# Patient Record
Sex: Female | Born: 1935 | Race: White | Hispanic: No | Smoking: Former smoker
Health system: Southern US, Community
[De-identification: ages and names within clinical notes are randomized; demographics above are authoritative.]

## PROBLEM LIST (undated history)

## (undated) DIAGNOSIS — E785 Hyperlipidemia, unspecified: Secondary | ICD-10-CM

## (undated) DIAGNOSIS — F419 Anxiety disorder, unspecified: Secondary | ICD-10-CM

## (undated) DIAGNOSIS — I10 Essential (primary) hypertension: Secondary | ICD-10-CM

## (undated) DIAGNOSIS — G8929 Other chronic pain: Secondary | ICD-10-CM

## (undated) DIAGNOSIS — E669 Obesity, unspecified: Secondary | ICD-10-CM

## (undated) DIAGNOSIS — K259 Gastric ulcer, unspecified as acute or chronic, without hemorrhage or perforation: Secondary | ICD-10-CM

## (undated) DIAGNOSIS — F329 Major depressive disorder, single episode, unspecified: Secondary | ICD-10-CM

## (undated) DIAGNOSIS — F32A Depression, unspecified: Secondary | ICD-10-CM

## (undated) DIAGNOSIS — F0390 Unspecified dementia without behavioral disturbance: Secondary | ICD-10-CM

## (undated) HISTORY — DX: Obesity, unspecified: E66.9

## (undated) HISTORY — DX: Major depressive disorder, single episode, unspecified: F32.9

## (undated) HISTORY — PX: OTHER SURGICAL HISTORY: SHX169

## (undated) HISTORY — PX: BILATERAL OOPHORECTOMY: SHX1221

## (undated) HISTORY — DX: Anxiety disorder, unspecified: F41.9

## (undated) HISTORY — DX: Depression, unspecified: F32.A

## (undated) HISTORY — DX: Essential (primary) hypertension: I10

## (undated) HISTORY — DX: Hyperlipidemia, unspecified: E78.5

## (undated) HISTORY — PX: ABDOMINAL HYSTERECTOMY: SHX81

---

## 1998-07-19 ENCOUNTER — Other Ambulatory Visit: Admission: RE | Admit: 1998-07-19 | Discharge: 1998-07-19 | Payer: Self-pay | Admitting: Obstetrics and Gynecology

## 1999-07-16 ENCOUNTER — Other Ambulatory Visit: Admission: RE | Admit: 1999-07-16 | Discharge: 1999-07-16 | Payer: Self-pay | Admitting: Obstetrics and Gynecology

## 2000-07-08 ENCOUNTER — Ambulatory Visit (HOSPITAL_COMMUNITY): Admission: RE | Admit: 2000-07-08 | Discharge: 2000-07-08 | Payer: Self-pay | Admitting: Gastroenterology

## 2000-07-08 ENCOUNTER — Encounter (INDEPENDENT_AMBULATORY_CARE_PROVIDER_SITE_OTHER): Payer: Self-pay | Admitting: Specialist

## 2000-09-08 ENCOUNTER — Other Ambulatory Visit: Admission: RE | Admit: 2000-09-08 | Discharge: 2000-09-08 | Payer: Self-pay | Admitting: Obstetrics and Gynecology

## 2001-09-13 ENCOUNTER — Other Ambulatory Visit: Admission: RE | Admit: 2001-09-13 | Discharge: 2001-09-13 | Payer: Self-pay | Admitting: Obstetrics and Gynecology

## 2001-11-10 ENCOUNTER — Ambulatory Visit (HOSPITAL_COMMUNITY): Admission: RE | Admit: 2001-11-10 | Discharge: 2001-11-10 | Payer: Self-pay | Admitting: Gastroenterology

## 2001-11-10 ENCOUNTER — Encounter (INDEPENDENT_AMBULATORY_CARE_PROVIDER_SITE_OTHER): Payer: Self-pay | Admitting: Specialist

## 2001-12-15 ENCOUNTER — Ambulatory Visit: Admission: RE | Admit: 2001-12-15 | Discharge: 2001-12-15 | Payer: Self-pay | Admitting: Gynecology

## 2002-11-01 ENCOUNTER — Other Ambulatory Visit: Admission: RE | Admit: 2002-11-01 | Discharge: 2002-11-01 | Payer: Self-pay | Admitting: Obstetrics and Gynecology

## 2003-07-04 ENCOUNTER — Encounter (INDEPENDENT_AMBULATORY_CARE_PROVIDER_SITE_OTHER): Payer: Self-pay

## 2003-07-04 ENCOUNTER — Ambulatory Visit (HOSPITAL_COMMUNITY): Admission: RE | Admit: 2003-07-04 | Discharge: 2003-07-04 | Payer: Self-pay | Admitting: Gastroenterology

## 2004-07-10 ENCOUNTER — Ambulatory Visit: Payer: Self-pay | Admitting: Internal Medicine

## 2004-07-18 ENCOUNTER — Ambulatory Visit: Payer: Self-pay | Admitting: Internal Medicine

## 2004-09-03 ENCOUNTER — Ambulatory Visit: Payer: Self-pay | Admitting: Family Medicine

## 2004-10-18 ENCOUNTER — Ambulatory Visit: Payer: Self-pay | Admitting: Internal Medicine

## 2004-11-08 ENCOUNTER — Ambulatory Visit: Payer: Self-pay | Admitting: Internal Medicine

## 2004-11-11 ENCOUNTER — Ambulatory Visit: Payer: Self-pay | Admitting: Internal Medicine

## 2004-12-04 ENCOUNTER — Ambulatory Visit (HOSPITAL_COMMUNITY): Admission: RE | Admit: 2004-12-04 | Discharge: 2004-12-04 | Payer: Self-pay | Admitting: Gastroenterology

## 2004-12-04 ENCOUNTER — Encounter (INDEPENDENT_AMBULATORY_CARE_PROVIDER_SITE_OTHER): Payer: Self-pay | Admitting: Specialist

## 2005-02-11 ENCOUNTER — Ambulatory Visit: Payer: Self-pay | Admitting: Internal Medicine

## 2005-04-01 ENCOUNTER — Ambulatory Visit: Payer: Self-pay | Admitting: Internal Medicine

## 2005-07-03 ENCOUNTER — Ambulatory Visit: Payer: Self-pay | Admitting: Internal Medicine

## 2005-07-11 ENCOUNTER — Ambulatory Visit: Payer: Self-pay | Admitting: Internal Medicine

## 2005-07-31 ENCOUNTER — Ambulatory Visit: Payer: Self-pay | Admitting: Internal Medicine

## 2005-08-11 ENCOUNTER — Ambulatory Visit: Payer: Self-pay | Admitting: Internal Medicine

## 2005-09-08 ENCOUNTER — Encounter (HOSPITAL_COMMUNITY): Admission: RE | Admit: 2005-09-08 | Discharge: 2005-12-07 | Payer: Self-pay | Admitting: Internal Medicine

## 2005-10-06 ENCOUNTER — Ambulatory Visit: Payer: Self-pay | Admitting: Internal Medicine

## 2005-10-10 ENCOUNTER — Ambulatory Visit: Payer: Self-pay | Admitting: Internal Medicine

## 2005-11-14 ENCOUNTER — Ambulatory Visit: Payer: Self-pay | Admitting: Internal Medicine

## 2006-01-08 ENCOUNTER — Ambulatory Visit: Payer: Self-pay | Admitting: Internal Medicine

## 2006-01-14 ENCOUNTER — Ambulatory Visit: Payer: Self-pay | Admitting: Internal Medicine

## 2006-02-02 ENCOUNTER — Ambulatory Visit (HOSPITAL_COMMUNITY): Admission: RE | Admit: 2006-02-02 | Discharge: 2006-02-03 | Payer: Self-pay | Admitting: Surgery

## 2006-02-02 ENCOUNTER — Encounter (INDEPENDENT_AMBULATORY_CARE_PROVIDER_SITE_OTHER): Payer: Self-pay | Admitting: Specialist

## 2006-02-16 ENCOUNTER — Ambulatory Visit: Payer: Self-pay | Admitting: Internal Medicine

## 2006-04-08 ENCOUNTER — Ambulatory Visit: Payer: Self-pay | Admitting: Internal Medicine

## 2006-06-03 ENCOUNTER — Ambulatory Visit: Payer: Self-pay | Admitting: Internal Medicine

## 2006-06-10 ENCOUNTER — Ambulatory Visit: Payer: Self-pay | Admitting: Internal Medicine

## 2006-06-10 LAB — CONVERTED CEMR LAB: TSH: 1.16 microintl units/mL (ref 0.35–5.50)

## 2006-08-10 ENCOUNTER — Ambulatory Visit: Payer: Self-pay | Admitting: Internal Medicine

## 2006-08-10 LAB — CONVERTED CEMR LAB
ALT: 17 units/L (ref 0–40)
AST: 21 units/L (ref 0–37)
Albumin: 4.1 g/dL (ref 3.5–5.2)
BUN: 15 mg/dL (ref 6–23)
Basophils Relative: 0.1 % (ref 0.0–1.0)
CO2: 30 meq/L (ref 19–32)
Calcium: 10.2 mg/dL (ref 8.4–10.5)
Eosinophil percent: 1.4 % (ref 0.0–5.0)
Glomerular Filtration Rate, Af Am: 70 mL/min/{1.73_m2}
HCT: 46.1 % — ABNORMAL HIGH (ref 36.0–46.0)
Hemoglobin: 15.3 g/dL — ABNORMAL HIGH (ref 12.0–15.0)
Lymphocytes Relative: 26.7 % (ref 12.0–46.0)
MCV: 89.1 fL (ref 78.0–100.0)
Monocytes Relative: 8.2 % (ref 3.0–11.0)
Neutrophils Relative %: 63.6 % (ref 43.0–77.0)
RBC: 5.17 M/uL — ABNORMAL HIGH (ref 3.87–5.11)
RDW: 12.6 % (ref 11.5–14.6)
WBC: 6.8 10*3/uL (ref 4.5–10.5)

## 2006-08-17 ENCOUNTER — Ambulatory Visit: Payer: Self-pay | Admitting: Internal Medicine

## 2006-10-22 ENCOUNTER — Ambulatory Visit: Payer: Self-pay | Admitting: Internal Medicine

## 2006-10-24 ENCOUNTER — Ambulatory Visit: Payer: Self-pay | Admitting: Internal Medicine

## 2006-10-29 ENCOUNTER — Ambulatory Visit: Payer: Self-pay | Admitting: Internal Medicine

## 2006-12-01 ENCOUNTER — Ambulatory Visit: Payer: Self-pay | Admitting: Internal Medicine

## 2007-02-02 ENCOUNTER — Ambulatory Visit: Payer: Self-pay | Admitting: Internal Medicine

## 2007-02-23 DIAGNOSIS — E785 Hyperlipidemia, unspecified: Secondary | ICD-10-CM | POA: Insufficient documentation

## 2007-03-04 ENCOUNTER — Ambulatory Visit: Payer: Self-pay | Admitting: Internal Medicine

## 2007-03-11 ENCOUNTER — Encounter: Admission: RE | Admit: 2007-03-11 | Discharge: 2007-03-11 | Payer: Self-pay | Admitting: Obstetrics and Gynecology

## 2007-05-04 ENCOUNTER — Ambulatory Visit: Payer: Self-pay | Admitting: Internal Medicine

## 2007-05-04 LAB — CONVERTED CEMR LAB
AST: 21 units/L (ref 0–37)
Albumin: 3.8 g/dL (ref 3.5–5.2)
Alkaline Phosphatase: 59 units/L (ref 39–117)
Cholesterol: 274 mg/dL (ref 0–200)
Total Bilirubin: 0.9 mg/dL (ref 0.3–1.2)
Total CHOL/HDL Ratio: 5.2
VLDL: 24 mg/dL (ref 0–40)

## 2007-05-10 ENCOUNTER — Ambulatory Visit: Payer: Self-pay | Admitting: Internal Medicine

## 2007-05-10 DIAGNOSIS — I10 Essential (primary) hypertension: Secondary | ICD-10-CM | POA: Insufficient documentation

## 2007-05-10 DIAGNOSIS — T887XXA Unspecified adverse effect of drug or medicament, initial encounter: Secondary | ICD-10-CM

## 2007-05-11 ENCOUNTER — Ambulatory Visit: Admission: RE | Admit: 2007-05-11 | Discharge: 2007-05-11 | Payer: Self-pay | Admitting: Gynecology

## 2007-07-15 ENCOUNTER — Ambulatory Visit: Payer: Self-pay | Admitting: Internal Medicine

## 2007-07-15 LAB — CONVERTED CEMR LAB
ALT: 17 units/L (ref 0–35)
AST: 21 units/L (ref 0–37)
Albumin: 4.2 g/dL (ref 3.5–5.2)
VLDL: 34 mg/dL (ref 0–40)

## 2007-07-21 ENCOUNTER — Telehealth: Payer: Self-pay | Admitting: Internal Medicine

## 2007-07-22 ENCOUNTER — Ambulatory Visit: Payer: Self-pay | Admitting: Internal Medicine

## 2007-08-03 ENCOUNTER — Encounter: Payer: Self-pay | Admitting: Internal Medicine

## 2007-08-05 ENCOUNTER — Telehealth: Payer: Self-pay | Admitting: Internal Medicine

## 2007-08-18 ENCOUNTER — Telehealth: Payer: Self-pay | Admitting: Internal Medicine

## 2007-08-18 DIAGNOSIS — Z780 Asymptomatic menopausal state: Secondary | ICD-10-CM | POA: Insufficient documentation

## 2007-08-25 ENCOUNTER — Encounter: Payer: Self-pay | Admitting: Internal Medicine

## 2007-09-28 ENCOUNTER — Telehealth: Payer: Self-pay | Admitting: Internal Medicine

## 2007-10-15 ENCOUNTER — Ambulatory Visit: Payer: Self-pay | Admitting: Internal Medicine

## 2007-10-15 LAB — CONVERTED CEMR LAB
ALT: 25 units/L (ref 0–35)
Albumin: 4.4 g/dL (ref 3.5–5.2)
BUN: 12 mg/dL (ref 6–23)
Basophils Relative: 0.2 % (ref 0.0–1.0)
Blood in Urine, dipstick: NEGATIVE
Cholesterol: 221 mg/dL (ref 0–200)
Creatinine, Ser: 0.9 mg/dL (ref 0.4–1.2)
Eosinophils Relative: 2.7 % (ref 0.0–5.0)
GFR calc non Af Amer: 65 mL/min
Glucose, Bld: 98 mg/dL (ref 70–99)
Glucose, Urine, Semiquant: NEGATIVE
HCT: 47.4 % — ABNORMAL HIGH (ref 36.0–46.0)
HDL: 66.7 mg/dL (ref >39.0)
MCV: 88.7 fL (ref 78.0–100.0)
Monocytes Absolute: 0.5 10*3/uL (ref 0.2–0.7)
Neutro Abs: 4.8 10*3/uL (ref 1.4–7.7)
Neutrophils Relative %: 64.8 % (ref 43.0–77.0)
Nitrite: NEGATIVE
Protein, U semiquant: NEGATIVE
RBC: 5.34 M/uL — ABNORMAL HIGH (ref 3.87–5.11)
Sodium: 145 meq/L (ref 135–145)
VLDL: 31 mg/dL (ref 0–40)
WBC Urine, dipstick: NEGATIVE
WBC: 7.4 10*3/uL (ref 4.5–10.5)

## 2007-10-22 ENCOUNTER — Ambulatory Visit: Payer: Self-pay | Admitting: Internal Medicine

## 2007-10-22 LAB — CONVERTED CEMR LAB
Cholesterol, target level: 200 mg/dL
HDL goal, serum: 40 mg/dL
LDL Goal: 160 mg/dL

## 2007-11-16 ENCOUNTER — Telehealth: Payer: Self-pay | Admitting: Internal Medicine

## 2007-11-16 ENCOUNTER — Ambulatory Visit: Admission: RE | Admit: 2007-11-16 | Discharge: 2007-11-16 | Payer: Self-pay | Admitting: Gynecology

## 2007-11-23 ENCOUNTER — Ambulatory Visit: Payer: Self-pay | Admitting: Internal Medicine

## 2007-11-23 DIAGNOSIS — K120 Recurrent oral aphthae: Secondary | ICD-10-CM

## 2007-11-23 DIAGNOSIS — E538 Deficiency of other specified B group vitamins: Secondary | ICD-10-CM | POA: Insufficient documentation

## 2007-11-29 ENCOUNTER — Encounter: Admission: RE | Admit: 2007-11-29 | Discharge: 2007-11-29 | Payer: Self-pay | Admitting: Gynecology

## 2007-12-01 ENCOUNTER — Encounter: Payer: Self-pay | Admitting: Gynecology

## 2007-12-01 ENCOUNTER — Ambulatory Visit (HOSPITAL_BASED_OUTPATIENT_CLINIC_OR_DEPARTMENT_OTHER): Admission: RE | Admit: 2007-12-01 | Discharge: 2007-12-01 | Payer: Self-pay | Admitting: Gynecology

## 2007-12-29 ENCOUNTER — Telehealth: Payer: Self-pay | Admitting: Internal Medicine

## 2007-12-30 DIAGNOSIS — R040 Epistaxis: Secondary | ICD-10-CM

## 2008-01-05 ENCOUNTER — Ambulatory Visit: Admission: RE | Admit: 2008-01-05 | Discharge: 2008-01-05 | Payer: Self-pay | Admitting: Gynecology

## 2008-01-20 ENCOUNTER — Ambulatory Visit: Payer: Self-pay | Admitting: Internal Medicine

## 2008-01-20 LAB — CONVERTED CEMR LAB
CO2: 29 meq/L (ref 19–32)
Creatinine, Ser: 0.8 mg/dL (ref 0.4–1.2)
GFR calc non Af Amer: 74 mL/min
Potassium: 4.3 meq/L (ref 3.5–5.1)

## 2008-01-21 ENCOUNTER — Encounter: Payer: Self-pay | Admitting: Internal Medicine

## 2008-01-21 LAB — CONVERTED CEMR LAB
Albumin ELP: 62.4 % (ref 55.8–66.1)
Alpha-1-Globulin: 4 % (ref 2.9–4.9)
Alpha-2-Globulin: 8.6 % (ref 7.1–11.8)
Gamma Globulin: 13.7 % (ref 11.1–18.8)

## 2008-01-27 ENCOUNTER — Telehealth (INDEPENDENT_AMBULATORY_CARE_PROVIDER_SITE_OTHER): Payer: Self-pay | Admitting: *Deleted

## 2008-03-14 ENCOUNTER — Ambulatory Visit: Payer: Self-pay | Admitting: Internal Medicine

## 2008-03-14 LAB — CONVERTED CEMR LAB
ALT: 19 units/L (ref 0–35)
Albumin: 4.1 g/dL (ref 3.5–5.2)
Cholesterol: 298 mg/dL (ref 0–200)
HDL: 51 mg/dL (ref >39.0)
Total CHOL/HDL Ratio: 5.8
Total Protein: 6.8 g/dL (ref 6.0–8.3)

## 2008-03-16 ENCOUNTER — Telehealth: Payer: Self-pay | Admitting: Internal Medicine

## 2008-03-21 ENCOUNTER — Ambulatory Visit: Payer: Self-pay | Admitting: Internal Medicine

## 2008-05-05 ENCOUNTER — Ambulatory Visit: Payer: Self-pay | Admitting: Internal Medicine

## 2008-05-05 DIAGNOSIS — S2000XA Contusion of breast, unspecified breast, initial encounter: Secondary | ICD-10-CM | POA: Insufficient documentation

## 2008-05-16 ENCOUNTER — Ambulatory Visit: Payer: Self-pay | Admitting: Internal Medicine

## 2008-05-16 DIAGNOSIS — R000 Tachycardia, unspecified: Secondary | ICD-10-CM

## 2008-05-17 ENCOUNTER — Telehealth: Payer: Self-pay | Admitting: Internal Medicine

## 2008-06-02 ENCOUNTER — Ambulatory Visit: Payer: Self-pay | Admitting: Internal Medicine

## 2008-06-16 ENCOUNTER — Ambulatory Visit: Payer: Self-pay | Admitting: Internal Medicine

## 2008-06-16 LAB — CONVERTED CEMR LAB
ALT: 19 units/L (ref 0–35)
AST: 21 units/L (ref 0–37)
Alkaline Phosphatase: 71 units/L (ref 39–117)
Cholesterol: 331 mg/dL (ref 0–200)
Direct LDL: 205.6 mg/dL
Total Bilirubin: 1.1 mg/dL (ref 0.3–1.2)
Triglycerides: 272 mg/dL (ref 0–149)

## 2008-06-23 ENCOUNTER — Ambulatory Visit: Payer: Self-pay | Admitting: Internal Medicine

## 2008-07-21 LAB — CONVERTED CEMR LAB: Pap Smear: NORMAL

## 2008-08-28 ENCOUNTER — Encounter: Payer: Self-pay | Admitting: Internal Medicine

## 2008-10-05 ENCOUNTER — Ambulatory Visit: Payer: Self-pay | Admitting: Internal Medicine

## 2008-10-05 LAB — CONVERTED CEMR LAB
ALT: 20 units/L (ref 0–35)
AST: 24 units/L (ref 0–37)
Bilirubin, Direct: 0.1 mg/dL (ref 0.0–0.3)
Chloride: 110 meq/L (ref 96–112)
Cholesterol: 151 mg/dL (ref 0–200)
Creatinine, Ser: 0.9 mg/dL (ref 0.4–1.2)
LDL Cholesterol: 76 mg/dL (ref 0–99)
Potassium: 4.5 meq/L (ref 3.5–5.1)
Sodium: 144 meq/L (ref 135–145)
Total Bilirubin: 0.8 mg/dL (ref 0.3–1.2)
Total Protein: 7.2 g/dL (ref 6.0–8.3)
VLDL: 18 mg/dL (ref 0–40)

## 2008-10-12 ENCOUNTER — Ambulatory Visit: Payer: Self-pay | Admitting: Internal Medicine

## 2008-10-16 ENCOUNTER — Telehealth: Payer: Self-pay | Admitting: Internal Medicine

## 2008-11-09 ENCOUNTER — Telehealth: Payer: Self-pay | Admitting: Internal Medicine

## 2008-11-17 ENCOUNTER — Ambulatory Visit: Payer: Self-pay | Admitting: Internal Medicine

## 2008-11-17 ENCOUNTER — Telehealth: Payer: Self-pay | Admitting: Internal Medicine

## 2008-11-17 DIAGNOSIS — J3089 Other allergic rhinitis: Secondary | ICD-10-CM

## 2008-11-20 ENCOUNTER — Encounter: Payer: Self-pay | Admitting: Internal Medicine

## 2009-02-19 ENCOUNTER — Ambulatory Visit: Payer: Self-pay | Admitting: Internal Medicine

## 2009-02-19 LAB — CONVERTED CEMR LAB
AST: 22 units/L (ref 0–37)
Albumin: 4 g/dL (ref 3.5–5.2)
Alkaline Phosphatase: 65 units/L (ref 39–117)
BUN: 16 mg/dL (ref 6–23)
Basophils Absolute: 0 10*3/uL (ref 0.0–0.1)
Basophils Relative: 0 % (ref 0.0–3.0)
Calcium: 9.6 mg/dL (ref 8.4–10.5)
Chloride: 105 meq/L (ref 96–112)
Creatinine, Ser: 0.7 mg/dL (ref 0.4–1.2)
Eosinophils Relative: 2.7 % (ref 0.0–5.0)
GFR calc non Af Amer: 86.17 mL/min (ref >60)
Glucose, Bld: 102 mg/dL — ABNORMAL HIGH (ref 70–99)
HDL: 50.6 mg/dL (ref >39.00)
Lymphocytes Relative: 25.4 % (ref 12.0–46.0)
Lymphs Abs: 1.7 10*3/uL (ref 0.7–4.0)
Monocytes Relative: 8 % (ref 3.0–12.0)
Neutro Abs: 4.2 10*3/uL (ref 1.4–7.7)
Neutrophils Relative %: 63.9 % (ref 43.0–77.0)
Platelets: 257 10*3/uL (ref 150.0–400.0)
Sodium: 145 meq/L (ref 135–145)
Total CHOL/HDL Ratio: 6
VLDL: 43.6 mg/dL — ABNORMAL HIGH (ref 0.0–40.0)
WBC: 6.6 10*3/uL (ref 4.5–10.5)

## 2009-06-04 ENCOUNTER — Ambulatory Visit: Payer: Self-pay | Admitting: Internal Medicine

## 2009-06-04 LAB — CONVERTED CEMR LAB
ALT: 16 units/L (ref 0–35)
Albumin: 3.8 g/dL (ref 3.5–5.2)
Alkaline Phosphatase: 62 units/L (ref 39–117)
Bilirubin, Direct: 0 mg/dL (ref 0.0–0.3)
CO2: 31 meq/L (ref 19–32)
Calcium: 9.7 mg/dL (ref 8.4–10.5)
Cholesterol: 284 mg/dL — ABNORMAL HIGH (ref 0–200)
Creatinine, Ser: 0.8 mg/dL (ref 0.4–1.2)
Direct LDL: 201.3 mg/dL
Glucose, Bld: 91 mg/dL (ref 70–99)
HDL: 44 mg/dL (ref >39.00)
Potassium: 4.1 meq/L (ref 3.5–5.1)

## 2009-06-11 ENCOUNTER — Ambulatory Visit: Payer: Self-pay | Admitting: Internal Medicine

## 2009-06-20 ENCOUNTER — Ambulatory Visit: Payer: Self-pay | Admitting: Internal Medicine

## 2009-06-20 ENCOUNTER — Encounter: Payer: Self-pay | Admitting: Internal Medicine

## 2009-06-20 DIAGNOSIS — L723 Sebaceous cyst: Secondary | ICD-10-CM

## 2009-06-20 DIAGNOSIS — F411 Generalized anxiety disorder: Secondary | ICD-10-CM | POA: Insufficient documentation

## 2009-07-23 ENCOUNTER — Telehealth: Payer: Self-pay | Admitting: Internal Medicine

## 2009-09-18 ENCOUNTER — Ambulatory Visit: Payer: Self-pay | Admitting: Internal Medicine

## 2009-09-18 LAB — CONVERTED CEMR LAB
Alkaline Phosphatase: 63 units/L (ref 39–117)
Bilirubin, Direct: 0.1 mg/dL (ref 0.0–0.3)
HDL: 53.7 mg/dL (ref >39.00)
Total Bilirubin: 0.8 mg/dL (ref 0.3–1.2)
Triglycerides: 202 mg/dL — ABNORMAL HIGH (ref 0.0–149.0)
VLDL: 40.4 mg/dL — ABNORMAL HIGH (ref 0.0–40.0)

## 2009-09-25 ENCOUNTER — Ambulatory Visit: Payer: Self-pay | Admitting: Internal Medicine

## 2009-10-23 ENCOUNTER — Encounter: Payer: Self-pay | Admitting: Internal Medicine

## 2009-11-30 ENCOUNTER — Ambulatory Visit: Payer: Self-pay | Admitting: Internal Medicine

## 2009-11-30 LAB — CONVERTED CEMR LAB
Bilirubin, Direct: 0 mg/dL (ref 0.0–0.3)
Direct LDL: 211.4 mg/dL
HDL: 58.4 mg/dL (ref >39.00)
Total Bilirubin: 0.7 mg/dL (ref 0.3–1.2)
Total CHOL/HDL Ratio: 5
Triglycerides: 199 mg/dL — ABNORMAL HIGH (ref 0.0–149.0)

## 2009-12-11 ENCOUNTER — Encounter: Payer: Self-pay | Admitting: Internal Medicine

## 2009-12-18 ENCOUNTER — Telehealth: Payer: Self-pay | Admitting: Internal Medicine

## 2009-12-18 ENCOUNTER — Ambulatory Visit: Payer: Self-pay | Admitting: Internal Medicine

## 2010-02-25 ENCOUNTER — Ambulatory Visit: Payer: Self-pay | Admitting: Family Medicine

## 2010-02-25 ENCOUNTER — Telehealth: Payer: Self-pay | Admitting: Internal Medicine

## 2010-02-25 DIAGNOSIS — S139XXA Sprain of joints and ligaments of unspecified parts of neck, initial encounter: Secondary | ICD-10-CM | POA: Insufficient documentation

## 2010-02-28 ENCOUNTER — Telehealth: Payer: Self-pay | Admitting: Family Medicine

## 2010-03-01 ENCOUNTER — Ambulatory Visit: Payer: Self-pay

## 2010-03-01 ENCOUNTER — Ambulatory Visit: Payer: Self-pay | Admitting: Family Medicine

## 2010-03-01 DIAGNOSIS — M79609 Pain in unspecified limb: Secondary | ICD-10-CM

## 2010-03-26 ENCOUNTER — Telehealth: Payer: Self-pay | Admitting: *Deleted

## 2010-04-10 ENCOUNTER — Ambulatory Visit: Payer: Self-pay | Admitting: Internal Medicine

## 2010-04-10 DIAGNOSIS — M1710 Unilateral primary osteoarthritis, unspecified knee: Secondary | ICD-10-CM | POA: Insufficient documentation

## 2010-04-15 ENCOUNTER — Telehealth: Payer: Self-pay | Admitting: Internal Medicine

## 2010-04-15 ENCOUNTER — Ambulatory Visit: Payer: Self-pay | Admitting: Internal Medicine

## 2010-04-15 DIAGNOSIS — M25519 Pain in unspecified shoulder: Secondary | ICD-10-CM

## 2010-04-17 ENCOUNTER — Telehealth (INDEPENDENT_AMBULATORY_CARE_PROVIDER_SITE_OTHER): Payer: Self-pay | Admitting: *Deleted

## 2010-05-15 ENCOUNTER — Ambulatory Visit: Payer: Self-pay | Admitting: Internal Medicine

## 2010-05-15 LAB — CONVERTED CEMR LAB
ALT: 15 units/L (ref 0–35)
Basophils Absolute: 0 10*3/uL (ref 0.0–0.1)
Bilirubin Urine: NEGATIVE
CO2: 29 meq/L (ref 19–32)
Chloride: 107 meq/L (ref 96–112)
GFR calc non Af Amer: 66.83 mL/min (ref >60)
Glucose, Urine, Semiquant: NEGATIVE
HCT: 37.9 % (ref 36.0–46.0)
Hemoglobin: 12.9 g/dL (ref 12.0–15.0)
Ketones, urine, test strip: NEGATIVE
MCHC: 34 g/dL (ref 30.0–36.0)
MCV: 84.7 fL (ref 78.0–100.0)
Monocytes Relative: 8.4 % (ref 3.0–12.0)
Neutro Abs: 3.3 10*3/uL (ref 1.4–7.7)
Neutrophils Relative %: 59.7 % (ref 43.0–77.0)
Nitrite: NEGATIVE
Potassium: 4.6 meq/L (ref 3.5–5.1)
Protein, U semiquant: NEGATIVE
RDW: 16.1 % — ABNORMAL HIGH (ref 11.5–14.6)
Sodium: 143 meq/L (ref 135–145)
Total Bilirubin: 0.6 mg/dL (ref 0.3–1.2)
Total CHOL/HDL Ratio: 6
Total Protein: 6.2 g/dL (ref 6.0–8.3)
Urobilinogen, UA: 0.2
VLDL: 45 mg/dL — ABNORMAL HIGH (ref 0.0–40.0)
pH: 7.5

## 2010-05-22 ENCOUNTER — Ambulatory Visit: Payer: Self-pay | Admitting: Internal Medicine

## 2010-05-27 ENCOUNTER — Encounter: Payer: Self-pay | Admitting: Internal Medicine

## 2010-05-30 ENCOUNTER — Encounter: Payer: Self-pay | Admitting: Internal Medicine

## 2010-06-24 ENCOUNTER — Encounter: Payer: Self-pay | Admitting: Internal Medicine

## 2010-07-15 ENCOUNTER — Ambulatory Visit: Payer: Self-pay | Admitting: Internal Medicine

## 2010-07-15 LAB — CONVERTED CEMR LAB
ALT: 16 units/L (ref 0–35)
Albumin: 4.1 g/dL (ref 3.5–5.2)
Alkaline Phosphatase: 56 units/L (ref 39–117)
HDL: 50.7 mg/dL (ref >39.00)
Total Bilirubin: 0.5 mg/dL (ref 0.3–1.2)
VLDL: 29.6 mg/dL (ref 0.0–40.0)

## 2010-07-24 ENCOUNTER — Ambulatory Visit: Payer: Self-pay | Admitting: Internal Medicine

## 2010-08-19 ENCOUNTER — Encounter: Payer: Self-pay | Admitting: Internal Medicine

## 2010-10-01 NOTE — Miscellaneous (Signed)
Summary: Progress Note/[facility]  Progress Note/ Physical Therapy   Imported By: Maryln Gottron 06/12/2010 14:14:34  _____________________________________________________________________  External Attachment:    Type:   Image     Comment:   External Document
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
## 2010-10-01 NOTE — Progress Notes (Signed)
  Phone Note Call from Patient   Reason for Call: Acute Illness Summary of Call: pt called stating she could not afford welchol >200  dollars- wants to continue on the livalo and take as often as possible-left message on machine for pt-this is ok per dr Lovell Sheehan Initial call taken by: Willy Eddy, LPN,  December 18, 2009 5:27 PM

## 2010-10-01 NOTE — Letter (Signed)
Summary: [facility]  [facility]   Imported By: Maryln Gottron 05/30/2010 14:49:11  _____________________________________________________________________  External Attachment:    Type:   Image     Comment:   External Document  Appended Document: [facility]

## 2010-10-01 NOTE — Assessment & Plan Note (Signed)
Summary: 3 month rov/njr   Vital Signs:  Patient profile:   77 year old female Height:      63 inches Weight:      166 pounds BMI:     29.51 Temp:     98.2 degrees F oral Pulse rate:   72 / minute Resp:     14 per minute BP sitting:   140 / 80  (left arm)  Vitals Entered By: Willy Eddy, LPN (September 25, 2009 9:20 AM) CC: roas labs-time for bone density   CC:  roas labs-time for bone density.  History of Present Illness: allergies are increased non compliant with livalo  Hyperlipidemia Follow-Up      This is a 77 year old woman who presents for Hyperlipidemia follow-up.  The patient denies muscle aches, GI upset, abdominal pain, flushing, itching, constipation, diarrhea, and fatigue.  The patient denies the following symptoms: chest pain/pressure, exercise intolerance, dypsnea, palpitations, syncope, and pedal edema.  Compliance with medications (by patient report) has been poor.  Dietary compliance has been fair.  The patient reports exercising occasionally.  Adjunctive measures currently used by the patient include fish oil supplements.    Hypertension Follow-Up      The patient also presents for Hypertension follow-up.  The patient denies lightheadedness, urinary frequency, headaches, edema, impotence, rash, and fatigue.  Compliance with medications (by patient report) has been near 100%.  The patient reports that dietary compliance has been fair.  The patient reports exercising occasionally.    Preventive Screening-Counseling & Management  Alcohol-Tobacco     Smoking Status: never  Problems Prior to Update: 1)  Anxiety State, Unspecified  (ICD-300.00) 2)  Sebaceous Cyst, Infected  (ICD-706.2) 3)  Allergic Rhinitis Due To Other Allergen  (ICD-477.8) 4)  Tachycardia  (ICD-785.0) 5)  Contusion of Breast  (ICD-922.0) 6)  Hypercalcemia  (ICD-275.42) 7)  Epistaxis  (ICD-784.7) 8)  Vitamin B12 Deficiency  (ICD-266.2) 9)  Aphthous Ulcers  (ICD-528.2) 10)  Preventive  Health Care  (ICD-V70.0) 11)  Asymptomatic Postmenopausal Status  (ICD-V49.81) 12)  Hypertension  (ICD-401.9) 13)  Advef, Drug/medicinal/biological Subst Nos  (ICD-995.20) 14)  Hyperlipidemia  (ICD-272.4)  Medications Prior to Update: 1)  Aspirin 81 Mg  Tabs (Aspirin) .... Once Daily 2)  Vitamin B-12 500 Mcg  Tabs (Cyanocobalamin) .... Once Daily 3)  Vitamin D 1000 Unit Caps (Cholecalciferol) .... Once Daily 4)  Livalo 4 Mg Tabs (Pitavastatin Calcium) .... One By Mouth  Every Sunday Night ( First Pill Tonight) 5)  Benazepril Hcl 40 Mg  Tabs (Benazepril Hcl) .... Once Daily 6)  Bisoprolol Fumarate 5 Mg Tabs (Bisoprolol Fumarate) .... 1/2 By Mouth Daily 7)  Cephalexin 500 Mg Caps (Cephalexin) .... One By Mouth  Qid For 7 Days  Current Medications (verified): 1)  Aspirin 81 Mg  Tabs (Aspirin) .... Once Daily 2)  Vitamin B-12 500 Mcg  Tabs (Cyanocobalamin) .... Once Daily 3)  Vitamin D 1000 Unit Caps (Cholecalciferol) .... Once Daily 4)  Livalo 4 Mg Tabs (Pitavastatin Calcium) .... One By Mouth  Every Sunday Night ( First Pill Tonight) 5)  Benazepril Hcl 40 Mg  Tabs (Benazepril Hcl) .... Once Daily 6)  Bisoprolol Fumarate 5 Mg Tabs (Bisoprolol Fumarate) .... 1/2 By Mouth Daily 7)  Magnesium 500 Mg Tabs (Magnesium) .Marland Kitchen.. 1 Once Daily  Allergies (verified): No Known Drug Allergies  Past History:  Family History: Last updated: 10/22/2007 Family History High cholesterol Family History Hypertension  Social History: Last updated: 10/22/2007 Never Smoked Single  widowed  Risk Factors: Smoking Status: never (09/25/2009)  Past medical, surgical, family and social histories (including risk factors) reviewed, and no changes noted (except as noted below).  Past Medical History: Reviewed history from 05/10/2007 and no changes required. Hyperlipidemia Hypertension  Past Surgical History: Reviewed history from 06/20/2009 and no changes required. BCCA of nose  Family  History: Reviewed history from 10/22/2007 and no changes required. Family History High cholesterol Family History Hypertension  Social History: Reviewed history from 10/22/2007 and no changes required. Never Smoked Single  widowed  Review of Systems  The patient denies anorexia, fever, weight loss, weight gain, vision loss, decreased hearing, hoarseness, chest pain, syncope, dyspnea on exertion, peripheral edema, prolonged cough, headaches, hemoptysis, abdominal pain, melena, hematochezia, severe indigestion/heartburn, hematuria, incontinence, genital sores, muscle weakness, suspicious skin lesions, transient blindness, difficulty walking, depression, unusual weight change, abnormal bleeding, enlarged lymph nodes, angioedema, and breast masses.    Physical Exam  General:  Well-developed,well-nourished,in no acute distress; alert,appropriate and cooperative throughout examination Head:  normocephalic and atraumatic.   Eyes:  pupils equal, pupils round, and pupils reactive to light.   Ears:  R ear normal and L ear normal.   Nose:  no nasal discharge.   HAs a cyst on the bridge of nose about 2 cm from the surgical scar Mouth:  posterior lymphoid hypertrophy and postnasal drip.   Neck:  No deformities, masses, or tenderness noted. Lungs:  normal respiratory effort and no wheezes.   Heart:  normal rate and regular rhythm.   Abdomen:  soft, non-tender, and normal bowel sounds.   Extremities:  trace left pedal edema and trace right pedal edema.   Neurologic:  alert & oriented X3.     Impression & Recommendations:  Problem # 1:  HYPERLIPIDEMIA (ICD-272.4) has missed too many doses this hjas a big impact on results Her updated medication list for this problem includes:    Livalo 4 Mg Tabs (Pitavastatin calcium) ..... One by mouth  every sunday night ( first pill tonight)  Labs Reviewed: SGOT: 24 (09/18/2009)   SGPT: 20 (09/18/2009)  Lipid Goals: Chol Goal: 200 (02/19/2009)   HDL  Goal: 40 (02/19/2009)   LDL Goal: 130 (02/19/2009)   TG Goal: 150 (02/19/2009)  Prior 10 Yr Risk Heart Disease: 9 % (02/19/2009)   HDL:53.70 (09/18/2009), 44.00 (06/04/2009)  LDL:76 (10/05/2008), DEL (16/06/9603)  Chol:271 (09/18/2009), 284 (06/04/2009)  Trig:202.0 (09/18/2009), 215.0 (06/04/2009)  Problem # 2:  HYPERTENSION (ICD-401.9)  Her updated medication list for this problem includes:    Benazepril Hcl 40 Mg Tabs (Benazepril hcl) ..... Once daily    Bisoprolol Fumarate 5 Mg Tabs (Bisoprolol fumarate) .Marland Kitchen... 1/2 by mouth daily  BP today: 140/80 Prior BP: 134/80 (06/20/2009)  Prior 10 Yr Risk Heart Disease: 9 % (02/19/2009)  Labs Reviewed: K+: 4.1 (06/04/2009) Creat: : 0.8 (06/04/2009)   Chol: 271 (09/18/2009)   HDL: 53.70 (09/18/2009)   LDL: 76 (10/05/2008)   TG: 202.0 (09/18/2009)  Problem # 3:  ALLERGIC RHINITIS DUE TO OTHER ALLERGEN (ICD-477.8) stable  Complete Medication List: 1)  Aspirin 81 Mg Tabs (Aspirin) .... Two daily 2)  Vitamin B-12 500 Mcg Tabs (Cyanocobalamin) .... Once daily 3)  Vitamin D 1000 Unit Caps (Cholecalciferol) .... Once daily 4)  Livalo 4 Mg Tabs (Pitavastatin calcium) .... One by mouth  every sunday night ( first pill tonight) 5)  Benazepril Hcl 40 Mg Tabs (Benazepril hcl) .... Once daily 6)  Bisoprolol Fumarate 5 Mg Tabs (Bisoprolol fumarate) .... 1/2 by mouth  daily 7)  Magnesium 500 Mg Tabs (Magnesium) .Marland Kitchen.. 1 once daily  Patient Instructions: 1)  MAY cpx   Preventive Care Screening  Bone Density:    Date:  08/18/2007    Next Due:  09/2009    Results:  abnormal std dev   Immunization History:  Influenza Immunization History:    Influenza:  historical (06/08/2009)

## 2010-10-01 NOTE — Progress Notes (Signed)
  Phone Note Call from Patient   Summary of Call: now c/o shoulder pain  New Problems: SHOULDER PAIN, RIGHT (ICD-719.41)   New Problems: SHOULDER PAIN, RIGHT (ICD-719.41)

## 2010-10-01 NOTE — Miscellaneous (Signed)
Summary: Orders Update  Clinical Lists Changes  Orders: Added new Test order of Venous Duplex Lower Extremity (Venous Duplex Lower) - Signed  Appended Document: Orders Update neg doppler study for DVT just a Baker's Cyst which can be painful but not serious. Patient did not answer phone at 5:30pm Friday night. Please notify patient neg for blood clot on Monday  Appended Document: Orders Update Informed pt.

## 2010-10-01 NOTE — Letter (Signed)
Summary: [facility]  [facility]   Imported By: Maryln Gottron 06/27/2010 14:49:43  _____________________________________________________________________  External Attachment:    Type:   Image     Comment:   External Document
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
## 2010-10-01 NOTE — Assessment & Plan Note (Signed)
Summary: 3 month follow up/cjr/cjr   Vital Signs:  Patient profile:   77 year old female Height:      63 inches Weight:      163 pounds BMI:     28.98 Temp:     98.2 degrees F oral Pulse rate:   72 / minute Resp:     14 per minute BP sitting:   140 / 80  (left arm)  Vitals Entered By: Willy Eddy, LPN (December 18, 2009 12:28 PM) CC: roa-labs- c/o livalo causing muscle aches- only takes about 1 a month, Hypertension Management   CC:  roa-labs- c/o livalo causing muscle aches- only takes about 1 a month and Hypertension Management.  History of Present Illness: reviewed the bone density and the manogram as well as noted the ointolewrance of the whole static class now that she has failed the newes drug livalo the pt has  anxiety and she if fearfull of new drug she keeps thinking I am giving her antidepressant I had to expalin the name welchol  Hypertension History:      She denies headache, chest pain, palpitations, dyspnea with exertion, orthopnea, PND, peripheral edema, visual symptoms, neurologic problems, syncope, and side effects from treatment.        Positive major cardiovascular risk factors include female age 6 years old or older, hyperlipidemia, and hypertension.  Negative major cardiovascular risk factors include no history of diabetes, negative family history for ischemic heart disease, and non-tobacco-user status.        Further assessment for target organ damage reveals no history of ASHD, stroke/TIA, or peripheral vascular disease.     Preventive Screening-Counseling & Management  Alcohol-Tobacco     Smoking Status: never  Problems Prior to Update: 1)  Anxiety State, Unspecified  (ICD-300.00) 2)  Sebaceous Cyst, Infected  (ICD-706.2) 3)  Allergic Rhinitis Due To Other Allergen  (ICD-477.8) 4)  Tachycardia  (ICD-785.0) 5)  Contusion of Breast  (ICD-922.0) 6)  Hypercalcemia  (ICD-275.42) 7)  Epistaxis  (ICD-784.7) 8)  Vitamin B12 Deficiency  (ICD-266.2) 9)   Aphthous Ulcers  (ICD-528.2) 10)  Preventive Health Care  (ICD-V70.0) 11)  Asymptomatic Postmenopausal Status  (ICD-V49.81) 12)  Hypertension  (ICD-401.9) 13)  Advef, Drug/medicinal/biological Subst Nos  (ICD-995.20) 14)  Hyperlipidemia  (ICD-272.4)  Current Problems (verified): 1)  Anxiety State, Unspecified  (ICD-300.00) 2)  Sebaceous Cyst, Infected  (ICD-706.2) 3)  Allergic Rhinitis Due To Other Allergen  (ICD-477.8) 4)  Tachycardia  (ICD-785.0) 5)  Contusion of Breast  (ICD-922.0) 6)  Hypercalcemia  (ICD-275.42) 7)  Epistaxis  (ICD-784.7) 8)  Vitamin B12 Deficiency  (ICD-266.2) 9)  Aphthous Ulcers  (ICD-528.2) 10)  Preventive Health Care  (ICD-V70.0) 11)  Asymptomatic Postmenopausal Status  (ICD-V49.81) 12)  Hypertension  (ICD-401.9) 13)  Advef, Drug/medicinal/biological Subst Nos  (ICD-995.20) 14)  Hyperlipidemia  (ICD-272.4)  Medications Prior to Update: 1)  Aspirin 81 Mg  Tabs (Aspirin) .... Two Daily 2)  Vitamin B-12 500 Mcg  Tabs (Cyanocobalamin) .... Once Daily 3)  Vitamin D 1000 Unit Caps (Cholecalciferol) .... Once Daily 4)  Livalo 4 Mg Tabs (Pitavastatin Calcium) .... One By Mouth  Every Sunday Night ( First Pill Tonight) 5)  Benazepril Hcl 40 Mg  Tabs (Benazepril Hcl) .... Once Daily 6)  Bisoprolol Fumarate 5 Mg Tabs (Bisoprolol Fumarate) .... 1/2 By Mouth Daily 7)  Magnesium 500 Mg Tabs (Magnesium) .Marland Kitchen.. 1 Once Daily  Current Medications (verified): 1)  Aspirin 81 Mg  Tabs (Aspirin) .... Two Daily 2)  Vitamin B-12 500 Mcg  Tabs (Cyanocobalamin) .... Once Daily 3)  Vitamin D 1000 Unit Caps (Cholecalciferol) .... Once Daily 4)  Livalo 4 Mg Tabs (Pitavastatin Calcium) .Marland Kitchen.. 1 Every Month 5)  Benazepril Hcl 40 Mg  Tabs (Benazepril Hcl) .... Once Daily 6)  Bisoprolol Fumarate 5 Mg Tabs (Bisoprolol Fumarate) .... 1/2 By Mouth Daily 7)  Magnesium 500 Mg Tabs (Magnesium) .Marland Kitchen.. 1 Once Daily 8)  Welchol 3.75 Gm Pack (Colesevelam Hcl) .... One Pack  Daily  Allergies  (verified): No Known Drug Allergies  Past History:  Family History: Last updated: 10/22/2007 Family History High cholesterol Family History Hypertension  Social History: Last updated: 10/22/2007 Never Smoked Single  widowed  Risk Factors: Smoking Status: never (12/18/2009)  Past medical, surgical, family and social histories (including risk factors) reviewed, and no changes noted (except as noted below).  Past Medical History: Reviewed history from 05/10/2007 and no changes required. Hyperlipidemia Hypertension  Past Surgical History: Reviewed history from 06/20/2009 and no changes required. BCCA of nose  Family History: Reviewed history from 10/22/2007 and no changes required. Family History High cholesterol Family History Hypertension  Social History: Reviewed history from 10/22/2007 and no changes required. Never Smoked Single  widowed  Review of Systems  The patient denies anorexia, fever, weight loss, weight gain, vision loss, decreased hearing, hoarseness, chest pain, syncope, dyspnea on exertion, peripheral edema, prolonged cough, headaches, hemoptysis, abdominal pain, melena, hematochezia, severe indigestion/heartburn, hematuria, incontinence, genital sores, muscle weakness, suspicious skin lesions, transient blindness, difficulty walking, depression, unusual weight change, abnormal bleeding, enlarged lymph nodes, angioedema, and breast masses.    Physical Exam  General:  Well-developed,well-nourished,in no acute distress; alert,appropriate and cooperative throughout examination Head:  normocephalic and atraumatic.   Eyes:  pupils equal, pupils round, and pupils reactive to light.   Ears:  R ear normal and L ear normal.   Nose:  no nasal discharge.   HAs a cyst on the bridge of nose about 2 cm from the surgical scar Neck:  No deformities, masses, or tenderness noted. Lungs:  normal respiratory effort and no wheezes.   Heart:  normal rate and regular rhythm.    Abdomen:  soft, non-tender, and normal bowel sounds.   Extremities:  trace left pedal edema and trace right pedal edema.   Neurologic:  alert & oriented X3.     Impression & Recommendations:  Problem # 1:  ANXIETY STATE, UNSPECIFIED (ICD-300.00) part of her "side effects are the anxiety over medications"  Problem # 2:  HYPERTENSION (ICD-401.9)  Her updated medication list for this problem includes:    Benazepril Hcl 40 Mg Tabs (Benazepril hcl) ..... Once daily    Bisoprolol Fumarate 5 Mg Tabs (Bisoprolol fumarate) .Marland Kitchen... 1/2 by mouth daily  BP today: 140/80 Prior BP: 140/80 (09/25/2009)  Prior 10 Yr Risk Heart Disease: 9 % (02/19/2009)  Labs Reviewed: K+: 4.1 (06/04/2009) Creat: : 0.8 (06/04/2009)   Chol: 292 (11/30/2009)   HDL: 58.40 (11/30/2009)   LDL: 76 (10/05/2008)   TG: 199.0 (11/30/2009)  Orders: Prescription Created Electronically [phone])  Problem # 3:  HYPERLIPIDEMIA (ICD-272.4)  Her updated medication list for this problem includes:    Livalo 4 Mg Tabs (Pitavastatin calcium) .Marland Kitchen... 1 every month    Welchol 3.75 Gm Pack (Colesevelam hcl) ..... One pack  daily  Labs Reviewed: SGOT: 23 (11/30/2009)   SGPT: 21 (11/30/2009)  Lipid Goals: Chol Goal: 200 (02/19/2009)   HDL Goal: 40 (02/19/2009)   LDL Goal: 130 (02/19/2009)   TG Goal:  150 (02/19/2009)  Prior 10 Yr Risk Heart Disease: 9 % (02/19/2009)   HDL:58.40 (11/30/2009), 53.70 (09/18/2009)  LDL:76 (10/05/2008), DEL (47/82/9562)  Chol:292 (11/30/2009), 271 (09/18/2009)  Trig:199.0 (11/30/2009), 202.0 (09/18/2009)  Complete Medication List: 1)  Aspirin 81 Mg Tabs (Aspirin) .... Two daily 2)  Vitamin B-12 500 Mcg Tabs (Cyanocobalamin) .... Once daily 3)  Vitamin D 1000 Unit Caps (Cholecalciferol) .... Once daily 4)  Livalo 4 Mg Tabs (Pitavastatin calcium) .Marland Kitchen.. 1 every month 5)  Benazepril Hcl 40 Mg Tabs (Benazepril hcl) .... Once daily 6)  Bisoprolol Fumarate 5 Mg Tabs (Bisoprolol fumarate) .... 1/2 by mouth  daily 7)  Magnesium 500 Mg Tabs (Magnesium) .Marland Kitchen.. 1 once daily 8)  Welchol 3.75 Gm Pack (Colesevelam hcl) .... One pack  daily  Hypertension Assessment/Plan:      The patient's hypertensive risk group is category B: At least one risk factor (excluding diabetes) with no target organ damage.  Her calculated 10 year risk of coronary heart disease is 9 %.  Today's blood pressure is 140/80.  Her blood pressure goal is < 140/90.  Patient Instructions: 1)  Please schedule a follow-up appointment in 3 months.  for CPX 2)  Hepatic Panel prior to visit, ICD-9:995.20 3)  Lipid Panel prior to visit, ICD-9:272.4 Prescriptions: BENAZEPRIL HCL 40 MG  TABS (BENAZEPRIL HCL) once daily  #30 x 11   Entered and Authorized by:   Stacie Glaze MD   Signed by:   Stacie Glaze MD on 12/18/2009   Method used:   Electronically to        [facility] (retail)       [address]       [place], [place]  [address]       Ph: [phone]       Fax: [phone]   RxID:   [id number] WELCHOL 3.75 GM PACK (COLESEVELAM HCL) one pack  daily  #30 x 11   Entered and Authorized by:   Stacie Glaze MD   Signed by:   Stacie Glaze MD on 12/18/2009   Method used:   Electronically to        [facility] (retail)       [address]       [place], [place]  [address]       Ph: [phone]       Fax: [phone]   RxID:   [id number]
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
## 2010-10-01 NOTE — Progress Notes (Signed)
  Request recieved from [facility] sent to [facility]  April 17, 2010 9:31 AM
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
## 2010-10-01 NOTE — Progress Notes (Signed)
Summary: Question about Med  Phone Note Call from Patient Call back at Home Phone [phone]   Caller: Patient Summary of Call: Pt has question about med that she was started on last month, bottles states that it raises bp.  Pt concerned since she has hypertension. Initial call taken by: Trixie Dredge,  March 26, 2010 8:14 AM  Follow-up for Phone Call        concerned about mobic making bp go up-but now she has completed- will take tramadol for pain Follow-up by: Willy Eddy, LPN,  March 26, 2010 11:04 AM

## 2010-10-03 NOTE — Miscellaneous (Signed)
Summary: Progress Note/[facility]  Progress Note/[facility]   Imported By: Maryln Gottron 08/29/2010 09:40:09  _____________________________________________________________________  External Attachment:    Type:   Image     Comment:   External Document

## 2010-10-10 ENCOUNTER — Telehealth: Payer: Self-pay | Admitting: *Deleted

## 2010-10-10 DIAGNOSIS — J069 Acute upper respiratory infection, unspecified: Secondary | ICD-10-CM

## 2010-10-10 MED ORDER — PHENYLEPHRINE-APAP-GUAIFENESIN 10-650-400 MG/20ML PO LIQD: 5.0000 mL | Freq: Four times a day (QID) | ORAL | Status: DC | PRN

## 2010-10-10 NOTE — Telephone Encounter (Signed)
Pt. Notified.

## 2010-10-10 NOTE — Telephone Encounter (Signed)
Started in January with coughing, sinus drainage and URI symptoms. No fever. Body aches. Taking Aleve.

## 2010-11-13 ENCOUNTER — Other Ambulatory Visit (INDEPENDENT_AMBULATORY_CARE_PROVIDER_SITE_OTHER): Payer: PRIVATE HEALTH INSURANCE | Admitting: Internal Medicine

## 2010-11-13 DIAGNOSIS — T887XXA Unspecified adverse effect of drug or medicament, initial encounter: Secondary | ICD-10-CM

## 2010-11-13 DIAGNOSIS — E785 Hyperlipidemia, unspecified: Secondary | ICD-10-CM

## 2010-11-13 LAB — LDL CHOLESTEROL, DIRECT: Direct LDL: 195.4 mg/dL

## 2010-11-13 LAB — LIPID PANEL
HDL: 50.4 mg/dL (ref >39.00)
Triglycerides: 143 mg/dL (ref 0.0–149.0)

## 2010-11-13 LAB — HEPATIC FUNCTION PANEL
Albumin: 4.1 g/dL (ref 3.5–5.2)
Bilirubin, Direct: 0.1 mg/dL (ref 0.0–0.3)
Total Bilirubin: 0.6 mg/dL (ref 0.3–1.2)
Total Protein: 6.5 g/dL (ref 6.0–8.3)

## 2010-11-19 ENCOUNTER — Encounter: Payer: Self-pay | Admitting: Internal Medicine

## 2010-11-20 ENCOUNTER — Encounter: Payer: Self-pay | Admitting: Internal Medicine

## 2010-11-20 ENCOUNTER — Ambulatory Visit (INDEPENDENT_AMBULATORY_CARE_PROVIDER_SITE_OTHER): Payer: PRIVATE HEALTH INSURANCE | Admitting: Internal Medicine

## 2010-11-20 VITALS — BP 136/80 | HR 68 | Temp 98.1°F | Resp 14 | Ht 62.0 in | Wt 160.0 lb

## 2010-11-20 DIAGNOSIS — F411 Generalized anxiety disorder: Secondary | ICD-10-CM

## 2010-11-20 DIAGNOSIS — I10 Essential (primary) hypertension: Secondary | ICD-10-CM

## 2010-11-20 DIAGNOSIS — R011 Cardiac murmur, unspecified: Secondary | ICD-10-CM | POA: Insufficient documentation

## 2010-11-20 DIAGNOSIS — E785 Hyperlipidemia, unspecified: Secondary | ICD-10-CM

## 2010-11-20 MED ORDER — FISH OIL 1000 MG PO CAPS: 1.0000 | ORAL_CAPSULE | Freq: Two times a day (BID) | ORAL | Status: DC

## 2010-11-20 MED ORDER — RED YEAST RICE 600 MG PO CAPS: 1.0000 | ORAL_CAPSULE | Freq: Two times a day (BID) | ORAL | Status: DC

## 2010-11-20 NOTE — Assessment & Plan Note (Addendum)
Blood pressure stable  And remains compliant with  Medications She is on a BB and an ace inhibitor she brings with her is a list today of her blood pressures at home and most of them are in the 110 to 100 range she feels that some of her elevated blood pressures at the time of her office visit maybe due to anxiety over the office visit so since she is symptomatic from the beta blocker we will discontinue the beta blocker and have her continue to monitor her blood pressures

## 2010-11-20 NOTE — Assessment & Plan Note (Signed)
The patient presents for follow up of hyperlipidemia she is currently taking only official omega-3 supplements she is not on a statin or any other drugs past she's been intolerant of these medications due to muscle pain and currently her cholesterol is stable total cholesterol around 270 and HDL around 50 with high LDL C.  the patient has been unable to sustain a prolonged diet with reduction of fats and carbohydrates.  we  tried pulse statins with a weekly or biweekly dosing she was unable to tolerate these
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
## 2010-11-20 NOTE — Patient Instructions (Signed)
Check your blood pressure every day an EF after you've been off  the beta blocker unit either a rise in her blood pressure greater than 140/80 or you notice a rise in your pulse over 90  You should resume the beta blocker and call my office

## 2010-11-20 NOTE — Assessment & Plan Note (Signed)
Has a 2/6 flow murmur ( AS)

## 2010-11-20 NOTE — Progress Notes (Signed)
Subjective:    Patient ID: Jamie [name], female    DOB: June 14, 1930, 77 y.o.   MRN: [id number]  HPI  patient is a pleasant 77 year old female who presents for followup of hyperlipidemia she has been intolerant of multiple drugs to treat her hyperlipidemia in the past including most classes of statins and fibrinates  Patient has anxiety state and is very nervous about it but her blood pressure and her cholesterol she states that she has trouble following any diet in that she will follow them for a couple of weeks and  Then stopped.   at her advanced age of 67 she is active alert oriented 3-D feel like we should have some amount of cholesterol control she has not followed a alternative therapy regimen before and we have encouraged her to try to be compliant with official capsules 2 twice daily and the red rice yeast 600 mg by mouth twice daily.    Review of Systems  Constitutional: Negative for activity change, appetite change and fatigue.  HENT: Negative for ear pain, congestion, neck pain, postnasal drip and sinus pressure.   Eyes: Negative for redness and visual disturbance.  Respiratory: Negative for cough, shortness of breath and wheezing.   Gastrointestinal: Negative for abdominal pain and abdominal distention.  Genitourinary: Negative for dysuria, frequency and menstrual problem.  Musculoskeletal: Negative for myalgias, joint swelling and arthralgias.  Skin: Negative for rash and wound.  Neurological: Negative for dizziness, weakness and headaches.  Hematological: Negative for adenopathy. Does not bruise/bleed easily.  Psychiatric/Behavioral: Negative for sleep disturbance and decreased concentration.   Past Medical History  Diagnosis Date  . Hyperlipidemia   . Hypertension   . Obesity    Past Surgical History  Procedure Date  . Bcca of nose     reports that she quit smoking about 39 years ago. She has never used smokeless tobacco. She reports that she drinks alcohol. She  reports that she does not use illicit drugs. family history includes Hyperlipidemia in her mother and Hypertension in her mother. No Known Allergies     Objective:   Physical Exam  [nursing notereviewed. Constitutional: She is oriented to person, place, and time. She appears well-developed and well-nourished. No distress.  HENT:  Head: Normocephalic and atraumatic.  Right Ear: External ear normal.  Left Ear: External ear normal.  Nose: Nose normal.  Mouth/Throat: Oropharynx is clear and moist.  Eyes: Conjunctivae and EOM are normal. Pupils are equal, round, and reactive to light.  Neck: Normal range of motion. Neck supple. No JVD present. No tracheal deviation present. No thyromegaly present.  Cardiovascular: Normal rate, regular rhythm, normal heart sounds and intact distal pulses.   No murmur heard. Pulmonary/Chest: Effort normal and breath sounds normal. She has no wheezes. She exhibits no tenderness.  Abdominal: Soft. Bowel sounds are normal.  Musculoskeletal: Normal range of motion. She exhibits no edema and no tenderness.  Lymphadenopathy:    She has no cervical adenopathy.  Neurological: She is alert and oriented to person, place, and time. She has normal reflexes. No cranial nerve deficit.  Skin: Skin is warm and dry. She is not diaphoretic.  Psychiatric: She has a normal mood and affect. Her behavior is normal.          Assessment & Plan:   we spent over 30 minutes face-to-face counseling the patient about diet and exercise and the appropriate amount of visual and red dry she is to take as a natural way to lower her cluster of discussed  weight loss exercise and compliance with a lower carbohydrate and fat diet she is asked to try this one more time and after that it will be apparent that he cannot follow a diet or utilize statin drugs that this would be a risk factor that she has to live with she expressed understanding of our concerns in this area and will try the  alternative therapy.   Blood pressure is well controlled she brings with her a list of her blood pressures from home which showed very good blood pressures she states she is anxious when she comes to our office and often has elevated blood pressures here she no longer has palpitations she has been reducing her beta blocker by cutting in half we have suggested that we try to wean her off the beta blocker monitor blood pressure on her ACE bring her back for blood pressure check in 2 months

## 2011-01-01 ENCOUNTER — Telehealth: Payer: Self-pay | Admitting: *Deleted

## 2011-01-01 ENCOUNTER — Other Ambulatory Visit (INDEPENDENT_AMBULATORY_CARE_PROVIDER_SITE_OTHER): Payer: PRIVATE HEALTH INSURANCE | Admitting: Internal Medicine

## 2011-01-01 DIAGNOSIS — E039 Hypothyroidism, unspecified: Secondary | ICD-10-CM

## 2011-01-01 LAB — T4, FREE: Free T4: 1.29 ng/dL (ref 0.60–1.60)

## 2011-01-01 NOTE — Telephone Encounter (Signed)
Left message to call back for time for labs today.  Please give appt for TSH, T3, Free , T4 Free.

## 2011-01-01 NOTE — Telephone Encounter (Signed)
Pt is having symptoms that she feels are thyroid related...hair loss, cold intolerance, sweating, constipation, and fatigue, and would like to have lab work TODAY.

## 2011-01-01 NOTE — Telephone Encounter (Signed)
Pt returned call. Pt has been sch for TSH, T3, Free , T4 Free, as noted below. Pt coming in today, 01/01/11 at 2:15pm for labs.

## 2011-01-01 NOTE — Telephone Encounter (Signed)
tsh t3 free and t4 free

## 2011-01-09 ENCOUNTER — Encounter: Payer: Self-pay | Admitting: Internal Medicine

## 2011-01-14 NOTE — Op Note (Signed)
NAMESHAUNI, HENNER NO.:  [id number]   MEDICAL RECORD NO.:  [id number]           PATIENT TYPE:   LOCATION:  [facility]                         FACILITY:  [facility]   PHYSICIAN:  De Blanch, M.D.DATE OF BIRTH:  April 16, 1930   DATE OF PROCEDURE:  12/01/2007  DATE OF DISCHARGE:                               OPERATIVE REPORT   PREOPERATIVE DIAGNOSIS:  Complex right ovarian mass, uterine fibroids.   POSTOPERATIVE DIAGNOSIS:  Complex right ovarian mass, uterine fibroids.   PROCEDURE:  Laparoscopic bilateral salpingo-oophorectomy, myomectomy.   SURGEON:  De Blanch, M.D.   ASSISTANTS:  Cordelia Pen A. Rosalio Macadamia, M.D., Telford Nab, R.N.   ANESTHESIA:  General through orotracheal tube.   ESTIMATED BLOOD LOSS:  Minimal.   SURGICAL FINDINGS:  At the time of laparoscopy, the patient's upper  abdomen including liver and diaphragm, omentum, transverse colon and  stomach appeared normal.  The uterus was essentially normal except for a  pedunculated calcified uterine fibroid arising from the right uterine  cornua measuring approximately 5 cm in diameter.  The right ovary was  replaced by a cyst of approximately 6 cm in diameter.  The cyst was not  adherent to any surfaces.  The left tube and ovary appear normal.   PROCEDURE:  The patient was brought to the operating room and after  satisfactory attainment of general anesthesia was placed in a modified  lithotomy position in Beebe stirrups.  Her arms were tucked at her  sides, taking care to avoid injury.  The abdomen, perineum and vagina  were prepped with Betadine.  Foley catheter was inserted.  A Hulka  tenaculum was placed in the uterus, and the patient was draped.  Entry  into the peritoneal cavity was achieved through an incision in the  umbilicus with direct  visualization of the peritoneum.  A Hasson  cannula was placed in the perineal cavity and the laparoscope introduced  visualizing the pelvis and  upper abdomen with the above-noted findings.  Two 5 mm ports were placed under direct visualization laterally, and a  suprapubic 12 mm port was placed under direct visualization.  Peritoneal  washings were obtained and sent to cytopathology.  The right  retroperitoneal space was opened identifying the ureter.  The ovarian  vessels were then cauterized with the bipolar device and then divided.  The peritoneum on the medial aspect of the pelvic sidewall was incised  underneath the cyst until the cyst was mobilized.  The round ligament,  fallopian tube, and uterine ovarian ligament were then divided after  being cauterized with the bipolar device.  A cyst was placed in an  EndoCatch bag and drained for approximately 100 cc of straw-colored  fluid.  It was then removed and sent to pathology for permanent section.  A similar procedure was performed on the left side of the pelvis  removing the left tube and ovary.   Attention was then turned to the pedunculated uterine fibroid.  The  stalk of the fibroid was divided with a bipolar device, and then the  fibroid morcellator was used to morcellate the fibroid extracting  the  entire specimen.  Fragments remaining in the pelvis were removed under  direct visualization.  The pelvis was irrigated with saline and then  Marcaine.  All pedicles were inspected and found to be hemostatic.  The  ports were removed under direct visualization.  The fascia and the  umbilicus in the suprapubic region were reapproximated with 0 Vicryl.  The skin  was closed with 3-0 subcuticular sutures of 3-0 Vicryl.  Steri-strips  were applied.  The patient was awakened from anesthesia and taken to the  recovery room after the Foley catheter and Hulka tenaculum were removed.  Sponge, needle, and instrument counts were correct x2.      De Blanch, M.D.  Electronically Signed     DC/MEDQ  D:  12/01/2007  T:  12/01/2007  Job:  [id number]   cc:   Sherry A.  Rosalio Macadamia, M.D.  Fax: [phone]   Telford Nab, R.N.  [address]  [place], [place] [address]

## 2011-01-14 NOTE — Consult Note (Signed)
Jamie [name], Jamie [name] NO.:  [id number]   MEDICAL RECORD NO.:  [id number]          PATIENT TYPE:  OUT   LOCATION:  GYN                          FACILITY:  [facility]   PHYSICIAN:  De Blanch, M.D.DATE OF BIRTH:  08-07-1930   DATE OF CONSULTATION:  05/11/2007  DATE OF DISCHARGE:                                 CONSULTATION   CHIEF COMPLAINT:  Ovarian cyst (complex).   HISTORY OF PRESENT ILLNESS:  A 77 year old white female seen in  consultation at the request of Cordelia Pen A. Rosalio Macadamia, M.D. regarding a  progressive ovarian cyst.  The patient's ovarian cyst dates back to May  of 2002 at which time it was 1.3 x 1.2 cm.  I initially saw the patient  in 2003 at which time the cyst was 2.7 x 2.4 cm, but was entirely cystic  and entirely asymptomatic.  The patient has subsequently been followed  by Dr. Rosalio Macadamia with serial ultrasounds and most recently an MRI.  The  most recent ultrasound shows that the cyst has increased in size, now  measuring 6.6 x 4.8 x 5.5 cm.  There is calcification and shadowing in  the posterior wall.  There is also solid hyperechoic nodule on the  anterior wall measuring approximately 5 mm.  The patient also has known  small uterine fibroids.  MRI has been obtained which shows the right  adnexal mass measures 5.4 x 7.1 x 5.6 cm.  It is predominantly cystic  although there is some 9 mm nodular enhancement posteriorly.  Left ovary  appears normal.  There are no enlarged lymph nodes.  The patient's CA125  value is 8.1 units per mL.   PAST MEDICAL HISTORY:  Medical illnesses; obesity, hypertension, and  elevated cholesterol (most currently 270).   CURRENT MEDICATIONS:  1. Lipitor.  2. Lotrel.  3. Multivitamin.   ALLERGIES:  No known drug allergies.   PAST SURGICAL HISTORY:  D&C and laparotomy.  The patient believes that  she had a myomectomy through the laparoscope, although, those records  are not available.   FAMILY HISTORY:   Negative for gynecologic, breast, or colon cancers.   REVIEW OF SYSTEMS:  10-point comprehensive review of systems is negative  except as noted above.   PHYSICAL EXAMINATION:  VITAL SIGNS:  Weight 174 pounds.  HEENT:  5 feet 2 inches, blood pressure 164/98, pulse 100.  GENERAL:  The patient is a healthy, older white female in no acute  distress.  HEENT:  Negative.  NECK:  Supple without thyromegaly.  There is no supraclavicular or  inguinal adenopathy.  ABDOMEN:  Soft and nontender.  No mass, organomegaly, ascites, or  hernias are noted.  PELVIC:  EGBUS, vagina, bladder, and urethra are normal.  The cervix is  atrophic and normal.  The uterus is slightly irregular, not fully  appreciated because of the patient's obesity.  There does seem to be a  smooth 3 cm nodule posteriorly which I believe is a fibroid.  I am  unable to appreciate the adnexal mass on bimanual examination.  Rectovaginal examination confirms.   IMPRESSION:  Persistent adnexal mass which has been gradually increasing  in size over the past 6 years.  It now contains solid components and  despite the fact that the patient is asymptomatic, I would recommend  that it be removed on the very small outside chance that it were a  malignancy.  The patient understands this rationale.  I would suggest  that we attempt to remove it laparoscopically.  The patient does not  wish to have a hysterectomy which I think is fine given that her uterine  fibroids are stable and asymptomatic.  I have also recommended that the  patient have the other ovary removed if technically feasible, although,  she will need to consent to this after considering further.  The risks  of surgery were outlined.  She understands that a laparotomy may be  required if the procedure cannot be accomplished laparoscopically.  We  will coordinate surgery with Dr. Rosalio Macadamia.      De Blanch, M.D.  Electronically Signed     DC/MEDQ  D:   05/11/2007  T:  05/12/2007  Job:  [id number]   cc:   Sherry A. Rosalio Macadamia, M.D.  Fax: [phone]   Stacie Glaze, MD  [address] [place]  [place] [address]   Telford Nab, R.N.  [address]  [place], [place] [address]

## 2011-01-14 NOTE — Consult Note (Signed)
[name], [name] NO.:  [id number]   MEDICAL RECORD NO.:  [id number]          PATIENT TYPE:  OUT   LOCATION:  GYN                          FACILITY:  [facility]   PHYSICIAN:  De Blanch, M.D.DATE OF BIRTH:  Aug 20, 1930   DATE OF CONSULTATION:  01/05/2008  DATE OF DISCHARGE:                                 CONSULTATION   CHIEF COMPLAINT:  Postoperative followup.   INTERVAL HISTORY:  The patient returns today for postoperative followup  having undergone a laparoscopic bilateral salpingo-oophorectomy and  myomectomy on December 01, 2007.  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  The  left ovary had some inclusion cysts and the myoma was benign.  The  patient has had an uncomplicated postoperative course.   PHYSICAL EXAMINATION:  ABDOMEN:  Is soft and nontender.  All  laparoscopic incisions are healing well.   IMPRESSION:  Excellent recovery following laparoscopic bilateral  salpingo-oophorectomy and myomectomy.  The patient is given the okay to  return to full levels of activity.  She will return to the care of Dr.  Rosalio Macadamia for routine gynecologic care.      De Blanch, M.D.  Electronically Signed     DC/MEDQ  D:  01/05/2008  T:  01/05/2008  Job:  [id number]   cc:   Telford Nab, R.N.  [address]  [place], [place] [address]   Eliberto Ivory. Rosalio Macadamia, M.D.  Fax: [phone]

## 2011-01-14 NOTE — Consult Note (Signed)
Jamie [name], SHAMPINE NO.:  [id number]   MEDICAL RECORD NO.:  [id number]          PATIENT TYPE:  OUT   LOCATION:  GYN                          FACILITY:  [facility]   PHYSICIAN:  De Blanch, M.D.DATE OF BIRTH:  1929-10-12   DATE OF CONSULTATION:  11/16/2007  DATE OF DISCHARGE:                                 CONSULTATION   This 77 year old white female returns for preoperative visit.  She has a  persistent slightly increasing ovarian cyst.  She is scheduled to  undergo laparoscopic bilateral salpingo-oophorectomy on December 01, 2007 in  conjunction with Dr. Rosalio Macadamia.   __________ anesthesia and the surgical procedure itself.  We have agreed  that she will undergo bilateral salpingo-oophorectomy and intraoperative  frozen section.  We believe this is most likely benign, but surgical  staging would be performed.  The risks of surgery, including hemorrhage,  infection, injury to adjacent viscera, thrombolic complications, and  anesthetic risks have been outlined.  The patient is aware that a  laparotomy may be required.   Face-to-face consultation time today is approximately 20 minutes.   PHYSICAL EXAMINATION:  VITAL SIGNS:  Weight 171 pounds, blood pressure  170/90.  GENERAL:  The patient is a pleasant white female. in no acute distress.  HEENT:  Negative.  NECK:  Supple, without thyromegaly.  LYMPHATIC:  There is no supraclavicular or inguinal adenopathy.  ABDOMEN:  Soft, nontender.  No mass, organomegaly, ascites, or hernias  are noted.  PELVIC:  EG, BUS, vagina, bladder, and urethra are normal.  Cervix seems  normal.  Uterus is anterior, normal shape, size, and consistency.  There  is an approximately 4 cm cystic structure felt in the right adnexa.  There is no tenderness.   PLAN:  Complex pelvic mass, which we have followed for a number years,  but this had gotten slightly larger.  We will proceed with surgery as  planned.  All of Mrs. Gastineau's  questions were answered.  She will undergo  preoperative evaluation and consultation with the anesthesiologist  preoperatively.      De Blanch, M.D.  Electronically Signed     DC/MEDQ  D:  11/16/2007  T:  11/17/2007  Job:  [id number]   cc:   Sherry A. Rosalio Macadamia, M.D.  Fax: [phone]   Telford Nab, R.N.  [address]  [place], [place] [address]

## 2011-01-17 NOTE — Op Note (Signed)
   NAMESHAYLYN, BAWA                               ACCOUNT NO.:  [id number]   MEDICAL RECORD NO.:  [id number]                   PATIENT TYPE:  AMB   LOCATION:  ENDO                                 [facility]ACILITY[facility]  [facility]   PHYSICIAN:  Bernette Redbird, M.D.                DATE OF BIRTH:  29-Mar-1930   DATE OF PROCEDURE:  07/04/2003  DATE OF DISCHARGE:                                 OPERATIVE REPORT   PROCEDURE:  Upper endoscopy with biopsy.   INDICATION:  Followup of short segment Barrett esophagus in a 77 year old  female who, a year and a half ago, had biopsies, which were indeterminate  for dysplasia.   FINDINGS:  Moderately large hiatal hernia.  Minimal Barrett's tissue,  biopsied.   PROCEDURE:  The nature, purpose, and risks of the procedure had been  discussed with the patient who had provided consent.  Sedation was Fentanyl  50 mcg and Versed 7 mg IV without arrhythmias or desaturations.   Olympus video-endoscope under direct vision entering the esophagus without  significant difficulty.  The vocal cords were not well seen.   The esophageal mucosa was essentially normal, except right at the GE  junction where there were a couple of small tongues, probably no more than 1  cm in length, on contralateral sides of the esophageal lumen.  Each of these  was biopsied at the conclusion of the procedure.  No ring or stricture was  present.  There was a roughly 4-5 cm hiatal hernia present with a fairly  wide transverse diameter and a patulous diaphragmatic hiatus, best  appreciated in retroflex view, probably about 8 cm across.  The abdominal  portion of the stomach was normal without evidence of gastritis, erosions,  ulcers, polyps, or masses, and no other abdominoplasty were noted on  retroflex view of the proximal stomach.  The pylorus, duodenal bulb, and  second duodenum looked normal.   After obtaining the above-mentioned biopsies, the scope was removed from the  patient who tolerated  the procedure well without apparent complications.   IMPRESSION:  1. Short segment Barrett esophagus, as described above.  Pathology pending.  2. Moderately large hiatal hernia.   PLAN:  Await pathology results.                                               Bernette Redbird, M.D.    RB/MEDQ  D:  07/04/2003  T:  07/04/2003  Job:  [id number]   cc:   Stacie Glaze, M.D. [facility]

## 2011-01-17 NOTE — Op Note (Signed)
NAMERYANNA, Jamie [name]                   ACCOUNT NO.:  [id number]   MEDICAL RECORD NO.:  [id number]          PATIENT TYPE:  AMB   LOCATION:  ENDO                         FACILITY:  [facility]   PHYSICIAN:  Bernette Redbird, M.D.   DATE OF BIRTH:  November 24, 1929   DATE OF PROCEDURE:  12/04/2004  DATE OF DISCHARGE:                                 OPERATIVE REPORT   PROCEDURE:  Upper endoscopy with biopsies.   INDICATION:  Follow-up of long-standing Barrett's esophagus, short segment,  with biopsies indeterminate for low grade dysplasia on her most recent exams  (most recent exam was about a year and half ago).   FINDINGS:  Minimal Barrett's changes of the GE junction. Moderate hiatal  hernia.   PROCEDURE:  The nature, purpose and risks of the procedure were familiar to  the patient from prior examinations, and she provided written consent.  Sedation was fentanyl 50 mcg and Versed 7 mg IV without arrhythmias or  desaturation. The Olympus video endoscope was passed under direct vision.  The vocal cords were grossly unremarkable. The esophagus was readily entered  and had normal mucosa down to the GE junction where there was some slight  irregularity of the Z-line and perhaps some minimal tongues of Barrett's  mucosa which I biopsied several times. No reflux esophagitis, free reflux,  varices, infection, neoplasia, or stricture or ring were noted. There was a  roughly 5-cm hiatal hernia present. The stomach had normal mucosa without  evidence of gastritis, erosions, ulcers, polyps or masses including a  retroflexed view of the proximal stomach, and the pylorus, duodenal bulb and  second duodenum looked normal.   After obtaining the above-mentioned biopsies, the scope was removed from the  patient who tolerated the procedure well without apparent complications.   IMPRESSION:  1.  Barrett's esophagus with minimal changes currently present (530.85)  2.  Moderate-sized hiatal hernia.   PLAN:  Await  pathology results.      RB/MEDQ  D:  12/04/2004  T:  12/04/2004  Job:  [id number]   cc:   Stacie Glaze, M.D. [facility]

## 2011-01-17 NOTE — Op Note (Signed)
NAMEHILLARIE, Jamie [name]                   ACCOUNT NO.:  [id number]   MEDICAL RECORD NO.:  [id number]          PATIENT TYPE:  AMB   LOCATION:  SDS                          FACILITY:  [facility]   PHYSICIAN:  Velora Heckler, MD      DATE OF BIRTH:  1930-07-02   DATE OF PROCEDURE:  02/02/2006  DATE OF DISCHARGE:                                 OPERATIVE REPORT   PREOPERATIVE DIAGNOSIS:  Primary hyperparathyroidism.   POSTOPERATIVE DIAGNOSIS:  Primary hyperparathyroidism.   PROCEDURE:  Minimally invasive parathyroidectomy (image-guided right  superior parathyroidectomy)   SURGEON:  Velora Heckler, M.D., FACS   ASSISTANT:  Jerelene Redden, M.D., FACS   ANESTHESIA:  General.   ESTIMATED BLOOD LOSS:  Minimal.   PREPARATION:  Betadine.   COMPLICATIONS:  None.   INDICATIONS:  Patient is a pleasant 77 year old white female from  [place], [place], found to have hypercalcemia in the summer of  2006.  The patient underwent diagnostic studies, including a nuclear  medicine parathyroid scan, performed January 2007, at [facility].  This localized a parathyroid adenoma in the right superior position.  The  patient now comes to surgery for resection.   BODY OF REPORT.:  Procedure was done in OR #10 at the [facility].  The patient was brought to the operating room and placed in  supine position on the operating room table.  Following administration of  general anesthesia, the patient was prepped and draped in the usual strict  aseptic fashion.  After ascertaining that an adequate level of anesthesia  been obtained, a right mid neck transverse incision was made with a #10  blade.  Dissection was carried down through subcutaneous tissues and  platysma.  Strap muscles were incised in the midline.  Weitlaner retractor  was placed for exposure.  The right thyroid lobe was exposed.  Venous  tributaries to the lobe were divided between small Ligaclips.  The superior  pole was exposed and explored.  The space between the right thyroid lobe and  the jugular vein was opened.  A normal inferior gland on the right was  identified.  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  This was gently dissected out.  Vascular pedicle was divided  between small and medium Ligaclips.  Gland is completely excised.  It is  submitted in its entirety to pathology for review.  Dr. Charlott Rakes did a  frozen section and confirmed parathyroid tissue, weighing approximately 600  mg, consistent with parathyroid adenoma.  Good hemostasis was noted.  Surgicel was placed in the bed of the parathyroid gland.  Strap muscles were  reapproximated in the midline with interrupted 3-0 Vicryl sutures.  Platysma  was closed with interrupted 3-0  Vicryl sutures.  Skin was closed with a running 4-0 Vicryl subcuticular  suture.  Skin was anesthetized with local Marcaine.  Benzoin and Steri-  Strips were applied.  Sterile dressings were applied.  The patient is  awakened from  anesthesia and brought to the recovery room in stable  condition.  The patient tolerated the procedure well.      Velora Heckler, MD  Electronically Signed     TMG/MEDQ  D:  02/02/2006  T:  02/03/2006  Job:  [id number]   cc:   Dorisann Frames, M.D.  Fax: [phone]   Stacie Glaze, M.D. [facility]  [address] [place]  [place] [address]   Velora Heckler, MD  [address] [place]  [place] [address]

## 2011-01-17 NOTE — Assessment & Plan Note (Signed)
[facility]                                 ON-CALL NOTE   Jamie [name], Jamie [name]                         MRN:          [id number]  DATE:10/24/2006                            DOB:          Jun 27, 1930    The phone call was about 11:48 a.m.  Patient of Dr. Lovell Sheehan.  [name]  is having dizziness and sinus problems, so she was given an appointment  to be seen in the clinic.     Karie Schwalbe, MD  Electronically Signed    RIL/MedQ  DD: 10/24/2006  DT: 10/24/2006  Job #: [id number]   cc:   Stacie Glaze, MD

## 2011-01-17 NOTE — Assessment & Plan Note (Signed)
[name]                                 ON-CALL NOTE   Jamie [name], Jamie [name]                          MRN:          [id number]  DATE:12/13/2006                            DOB:          [birth year]    Patient of Dr. Lovell Sheehan.  [phone].   Patient calling because she has head congestion.  She has taken 2 rounds  of antibiotics and is still not better.  She has sneezing, runny nose,  watery eyes, etc.  May be allergy,  may be viral.  Since she is  otherwise well, would do nothing now.  Call Dr. Lovell Sheehan next week.     Jeffrey A. Tawanna Cooler, MD  Electronically Signed    JAT/MedQ  DD: 12/13/2006  DT: 12/13/2006  Job #: [id number]

## 2011-01-17 NOTE — Op Note (Signed)
[place]  Patient:    Jamie [name], Jamie [name]                            MRN: [id number] Proc. Date: 07/08/00 Adm. Date:  [id number] Attending:  Rich Brave CC:         Stacie Glaze, M.D. [facility]   Operative Report  PROCEDURE PERFORMED:  Colonoscopy.  ENDOSCOPIST:  Florencia Reasons, M.D.  INDICATIONS FOR PROCEDURE:  Screening for colon cancer in a 78 year old female.  FINDINGS:  Normal exam to the cecum other than a cecal lipoma.  DESCRIPTION OF PROCEDURE:  The nature, purpose and risks of the procedure had been discussed with the patient, who provided written consent. Sedation for this procedure and the upper endoscopy which preceeded it totalled fentanyl 110 mcg and Versed 11 mg IV without arrhythmias or desaturation. The Olympus pediatric adjustable tension video colonoscope was advanced without much difficulty the cecum, turning the patient into the supine position and applying some external abdominal compression to facilitate entering the base of the cecum which was identified by visualization of the appendiceal orifice.  Pullback was then performed in a gradual fashion.  The quality of the prep was excellent and it was felt that all areas were adequately seen although in the proximal colon, there were some fairly deep clefts associated with the haustrations and it was sometimes difficult to see down to the base of those clefts, but I do feel an adequate job was able to be achieved by careful technique.  There was a lipoma in the cecum, but no polyps, cancer, colitis, vascular malformations or diverticular disease were observed during this exam. Retroflexion in the rectum was unremarkable.  No biopsies were obtained.  The patient tolerated the procedure well.  There were no apparent complications.  IMPRESSION:  Normal screening colonoscopy.  PLAN:  Consider screening flexible sigmoidoscopy in four years with follow-up colonoscopy  perhaps 4 years thereafter for screening purposes. DD:  07/08/00 TD:  07/08/00 Job: [id number]

## 2011-01-17 NOTE — Procedures (Signed)
[place]  Patient:    Jamie [name], Jamie [name]                            MRN: [id number] Proc. Date: 07/08/00 Adm. Date:  [id number] Attending:  Rich Brave CC:         Stacie Glaze, M.D. [facility]                           Procedure Report  PROCEDURE PERFORMED:  Upper endoscopy with biopsies.  ENDOSCOPIST:  Florencia Reasons, M.D.  INDICATIONS FOR PROCEDURE:  The patient is a 77 year old female with longstanding reflux symptoms.  FINDINGS:  A 4 to 5 cm hiatal hernia with short segment Barretts esophagus.  DESCRIPTION OF PROCEDURE:  The nature, purpose and risks of the procedure had been discussed with the patient, who provided written consent.  Sedation was fentanyl 110 mcg and Versed 11 mg IV prior to and during this procedure and the colonoscopy which followed it.  The Olympus adult video endoscope was passed under direct vision, entering the esophagus without difficulty.  The vocal cords were not well seen.  The distal esophagus had short segment serpiginous deeply erythematous tongues of mucosa suggestive of short segment Barretts esophagus, with a slightly extubated character to the mucosa also raising the question of this being non-Barretts reflux esophagitis with erosive changes.  Several biopsies were obtained from these areas at the conclusion of the procedure.  There appeared to be a widely patent esophageal ring at the squamocolumnar junction which was located at about 33 cm from the mouth.  Below this was a 4 cm hiatal hernia with a diaphragmatic hiatus at about 37 or 38 cm.  The abdominal portion of the stomach was entered.  It contained a small to moderate clear residual which was suctioned up. The gastric mucosa was unremarkable, without evidence of gastritis, erosions, polyps or masses.  Retroflex viewing showed that the diaphragmatic hiatus was widely patulous. The pylorus, duodenal bulb and the second duodenum looked  normal.  After obtaining the above-mentioned biopsies, the scope was removed from the patient who tolerated the procedure well without apparent complication.  IMPRESSION: 1. Short segment Barretts esophagus versus reflux esophagitis, pathology    pending. 2. Possible esophageal ring. 3. Medium large hiatal hernia with very patulous diaphragmatic hiatus.  PLAN: 1. Await pathology on biopsies. 2. Consider trial of PPI therapy since the patient has recurring episodes of    heartburn which have not responded to prescription strength H2 blockers. 3. Proceed to screening colonoscopy.  PLAN: DD:  07/08/00 TD:  07/08/00 Job: [id number]

## 2011-01-17 NOTE — Procedures (Signed)
[place]  Patient:    Jamie [name], Jamie [name] Visit Number: [id number] MRN: [id number]          Service Type: END Location: ENDO Attending Physician:  Rich Brave Dictated by:   Florencia Reasons, M.D. Proc. Date: 11/10/01 Admit Date:  11/10/2001   CC:         Stacie Glaze, M.D. [facility]   Procedure Report  PROCEDURE:  Upper endoscopy with biopsies.  SURGEON:  Florencia Reasons, M.D.  INDICATIONS:  Followup with Barretts esophagus in a 77 year old female with ongoing, somewhat refractory regurgitation symptoms, not really being adequately controlled by Nexium 40 mg daily.  FINDINGS:  Very large hiatal hernia.  Minimal Barretts esophagus.  Free reflux.  DESCRIPTION OF PROCEDURE:  The nature, purpose, and risks of the procedure were familiar to the patient from prior examination.  She provided written consent.  Sedation is fentanyl 75 mcg and Versed 8 mg IV with some transient desaturation down to 87% and what appeared to be sort of some obstructive sleep apnea was remedied by positioning of the patients jaw.  The Olympus video endoscope was passed under direct vision.  The vocal cords were not well-seen.  The esophagus was quite easily entered and had normal mucosa down to the region of the gastroesophageal junction, where there was a small (4 x 8 mm) island of Barretts-appearing mucosa on the right hand wall of the esophagus and a short tongue of Barretts esophagus on the left wall, both biopsied at the conclusion of the procedure.  There was some free reflux noted, but no active esophagitis.  No infections, varices, neoplasia, ring, or stricture were observed.  There was a very large hiatal hernia present, oriented primarily in the transverse dimension.  The top of the hiatal hernia was at 30 cm.  The diaphragmatic hiatus at about 35 cm.  The diaphragmatic hiatus as seen on retroflexed viewing was very patulous, probably 20 cm across.   No Cameron lesions were observed.  The abdominal portion of the stomach was unremarkable, specifically without evidence of significant gastritis or any erosions, ulcers, polyps, or masses. The pylorus, duodenal bulb, and second duodenum looked normal.  The scope was removed from the patient who tolerated the procedure well and without apparent complication.  IMPRESSION: 1. Minimal Barretts esophagus. 2. Large hiatal hernia with free reflux.  PLAN: 1. Await pathology on biopsies. 2. Continue Nexium for now. 3. Consider antireflux surgery due to a large mechanical component of symptoms    including nocturnal regurgitation and "strangling." Dictated by:   Florencia Reasons, M.D. Attending Physician:  Rich Brave DD:  11/10/01 TD:  11/11/01 Job: [id number]

## 2011-01-17 NOTE — Consult Note (Signed)
NAMEDEWANNA, Jamie [name] NO.:  [id number]   MEDICAL RECORD NO.:  [id number]          PATIENT TYPE:  OUT   LOCATION:  GYN                          FACILITY:  [facility]   PHYSICIAN:  De Blanch, M.D.DATE OF BIRTH:  05-04-1930   DATE OF CONSULTATION:  09/22/2007  DATE OF DISCHARGE:  05/11/2007                                 CONSULTATION   I had a telephone conversation with Mrs. Jamie [name] today.  She has deferred  recommended surgery for several months but now wishes to proceed with  surgery, although she has a number questions. Her primary question is  whether she needs to have a hysterectomy.  I indicated to the patient  that our primary concern is her complex adnexal mass and that a  hysterectomy is not necessary nor is not necessarily recommended.  After  clarifying the surgical procedure, the patient wishes to proceed  laparoscopically assisted salpingo-oophorectomy with intraoperative  frozen section of the complex mass.  If possible, she would also like to  have the contralateral ovary removed to prevent other ovarian neoplasia  in the future.  The patient wishes to defer surgery until after her  birthday on February 18.  We will, therefore, schedule her surgery for  December 01, 2007.  We will coordinate surgery with Dr. Floyde Parkins.   The risks of surgery have been discussed.      De Blanch, M.D.  Electronically Signed     DC/MEDQ  D:  09/22/2007  T:  09/22/2007  Job:  [id number]   cc:   Sherry A. Rosalio Macadamia, M.D.  Fax: [phone]   Telford Nab, R.N.  [address]  [place], [place] [address]

## 2011-01-17 NOTE — Consult Note (Signed)
[facility]  Patient:    Jamie [name], Jamie [name] Visit Number: [id number] MRN: [id number]          Service Type: GON Location: GYN Attending Physician:  Jeannette Corpus Dictated by:   Rande Brunt Clarke-Pearson, M.D. Admit Date:  12/15/2001   CC:         Cordelia Pen A. Rosalio Macadamia, M.D.  Telford Nab, R.N.   Consultation Report  HISTORY OF PRESENT ILLNESS:  This is a 77 year old white female who is seen in consultation at the request of Dr. Floyde Parkins regarding an ovarian cyst.  The patient has a longstanding gynecologic history of uterine fibroids, having undergone a diagnostic laparoscopy at one point in time. The fibroids have been entirely symptomatic. On routine screening, the patient was found to have an ovarian cyst measuring 1.3 x 1.2 cm in May 2002. Follow-up ultrasound in February 2003 showed this cyst had grown slightly in size to 2.76 x 2.4 cm. The patient is entirely asymptomatic. She specifically denies any pelvic pain, pressure, vaginal bleeding, or discharge. She has no GI or GU symptoms.  The ultrasounds have been reviewed, and this is a simple cyst. In addition, the patients CA125 value is 9.7 units per ml.  PAST MEDICAL HISTORY/MEDICAL ILLNESSES: 1. Hypertension. 2. Obesity. 3. Elevated cholesterol.  ALLERGIES:  No known drug allergies.  CURRENT MEDICATIONS: 1. Lipitor. 2. Micardis. 3. Nexium.  PAST SURGICAL HISTORY:  Reveals a D&C and laparoscopy.  FAMILY HISTORY:  Negative for gynecologic, breast or colon cancers.  PHYSICAL EXAMINATION:  VITAL SIGNS:  Height 5 feet 2 inches. Weight 193 pounds, blood pressure 160/105. Pulse 90, respirations 18.  GENERAL:  The patient is a pleasant, obese, white female in no acute distress.  HEENT:  Negative.  NECK:  Supple without thyromegaly. There is no cervical, supraclavicular, or inguinal adenopathy.  ABDOMEN:  Soft, obese, and nontender.  No masses, organomegaly, ascites  or hernias noted.  PELVIC:  EG/BUS normal. Vagina is clean and well supported as is the cervix. Uterus is difficult to outline but does feel slightly irregular and slightly enlarged. This is no more than [redacted] weeks gestational size, on the other hand. No adnexal masses are appreciated.  I have reviewed the records from Dr. Buddy Duty office as well as the ultrasounds and their reports.  IMPRESSION:  Simple, small ovarian cyst.  PLAN:  I had a lengthy discussion with the patient and her friend regarding the natural history of these. Given the entirely simple nature of this cyst, I believe the risk of serious malignant ovarian neoplasm is nearly 0, and would therefore recommend that it be followed serially with ultrasounds at 20-month intervals. There are no worrisome characteristics, and it is also noted that her CA125 is normal.  All of the patients questions were answered. She will return to the care of Dr. Rosalio Macadamia with the plan of obtaining an ultrasound in approximately 6 months. Dictated by:   Rande Brunt. Clarke-Pearson, M.D. Attending Physician:  Jeannette Corpus DD:  12/15/01 TD:  12/15/01 Job: [id number]

## 2011-01-28 ENCOUNTER — Other Ambulatory Visit: Payer: Self-pay | Admitting: *Deleted

## 2011-01-28 ENCOUNTER — Telehealth: Payer: Self-pay | Admitting: Internal Medicine

## 2011-01-28 MED ORDER — BENAZEPRIL-HYDROCHLOROTHIAZIDE 20-12.5 MG PO TABS: 1.0000 | ORAL_TABLET | Freq: Every day | ORAL | Status: DC

## 2011-01-28 MED ORDER — TRAMADOL HCL 50 MG PO TABS: 50.0000 mg | ORAL_TABLET | Freq: Two times a day (BID) | ORAL | Status: DC | PRN

## 2011-01-28 NOTE — Telephone Encounter (Signed)
Sent to wrong [facility]- they were notified

## 2011-01-28 NOTE — Telephone Encounter (Signed)
Pharmacy can not accept escrips for Tramadol. Pls call to give verbal script. Also, need clarification on instructions for the Benazepril. Pls call asap today.

## 2011-02-10 ENCOUNTER — Telehealth: Payer: Self-pay | Admitting: *Deleted

## 2011-02-10 ENCOUNTER — Ambulatory Visit (INDEPENDENT_AMBULATORY_CARE_PROVIDER_SITE_OTHER): Payer: PRIVATE HEALTH INSURANCE | Admitting: Internal Medicine

## 2011-02-10 ENCOUNTER — Encounter: Payer: Self-pay | Admitting: Internal Medicine

## 2011-02-10 VITALS — BP 140/90 | HR 84 | Temp 98.2°F | Resp 16 | Ht 62.0 in | Wt 158.0 lb

## 2011-02-10 DIAGNOSIS — N3289 Other specified disorders of bladder: Secondary | ICD-10-CM | POA: Insufficient documentation

## 2011-02-10 DIAGNOSIS — I10 Essential (primary) hypertension: Secondary | ICD-10-CM

## 2011-02-10 DIAGNOSIS — M542 Cervicalgia: Secondary | ICD-10-CM

## 2011-02-10 DIAGNOSIS — S139XXA Sprain of joints and ligaments of unspecified parts of neck, initial encounter: Secondary | ICD-10-CM

## 2011-02-10 DIAGNOSIS — M5090 Cervical disc disorder, unspecified, unspecified cervical region: Secondary | ICD-10-CM

## 2011-02-10 MED ORDER — METHYLPREDNISOLONE ACETATE 40 MG/ML IJ SUSP: 40.0000 mg | Freq: Once | INTRAMUSCULAR | Status: DC

## 2011-02-10 NOTE — Telephone Encounter (Signed)
Ov given 
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
## 2011-02-19 ENCOUNTER — Other Ambulatory Visit (INDEPENDENT_AMBULATORY_CARE_PROVIDER_SITE_OTHER): Payer: PRIVATE HEALTH INSURANCE

## 2011-02-19 ENCOUNTER — Other Ambulatory Visit: Payer: Self-pay | Admitting: Internal Medicine

## 2011-02-19 DIAGNOSIS — E785 Hyperlipidemia, unspecified: Secondary | ICD-10-CM

## 2011-02-19 LAB — HEPATIC FUNCTION PANEL
ALT: 17 U/L (ref 0–35)
Albumin: 4.3 g/dL (ref 3.5–5.2)
Total Bilirubin: 0.4 mg/dL (ref 0.3–1.2)
Total Protein: 6.7 g/dL (ref 6.0–8.3)

## 2011-02-19 LAB — LIPID PANEL
Cholesterol: 263 mg/dL — ABNORMAL HIGH (ref 0–200)
Triglycerides: 174 mg/dL — ABNORMAL HIGH (ref 0.0–149.0)

## 2011-02-26 ENCOUNTER — Ambulatory Visit (INDEPENDENT_AMBULATORY_CARE_PROVIDER_SITE_OTHER): Payer: PRIVATE HEALTH INSURANCE | Admitting: Internal Medicine

## 2011-02-26 ENCOUNTER — Encounter: Payer: Self-pay | Admitting: Internal Medicine

## 2011-02-26 DIAGNOSIS — E785 Hyperlipidemia, unspecified: Secondary | ICD-10-CM

## 2011-02-26 DIAGNOSIS — I10 Essential (primary) hypertension: Secondary | ICD-10-CM

## 2011-02-26 NOTE — Progress Notes (Signed)
  Subjective:    Patient ID: Jamie [name], female    DOB: 1930-03-16, 77 y.o.   MRN: [id number]  HPI Patient's followed for hyperlipidemia and hypertension as well as knee pain and osteoarthritis. She has been unable to tolerate statins however she has been on red rice she said the shoulder popped about exercise and agents.  She has been compliant with official but not with the red rice yeast  She has not been exercising her weight has not changed     Review of Systems  Constitutional: Negative for activity change, appetite change and fatigue.  HENT: Negative for ear pain, congestion, neck pain, postnasal drip and sinus pressure.   Eyes: Negative for redness and visual disturbance.  Respiratory: Negative for cough, shortness of breath and wheezing.   Gastrointestinal: Negative for abdominal pain and abdominal distention.  Genitourinary: Negative for dysuria, frequency and menstrual problem.  Musculoskeletal: Negative for myalgias, joint swelling and arthralgias.  Skin: Negative for rash and wound.  Neurological: Negative for dizziness, weakness and headaches.  Hematological: Negative for adenopathy. Does not bruise/bleed easily.  Psychiatric/Behavioral: Negative for sleep disturbance and decreased concentration.   Past Medical History  Diagnosis Date  . Hyperlipidemia   . Hypertension   . Obesity    Past Surgical History  Procedure Date  . Bcca of nose     reports that she quit smoking about 39 years ago. She has never used smokeless tobacco. She reports that she drinks alcohol. She reports that she does not use illicit drugs. family history includes Hyperlipidemia in her mother and Hypertension in her mother. No Known Allergies     Objective:   Physical Exam  Constitutional: She is oriented to person, place, and time. She appears well-developed and well-nourished. No distress.  HENT:  Head: Normocephalic and atraumatic.  Right Ear: External ear normal.  Left Ear:  External ear normal.  Nose: Nose normal.  Mouth/Throat: Oropharynx is clear and moist.  Eyes: Conjunctivae and EOM are normal. Pupils are equal, round, and reactive to light.  Neck: Normal range of motion. Neck supple. No JVD present. No tracheal deviation present. No thyromegaly present.  Cardiovascular: Normal rate, regular rhythm, normal heart sounds and intact distal pulses.   No murmur heard. Pulmonary/Chest: Effort normal and breath sounds normal. She has no wheezes. She exhibits no tenderness.  Abdominal: Soft. Bowel sounds are normal.  Musculoskeletal: She exhibits edema and tenderness.  Lymphadenopathy:    She has no cervical adenopathy.  Neurological: She is alert and oriented to person, place, and time. She has normal reflexes. No cranial nerve deficit.  Skin: Skin is warm and dry. She is not diaphoretic.  Psychiatric: She has a normal mood and affect. Her behavior is normal.          Assessment & Plan:  Her cholesterol has decreased and her HDL has increased with the visual we recommended that she be compliant with twice daily red rice yeast we also recommend weight loss and exercise program she has the ability to her insurance to join the [facility] without feces we encouraged her to join the [facility] [employer] program to begin an exercise program that includes aerobic exercise and potentially water exercise.   The blood pressure stable today

## 2011-04-17 ENCOUNTER — Ambulatory Visit (INDEPENDENT_AMBULATORY_CARE_PROVIDER_SITE_OTHER): Payer: PRIVATE HEALTH INSURANCE | Admitting: Internal Medicine

## 2011-04-17 ENCOUNTER — Encounter: Payer: Self-pay | Admitting: Internal Medicine

## 2011-04-17 ENCOUNTER — Telehealth: Payer: Self-pay | Admitting: *Deleted

## 2011-04-17 DIAGNOSIS — R233 Spontaneous ecchymoses: Secondary | ICD-10-CM

## 2011-04-17 DIAGNOSIS — I10 Essential (primary) hypertension: Secondary | ICD-10-CM

## 2011-04-17 NOTE — Patient Instructions (Signed)
Call or return to clinic prn if these symptoms worsen or fail to improve as anticipated.  Follow up with your eye doctor as planned

## 2011-04-17 NOTE — Progress Notes (Signed)
  Subjective:    Patient ID: Jamie [name], female    DOB: 1930/04/29, 77 y.o.   MRN: [id number]  HPI  77 year old patient who awoke this morning noting a bruise in the right infraorbital area. 2 weeks ago she obtained new glasses and she has felt some increasing pressure involving the right nasal region. She is scheduled for followup with her eye doctor at [facility] tomorrow. There's been no eye pain or change in her visual acuity    Review of Systems  Skin: Positive for rash.       Objective:   Physical Exam  Constitutional: She appears well-developed and well-nourished. No distress.  Eyes: Conjunctivae and EOM are normal. Pupils are equal, round, and reactive to light. Left eye exhibits no discharge. No scleral icterus.  Skin: Rash noted.       A bruise was noted in the right infraorbital area          Assessment & Plan:   Ecchymoses. She will follow up with her eye doctor and perhaps have her glasses adjusted. She was reassured. Hypertension stable. Repeat blood pressure 130/80

## 2011-04-17 NOTE — Telephone Encounter (Signed)
Ov with dr Kirtland Bouchard today- pt was very insistent on being seen

## 2011-04-17 NOTE — Telephone Encounter (Signed)
Pt woke up with a dark bruise under right eye.  No pain.  Possibly a insect bite?  Would like Dr. Lovell Sheehan to look at it.

## 2011-04-28 ENCOUNTER — Other Ambulatory Visit: Payer: Self-pay | Admitting: Internal Medicine

## 2011-04-29 ENCOUNTER — Telehealth: Payer: Self-pay | Admitting: Internal Medicine

## 2011-04-29 NOTE — Telephone Encounter (Signed)
Pt left message on triage line. States she is on Benazepril. Apparently, she read an article on its side effects and is "horrified and very scared". She said she went to the eye doctor last time and he told her she has glaucoma, which she says is a side effect. She wants to know if she can stop this med/switch. Please call and advise.

## 2011-04-30 ENCOUNTER — Other Ambulatory Visit: Payer: Self-pay | Admitting: *Deleted

## 2011-04-30 MED ORDER — BISOPROLOL-HYDROCHLOROTHIAZIDE 5-6.25 MG PO TABS: 1.0000 | ORAL_TABLET | Freq: Every day | ORAL | Status: DC

## 2011-04-30 NOTE — Telephone Encounter (Signed)
Called again.......

## 2011-04-30 NOTE — Telephone Encounter (Signed)
Per dr Lovell Sheehan change to ziac 5/12.5 --pt informed and med sent in--

## 2011-04-30 NOTE — Telephone Encounter (Signed)
Pt called back to check on status of getting a different med other than Benazepril. Pt says that she has taken a 1/2 tab of  bisoprolol (ZEBETA) 5 MG tablet this a.m. Pt is wanting to know what she should do? Pls call in a diff med asap to [facility].

## 2011-04-30 NOTE — Telephone Encounter (Signed)
Please advise 

## 2011-05-03 DIAGNOSIS — K259 Gastric ulcer, unspecified as acute or chronic, without hemorrhage or perforation: Secondary | ICD-10-CM

## 2011-05-03 HISTORY — DX: Gastric ulcer, unspecified as acute or chronic, without hemorrhage or perforation: K25.9

## 2011-05-15 ENCOUNTER — Other Ambulatory Visit (INDEPENDENT_AMBULATORY_CARE_PROVIDER_SITE_OTHER): Payer: PRIVATE HEALTH INSURANCE

## 2011-05-15 DIAGNOSIS — I10 Essential (primary) hypertension: Secondary | ICD-10-CM

## 2011-05-15 DIAGNOSIS — E785 Hyperlipidemia, unspecified: Secondary | ICD-10-CM

## 2011-05-15 DIAGNOSIS — Z0000 Encounter for general adult medical examination without abnormal findings: Secondary | ICD-10-CM

## 2011-05-15 LAB — CBC WITH DIFFERENTIAL/PLATELET
Eosinophils Absolute: 0.3 10*3/uL (ref 0.0–0.7)
HCT: 26.8 % — ABNORMAL LOW (ref 36.0–46.0)
Lymphs Abs: 1.2 10*3/uL (ref 0.7–4.0)
MCHC: 31 g/dL (ref 30.0–36.0)
MCV: 67 fl — ABNORMAL LOW (ref 78.0–100.0)
Monocytes Absolute: 0.5 10*3/uL (ref 0.1–1.0)
Neutrophils Relative %: 63.6 % (ref 43.0–77.0)
Platelets: 418 10*3/uL — ABNORMAL HIGH (ref 150.0–400.0)

## 2011-05-15 LAB — POCT URINALYSIS DIPSTICK
Bilirubin, UA: NEGATIVE
Blood, UA: NEGATIVE
Nitrite, UA: NEGATIVE
Spec Grav, UA: 1.015
pH, UA: 8.5

## 2011-05-15 LAB — BASIC METABOLIC PANEL
BUN: 19 mg/dL (ref 6–23)
CO2: 28 mEq/L (ref 19–32)
Chloride: 106 mEq/L (ref 96–112)
Creatinine, Ser: 0.8 mg/dL (ref 0.4–1.2)
Glucose, Bld: 88 mg/dL (ref 70–99)

## 2011-05-15 LAB — HEPATIC FUNCTION PANEL
Bilirubin, Direct: 0 mg/dL (ref 0.0–0.3)
Total Bilirubin: 0.5 mg/dL (ref 0.3–1.2)
Total Protein: 6.9 g/dL (ref 6.0–8.3)

## 2011-05-15 LAB — LIPID PANEL
Cholesterol: 229 mg/dL — ABNORMAL HIGH (ref 0–200)
Total CHOL/HDL Ratio: 4
Triglycerides: 122 mg/dL (ref 0.0–149.0)
VLDL: 24.4 mg/dL (ref 0.0–40.0)

## 2011-05-26 ENCOUNTER — Ambulatory Visit (INDEPENDENT_AMBULATORY_CARE_PROVIDER_SITE_OTHER): Payer: PRIVATE HEALTH INSURANCE | Admitting: Internal Medicine

## 2011-05-26 ENCOUNTER — Encounter: Payer: Self-pay | Admitting: Internal Medicine

## 2011-05-26 VITALS — BP 180/86 | HR 80 | Temp 98.2°F | Resp 16 | Ht 62.0 in | Wt 154.0 lb

## 2011-05-26 DIAGNOSIS — I10 Essential (primary) hypertension: Secondary | ICD-10-CM

## 2011-05-26 DIAGNOSIS — M25519 Pain in unspecified shoulder: Secondary | ICD-10-CM

## 2011-05-26 DIAGNOSIS — D649 Anemia, unspecified: Secondary | ICD-10-CM

## 2011-05-26 DIAGNOSIS — Z0000 Encounter for general adult medical examination without abnormal findings: Secondary | ICD-10-CM

## 2011-05-26 DIAGNOSIS — Z23 Encounter for immunization: Secondary | ICD-10-CM

## 2011-05-26 DIAGNOSIS — T887XXA Unspecified adverse effect of drug or medicament, initial encounter: Secondary | ICD-10-CM

## 2011-05-26 DIAGNOSIS — E785 Hyperlipidemia, unspecified: Secondary | ICD-10-CM

## 2011-05-26 LAB — CBC
HCT: 46
Hemoglobin: 16.1 — ABNORMAL HIGH
MCHC: 35
MCV: 87.2
Platelets: 261
RDW: 14.8

## 2011-05-26 LAB — COMPREHENSIVE METABOLIC PANEL
ALT: 22
AST: 22
Albumin: 4.2
Alkaline Phosphatase: 71
GFR calc Af Amer: >60
Glucose, Bld: 98
Potassium: 4.6
Sodium: 142
Total Protein: 7

## 2011-05-26 LAB — DIFFERENTIAL
Basophils Relative: 0
Eosinophils Absolute: 0.1
Eosinophils Relative: 1
Lymphs Abs: 1.7
Monocytes Absolute: 0.5
Monocytes Relative: 6
Neutrophils Relative %: 70

## 2011-05-26 LAB — CBC WITH DIFFERENTIAL/PLATELET
Basophils Relative: 0.2 % (ref 0.0–3.0)
Eosinophils Relative: 3.2 % (ref 0.0–5.0)
HCT: 26.4 % — ABNORMAL LOW (ref 36.0–46.0)
Hemoglobin: 8.2 g/dL — ABNORMAL LOW (ref 12.0–15.0)
Lymphs Abs: 1.6 10*3/uL (ref 0.7–4.0)
MCV: 64.9 fl — ABNORMAL LOW (ref 78.0–100.0)
Monocytes Absolute: 0.6 10*3/uL (ref 0.1–1.0)
Neutro Abs: 4.2 10*3/uL (ref 1.4–7.7)
Platelets: 420 10*3/uL — ABNORMAL HIGH (ref 150.0–400.0)
RBC: 4.07 Mil/uL (ref 3.87–5.11)
WBC: 6.6 10*3/uL (ref 4.5–10.5)

## 2011-05-26 MED ORDER — HYDROCODONE-ACETAMINOPHEN 5-500 MG PO TABS: 2.0000 | ORAL_TABLET | Freq: Four times a day (QID) | ORAL | Status: DC | PRN
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
## 2011-05-26 NOTE — Patient Instructions (Signed)
For your blood pressure you should take the bisoprolol and stop the ACE inhibitor (that we threw away) for your pain we will stop the tramadol and give you a prescription for the hydrocodone

## 2011-05-27 ENCOUNTER — Other Ambulatory Visit: Payer: Self-pay | Admitting: Internal Medicine

## 2011-05-27 ENCOUNTER — Telehealth: Payer: Self-pay | Admitting: *Deleted

## 2011-05-27 DIAGNOSIS — D649 Anemia, unspecified: Secondary | ICD-10-CM

## 2011-05-27 NOTE — Telephone Encounter (Signed)
Pt. Is concerned about CBC from yesterday, and would like to be called with her results today ASAP.

## 2011-05-27 NOTE — Telephone Encounter (Signed)
Pt informed and stool cards given- call in to dr biccini for ov and will start iron 325 bid

## 2011-06-03 ENCOUNTER — Other Ambulatory Visit: Payer: PRIVATE HEALTH INSURANCE

## 2011-06-06 ENCOUNTER — Telehealth: Payer: Self-pay | Admitting: *Deleted

## 2011-06-06 NOTE — Telephone Encounter (Signed)
Pt calls stating she has been taking B12 SL, and she read in a medical book, the B12 will not absorb if she is anemic?  Does Dr. Lovell Sheehan want her to be treated with B12, and if so, which kind?

## 2011-06-06 NOTE — Telephone Encounter (Signed)
That is why in most cases we like to use the sublingual B12 she may find this at the pharmacist called B12 dots they're 500 mg and strength that she should take at least one a day  The sublingual absorbs better when you are anemic

## 2011-06-06 NOTE — Telephone Encounter (Signed)
Pt.notified

## 2011-06-13 ENCOUNTER — Other Ambulatory Visit: Payer: Self-pay | Admitting: Gastroenterology

## 2011-07-14 ENCOUNTER — Other Ambulatory Visit: Payer: Self-pay | Admitting: Internal Medicine

## 2011-07-15 ENCOUNTER — Encounter: Payer: Self-pay | Admitting: Internal Medicine

## 2011-09-03 ENCOUNTER — Other Ambulatory Visit: Payer: Self-pay | Admitting: Internal Medicine

## 2011-09-08 ENCOUNTER — Telehealth: Payer: Self-pay | Admitting: Internal Medicine

## 2011-09-08 NOTE — Telephone Encounter (Signed)
Yes may take aleve--please minimize vicodan- pt states she has been taking 2 every 6 hours- didn't see the as needed--take tylenol pm for sleep- pt informed

## 2011-09-08 NOTE — Telephone Encounter (Signed)
Pt does not like the way vicodin makes her feel. Pt would like to know can she take advil twice a days instead. Pt would like to know can she take vicodin once a night to help her sleep.

## 2011-09-25 ENCOUNTER — Ambulatory Visit: Payer: PRIVATE HEALTH INSURANCE | Admitting: Internal Medicine

## 2011-10-28 ENCOUNTER — Ambulatory Visit: Payer: PRIVATE HEALTH INSURANCE | Admitting: Internal Medicine

## 2011-11-17 ENCOUNTER — Ambulatory Visit (INDEPENDENT_AMBULATORY_CARE_PROVIDER_SITE_OTHER): Payer: PRIVATE HEALTH INSURANCE | Admitting: Internal Medicine

## 2011-11-17 ENCOUNTER — Encounter: Payer: Self-pay | Admitting: Internal Medicine

## 2011-11-17 VITALS — BP 190/100 | HR 88 | Temp 98.1°F | Resp 16 | Ht 62.0 in | Wt 162.0 lb

## 2011-11-17 DIAGNOSIS — M1710 Unilateral primary osteoarthritis, unspecified knee: Secondary | ICD-10-CM

## 2011-11-17 DIAGNOSIS — M170 Bilateral primary osteoarthritis of knee: Secondary | ICD-10-CM

## 2011-11-17 DIAGNOSIS — I10 Essential (primary) hypertension: Secondary | ICD-10-CM

## 2011-11-17 DIAGNOSIS — E785 Hyperlipidemia, unspecified: Secondary | ICD-10-CM

## 2011-11-17 DIAGNOSIS — F411 Generalized anxiety disorder: Secondary | ICD-10-CM

## 2011-11-17 DIAGNOSIS — J3089 Other allergic rhinitis: Secondary | ICD-10-CM

## 2011-11-17 MED ORDER — TRAMADOL HCL 50 MG PO TABS: 50.0000 mg | ORAL_TABLET | Freq: Two times a day (BID) | ORAL | Status: DC | PRN

## 2011-11-17 NOTE — Patient Instructions (Addendum)
The patient is instructed to continue all medications as prescribed. Schedule followup with check out clerk upon leaving the clinic Because of your history of an ulcer you should stay away from all nonsteroidal-type drugs these include Aleve ibuprofen and Naprosyn Motrin and aspirin. 4 acute pain he may take Tylenol but if the pain is more severe I will give you a prescription for a drug called ultram

## 2011-11-17 NOTE — Progress Notes (Signed)
  Subjective:    Patient ID: Jamie [name], female    DOB: 1930-06-03, 78 y.o.   MRN: [id number]  HPI The patient is an 78 year old female who was recently seen by gastroenterology for an ulcer.  She was treated and has had monitoring CBC with differential which shows a normal hemoglobin and hematocrit but she does entail low or higher level she has taken iron supplements to replace.  She has a followup visit with the gastroenterologist next month. Is also followed for hyperlipidemia History of hypertension a history of osteoarthritis.  She's been taking Aleve for osteoarthritis which is contraindicated because of her history of ulcer disease.  We will discuss what medications she should avoid taking even though they were over-the-counter because of her history of ulcers  Review of Systems  Constitutional: Negative for activity change, appetite change and fatigue.  HENT: Negative for ear pain, congestion, neck pain, postnasal drip and sinus pressure.   Eyes: Negative for redness and visual disturbance.  Respiratory: Negative for cough, shortness of breath and wheezing.   Gastrointestinal: Negative for abdominal pain and abdominal distention.  Genitourinary: Negative for dysuria, frequency and menstrual problem.  Musculoskeletal: Negative for myalgias, joint swelling and arthralgias.  Skin: Negative for rash and wound.  Neurological: Negative for dizziness, weakness and headaches.  Hematological: Negative for adenopathy. Does not bruise/bleed easily.  Psychiatric/Behavioral: Negative for sleep disturbance and decreased concentration.       Objective:   Physical Exam  Constitutional: She is oriented to person, place, and time. She appears well-developed and well-nourished. No distress.  HENT:  Head: Normocephalic and atraumatic.  Right Ear: External ear normal.  Left Ear: External ear normal.  Nose: Nose normal.  Mouth/Throat: Oropharynx is clear and moist.  Eyes: Conjunctivae and  EOM are normal. Pupils are equal, round, and reactive to light.  Neck: Normal range of motion. Neck supple. No JVD present. No tracheal deviation present. No thyromegaly present.  Cardiovascular: Normal rate, regular rhythm, normal heart sounds and intact distal pulses.   No murmur heard. Pulmonary/Chest: Effort normal and breath sounds normal. She has no wheezes. She exhibits no tenderness.  Abdominal: Soft. Bowel sounds are normal.  Musculoskeletal: Normal range of motion. She exhibits no edema and no tenderness.  Lymphadenopathy:    She has no cervical adenopathy.  Neurological: She is alert and oriented to person, place, and time. She has normal reflexes. No cranial nerve deficit.  Skin: Skin is warm and dry. She is not diaphoretic.  Psychiatric: She has a normal mood and affect. Her behavior is normal.          Assessment & Plan:  Followup for blood pressure issues but his reading was high possibly due to anxiety with an elevated pulse a repeat blood pressure showed 145/80.  The patient's anemia appears to respond to iron therapy she has good coloration and is feeling much improved.  We talked about avoiding all nonsteroidal drugs and gave her a list of drugs that she should avoid we are going to give her Ultram for arthritis pain she will followup with a lipid and liver in 2-3 months time

## 2011-12-30 ENCOUNTER — Ambulatory Visit (INDEPENDENT_AMBULATORY_CARE_PROVIDER_SITE_OTHER): Payer: PRIVATE HEALTH INSURANCE | Admitting: Family

## 2011-12-30 ENCOUNTER — Encounter: Payer: Self-pay | Admitting: Family

## 2011-12-30 VITALS — BP 198/104 | HR 100 | Temp 98.5°F | Wt 164.0 lb

## 2011-12-30 DIAGNOSIS — R319 Hematuria, unspecified: Secondary | ICD-10-CM

## 2011-12-30 DIAGNOSIS — N3289 Other specified disorders of bladder: Secondary | ICD-10-CM

## 2011-12-30 LAB — POCT URINALYSIS DIPSTICK
Bilirubin, UA: NEGATIVE
Glucose, UA: NEGATIVE
Ketones, UA: NEGATIVE
Leukocytes, UA: NEGATIVE
Spec Grav, UA: 1.01

## 2011-12-30 NOTE — Progress Notes (Signed)
Subjective:    Patient ID: Jamie [name], female    DOB: 07-16-1930, 78 y.o.   MRN: [id number]  HPI 78 year old white female, nonsmoker, patient of Dr. Lovell Sheehan is in for recheck of hematuria. She was seen in an urgent care clinic 2 days ago and found to have 3+ hematuria on urine dip. Patient reports having a dark color urine and low back pain. No bacteria or signs and symptoms of a urinary tract infection were noted. She was advised to return to her PCP for referral to urology. Since then, her symptoms have completely resolved. She no longer has any blood in her urine and no back pain.   Review of Systems  Constitutional: Negative.   Respiratory: Negative.   Cardiovascular: Negative.   Gastrointestinal: Negative.   Genitourinary: Positive for hematuria.       No current hematuria  Musculoskeletal: Negative.   Skin: Negative.   Psychiatric/Behavioral: Negative.    Past Medical History  Diagnosis Date  . Hyperlipidemia   . Hypertension   . Obesity     History   Social History  . Marital Status: Single    Spouse Name: N/A    Number of Children: N/A  . Years of Education: N/A   Occupational History  . Not on file.   Social History Main Topics  . Smoking status: Former Smoker    Quit date: 09/02/1971  . Smokeless tobacco: Never Used  . Alcohol Use: Yes  . Drug Use: No  . Sexually Active: Not Currently   Other Topics Concern  . Not on file   Social History Narrative  . No narrative on file    Past Surgical History  Procedure Date  . Bcca of nose     Family History  Problem Relation Age of Onset  . Hypertension Mother   . Hyperlipidemia Mother     No Known Allergies  Current Outpatient Prescriptions on File Prior to Visit  Medication Sig Dispense Refill  . benazepril-hydrochlorthiazide (LOTENSIN HCT) 20-12.5 MG per tablet Take 1 tablet by mouth daily.      . Cholecalciferol (VITAMIN D) 1000 UNITS capsule Take 1,000 Units by mouth daily.        .  Cyanocobalamin (B-12 DOTS) 500 MCG SUBL Place 1 tablet under the tongue daily.       . ferrous sulfate 325 (65 FE) MG tablet Take 325 mg by mouth daily with breakfast. 3 days a week      . Red Yeast Rice 600 MG CAPS Take 1 capsule (600 mg total) by mouth 2 (two) times daily.      . traMADol (ULTRAM) 50 MG tablet Take 1 tablet (50 mg total) by mouth 2 (two) times daily as needed for pain.  50 tablet  3  . DISCONTD: bisoprolol-hydrochlorothiazide (ZIAC) 5-6.25 MG per tablet Take 1 tablet by mouth daily.  30 tablet  6    BP 198/104  Pulse 100  Temp(Src) 98.5 F (36.9 C) (Oral)  Wt 164 lb (74.39 kg)chart     Objective:   Physical Exam  Constitutional: She is oriented to person, place, and time. She appears well-developed and well-nourished.  Neck: Normal range of motion. Neck supple.  Cardiovascular: Normal rate and normal heart sounds.   Pulmonary/Chest: Effort normal and breath sounds normal.  Abdominal: Soft. Bowel sounds are normal.  Musculoskeletal: Normal range of motion.  Neurological: She is alert and oriented to person, place, and time.  Skin: Skin is warm and dry.  Psychiatric: She  has a normal mood and affect.          Assessment & Plan:  Assessment: Hematuria-resolved, likely renal calculi  Plan: Since her symptoms have completely resolved I don't see the necessity of referring her to urology. As of note, patient has elevated blood pressure but it is noted that she has white coat syndrome. We will recheck her urine at her next visit with Dr. Lovell Sheehan just to be sure that everything has cleared. Otherwise as scheduled and when necessary.

## 2012-01-15 ENCOUNTER — Encounter: Payer: Self-pay | Admitting: Internal Medicine

## 2012-02-11 ENCOUNTER — Other Ambulatory Visit (INDEPENDENT_AMBULATORY_CARE_PROVIDER_SITE_OTHER): Payer: PRIVATE HEALTH INSURANCE

## 2012-02-11 DIAGNOSIS — E785 Hyperlipidemia, unspecified: Secondary | ICD-10-CM

## 2012-02-11 LAB — LIPID PANEL
Cholesterol: 294 mg/dL — ABNORMAL HIGH (ref 0–200)
Total CHOL/HDL Ratio: 5
Triglycerides: 288 mg/dL — ABNORMAL HIGH (ref 0.0–149.0)
VLDL: 57.6 mg/dL — ABNORMAL HIGH (ref 0.0–40.0)

## 2012-02-11 LAB — HEPATIC FUNCTION PANEL
ALT: 17 U/L (ref 0–35)
AST: 20 U/L (ref 0–37)
Bilirubin, Direct: 0 mg/dL (ref 0.0–0.3)
Total Bilirubin: 0.6 mg/dL (ref 0.3–1.2)

## 2012-02-11 LAB — LDL CHOLESTEROL, DIRECT: Direct LDL: 191.6 mg/dL

## 2012-02-18 ENCOUNTER — Encounter: Payer: Self-pay | Admitting: Internal Medicine

## 2012-02-18 ENCOUNTER — Ambulatory Visit (INDEPENDENT_AMBULATORY_CARE_PROVIDER_SITE_OTHER): Payer: PRIVATE HEALTH INSURANCE | Admitting: Internal Medicine

## 2012-02-18 VITALS — BP 160/90 | HR 72 | Temp 98.2°F | Resp 16 | Ht 63.0 in | Wt 162.0 lb

## 2012-02-18 DIAGNOSIS — M1710 Unilateral primary osteoarthritis, unspecified knee: Secondary | ICD-10-CM

## 2012-02-18 DIAGNOSIS — E785 Hyperlipidemia, unspecified: Secondary | ICD-10-CM

## 2012-02-18 DIAGNOSIS — T887XXA Unspecified adverse effect of drug or medicament, initial encounter: Secondary | ICD-10-CM

## 2012-02-18 DIAGNOSIS — M170 Bilateral primary osteoarthritis of knee: Secondary | ICD-10-CM

## 2012-02-18 MED ORDER — EZETIMIBE-SIMVASTATIN 10-10 MG PO TABS: 1.0000 | ORAL_TABLET | ORAL | Status: DC

## 2012-02-18 MED ORDER — TRAMADOL HCL 50 MG PO TABS: 50.0000 mg | ORAL_TABLET | Freq: Two times a day (BID) | ORAL | Status: DC | PRN

## 2012-02-18 NOTE — Progress Notes (Signed)
Subjective:    Patient ID: Jamie [name], female    DOB: 17-Aug-1930, 78 y.o.   MRN: [id number]  HPI  Th pt has been "sloopy with eating" She has been taking her blood pressure medications but only taking the "omega red" Blood pressure is elevated due to to diet. No chest pain or leg swelling or SOB This is a chronic compliance problem Has shoulder pain  Review of Systems  Constitutional: Negative for activity change, appetite change and fatigue.  HENT: Negative for ear pain, congestion, neck pain, postnasal drip and sinus pressure.   Eyes: Negative for redness and visual disturbance.  Respiratory: Negative for cough, shortness of breath and wheezing.   Gastrointestinal: Negative for abdominal pain and abdominal distention.  Genitourinary: Negative for dysuria, frequency and menstrual problem.  Musculoskeletal: Negative for myalgias, joint swelling and arthralgias.  Skin: Negative for rash and wound.  Neurological: Negative for dizziness, weakness and headaches.  Hematological: Negative for adenopathy. Does not bruise/bleed easily.  Psychiatric/Behavioral: Negative for disturbed wake/sleep cycle and decreased concentration.   Past Medical History  Diagnosis Date  . Hyperlipidemia   . Hypertension   . Obesity     History   Social History  . Marital Status: Single    Spouse Name: N/A    Number of Children: N/A  . Years of Education: N/A   Occupational History  . Not on file.   Social History Main Topics  . Smoking status: Former Smoker    Quit date: 09/02/1971  . Smokeless tobacco: Never Used  . Alcohol Use: Yes  . Drug Use: No  . Sexually Active: Not Currently   Other Topics Concern  . Not on file   Social History Narrative  . No narrative on file    Past Surgical History  Procedure Date  . Bcca of nose     Family History  Problem Relation Age of Onset  . Hypertension Mother   . Hyperlipidemia Mother     No Known Allergies  Current Outpatient  Prescriptions on File Prior to Visit  Medication Sig Dispense Refill  . benazepril-hydrochlorthiazide (LOTENSIN HCT) 20-12.5 MG per tablet Take 1 tablet by mouth daily.      . Cholecalciferol (VITAMIN D) 1000 UNITS capsule Take 1,000 Units by mouth daily.        . Cyanocobalamin (B-12 DOTS) 500 MCG SUBL Place 1 tablet under the tongue daily.       . Red Yeast Rice 600 MG CAPS Take 1 capsule (600 mg total) by mouth 2 (two) times daily.      Marland Kitchen DISCONTD: bisoprolol-hydrochlorothiazide (ZIAC) 5-6.25 MG per tablet Take 1 tablet by mouth daily.  30 tablet  6    BP 160/90  Pulse 72  Temp 98.2 F (36.8 C)  Resp 16  Ht 5\' 3"  (1.6 m)  Wt 162 lb (73.483 kg)  BMI 28.70 kg/m2       Objective:   Physical Exam  Nursing note and vitals reviewed. Constitutional: She is oriented to person, place, and time. She appears well-developed. No distress.       obese  HENT:  Head: Normocephalic and atraumatic.  Right Ear: External ear normal.  Left Ear: External ear normal.  Nose: Nose normal.  Mouth/Throat: Oropharynx is clear and moist.  Eyes: Conjunctivae and EOM are normal. Pupils are equal, round, and reactive to light.  Neck: Normal range of motion. Neck supple. No JVD present. No tracheal deviation present. No thyromegaly present.  Cardiovascular: Normal rate, regular  rhythm, normal heart sounds and intact distal pulses.   No murmur heard. Pulmonary/Chest: Effort normal and breath sounds normal. She has no wheezes. She exhibits no tenderness.  Abdominal: Soft. Bowel sounds are normal.  Musculoskeletal: Normal range of motion. She exhibits no edema and no tenderness.  Lymphadenopathy:    She has no cervical adenopathy.  Neurological: She is alert and oriented to person, place, and time. She has normal reflexes. No cranial nerve deficit.  Skin: Skin is warm and dry. She is not diaphoretic.  Psychiatric: She has a normal mood and affect. Her behavior is normal.          Assessment & Plan:    The trial is vytorin twice a week  As the primary intervention. Discussion of diet and weight control reviewed the use of tramadol for knee pain Exercise goal set.

## 2012-02-18 NOTE — Patient Instructions (Addendum)
The patient is instructed to continue all medications as prescribed. Schedule followup with check out clerk upon leaving the clinic "paleo" diet Practical paleo   vytorin on Monday and Friday night before bed

## 2012-02-18 NOTE — Addendum Note (Signed)
Addended by: Stacie Glaze on: 02/18/2012 09:56 AM   Modules accepted: Orders

## 2012-03-18 ENCOUNTER — Ambulatory Visit (INDEPENDENT_AMBULATORY_CARE_PROVIDER_SITE_OTHER): Payer: PRIVATE HEALTH INSURANCE | Admitting: Internal Medicine

## 2012-03-18 ENCOUNTER — Encounter: Payer: Self-pay | Admitting: Internal Medicine

## 2012-03-18 VITALS — BP 160/90 | HR 80 | Temp 98.6°F | Resp 16 | Ht 63.0 in | Wt 160.0 lb

## 2012-03-18 DIAGNOSIS — T887XXA Unspecified adverse effect of drug or medicament, initial encounter: Secondary | ICD-10-CM

## 2012-03-18 DIAGNOSIS — I10 Essential (primary) hypertension: Secondary | ICD-10-CM

## 2012-03-18 DIAGNOSIS — R002 Palpitations: Secondary | ICD-10-CM

## 2012-03-18 DIAGNOSIS — E785 Hyperlipidemia, unspecified: Secondary | ICD-10-CM

## 2012-03-18 MED ORDER — ATENOLOL 25 MG PO TABS: 12.5000 mg | ORAL_TABLET | Freq: Every day | ORAL | Status: DC

## 2012-03-18 MED ORDER — EZETIMIBE-SIMVASTATIN 10-20 MG PO TABS: 1.0000 | ORAL_TABLET | ORAL | Status: DC

## 2012-03-18 NOTE — Progress Notes (Signed)
Subjective:    Patient ID: Jamie [name], female    DOB: Jul 30, 1930, 78 y.o.   MRN: [id number]  HPI reviewed HTN and palpitations Discussed the  palpitations and there relationship to the elevations in BP she has noted when she become anxous    Review of Systems  Constitutional: Negative for activity change, appetite change and fatigue.  HENT: Negative for ear pain, congestion, neck pain, postnasal drip and sinus pressure.   Eyes: Negative for redness and visual disturbance.  Respiratory: Negative for cough, shortness of breath and wheezing.   Gastrointestinal: Negative for abdominal pain and abdominal distention.  Genitourinary: Negative for dysuria, frequency and menstrual problem.  Musculoskeletal: Negative for myalgias, joint swelling and arthralgias.  Skin: Negative for rash and wound.  Neurological: Negative for dizziness, weakness and headaches.  Hematological: Negative for adenopathy. Does not bruise/bleed easily.  Psychiatric/Behavioral: Negative for disturbed wake/sleep cycle and decreased concentration.   Past Medical History  Diagnosis Date  . Hyperlipidemia   . Hypertension   . Obesity     History   Social History  . Marital Status: Single    Spouse Name: N/A    Number of Children: N/A  . Years of Education: N/A   Occupational History  . Not on file.   Social History Main Topics  . Smoking status: Former Smoker    Quit date: 09/02/1971  . Smokeless tobacco: Never Used  . Alcohol Use: Yes  . Drug Use: No  . Sexually Active: Not Currently   Other Topics Concern  . Not on file   Social History Narrative  . No narrative on file    Past Surgical History  Procedure Date  . Bcca of nose     Family History  Problem Relation Age of Onset  . Hypertension Mother   . Hyperlipidemia Mother     No Known Allergies  Current Outpatient Prescriptions on File Prior to Visit  Medication Sig Dispense Refill  . benazepril-hydrochlorthiazide (LOTENSIN HCT)  20-12.5 MG per tablet Take 1 tablet by mouth daily.      . Cholecalciferol (VITAMIN D) 1000 UNITS capsule Take 1,000 Units by mouth daily.        . Cyanocobalamin (B-12 DOTS) 500 MCG SUBL Place 1 tablet under the tongue daily.       . Red Yeast Rice 600 MG CAPS Take 1 capsule (600 mg total) by mouth 2 (two) times daily.      . traMADol (ULTRAM) 50 MG tablet Take 1 tablet (50 mg total) by mouth 2 (two) times daily as needed for pain.  50 tablet  3  . atenolol (TENORMIN) 25 MG tablet Take 0.5 tablets (12.5 mg total) by mouth daily.  90 tablet  3  . DISCONTD: bisoprolol-hydrochlorothiazide (ZIAC) 5-6.25 MG per tablet Take 1 tablet by mouth daily.  30 tablet  6    BP 160/90  Pulse 80  Temp 98.6 F (37 C)  Resp 16  Ht 5\' 3"  (1.6 m)  Wt 160 lb (72.576 kg)  BMI 28.34 kg/m2       Objective:   Physical Exam  Nursing note and vitals reviewed. Constitutional: She is oriented to person, place, and time. She appears well-developed and well-nourished. No distress.  HENT:  Head: Normocephalic and atraumatic.  Right Ear: External ear normal.  Left Ear: External ear normal.  Nose: Nose normal.  Mouth/Throat: Oropharynx is clear and moist.  Eyes: Conjunctivae and EOM are normal. Pupils are equal, round, and reactive to light.  Neck: Normal range of motion. Neck supple. No JVD present. No tracheal deviation present. No thyromegaly present.  Cardiovascular: Normal rate, regular rhythm, normal heart sounds and intact distal pulses.   No murmur heard. Pulmonary/Chest: Effort normal and breath sounds normal. She has no wheezes. She exhibits no tenderness.  Abdominal: Soft. Bowel sounds are normal.  Musculoskeletal: Normal range of motion. She exhibits no edema and no tenderness.  Lymphadenopathy:    She has no cervical adenopathy.  Neurological: She is alert and oriented to person, place, and time. She has normal reflexes. No cranial nerve deficit.  Skin: Skin is warm and dry. She is not  diaphoretic.  Psychiatric: She has a normal mood and affect. Her behavior is normal.          Assessment & Plan:  Add low dose BB for rate and blood pressure  Control Has OA pain and anxiety over pain as well Reviewed diet and weight

## 2012-03-18 NOTE — Patient Instructions (Signed)
The patient is instructed to continue all medications as prescribed. Schedule followup with check out clerk upon leaving the clinic  

## 2012-05-13 ENCOUNTER — Other Ambulatory Visit: Payer: Self-pay | Admitting: Internal Medicine

## 2012-05-25 ENCOUNTER — Other Ambulatory Visit (INDEPENDENT_AMBULATORY_CARE_PROVIDER_SITE_OTHER): Payer: PRIVATE HEALTH INSURANCE

## 2012-05-25 DIAGNOSIS — T887XXA Unspecified adverse effect of drug or medicament, initial encounter: Secondary | ICD-10-CM

## 2012-05-25 DIAGNOSIS — E785 Hyperlipidemia, unspecified: Secondary | ICD-10-CM

## 2012-05-25 LAB — HEPATIC FUNCTION PANEL
Albumin: 4.1 g/dL (ref 3.5–5.2)
Alkaline Phosphatase: 73 U/L (ref 39–117)
Bilirubin, Direct: 0.1 mg/dL (ref 0.0–0.3)
Total Protein: 7.2 g/dL (ref 6.0–8.3)

## 2012-05-25 LAB — LIPID PANEL
HDL: 48.7 mg/dL (ref >39.00)
Triglycerides: 206 mg/dL — ABNORMAL HIGH (ref 0.0–149.0)
VLDL: 41.2 mg/dL — ABNORMAL HIGH (ref 0.0–40.0)

## 2012-06-01 ENCOUNTER — Encounter: Payer: Self-pay | Admitting: Internal Medicine

## 2012-06-01 ENCOUNTER — Ambulatory Visit (INDEPENDENT_AMBULATORY_CARE_PROVIDER_SITE_OTHER): Payer: PRIVATE HEALTH INSURANCE | Admitting: Internal Medicine

## 2012-06-01 VITALS — BP 130/78 | HR 72 | Temp 98.2°F | Resp 16 | Ht 63.0 in | Wt 160.0 lb

## 2012-06-01 DIAGNOSIS — E785 Hyperlipidemia, unspecified: Secondary | ICD-10-CM

## 2012-06-01 DIAGNOSIS — I83893 Varicose veins of bilateral lower extremities with other complications: Secondary | ICD-10-CM

## 2012-06-01 DIAGNOSIS — I83813 Varicose veins of bilateral lower extremities with pain: Secondary | ICD-10-CM

## 2012-06-01 NOTE — Progress Notes (Signed)
  Subjective:    Patient ID: Jamie [name], female    DOB: 1930/03/12, 78 y.o.   MRN: [id number]  HPI Patient is an 78 year old female who presents for followup of her hyperlipidemia.  She has improved almost all of her parameters and that her total cholesterol is lower her LDL cholesterol is lower and her triglycerides have improved.  Her weight is stable and we discussed continuing her current medications and trying to eliminate processed foods as a possible cause of inflammation and added cholesterol in her diet.     Review of Systems  Constitutional: Negative for activity change, appetite change and fatigue.  HENT: Negative for ear pain, congestion, neck pain, postnasal drip and sinus pressure.   Eyes: Negative for redness and visual disturbance.  Respiratory: Negative for cough, shortness of breath and wheezing.   Gastrointestinal: Negative for abdominal pain and abdominal distention.  Genitourinary: Negative for dysuria, frequency and menstrual problem.  Musculoskeletal: Positive for myalgias, joint swelling and arthralgias.  Skin: Negative for rash and wound.  Neurological: Negative for dizziness, weakness and headaches.  Hematological: Negative for adenopathy. Does not bruise/bleed easily.  Psychiatric/Behavioral: Negative for disturbed wake/sleep cycle and decreased concentration.       Objective:   Physical Exam  Nursing note and vitals reviewed. Constitutional: She is oriented to person, place, and time. She appears well-developed and well-nourished. No distress.  HENT:  Head: Normocephalic and atraumatic.  Right Ear: External ear normal.  Left Ear: External ear normal.  Nose: Nose normal.  Mouth/Throat: Oropharynx is clear and moist.  Eyes: Conjunctivae normal and EOM are normal. Pupils are equal, round, and reactive to light.  Neck: Normal range of motion. Neck supple. No JVD present. No tracheal deviation present. No thyromegaly present.  Cardiovascular: Normal  rate, regular rhythm, normal heart sounds and intact distal pulses.   No murmur heard. Pulmonary/Chest: Effort normal and breath sounds normal. She has no wheezes. She exhibits no tenderness.  Abdominal: Soft. Bowel sounds are normal.  Musculoskeletal: Normal range of motion. She exhibits no edema and no tenderness.  Lymphadenopathy:    She has no cervical adenopathy.  Neurological: She is alert and oriented to person, place, and time. She has normal reflexes. No cranial nerve deficit.  Skin: Skin is warm and dry. She is not diaphoretic.       Patient has severe varicosities greater on the left than the right leg  Psychiatric: She has a normal mood and affect. Her behavior is normal.          Assessment & Plan:  Reviewed diet Discussed painful varicosities on the left leg with increasing edema and we'll refer her to interventional radiology for evaluation of venous incompetence and to discuss potential ablation therapy Improved cholesterol but not yet at our target goal Followup physical in 3 months

## 2012-06-01 NOTE — Patient Instructions (Addendum)
"  practical paleo" Avoid processed foods.  Meat, veggies, fruit and nuts...... No processed  ( no additives preservatives and gluten)

## 2012-08-06 ENCOUNTER — Telehealth: Payer: Self-pay | Admitting: Internal Medicine

## 2012-08-06 NOTE — Telephone Encounter (Signed)
Pt called and is req call back from [facility] re: bp medication benazepril-hydrochlorthiazide (LOTENSIN HCT) 20-12.5 MG per tablet, experiencing tingling in legs and pain, not sleeping well. Pt said that she had some bisoprolol-hydrochlorothiazide (ZIAC) 5-6.25 MG per tablet left, so pt said that she started taking this med again. Also pt is req cortisone inj in knee. Pls call.

## 2012-08-06 NOTE — Telephone Encounter (Signed)
May take either med per dr Roger Kill must be consistent has ov 12-30 and he can give injections then

## 2012-08-11 ENCOUNTER — Observation Stay (HOSPITAL_COMMUNITY)
Admission: EM | Admit: 2012-08-11 | Discharge: 2012-08-12 | Disposition: A | Discharge status: Home / Self Care | Payer: PRIVATE HEALTH INSURANCE | Attending: Internal Medicine | Admitting: Internal Medicine

## 2012-08-11 ENCOUNTER — Encounter (HOSPITAL_COMMUNITY): Payer: Self-pay | Admitting: *Deleted

## 2012-08-11 ENCOUNTER — Other Ambulatory Visit (INDEPENDENT_AMBULATORY_CARE_PROVIDER_SITE_OTHER): Payer: PRIVATE HEALTH INSURANCE

## 2012-08-11 DIAGNOSIS — K922 Gastrointestinal hemorrhage, unspecified: Principal | ICD-10-CM

## 2012-08-11 DIAGNOSIS — E785 Hyperlipidemia, unspecified: Secondary | ICD-10-CM

## 2012-08-11 DIAGNOSIS — K31819 Angiodysplasia of stomach and duodenum without bleeding: Secondary | ICD-10-CM | POA: Insufficient documentation

## 2012-08-11 DIAGNOSIS — K2970 Gastritis, unspecified, without bleeding: Secondary | ICD-10-CM | POA: Insufficient documentation

## 2012-08-11 DIAGNOSIS — G8929 Other chronic pain: Secondary | ICD-10-CM | POA: Diagnosis present

## 2012-08-11 DIAGNOSIS — N289 Disorder of kidney and ureter, unspecified: Secondary | ICD-10-CM

## 2012-08-11 DIAGNOSIS — K269 Duodenal ulcer, unspecified as acute or chronic, without hemorrhage or perforation: Secondary | ICD-10-CM | POA: Insufficient documentation

## 2012-08-11 DIAGNOSIS — J3089 Other allergic rhinitis: Secondary | ICD-10-CM

## 2012-08-11 DIAGNOSIS — D473 Essential (hemorrhagic) thrombocythemia: Secondary | ICD-10-CM | POA: Insufficient documentation

## 2012-08-11 DIAGNOSIS — I10 Essential (primary) hypertension: Secondary | ICD-10-CM | POA: Diagnosis present

## 2012-08-11 DIAGNOSIS — E538 Deficiency of other specified B group vitamins: Secondary | ICD-10-CM | POA: Diagnosis present

## 2012-08-11 DIAGNOSIS — R011 Cardiac murmur, unspecified: Secondary | ICD-10-CM | POA: Diagnosis present

## 2012-08-11 DIAGNOSIS — Z0000 Encounter for general adult medical examination without abnormal findings: Secondary | ICD-10-CM

## 2012-08-11 DIAGNOSIS — K2960 Other gastritis without bleeding: Secondary | ICD-10-CM | POA: Insufficient documentation

## 2012-08-11 DIAGNOSIS — E669 Obesity, unspecified: Secondary | ICD-10-CM

## 2012-08-11 DIAGNOSIS — M170 Bilateral primary osteoarthritis of knee: Secondary | ICD-10-CM

## 2012-08-11 DIAGNOSIS — K2980 Duodenitis without bleeding: Secondary | ICD-10-CM | POA: Insufficient documentation

## 2012-08-11 DIAGNOSIS — D649 Anemia, unspecified: Secondary | ICD-10-CM

## 2012-08-11 DIAGNOSIS — K449 Diaphragmatic hernia without obstruction or gangrene: Secondary | ICD-10-CM | POA: Insufficient documentation

## 2012-08-11 DIAGNOSIS — K259 Gastric ulcer, unspecified as acute or chronic, without hemorrhage or perforation: Secondary | ICD-10-CM

## 2012-08-11 DIAGNOSIS — R002 Palpitations: Secondary | ICD-10-CM

## 2012-08-11 DIAGNOSIS — D62 Acute posthemorrhagic anemia: Secondary | ICD-10-CM | POA: Diagnosis present

## 2012-08-11 HISTORY — DX: Gastric ulcer, unspecified as acute or chronic, without hemorrhage or perforation: K25.9

## 2012-08-11 HISTORY — DX: Other chronic pain: G89.29

## 2012-08-11 LAB — CBC WITH DIFFERENTIAL/PLATELET
Basophils Absolute: 0 10*3/uL (ref 0.0–0.1)
Basophils Absolute: 0 10*3/uL (ref 0.0–0.1)
Basophils Relative: 0 % (ref 0–1)
Eosinophils Absolute: 0.1 10*3/uL (ref 0.0–0.7)
Eosinophils Absolute: 0.1 10*3/uL (ref 0.0–0.7)
Hemoglobin: 6.4 g/dL — CL (ref 12.0–15.0)
Lymphocytes Relative: 23 % (ref 12.0–46.0)
MCH: 25.4 pg — ABNORMAL LOW (ref 26.0–34.0)
MCHC: 31.2 g/dL (ref 30.0–36.0)
MCHC: 30.3 g/dL (ref 30.0–36.0)
Monocytes Absolute: 0.8 10*3/uL (ref 0.1–1.0)
Monocytes Relative: 6.9 % (ref 3.0–12.0)
Monocytes Relative: 8 % (ref 3–12)
Neutrophils Relative %: 68.7 % (ref 43.0–77.0)
Neutrophils Relative %: 71 % (ref 43–77)
Platelets: 531 10*3/uL — ABNORMAL HIGH (ref 150.0–400.0)
RDW: 16.4 % — ABNORMAL HIGH (ref 11.5–14.6)
RDW: 15 % (ref 11.5–15.5)

## 2012-08-11 LAB — POCT URINALYSIS DIPSTICK
Bilirubin, UA: NEGATIVE
Blood, UA: NEGATIVE
Glucose, UA: NEGATIVE
Ketones, UA: NEGATIVE
Spec Grav, UA: 1.015
Urobilinogen, UA: 0.2

## 2012-08-11 LAB — HEPATIC FUNCTION PANEL
AST: 18 U/L (ref 0–37)
Albumin: 3.8 g/dL (ref 3.5–5.2)
Alkaline Phosphatase: 56 U/L (ref 39–117)
Bilirubin, Direct: 0 mg/dL (ref 0.0–0.3)
Total Bilirubin: 0.6 mg/dL (ref 0.3–1.2)

## 2012-08-11 LAB — TROPONIN I: Troponin I: <0.3 ng/mL (ref <0.30)

## 2012-08-11 LAB — BASIC METABOLIC PANEL
CO2: 27 mEq/L (ref 19–32)
Calcium: 9.1 mg/dL (ref 8.4–10.5)
Creatinine, Ser: 1.2 mg/dL (ref 0.4–1.2)
GFR calc Af Amer: 37 mL/min — ABNORMAL LOW (ref >90)
GFR calc non Af Amer: 32 mL/min — ABNORMAL LOW (ref >90)
GFR: 47.68 mL/min — ABNORMAL LOW (ref >60.00)
Glucose, Bld: 128 mg/dL — ABNORMAL HIGH (ref 70–99)
Glucose, Bld: 99 mg/dL (ref 70–99)
Potassium: 3.8 mEq/L (ref 3.5–5.1)
Sodium: 140 mEq/L (ref 135–145)
Sodium: 138 mEq/L (ref 135–145)

## 2012-08-11 LAB — LIPID PANEL
Cholesterol: 165 mg/dL (ref 0–200)
HDL: 45 mg/dL (ref >39.00)
LDL Cholesterol: 96 mg/dL (ref 0–99)
Triglycerides: 120 mg/dL (ref 0.0–149.0)

## 2012-08-11 LAB — URINALYSIS, ROUTINE W REFLEX MICROSCOPIC
Hgb urine dipstick: NEGATIVE
Nitrite: NEGATIVE
Specific Gravity, Urine: 1.02 (ref 1.005–1.030)
Urobilinogen, UA: 0.2 mg/dL (ref 0.0–1.0)

## 2012-08-11 LAB — ABO/RH: ABO/RH(D): O POS

## 2012-08-11 MED ORDER — SIMVASTATIN 20 MG PO TABS
20.0000 mg | ORAL_TABLET | ORAL | Status: DC
  Filled 2012-08-11 (×2): qty 1

## 2012-08-11 MED ORDER — TRAMADOL HCL 50 MG PO TABS
50.0000 mg | ORAL_TABLET | Freq: Two times a day (BID) | ORAL | Status: DC | PRN
  Administered 2012-08-11: 50 mg via ORAL
  Filled 2012-08-11: qty 1

## 2012-08-11 MED ORDER — ONDANSETRON HCL 4 MG PO TABS: 4.0000 mg | ORAL_TABLET | Freq: Four times a day (QID) | ORAL | Status: DC | PRN

## 2012-08-11 MED ORDER — ONDANSETRON HCL 4 MG/2ML IJ SOLN: 4.0000 mg | Freq: Four times a day (QID) | INTRAMUSCULAR | Status: DC | PRN

## 2012-08-11 MED ORDER — SODIUM CHLORIDE 0.9 % IV SOLN: INTRAVENOUS | Status: DC

## 2012-08-11 MED ORDER — VITAMIN D 1000 UNITS PO TABS
1000.0000 [IU] | ORAL_TABLET | Freq: Every day | ORAL | Status: DC
  Administered 2012-08-12: 1000 [IU] via ORAL
  Filled 2012-08-11: qty 1

## 2012-08-11 MED ORDER — EZETIMIBE 10 MG PO TABS
10.0000 mg | ORAL_TABLET | ORAL | Status: DC
  Filled 2012-08-11 (×2): qty 1

## 2012-08-11 MED ORDER — BENAZEPRIL-HYDROCHLOROTHIAZIDE 5-6.25 MG PO TABS: 1.0000 | ORAL_TABLET | Freq: Every day | ORAL | Status: DC

## 2012-08-11 MED ORDER — BENAZEPRIL HCL 5 MG PO TABS
5.0000 mg | ORAL_TABLET | Freq: Every day | ORAL | Status: DC
  Administered 2012-08-11: 5 mg via ORAL
  Filled 2012-08-11 (×3): qty 1

## 2012-08-11 MED ORDER — CYANOCOBALAMIN 500 MCG SL SUBL: 1.0000 | SUBLINGUAL_TABLET | Freq: Every day | SUBLINGUAL | Status: DC

## 2012-08-11 MED ORDER — ACETAMINOPHEN 325 MG PO TABS: 650.0000 mg | ORAL_TABLET | Freq: Four times a day (QID) | ORAL | Status: DC | PRN

## 2012-08-11 MED ORDER — HYDROCHLOROTHIAZIDE 10 MG/ML ORAL SUSPENSION
6.2500 mg | Freq: Every day | ORAL | Status: DC
  Filled 2012-08-11 (×4): qty 1.25

## 2012-08-11 MED ORDER — CYANOCOBALAMIN 500 MCG PO TABS
500.0000 ug | ORAL_TABLET | Freq: Every day | ORAL | Status: DC
  Administered 2012-08-12: 500 ug via ORAL
  Filled 2012-08-11: qty 1

## 2012-08-11 MED ORDER — SODIUM CHLORIDE 0.9 % IV SOLN
INTRAVENOUS | Status: DC
  Administered 2012-08-11: 20:00:00 via INTRAVENOUS
  Administered 2012-08-12: 500 mL via INTRAVENOUS

## 2012-08-11 MED ORDER — RED YEAST RICE 600 MG PO CAPS: 1.0000 | ORAL_CAPSULE | Freq: Two times a day (BID) | ORAL | Status: DC

## 2012-08-11 MED ORDER — SODIUM CHLORIDE 0.9 % IJ SOLN
3.0000 mL | Freq: Two times a day (BID) | INTRAMUSCULAR | Status: DC
  Administered 2012-08-12: 3 mL via INTRAVENOUS

## 2012-08-11 MED ORDER — ATENOLOL 12.5 MG HALF TABLET
12.5000 mg | ORAL_TABLET | Freq: Every day | ORAL | Status: DC
  Administered 2012-08-11 – 2012-08-12 (×2): 12.5 mg via ORAL
  Filled 2012-08-11 (×2): qty 1

## 2012-08-11 MED ORDER — ACETAMINOPHEN 650 MG RE SUPP: 650.0000 mg | Freq: Four times a day (QID) | RECTAL | Status: DC | PRN

## 2012-08-11 MED ORDER — EZETIMIBE-SIMVASTATIN 10-20 MG PO TABS: 1.0000 | ORAL_TABLET | ORAL | Status: DC

## 2012-08-11 NOTE — H&P (Signed)
[facility] History and Physical  Jamie [name] UJW:[id number] DOB: 01-28-1930 DOA: 08/11/2012  Referring physician: Marlon Pel PCP: Carrie Mew, MD   Chief Complaint:  Abnormal blood test, anemia  HPI:   The patient is a 78 yo F with history of hypertension, hyperlipidemia, and a bleeding gastric fundus ulcer 05/2011 followed by Dr. Matthias Hughs who was in her normal state of health until 3-4 weeks over.  Over the last few weeks, she has become progressively more fatigued, tired, and sluggish.  She has also been colder than usual and has been having tingling in her fingers and toes.  She was concerned that her blood pressure medication benazepril/HCTZ was contributing to her symptoms, so she discontinued the medication 3-4 days prior to admission.  Additionally, she has had increased flatulence and stools that have been more constipated and dark.  She has been taking metamucil and prunes to have BMs.  She has noticed pallor.  She denies abdominal pain, diarrhea, recent travel or antibiotics.  She had routine bloodwork done by her PCP today and she was found to be anemic so she was sent to the emergency department for evaluation.    In the ER, her hgb was 6.6mg /dl with a thrombocytosis and repeat hgb 5 hours later was 6.4mg /dl.  Her initial chemistry was normal, however, her repeat demonstrated increased BUN:Cr ratio.  Gastroenterology has been consulted.  She has been ordered a 1 unit PRBC transfusion by the ER.    Review of Systems:   Denies fevers, chills, changes to hearing or vision, sinus congestion, rhinorrhea, chest pain, shortness of breath with exertion.  She has been having pressure under her left arm with walking up or down stairs for the past few weeks.  Denies cough, nausea, vomiting, diarrhea, abdominal pain, dysuria.  She voids frequently.  Denies lymphadenopathy.  Has easy bruising, but no other unusual bleeding.  Denies focal numbness or weakness, anxiety and depression.     Past Medical History  Diagnosis Date  . Hyperlipidemia   . Hypertension   . Obesity     5'3"  . Gastric ulcer 05/2011  . Chronic pain    Past Surgical History  Procedure Date  . Bilateral oophorectomy   . Skin biopsy     nose lesion, noncancerous   Social History:  reports that she quit smoking about 40 years ago. Her smoking use included Cigarettes. She smoked 1 pack per day. She has never used smokeless tobacco. She reports that she drinks alcohol. She reports that she does not use illicit drugs.  Lives in a townhouse with steps.  Lives alone.  Ambulates without assist device.  Drives, pays own bills, and completes ADLs without assistance.    No Known Allergies  Family History  Problem Relation Age of Onset  . Hypertension Mother   . Hyperlipidemia Mother   . Heart disease Father     72s  . Cancer Brother    Prior to Admission medications   Medication Sig Start Date End Date Taking? Authorizing Provider  atenolol (TENORMIN) 25 MG tablet Take 0.5 tablets (12.5 mg total) by mouth daily. 03/18/12 03/18/13 Yes Stacie Glaze, MD  benazepril-hydrochlorthiazide (LOTENSIN HCT) 5-6.25 MG per tablet Take 1 tablet by mouth daily.   Yes Historical Provider, MD  Cholecalciferol (VITAMIN D) 1000 UNITS capsule Take 1,000 Units by mouth daily.     Yes Historical Provider, MD  Cyanocobalamin (B-12 DOTS) 500 MCG SUBL Place 1 tablet under the tongue daily.    Yes  Historical Provider, MD  ezetimibe-simvastatin (VYTORIN) 10-20 MG per tablet Take 1 tablet by mouth 3 (three) times a week.   Yes Historical Provider, MD  Red Yeast Rice 600 MG CAPS Take 1 capsule (600 mg total) by mouth 2 (two) times daily. 11/20/10  Yes Stacie Glaze, MD  traMADol (ULTRAM) 50 MG tablet Take 1 tablet (50 mg total) by mouth 2 (two) times daily as needed for pain. 02/18/12 02/17/13 Yes Stacie Glaze, MD   Physical Exam: Filed Vitals:   08/11/12 1324 08/11/12 1739 08/11/12 1800  BP: 161/65 143/77 143/68  Pulse: 83  83 78  Temp: 98.7 F (37.1 C) 98.3 F (36.8 C)   TempSrc: Oral Oral   Resp: 16 20 17   SpO2: 100% 98% 97%     General:  Caucasian female, no acute distress, pallor  Eyes: PERRL, anicteric, noninjected, mucosal pallor  ENT: Nares clear, OP nonerythematous with pale gums  Neck: supple, no thyromegaly  Lymph:  No cervical, submandibular, or supraclavicular LAD  Cardiovascular: RRR, 2/6 SEM at the RSB with radiation to the LSB.  2+ pulses  Respiratory: CTAB  Abdomen: NABS, soft, nondistended, nontender, no organomegaly  Skin: Pallor.  Several small bruises on her arms  Musculoskeletal: Normal tone and bulk, no LEE.  Her left calf and leg are slightly larger appearing than her right due to distension of veins.  Chronic issue and had work up with [place] previously  Psychiatric: A&Ox4  Neurologic: III-XII grossly intact, strength 5/5, sensation intact to light touch  Labs on Admission:  Basic Metabolic Panel:  Lab 08/11/12 6213 08/11/12 0908  NA 138 140  K 3.8 3.8  CL 101 103  CO2 28 27  GLUCOSE 99 128*  BUN 28* 23  CREATININE 1.49* 1.2  CALCIUM 9.6 9.1  MG -- --  PHOS -- --   Liver Function Tests:  Lab 08/11/12 0908  AST 18  ALT 14  ALKPHOS 56  BILITOT 0.6  PROT 6.7  ALBUMIN 3.8   No results found for this basename: LIPASE:5,AMYLASE:5 in the last 168 hours No results found for this basename: AMMONIA:5 in the last 168 hours CBC:  Lab 08/11/12 1400 08/11/12 0908  WBC 9.9 8.8  NEUTROABS 7.0 6.0  HGB 6.4* 6.6 Repeated and verified X2.*  HCT 21.1* 21.3 Repeated and verified X2.*  MCV 83.7 81.2  PLT 489* 531.0 Repeated and verified X2.*   Cardiac Enzymes:  Lab 08/11/12 1339  CKTOTAL --  CKMB --  CKMBINDEX --  TROPONINI <0.30    BNP (last 3 results) No results found for this basename: PROBNP:3 in the last 8760 hours CBG: No results found for this basename: GLUCAP:5 in the last 168 hours  Radiological Exams on Admission: No results  found.  Assessment/Plan Active Problems:  VITAMIN B12 DEFICIENCY  HYPERTENSION  Murmur, cardiac  Hyperlipidemia  Chronic pain  Acute blood loss anemia  Normocytic anemia, occult stool positive in patient with history of bleeding ulcer.  Will rule out other causes of anemia -  Admit to telemetry -  Transfuse 1 unit PRBC to goal hgb > 7mg /dl -  Y86, folate, TSH, SPEP -  Start iron tomorrow post procedure -  Appreciate GI assistance -  Plan for EGD in AM, then discharge to home  Left arm pain:  May be mild atypical angina or muscle demand in the setting of anemia.  Transfuse and start iron Murmur:  Likely combination of aortic sclerosis and anemia.  Likely benign Thrombocytosis:  Likely  due to acute bleeding and iron deficiency HTN:  Elevated BP, continue home medications HLD:  Defer to PCP, continue home medications Chronic pain:  Stable.  Continue tylenol and ultram  DIET:  Healthy heart, then NPO after MN ACCESS:  PIV IVF:  NS at 150ml/h PROPH:  SCDs  Code Status: full code Family Communication: spoke with patient and her  Disposition Plan:  Likely tomorrow post EGD  Time spent: 17  Renae Fickle [facility] Pager [phone]  If 7PM-7AM, please contact night-coverage [email or web address] Password [facility] 08/11/2012, 7:18 PM

## 2012-08-11 NOTE — Consult Note (Signed)
[facility] Consultation Note  Referring Provider: [facility] Primary Care Physician:  Carrie Mew, MD Primary Gastroenterologist:  Dr. Bernette Redbird  Reason for Consultation:  anemia  HPI: Jamie [name] is a 78 y.o. female who has had progressive fatigue.  Found to be anemic by her PCP, and was sent to ED.  She has no abdominal pain, change in bowel habits, loss-of-appetite, unintentional weight loss, overt blood in stool, overt blood in urine, overt vaginal bleeding.  Has history of gastric ulcer seen on EGD to assess for anemia in October 2012; large hiatal hernia also seen at that time; HP biopsies negative; follow-up EGD in a few months showed resolution of ulcer.  Colonoscopy October 2012 showed couple small adenomatous polyps, otherwise normal.   Past Medical History  Diagnosis Date  . Hyperlipidemia   . Hypertension   . Obesity     Past Surgical History  Procedure Date  . Bcca of nose   . Bilateral oophorectomy     Prior to Admission medications   Medication Sig Start Date End Date Taking? Authorizing Provider  atenolol (TENORMIN) 25 MG tablet Take 0.5 tablets (12.5 mg total) by mouth daily. 03/18/12 03/18/13 Yes Stacie Glaze, MD  benazepril-hydrochlorthiazide (LOTENSIN HCT) 5-6.25 MG per tablet Take 1 tablet by mouth daily.   Yes Historical Provider, MD  Cholecalciferol (VITAMIN D) 1000 UNITS capsule Take 1,000 Units by mouth daily.     Yes Historical Provider, MD  Cyanocobalamin (B-12 DOTS) 500 MCG SUBL Place 1 tablet under the tongue daily.    Yes Historical Provider, MD  ezetimibe-simvastatin (VYTORIN) 10-20 MG per tablet Take 1 tablet by mouth 3 (three) times a week.   Yes Historical Provider, MD  Red Yeast Rice 600 MG CAPS Take 1 capsule (600 mg total) by mouth 2 (two) times daily. 11/20/10  Yes Stacie Glaze, MD  traMADol (ULTRAM) 50 MG tablet Take 1 tablet (50 mg total) by mouth 2 (two) times daily as needed for pain. 02/18/12 02/17/13 Yes Stacie Glaze, MD    Current Facility-Administered Medications  Medication Dose Route Frequency Provider Last Rate Last Dose  . 0.9 %  sodium chloride infusion   Intravenous STAT Dorthula Matas, PA       Current Outpatient Prescriptions  Medication Sig Dispense Refill  . atenolol (TENORMIN) 25 MG tablet Take 0.5 tablets (12.5 mg total) by mouth daily.  90 tablet  3  . benazepril-hydrochlorthiazide (LOTENSIN HCT) 5-6.25 MG per tablet Take 1 tablet by mouth daily.      . Cholecalciferol (VITAMIN D) 1000 UNITS capsule Take 1,000 Units by mouth daily.        . Cyanocobalamin (B-12 DOTS) 500 MCG SUBL Place 1 tablet under the tongue daily.       Marland Kitchen ezetimibe-simvastatin (VYTORIN) 10-20 MG per tablet Take 1 tablet by mouth 3 (three) times a week.      . Red Yeast Rice 600 MG CAPS Take 1 capsule (600 mg total) by mouth 2 (two) times daily.      . traMADol (ULTRAM) 50 MG tablet Take 1 tablet (50 mg total) by mouth 2 (two) times daily as needed for pain.  50 tablet  3  . [DISCONTINUED] bisoprolol-hydrochlorothiazide (ZIAC) 5-6.25 MG per tablet Take 1 tablet by mouth daily.  30 tablet  6    Allergies as of 08/11/2012  . (No Known Allergies)    Family History  Problem Relation Age of Onset  . Hypertension Mother   .  Hyperlipidemia Mother     History   Social History  . Marital Status: Single    Spouse Name: N/A    Number of Children: N/A  . Years of Education: N/A   Occupational History  . Not on file.   Social History Main Topics  . Smoking status: Former Smoker    Quit date: 09/02/1971  . Smokeless tobacco: Never Used  . Alcohol Use: Yes     Comment: occasionally  . Drug Use: No  . Sexually Active: Not Currently   Other Topics Concern  . Not on file   Social History Narrative  . No narrative on file    Review of Systems: As per HPI; all others negative  Physical Exam: Vital signs in last 24 hours: Temp:  [98.3 F (36.8 C)-98.7 F (37.1 C)] 98.3 F (36.8 C) (12/11  1739) Pulse Rate:  [83] 83  (12/11 1739) Resp:  [16-20] 20  (12/11 1739) BP: (143-161)/(65-77) 143/77 mmHg (12/11 1739) SpO2:  [98 %-100 %] 98 % (12/11 1739)   General:   Alert,  Well-developed, well-nourished, younger-appearing than stated age, pleasant and cooperative in NAD Head:  Normocephalic and atraumatic. Eyes:  Sclera clear, no icterus.   Conjunctiva pale. Ears:  Normal auditory acuity. Nose:  No deformity, discharge,  or lesions. Mouth:  No deformity or lesions.  Oropharynx pink & moist. Neck:  Supple; no masses or thyromegaly. Lungs:  Clear throughout to auscultation.   No wheezes, crackles, or rhonchi. No acute distress. Heart:  Regular rate and rhythm; no murmurs, clicks, rubs,  or gallops. Abdomen:  Soft, nontender and nondistended. No masses, hepatosplenomegaly or hernias noted. Normal bowel sounds, without guarding, and without rebound.     Msk:  Symmetrical without gross deformities. Normal posture. Pulses:  Normal pulses noted. Extremities:  Without clubbing or edema. Neurologic:  Alert and  oriented x4;  Diffusely weak, otherwise grossly normal neurologically. Skin:  Scattered ecchymoses, otherwise intact without significant lesions or rashes. Psych:  Alert and cooperative. Normal mood and affect.   Lab Results:  Basename 08/11/12 1400 08/11/12 0908  WBC 9.9 8.8  HGB 6.4* 6.6 Repeated and verified X2.*  HCT 21.1* 21.3 Repeated and verified X2.*  PLT 489* 531.0 Repeated and verified X2.*   BMET  Basename 08/11/12 1400 08/11/12 0908  NA 138 140  K 3.8 3.8  CL 101 103  CO2 28 27  GLUCOSE 99 128*  BUN 28* 23  CREATININE 1.49* 1.2  CALCIUM 9.6 9.1   LFT  Basename 08/11/12 0908  PROT 6.7  ALBUMIN 3.8  AST 18  ALT 14  ALKPHOS 56  BILITOT 0.6  BILIDIR 0.0  IBILI --   PT/INR  Basename 08/11/12 1400  LABPROT 13.2  INR 1.01    Studies/Results: No results found.  Impression:  1.  Symptomatic anemia.  No overt GI blood loss.  Perhaps ulcer,  perhaps large hiatal hernia (with or without Cameron's erosions) could be playing role. 2.  History of gastric ulcer.  Plan:  1.  PPI. 2.  Solid diet tonight, NPO after midnight. 3.  Transfuse PRBCs. 4.  Endoscopy tomorrow; if no alarming findings seen, can likely discharge tomorrow after her endoscopy. 5.  Will follow; thank you for the consult.   LOS: 0 days   Jailon Schaible M  08/11/2012, 6:08 PM

## 2012-08-11 NOTE — ED Notes (Signed)
Pt reports feeling very weak, no energy for a few months. Saw PCP for annual exam today and had bloodwork taken. Called and was told to present to ED for Hgb 6.6.

## 2012-08-11 NOTE — Progress Notes (Signed)
PHARMACIST - PHYSICIAN ORDER COMMUNICATION  CONCERNING: P&T Medication Policy on Herbal Medications  DESCRIPTION:  This patient's order for:  Red Yeast Rice  has been noted.  This product(s) is classified as an "herbal" or natural product. Due to a lack of definitive safety studies or FDA approval, nonstandard manufacturing practices, plus the potential risk of unknown drug-drug interactions while on inpatient medications, the Pharmacy and Therapeutics Committee does not permit the use of "herbal" or natural products of this type within [facility].   ACTION TAKEN: The pharmacy department is unable to verify this order at this time and your patient has been informed of this safety policy. Please reevaluate patient's clinical condition at discharge and address if the herbal or natural product(s) should be resumed at that time.   

## 2012-08-11 NOTE — ED Provider Notes (Signed)
History     CSN: [id number]  Arrival date & time 08/11/12  1314   First MD Initiated Contact with Patient 08/11/12 1348      Chief Complaint  Patient presents with  . Anemia  . Abnormal Lab    (Consider location/radiation/quality/duration/timing/severity/associated sxs/prior treatment) HPI  Patient presents to the ED from PCP office after having labs drawn which showed a hemoglobin of 6.6. She says that she feels fine and wants to go home. She denies feeling bad or weak. She denies noticing any blood in her urine, vomit, or stool. She has not been vomiting or having abdominal pain. Dr. Matthias Hughs told her that she has an ulcer on an endoscopy. She has had a blood transfusion in the past because these ulcers were bleeding. nad vss  Past Medical History  Diagnosis Date  . Hyperlipidemia   . Hypertension   . Obesity     Past Surgical History  Procedure Date  . Bcca of nose   . Bilateral oophorectomy     Family History  Problem Relation Age of Onset  . Hypertension Mother   . Hyperlipidemia Mother     History  Substance Use Topics  . Smoking status: Former Smoker    Quit date: 09/02/1971  . Smokeless tobacco: Never Used  . Alcohol Use: Yes     Comment: occasionally    OB History    Grav Para Term Preterm Abortions TAB SAB Ect Mult Living                  Review of Systems  Review of Systems  Gen: no weight loss, fevers, chills, night sweats  Neck: no neck pain  Lungs:No wheezing, coughing or hemoptysis CV: no chest pain, palpitations, dependent edema or orthopnea  Abd: no abdominal pain, nausea, vomiting  GU: no dysuria or gross hematuria  MSK:  No abnormalities  Neuro: no headache, no focal neurologic deficits  Skin: no abnormalities Psyche: negative.   Allergies  Review of patient's allergies indicates no known allergies.  Home Medications   Current Outpatient Rx  Name  Route  Sig  Dispense  Refill  . ATENOLOL 25 MG PO TABS   Oral   Take 0.5  tablets (12.5 mg total) by mouth daily.   90 tablet   3   . BENAZEPRIL-HYDROCHLOROTHIAZIDE 5-6.25 MG PO TABS   Oral   Take 1 tablet by mouth daily.         Marland Kitchen VITAMIN D 1000 UNITS PO CAPS   Oral   Take 1,000 Units by mouth daily.           . CYANOCOBALAMIN 500 MCG SL SUBL   Sublingual   Place 1 tablet under the tongue daily.          Marland Kitchen EZETIMIBE-SIMVASTATIN 10-20 MG PO TABS   Oral   Take 1 tablet by mouth 3 (three) times a week.         . RED YEAST RICE 600 MG PO CAPS   Oral   Take 1 capsule (600 mg total) by mouth 2 (two) times daily.         . TRAMADOL HCL 50 MG PO TABS   Oral   Take 1 tablet (50 mg total) by mouth 2 (two) times daily as needed for pain.   50 tablet   3     BP 161/65  Pulse 83  Temp 98.7 F (37.1 C) (Oral)  Resp 16  SpO2 100%  Physical Exam  Nursing note and vitals reviewed. Constitutional: She appears well-developed and well-nourished. No distress.  HENT:  Head: Normocephalic and atraumatic.  Eyes: Pupils are equal, round, and reactive to light.  Neck: Normal range of motion. Neck supple.  Cardiovascular: Normal rate and regular rhythm.   Pulmonary/Chest: Effort normal.  Abdominal: Soft.  Genitourinary: Rectal exam shows no external hemorrhoid, no internal hemorrhoid, no fissure and no tenderness. Guaiac positive stool.  Neurological: She is alert.  Skin: Skin is warm and dry.    ED Course  Procedures (including critical care time)  Labs Reviewed  CBC WITH DIFFERENTIAL - Abnormal; Notable for the following:    RBC 2.52 (*)     Hemoglobin 6.4 (*)     HCT 21.1 (*)     MCH 25.4 (*)     Platelets 489 (*)     All other components within normal limits  BASIC METABOLIC PANEL - Abnormal; Notable for the following:    BUN 28 (*)     Creatinine, Ser 1.49 (*)     GFR calc non Af Amer 32 (*)     GFR calc Af Amer 37 (*)     All other components within normal limits  URINALYSIS, ROUTINE W REFLEX MICROSCOPIC - Abnormal; Notable for  the following:    Protein, ur 100 (*)     Leukocytes, UA TRACE (*)     All other components within normal limits  URINE MICROSCOPIC-ADD ON - Abnormal; Notable for the following:    Squamous Epithelial / LPF FEW (*)     Bacteria, UA FEW (*)     All other components within normal limits  PROTIME-INR  TYPE AND SCREEN  OCCULT BLOOD, POC DEVICE  ABO/RH  TROPONIN I  PREPARE RBC (CROSSMATCH)   No results found.   1. GI bleeding   2. Anemia requiring transfusions   3. Renal insufficiency       MDM  3:50pm- Dr. Randa Evens with GI has agreed to consult on patient and plans to see her this evening.   5:25- PRB ordered. I spoke with [facility] who has agreed to admit.   Pt remained stable throughout her visit in the ER. Dr. Rulon Abide have had face to face  Contact with patient and agrees with my plans.  [facility], inpatient, Team 3, Dr. Malachi Bonds, Telemetry.        Dorthula Matas, PA 08/11/12 1726

## 2012-08-12 ENCOUNTER — Encounter (HOSPITAL_COMMUNITY)
Admission: EM | Disposition: A | Discharge status: Home / Self Care | Payer: Self-pay | Source: Home / Self Care | Attending: Internal Medicine

## 2012-08-12 DIAGNOSIS — K922 Gastrointestinal hemorrhage, unspecified: Secondary | ICD-10-CM

## 2012-08-12 HISTORY — PX: ESOPHAGOGASTRODUODENOSCOPY: SHX5428

## 2012-08-12 LAB — BASIC METABOLIC PANEL
BUN: 21 mg/dL (ref 6–23)
CO2: 28 mEq/L (ref 19–32)
Calcium: 9.3 mg/dL (ref 8.4–10.5)
Creatinine, Ser: 1 mg/dL (ref 0.50–1.10)
GFR calc non Af Amer: 52 mL/min — ABNORMAL LOW (ref >90)
Glucose, Bld: 98 mg/dL (ref 70–99)

## 2012-08-12 LAB — CBC
MCH: 26.3 pg (ref 26.0–34.0)
MCHC: 31.6 g/dL (ref 30.0–36.0)
MCV: 83.3 fL (ref 78.0–100.0)
Platelets: 383 10*3/uL (ref 150–400)
RBC: 2.7 MIL/uL — ABNORMAL LOW (ref 3.87–5.11)

## 2012-08-12 SURGERY — EGD (ESOPHAGOGASTRODUODENOSCOPY)
Anesthesia: Moderate Sedation | Laterality: Left

## 2012-08-12 MED ORDER — PANTOPRAZOLE SODIUM 40 MG PO TBEC: 40.0000 mg | DELAYED_RELEASE_TABLET | Freq: Two times a day (BID) | ORAL | Status: DC

## 2012-08-12 MED ORDER — POLYSACCHARIDE IRON COMPLEX 150 MG PO CAPS: 150.0000 mg | ORAL_CAPSULE | Freq: Two times a day (BID) | ORAL | Status: DC

## 2012-08-12 MED ORDER — BUTAMBEN-TETRACAINE-BENZOCAINE 2-2-14 % EX AERO
INHALATION_SPRAY | CUTANEOUS | Status: DC | PRN
  Administered 2012-08-12: 2 via TOPICAL

## 2012-08-12 MED ORDER — MIDAZOLAM HCL 10 MG/2ML IJ SOLN
INTRAMUSCULAR | Status: AC
  Filled 2012-08-12: qty 2

## 2012-08-12 MED ORDER — FERROUS SULFATE 325 (65 FE) MG PO TABS
325.0000 mg | ORAL_TABLET | Freq: Two times a day (BID) | ORAL | Status: DC
  Filled 2012-08-12 (×3): qty 1

## 2012-08-12 MED ORDER — DIPHENHYDRAMINE HCL 12.5 MG/5ML PO ELIX
12.5000 mg | ORAL_SOLUTION | ORAL | Status: AC | PRN
  Administered 2012-08-12 (×2): 12.5 mg via ORAL
  Filled 2012-08-12 (×2): qty 5

## 2012-08-12 MED ORDER — PANTOPRAZOLE SODIUM 40 MG PO TBEC
40.0000 mg | DELAYED_RELEASE_TABLET | Freq: Two times a day (BID) | ORAL | Status: DC
  Filled 2012-08-12 (×3): qty 1

## 2012-08-12 MED ORDER — FENTANYL CITRATE 0.05 MG/ML IJ SOLN
INTRAMUSCULAR | Status: DC | PRN
  Administered 2012-08-12 (×2): 25 ug via INTRAVENOUS
  Administered 2012-08-12: 15 ug via INTRAVENOUS
  Administered 2012-08-12: 10 ug via INTRAVENOUS

## 2012-08-12 MED ORDER — MIDAZOLAM HCL 10 MG/2ML IJ SOLN
INTRAMUSCULAR | Status: DC | PRN
  Administered 2012-08-12 (×3): 2 mg via INTRAVENOUS
  Administered 2012-08-12: 1 mg via INTRAVENOUS

## 2012-08-12 MED ORDER — FUROSEMIDE 10 MG/ML IJ SOLN
20.0000 mg | INTRAMUSCULAR | Status: DC
  Filled 2012-08-12 (×2): qty 2

## 2012-08-12 MED ORDER — FENTANYL CITRATE 0.05 MG/ML IJ SOLN
INTRAMUSCULAR | Status: AC
  Filled 2012-08-12: qty 2

## 2012-08-12 NOTE — Op Note (Signed)
[facility] [address] [place], [address]   ENDOSCOPY PROCEDURE REPORT  PATIENT: Jamie, [name]  MR#: [id number] BIRTHDATE: Jun 27, 1930 , 82  yrs. old GENDER: Female ENDOSCOPIST: Willis Modena, MD REFERRED BY:  Bernette Redbird, M.D. PROCEDURE DATE:  08/12/2012 PROCEDURE:  EGD, diagnostic ASA CLASS:     Class II INDICATIONS:  anemia, history of gastric ulcer. MEDICATIONS: Fentanyl 75 mcg IV and Versed 7 mg IV TOPICAL ANESTHETIC: Cetacaine Spray  DESCRIPTION OF PROCEDURE: After the risks benefits and alternatives of the procedure were thoroughly explained, informed consent was obtained.  The Pentax Gastroscope [id number] endoscope was introduced through the mouth and advanced to the second portion of the duodenum. Without limitations.  The instrument was slowly withdrawn as the mucosa was fully examined.    Findings:  Large hiatal hernia with friable Cameron's erosions; no overt bleeding site and no active bleeding, adherent clot, or visible vessel seen.  Several linear pre-pyloric gastric erosions in background of gastritis seen; appearance not typical of GAVE. Bulbar duodenitis with erosions.  Couple small duodenal AVMs. The scope was then withdrawn from the patient and the procedure completed.  ENDOSCOPIC IMPRESSION:     As above.  Multiple findings to potentially explain anemia, but suspect the hiatal hernia and Cameron's erosions are playing the largest role, which in turn could be exacerbated by NSAIDs.  RECOMMENDATIONS:     1.  Watch for potential complications of procedure. 2.  Avoid NSAIDs as clinically feasible 3.  Resume oral iron (Nu-Iron 150 mg daily) and continue this indefinitely. 4.  Protonix 40 mg bid x 6 weeks. 5.  Restart diet. 6.  Will discuss disposition with primary team.  eSigned:  Willis Modena, MD 08/12/2012 9:09 AM   CC:

## 2012-08-12 NOTE — ED Provider Notes (Signed)
Medical screening examination/treatment/procedure(s) were conducted as a shared visit with non-physician practitioner(s) and myself.  I personally evaluated the patient during the encounter [name], M.D.  Jamie [name] is a 78 y.o. female w/ PMH significant for prior GIB and anemia (not taking iron currently) presenting with worsening fatigue.  Symptoms are severe, worsening.  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  No focal neurologic deficits, no abdominal pain, N/V. Or diarrhea.  VITAL SIGNS:   Filed Vitals:   08/12/12 1400  BP: 125/54  Pulse: 73  Temp: 97.4 F (36.3 C)  Resp: 20   CONSTITUTIONAL: Awake, oriented, appears non-toxic HENT: Atraumatic, normocephalic, oral mucosa pink and moist, airway patent. Nares patent without drainage. External ears normal. EYES: Conjunctiva clear - palpebral conjunctiva pale , EOMI, PERRLA NECK: Trachea midline, non-tender, supple CARDIOVASCULAR: Normal heart rate, Normal rhythm  PULMONARY/CHEST: Clear to auscultation, no rhonchi, wheezes, or rales. Symmetrical breath sounds. Non-tender. ABDOMINAL: Non-distended, soft, non-tender - no rebound or guarding.  BS normal. NEUROLOGIC: Non-focal, moving all four extremities, no gross sensory or motor deficits. EXTREMITIES: No clubbing, cyanosis, or edema SKIN: Warm, Dry, No erythema, No rash   Labs Reviewed  CBC WITH DIFFERENTIAL - Abnormal; Notable for the following:    RBC 2.52 (*)     Hemoglobin 6.4 (*)     HCT 21.1 (*)     MCH 25.4 (*)     Platelets 489 (*)     All other components within normal limits  BASIC METABOLIC PANEL - Abnormal; Notable for the following:    BUN 28 (*)     Creatinine, Ser 1.49 (*)     GFR calc non Af Amer 32 (*)     GFR calc Af Amer 37 (*)     All other components within normal limits  URINALYSIS, ROUTINE W REFLEX MICROSCOPIC - Abnormal; Notable for the  following:    Protein, ur 100 (*)     Leukocytes, UA TRACE (*)     All other components within normal limits  URINE MICROSCOPIC-ADD ON - Abnormal; Notable for the following:    Squamous Epithelial / LPF FEW (*)     Bacteria, UA FEW (*)     All other components within normal limits  BASIC METABOLIC PANEL - Abnormal; Notable for the following:    GFR calc non Af Amer 52 (*)     GFR calc Af Amer 60 (*)     All other components within normal limits  CBC - Abnormal; Notable for the following:    RBC 2.70 (*)     Hemoglobin 7.1 (*)     HCT 22.5 (*)     All other components within normal limits  VITAMIN B12 - Abnormal; Notable for the following:    Vitamin B-12 1538 (*)     All other components within normal limits  PROTIME-INR  TYPE AND SCREEN  OCCULT BLOOD, POC DEVICE  ABO/RH  TROPONIN I  PREPARE RBC (CROSSMATCH)  TSH  PREPARE RBC (CROSSMATCH)  FOLATE RBC  PROTEIN ELECTROPHORESIS, SERUM    MDM: Jamie [name] is a 78 y.o. female with h/o GIB presenting with worsening fatigue, some melena and moderately severe anemia w/Hb of 6.4. OCB Positive. Restrictive strategy of transfusion employed - she is symptomatic, will transfuse 1 unit. Vitals stable and WNL. Pt admitted to hospitalist w/ GI consult.   Jamie  Oryn Casanova, MD 08/12/12 1459

## 2012-08-12 NOTE — Discharge Summary (Addendum)
Physician Discharge Summary  Jamie [name] GLO:[id number] DOB: [birth year] DOA: 08/11/2012  PCP: Jamie Mew, MD  Admit date: 08/11/2012 Discharge date: 08/12/2012  Recommendations for Outpatient Follow-up:  Dr. Lovell Sheehan (PCP) in 1 to 3 weeks Dr. Matthias Hughs in 1 week  Discharge Diagnoses:  Principal Problem:  *Acute blood loss anemia Active Problems:  VITAMIN B12 DEFICIENCY  HYPERTENSION  Murmur, cardiac  Hyperlipidemia  Chronic pain  GI bleeding   Discharge Condition: stable  Diet recommendation: regular  Filed Weights   08/11/12 1900  Weight: 72.576 kg (160 lb)    History of present illness:  The patient is a 78 yo F with history of hypertension, hyperlipidemia, and a bleeding gastric fundus ulcer 05/2011 followed by Dr. Matthias Hughs who was in her normal state of health until 3-4 weeks over. Over the last few weeks, she has become progressively more fatigued, tired, and sluggish. She has also been colder than usual and has been having tingling in her fingers and toes. She was concerned that her blood pressure medication benazepril/HCTZ was contributing to her symptoms, so she discontinued the medication 3-4 days prior to admission. Additionally, she has had increased flatulence and stools that have been more constipated and dark. She has been taking metamucil and prunes to have BMs. She has noticed pallor. She denies abdominal pain, diarrhea, recent travel or antibiotics. She had routine bloodwork done by her PCP today and she was found to be anemic so she was sent to the emergency department for evaluation.  In the ER, her hgb was 6.6mg /dl with a thrombocytosis and repeat hgb 5 hours later was 6.4mg /dl. Her initial chemistry was normal, however, her repeat demonstrated increased BUN:Cr ratio. Gastroenterology has been consulted. She has been ordered a 1 unit PRBC transfusion by the ER.    Hospital Course: Pt was admitted for following conditions :   Normocytic anemia, occult  stool positive in patient with history of bleeding ulcer. EGD showed cameron lesions. Patient was admitted to telemetry. Received 3 unit PRBC to goal hgb > 7mg /dl. Patient will start Nu-Iron BID and protonix BID. Dr. Dulce Sellar saw the patient and performed EGD. Pt will f/u with Dr. Lovell Sheehan and Dr. Matthias Hughs to make sure anemia is taken care of. Left arm pain was chronic and likely muscle demand in the setting of anemia. We discussed with her avoiding NSAIDs as much as possible. EGD showed Large hiatal hernia with friable Cameron's erosions; no overt bleeding site and no active bleeding, adherent clot, or visible vessel seen. Several linear pre-pyloric gastric erosions in background of gastritis seen; appearance not typical of GAVE. Bulbar duodenitis with erosions. Couple small duodenal AVMs.   HTN: Elevated BP was controlled with home medications  HLD: Defer to PCP, continue home medications  Chronic pain: Stable. Continue tylenol and ultram      Procedures: EGD Consultations:  GI Dr. Dulce Sellar  Discharge Exam: Filed Vitals:   08/12/12 0905 08/12/12 0910 08/12/12 0920 08/12/12 1000  BP: 104/59 104/59 113/62 97/51  Pulse:    72  Temp:    97.1 F (36.2 C)  TempSrc:    Oral  Resp: 13 18 15 16   Height:      Weight:      SpO2: 100% 100% 97% 97%    General: A, Ox 3, NAD Cardiovascular: S1S2 reg, no m/r/g Respiratory: CTAB, no w/r/c  Discharge Instructions  Discharge Orders    Future Appointments: Provider: Department: Dept Phone: Center:   08/30/2012 2:00 PM Stacie Glaze, MD [facility]  at [facility] [phone] [facility]       Medication List     As of 08/12/2012 12:53 PM    TAKE these medications         atenolol 25 MG tablet   Commonly known as: TENORMIN   Take 0.5 tablets (12.5 mg total) by mouth daily.      B-12 DOTS 500 MCG Subl   Generic drug: Cyanocobalamin   Place 1 tablet under the tongue daily.      benazepril-hydrochlorthiazide 5-6.25 MG per tablet    Commonly known as: LOTENSIN HCT   Take 1 tablet by mouth daily.      ezetimibe-simvastatin 10-20 MG per tablet   Commonly known as: VYTORIN   Take 1 tablet by mouth 3 (three) times a week.      iron polysaccharides 150 MG capsule   Commonly known as: NIFEREX   Take 1 capsule (150 mg total) by mouth 2 (two) times daily.      pantoprazole 40 MG tablet   Commonly known as: PROTONIX   Take 1 tablet (40 mg total) by mouth 2 (two) times daily before a meal.      Red Yeast Rice 600 MG Caps   Take 1 capsule (600 mg total) by mouth 2 (two) times daily.      traMADol 50 MG tablet   Commonly known as: ULTRAM   Take 1 tablet (50 mg total) by mouth 2 (two) times daily as needed for pain.      Vitamin D 1000 UNITS capsule   Take 1,000 Units by mouth daily.           Follow-up Information    Follow up with Jamie Mew, MD.   Contact information:   [address] [place] [address] [phone]       Follow up with Jamie Reasons, MD. Schedule an appointment as soon as possible for a visit in 1 week.   Contact information:   [address]                         [place] [address] [phone]           The results of significant diagnostics from this hospitalization (including imaging, microbiology, ancillary and laboratory) are listed below for reference.    Significant Diagnostic Studies: No results found.  Microbiology: No results found for this or any previous visit (from the past 240 hour(s)).   Labs: Basic Metabolic Panel:  Lab 08/12/12 2841 08/11/12 1400 08/11/12 0908  NA 142 138 140  K 3.6 3.8 3.8  CL 107 101 103  CO2 28 28 27   GLUCOSE 98 99 128*  BUN 21 28* 23  CREATININE 1.00 1.49* 1.2  CALCIUM 9.3 9.6 9.1  MG -- -- --  PHOS -- -- --   Liver Function Tests:  Lab 08/11/12 0908  AST 18  ALT 14  ALKPHOS 56  BILITOT 0.6  PROT 6.7  ALBUMIN 3.8   No results found for this basename:  LIPASE:5,AMYLASE:5 in the last 168 hours No results found for this basename: AMMONIA:5 in the last 168 hours CBC:  Lab 08/12/12 0545 08/11/12 1400 08/11/12 0908  WBC 6.1 9.9 8.8  NEUTROABS -- 7.0 6.0  HGB 7.1* 6.4* 6.6 Repeated and verified X2.*  HCT 22.5* 21.1* 21.3 Repeated and verified X2.*  MCV 83.3 83.7 81.2  PLT 383 489* 531.0 Repeated and verified X2.*   Cardiac  Enzymes:  Lab 08/11/12 1339  CKTOTAL --  CKMB --  CKMBINDEX --  TROPONINI <0.30   BNP: BNP (last 3 results) No results found for this basename: PROBNP:3 in the last 8760 hours CBG: No results found for this basename: GLUCAP:5 in the last 168 hours  Time coordinating discharge: 35 minutes  Signed:  Samir Ishaq V.  [facility] 08/12/2012, 12:53 PM

## 2012-08-12 NOTE — Interval H&P Note (Signed)
History and Physical Interval Note:  08/12/2012 8:30 AM  Leonides Sake  has presented today for surgery, with the diagnosis of anemia, history gastric ulcer  The various methods of treatment have been discussed with the patient and family. After consideration of risks, benefits and other options for treatment, the patient has consented to  Procedure(s) (LRB) with comments: ESOPHAGOGASTRODUODENOSCOPY (EGD) (Left) as a surgical intervention .  The patient's history has been reviewed, patient examined, no change in status, stable for surgery.  I have reviewed the patient's chart and labs.  Questions were answered to the patient's satisfaction.     Kaisa Wofford M  Assessment: 1.  Anemia. 2.  Personal history of gastric ulcer. 3.  NSAID use for musculoskeletal pain.  Plan:  1.  Endoscopy. 2.  Risks (bleeding, infection, bowel perforation that could require surgery, sedation-related changes in cardiopulmonary systems), benefits (identification and possible treatment of source of symptoms, exclusion of certain causes of symptoms), and alternatives (watchful waiting, radiographic imaging studies, empiric medical treatment) of upper endoscopy (EGD) were explained to patient in detail and she wishes to proceed.

## 2012-08-12 NOTE — Progress Notes (Signed)
1 unit PRBC transfused as ordered, VS called to MD and 2nd unit PRBC cancelled. Pt DC to home.

## 2012-08-13 ENCOUNTER — Encounter (HOSPITAL_COMMUNITY): Payer: Self-pay | Admitting: Gastroenterology

## 2012-08-13 LAB — FOLATE RBC: RBC Folate: 2063 ng/mL — ABNORMAL HIGH (ref >=366)

## 2012-08-15 LAB — TYPE AND SCREEN
ABO/RH(D): O POS
Antibody Screen: NEGATIVE
Unit division: 0

## 2012-08-16 LAB — PROTEIN ELECTROPHORESIS, SERUM
Albumin ELP: 61.5 % (ref 55.8–66.1)
Beta 2: 3.7 % (ref 3.2–6.5)
Beta Globulin: 9.4 % — ABNORMAL HIGH (ref 4.7–7.2)
Gamma Globulin: 11 % — ABNORMAL LOW (ref 11.1–18.8)

## 2012-08-18 ENCOUNTER — Encounter: Payer: PRIVATE HEALTH INSURANCE | Admitting: Internal Medicine

## 2012-08-26 ENCOUNTER — Encounter: Payer: PRIVATE HEALTH INSURANCE | Admitting: Internal Medicine

## 2012-08-30 ENCOUNTER — Ambulatory Visit (INDEPENDENT_AMBULATORY_CARE_PROVIDER_SITE_OTHER): Payer: PRIVATE HEALTH INSURANCE | Admitting: Internal Medicine

## 2012-08-30 ENCOUNTER — Encounter: Payer: Self-pay | Admitting: Internal Medicine

## 2012-08-30 VITALS — BP 156/80 | HR 76 | Temp 98.6°F | Resp 16 | Ht 63.0 in | Wt 160.0 lb

## 2012-08-30 DIAGNOSIS — D500 Iron deficiency anemia secondary to blood loss (chronic): Secondary | ICD-10-CM

## 2012-08-30 DIAGNOSIS — I10 Essential (primary) hypertension: Secondary | ICD-10-CM

## 2012-08-30 DIAGNOSIS — Z0000 Encounter for general adult medical examination without abnormal findings: Secondary | ICD-10-CM

## 2012-08-30 MED ORDER — BISOPROLOL-HYDROCHLOROTHIAZIDE 5-6.25 MG PO TABS: 1.0000 | ORAL_TABLET | Freq: Every day | ORAL | Status: DC

## 2012-08-30 NOTE — Progress Notes (Signed)
Subjective:    Patient ID: Jamie [name], female    DOB: [birth year], 78 y.o.   MRN: [id number]  HPI  CPX Iron deficiency anemia Dr Marjorie Smolder has follow up He started her on iron She was seen at the hospital for transfusion  Review of Systems  Constitutional: Positive for activity change, appetite change and unexpected weight change. Negative for fatigue.  HENT: Negative for ear pain, congestion, neck pain, postnasal drip and sinus pressure.   Eyes: Negative for redness and visual disturbance.  Respiratory: Negative for cough, shortness of breath and wheezing.   Gastrointestinal: Negative for abdominal pain and abdominal distention.  Genitourinary: Negative for dysuria, frequency and menstrual problem.  Musculoskeletal: Negative for myalgias, joint swelling and arthralgias.  Skin: Positive for pallor. Negative for rash and wound.  Neurological: Negative for dizziness, weakness and headaches.  Hematological: Negative for adenopathy. Does not bruise/bleed easily.  Psychiatric/Behavioral: Negative for sleep disturbance and decreased concentration. The patient is nervous/anxious.    Past Medical History  Diagnosis Date  . Hyperlipidemia   . Hypertension   . Obesity     5'3"  . Gastric ulcer 05/2011  . Chronic pain     History   Social History  . Marital Status: Single    Spouse Name: N/A    Number of Children: N/A  . Years of Education: N/A   Occupational History  . Not on file.   Social History Main Topics  . Smoking status: Former Smoker -- 1.0 packs/day    Types: Cigarettes    Quit date: 09/02/1971  . Smokeless tobacco: Never Used  . Alcohol Use: Yes     Comment: occasionally  . Drug Use: No  . Sexually Active: Not Currently   Other Topics Concern  . Not on file   Social History Narrative   Lives in a townhouse with steps.  Lives alone.  Ambulates without assist device.  Drives, pays own bills, and completes ADLs without assistance.  Neighbor, Kriste Basque, is her  emergency contact:  [phone] code    Past Surgical History  Procedure Date  . Bilateral oophorectomy   . Skin biopsy     nose lesion, noncancerous  . Esophagogastroduodenoscopy 08/12/2012    Procedure: ESOPHAGOGASTRODUODENOSCOPY (EGD);  Surgeon: Willis Modena, MD;  Location: [facility];  Service: Endoscopy;  Laterality: Left;    Family History  Problem Relation Age of Onset  . Hypertension Mother   . Hyperlipidemia Mother   . Heart disease Father     58s  . Cancer Brother     No Known Allergies  Current Outpatient Prescriptions on File Prior to Visit  Medication Sig Dispense Refill  . atenolol (TENORMIN) 25 MG tablet Take 0.5 tablets (12.5 mg total) by mouth daily.  90 tablet  3  . Cholecalciferol (VITAMIN D) 1000 UNITS capsule Take 1,000 Units by mouth daily.        . Cyanocobalamin (B-12 DOTS) 500 MCG SUBL Place 1 tablet under the tongue daily.       Marland Kitchen ezetimibe-simvastatin (VYTORIN) 10-20 MG per tablet Take 1 tablet by mouth 3 (three) times a week.      . iron polysaccharides (NU-IRON) 150 MG capsule Take 1 capsule (150 mg total) by mouth 2 (two) times daily.  60 capsule  3  . pantoprazole (PROTONIX) 40 MG tablet Take 1 tablet (40 mg total) by mouth 2 (two) times daily before a meal.  60 tablet  1  . Red Yeast Rice 600 MG CAPS Take 1 capsule (  600 mg total) by mouth 2 (two) times daily.      . traMADol (ULTRAM) 50 MG tablet Take 1 tablet (50 mg total) by mouth 2 (two) times daily as needed for pain.  50 tablet  3  . bisoprolol-hydrochlorothiazide (ZIAC) 5-6.25 MG per tablet Take 1 tablet by mouth daily.  30 tablet  11    BP 156/80  Pulse 76  Temp 98.6 F (37 C)  Resp 16  Ht 5\' 3"  (1.6 m)  Wt 160 lb (72.576 kg)  BMI 28.34 kg/m2       Objective:   Physical Exam  Nursing note and vitals reviewed. Constitutional: She is oriented to person, place, and time. She appears well-developed and well-nourished. No distress.  HENT:  Head: Normocephalic and atraumatic.    Right Ear: External ear normal.  Left Ear: External ear normal.  Nose: Nose normal.  Mouth/Throat: Oropharynx is clear and moist.  Eyes: Conjunctivae normal and EOM are normal. Pupils are equal, round, and reactive to light.  Neck: Normal range of motion. Neck supple. No JVD present. No tracheal deviation present. No thyromegaly present.  Cardiovascular: Normal rate and regular rhythm.   Murmur heard. Pulmonary/Chest: Effort normal and breath sounds normal. She has no wheezes. She exhibits no tenderness.  Abdominal: Soft. Bowel sounds are normal.  Musculoskeletal: Normal range of motion. She exhibits no edema and no tenderness.  Lymphadenopathy:    She has no cervical adenopathy.  Neurological: She is alert and oriented to person, place, and time. She has normal reflexes. No cranial nerve deficit.  Skin: Skin is warm and dry. She is not diaphoretic. There is pallor.  Psychiatric: She has a normal mood and affect. Her behavior is normal.          Assessment & Plan:   This is a routine physical examination for this healthy  Female. Reviewed all health maintenance protocols including mammography colonoscopy bone density and reviewed appropriate screening labs. Her immunization history was reviewed as well as her current medications and allergies refills of her chronic medications were given and the plan for yearly health maintenance was discussed all orders and referrals were made as appropriate.   Monitor CBC differential and iron level today continue iron until we know that she has a sustainable iron level.  Discussed continuing the Protonix on an empty stomach twice daily until she has an office visit with her grafts are all at his.  Excellent cholesterol results. Stable blood pressure on the ziac

## 2012-08-31 LAB — CBC WITH DIFFERENTIAL/PLATELET
Basophils Relative: 0.4 % (ref 0.0–3.0)
Eosinophils Absolute: 0.1 10*3/uL (ref 0.0–0.7)
Eosinophils Relative: 1.2 % (ref 0.0–5.0)
HCT: 30.8 % — ABNORMAL LOW (ref 36.0–46.0)
Lymphs Abs: 1.8 10*3/uL (ref 0.7–4.0)
MCHC: 31.2 g/dL (ref 30.0–36.0)
MCV: 78.3 fl (ref 78.0–100.0)
Monocytes Absolute: 0.3 10*3/uL (ref 0.1–1.0)
Platelets: 446 10*3/uL — ABNORMAL HIGH (ref 150.0–400.0)
RBC: 3.94 Mil/uL (ref 3.87–5.11)
WBC: 8.3 10*3/uL (ref 4.5–10.5)

## 2012-09-02 ENCOUNTER — Telehealth: Payer: Self-pay | Admitting: *Deleted

## 2012-09-02 NOTE — Telephone Encounter (Signed)
Labs results to pt-still anemic and low iron- per dr Lovell Sheehan continue to take fe bid-pt informed

## 2012-09-14 ENCOUNTER — Other Ambulatory Visit: Payer: PRIVATE HEALTH INSURANCE

## 2012-09-14 DIAGNOSIS — R799 Abnormal finding of blood chemistry, unspecified: Secondary | ICD-10-CM

## 2012-09-16 LAB — IMMUNOFIXATION ELECTROPHORESIS: IgM, Serum: 102 mg/dL (ref 52–322)

## 2012-09-17 ENCOUNTER — Telehealth: Payer: Self-pay | Admitting: Emergency Medicine

## 2012-09-17 NOTE — Telephone Encounter (Signed)
PT RETURNED OUR CALL TO SEE IF SHE WAS INTERESTED IN SETTING UP HER EVALUATION FOR VARICOSE VEINS. PT STATED THAT SHE HAS A LOT OF MEDICAL ISSUES GOING ON RIGHT NOW AND SHE WILL CALL BACK WHEN SHE IS READY.

## 2012-09-17 NOTE — Progress Notes (Signed)
Left message on machine.

## 2012-10-27 ENCOUNTER — Other Ambulatory Visit: Payer: Self-pay | Admitting: Internal Medicine

## 2012-12-13 ENCOUNTER — Other Ambulatory Visit: Payer: Self-pay | Admitting: Internal Medicine

## 2013-01-03 ENCOUNTER — Ambulatory Visit: Payer: Medicare Other | Admitting: Internal Medicine

## 2013-01-05 ENCOUNTER — Encounter: Payer: Self-pay | Admitting: Internal Medicine

## 2013-01-05 ENCOUNTER — Ambulatory Visit (INDEPENDENT_AMBULATORY_CARE_PROVIDER_SITE_OTHER): Payer: Medicare Other | Admitting: Internal Medicine

## 2013-01-05 VITALS — BP 132/80 | HR 72 | Temp 98.2°F | Resp 16 | Ht 63.0 in | Wt 165.0 lb

## 2013-01-05 DIAGNOSIS — E785 Hyperlipidemia, unspecified: Secondary | ICD-10-CM

## 2013-01-05 DIAGNOSIS — I10 Essential (primary) hypertension: Secondary | ICD-10-CM

## 2013-01-05 DIAGNOSIS — J3089 Other allergic rhinitis: Secondary | ICD-10-CM

## 2013-01-05 LAB — HEPATIC FUNCTION PANEL
Albumin: 4.1 g/dL (ref 3.5–5.2)
Alkaline Phosphatase: 86 U/L (ref 39–117)
Total Protein: 7.1 g/dL (ref 6.0–8.3)

## 2013-01-05 LAB — LIPID PANEL
Cholesterol: 301 mg/dL — ABNORMAL HIGH (ref 0–200)
VLDL: 42.2 mg/dL — ABNORMAL HIGH (ref 0.0–40.0)

## 2013-01-05 NOTE — Progress Notes (Signed)
  Subjective:    Patient ID: Jamie [name], female    DOB: 11/16/1929, 79 y.o.   MRN: [id number]  HPI Post GI bleed with Dr Matthias Hughs stable Lipid and HTN monitoring Stable and my stop iron but would not resume aspirin Continue fish oil or omega 3 Monitoring lipids    Review of Systems  Constitutional: Negative for activity change, appetite change and fatigue.  HENT: Positive for rhinorrhea, postnasal drip and sinus pressure. Negative for ear pain, congestion and neck pain.   Eyes: Positive for discharge and redness. Negative for visual disturbance.  Respiratory: Negative for cough, shortness of breath and wheezing.   Gastrointestinal: Negative for abdominal pain and abdominal distention.  Genitourinary: Negative for dysuria, frequency and menstrual problem.  Musculoskeletal: Negative for myalgias, joint swelling and arthralgias.  Skin: Negative for rash and wound.  Neurological: Negative for dizziness, weakness and headaches.  Hematological: Negative for adenopathy. Does not bruise/bleed easily.  Psychiatric/Behavioral: Negative for sleep disturbance and decreased concentration.       Objective:   Physical Exam  Vitals reviewed. Constitutional: She is oriented to person, place, and time. She appears well-developed and well-nourished. No distress.  HENT:  Head: Normocephalic and atraumatic.  Right Ear: External ear normal.  Left Ear: External ear normal.  Nose: Nose normal.  Mouth/Throat: Oropharynx is clear and moist.  Eyes: Conjunctivae and EOM are normal. Pupils are equal, round, and reactive to light.  Neck: Normal range of motion. Neck supple. No JVD present. No tracheal deviation present. No thyromegaly present.  Cardiovascular: Normal rate, regular rhythm and intact distal pulses.   Murmur heard. Pulmonary/Chest: Effort normal and breath sounds normal. She has no wheezes. She exhibits no tenderness.  Abdominal: Soft. Bowel sounds are normal.  Musculoskeletal: Normal range  of motion. She exhibits no edema and no tenderness.  Lymphadenopathy:    She has no cervical adenopathy.  Neurological: She is alert and oriented to person, place, and time. She has normal reflexes. No cranial nerve deficit.  Skin: Skin is warm and dry. She is not diaphoretic.  Psychiatric: She has a normal mood and affect. Her behavior is normal.          Assessment & Plan:  Stable OA pain Lipid monitoring Allergies and red eye Trial of dymista

## 2013-01-05 NOTE — Patient Instructions (Addendum)
The patient is instructed to continue all medications as prescribed. Schedule followup with check out clerk upon leaving the clinic  

## 2013-01-20 ENCOUNTER — Encounter: Payer: Self-pay | Admitting: Internal Medicine

## 2013-02-02 ENCOUNTER — Other Ambulatory Visit: Payer: Self-pay | Admitting: Internal Medicine

## 2013-04-06 ENCOUNTER — Other Ambulatory Visit: Payer: Self-pay | Admitting: *Deleted

## 2013-04-06 MED ORDER — TRAMADOL HCL 50 MG PO TABS: ORAL_TABLET | ORAL | Status: DC

## 2013-04-14 ENCOUNTER — Ambulatory Visit (INDEPENDENT_AMBULATORY_CARE_PROVIDER_SITE_OTHER): Payer: Medicare Other | Admitting: Internal Medicine

## 2013-04-14 ENCOUNTER — Encounter: Payer: Self-pay | Admitting: Internal Medicine

## 2013-04-14 VITALS — BP 150/90 | HR 68 | Temp 99.3°F | Wt 172.0 lb

## 2013-04-14 DIAGNOSIS — H6123 Impacted cerumen, bilateral: Secondary | ICD-10-CM

## 2013-04-14 DIAGNOSIS — H6120 Impacted cerumen, unspecified ear: Secondary | ICD-10-CM

## 2013-04-14 DIAGNOSIS — I10 Essential (primary) hypertension: Secondary | ICD-10-CM

## 2013-04-14 NOTE — Patient Instructions (Addendum)
Call or return to clinic prn if these symptoms worsen or fail to improve as anticipated. Cerumen Impaction A cerumen impaction is when the wax in your ear forms a plug. This plug usually causes reduced hearing. Sometimes it also causes an earache or dizziness. Removing a cerumen impaction can be difficult and painful. The wax sticks to the ear canal. The canal is sensitive and bleeds easily. If you try to remove a heavy wax buildup with a cotton tipped swab, you may push it in further. Irrigation with water, suction, and small ear curettes may be used to clear out the wax. If the impaction is fixed to the skin in the ear canal, ear drops may be needed for a few days to loosen the wax. People who build up a lot of wax frequently can use ear wax removal products available in your local drugstore. SEEK MEDICAL CARE IF:  You develop an earache, increased hearing loss, or marked dizziness. Document Released: 09/25/2004 Document Revised: 11/10/2011 Document Reviewed: 11/15/2009 [facility] Patient Information 2014 [place], [place].

## 2013-04-14 NOTE — Progress Notes (Signed)
Subjective:    Patient ID: Jamie [name], female    DOB: 04-29-1930, 79 y.o.   MRN: [id number]  HPI   79 year old patient who is seen today complaining of allergy symptoms. She describes head congestion and a sense of her ears being clogged. She has been using Zyrtec with little benefit. She has been using Q-tips to assist with cerumen removal with some success  Past Medical History  Diagnosis Date  . Hyperlipidemia   . Hypertension   . Obesity     5'3"  . Gastric ulcer 05/2011  . Chronic pain     History   Social History  . Marital Status: Single    Spouse Name: N/A    Number of Children: N/A  . Years of Education: N/A   Occupational History  . Not on file.   Social History Main Topics  . Smoking status: Former Smoker -- 1.00 packs/day    Types: Cigarettes    Quit date: 09/02/1971  . Smokeless tobacco: Never Used  . Alcohol Use: Yes     Comment: occasionally  . Drug Use: No  . Sexual Activity: Not Currently   Other Topics Concern  . Not on file   Social History Narrative   Lives in a townhouse with steps.  Lives alone.  Ambulates without assist device.  Drives, pays own bills, and completes ADLs without assistance.        Neighbor, Kriste Basque, is her emergency contact:  [phone]   Full code                Past Surgical History  Procedure Laterality Date  . Bilateral oophorectomy    . Skin biopsy      nose lesion, noncancerous  . Esophagogastroduodenoscopy  08/12/2012    Procedure: ESOPHAGOGASTRODUODENOSCOPY (EGD);  Surgeon: Willis Modena, MD;  Location: [facility];  Service: Endoscopy;  Laterality: Left;    Family History  Problem Relation Age of Onset  . Hypertension Mother   . Hyperlipidemia Mother   . Heart disease Father     85s  . Cancer Brother     No Known Allergies  Current Outpatient Prescriptions on File Prior to Visit  Medication Sig Dispense Refill  . bisoprolol-hydrochlorothiazide (ZIAC) 5-6.25 MG per tablet Take 1 tablet by mouth  daily.  30 tablet  11  . Cholecalciferol (VITAMIN D) 1000 UNITS capsule Take 1,000 Units by mouth daily.        . Cyanocobalamin (B-12 DOTS) 500 MCG SUBL Place 1 tablet under the tongue daily.       Marland Kitchen ezetimibe-simvastatin (VYTORIN) 10-20 MG per tablet Take 1 tablet by mouth 3 (three) times a week.      . iron polysaccharides (NU-IRON) 150 MG capsule Take 1 capsule (150 mg total) by mouth 2 (two) times daily.  60 capsule  3  . Red Yeast Rice 600 MG CAPS Take 1 capsule (600 mg total) by mouth 2 (two) times daily.      . traMADol (ULTRAM) 50 MG tablet TAKE ONE TABLET BY MOUTH TWICE DAILY AS NEEDED FOR PAIN  50 tablet  1  . atenolol (TENORMIN) 25 MG tablet Take 0.5 tablets (12.5 mg total) by mouth daily.  90 tablet  3   No current facility-administered medications on file prior to visit.    BP 150/90  Pulse 68  Temp(Src) 99.3 F (37.4 C) (Oral)  Wt 172 lb (78.019 kg)  BMI 30.48 kg/m2       Review of Systems  HENT:  Positive for hearing loss, rhinorrhea and sinus pressure.        Objective:   Physical Exam  Constitutional: She is oriented to person, place, and time. She appears well-developed and well-nourished.  HENT:  Head: Normocephalic.  Right Ear: External ear normal.  Left Ear: External ear normal.  Mouth/Throat: Oropharynx is clear and moist.  Bilateral cerumen impaction  Eyes: Conjunctivae and EOM are normal. Pupils are equal, round, and reactive to light.  Neck: Normal range of motion. Neck supple. No thyromegaly present.  Cardiovascular: Normal rate, regular rhythm, normal heart sounds and intact distal pulses.   Pulmonary/Chest: Effort normal and breath sounds normal.  Abdominal: Soft. Bowel sounds are normal. She exhibits no mass. There is no tenderness.  Musculoskeletal: Normal range of motion.  Lymphadenopathy:    She has no cervical adenopathy.  Neurological: She is alert and oriented to person, place, and time.  Skin: Skin is warm and dry. No rash noted.   Psychiatric: She has a normal mood and affect. Her behavior is normal.          Assessment & Plan:   Allergic rhinitis Cerumen impactions  Both canals irrigated until clear. We'll consider a nasal steroid if the symptoms do not improve following removal of the cerumen impaction

## 2013-05-10 ENCOUNTER — Ambulatory Visit: Payer: Medicare Other | Admitting: Internal Medicine

## 2013-05-10 ENCOUNTER — Ambulatory Visit (INDEPENDENT_AMBULATORY_CARE_PROVIDER_SITE_OTHER): Payer: Medicare Other | Admitting: Internal Medicine

## 2013-05-10 ENCOUNTER — Encounter: Payer: Self-pay | Admitting: Internal Medicine

## 2013-05-10 VITALS — BP 155/80 | HR 76 | Temp 98.6°F | Resp 16 | Ht 63.0 in | Wt 172.0 lb

## 2013-05-10 DIAGNOSIS — D62 Acute posthemorrhagic anemia: Secondary | ICD-10-CM

## 2013-05-10 DIAGNOSIS — Z23 Encounter for immunization: Secondary | ICD-10-CM

## 2013-05-10 DIAGNOSIS — I10 Essential (primary) hypertension: Secondary | ICD-10-CM

## 2013-05-10 LAB — CBC WITH DIFFERENTIAL/PLATELET
Eosinophils Absolute: 0.2 10*3/uL (ref 0.0–0.7)
Eosinophils Relative: 1.7 % (ref 0.0–5.0)
MCHC: 33.2 g/dL (ref 30.0–36.0)
MCV: 83.7 fl (ref 78.0–100.0)
Monocytes Absolute: 0.7 10*3/uL (ref 0.1–1.0)
Neutrophils Relative %: 66.9 % (ref 43.0–77.0)
Platelets: 314 10*3/uL (ref 150.0–400.0)
WBC: 9.4 10*3/uL (ref 4.5–10.5)

## 2013-05-10 NOTE — Progress Notes (Signed)
Subjective:    Patient ID: Jamie [name], female    DOB: 1930/02/26, 79 y.o.   MRN: [id number]  HPI Follow up for GI bleed HTN Lipid monitoriing   Review of Systems  Constitutional: Negative for activity change, appetite change and fatigue.  HENT: Negative for ear pain, congestion, neck pain, postnasal drip and sinus pressure.   Eyes: Negative for redness and visual disturbance.  Respiratory: Negative for cough, shortness of breath and wheezing.   Cardiovascular: Positive for palpitations and leg swelling.  Gastrointestinal: Negative for abdominal pain and abdominal distention.  Genitourinary: Negative for dysuria, frequency and menstrual problem.  Musculoskeletal: Negative for myalgias, joint swelling and arthralgias.  Skin: Negative for rash and wound.  Neurological: Negative for dizziness, weakness and headaches.  Hematological: Negative for adenopathy. Does not bruise/bleed easily.  Psychiatric/Behavioral: Negative for sleep disturbance and decreased concentration.   Past Medical History  Diagnosis Date  . Hyperlipidemia   . Hypertension   . Obesity     5'3"  . Gastric ulcer 05/2011  . Chronic pain     History   Social History  . Marital Status: Single    Spouse Name: N/A    Number of Children: N/A  . Years of Education: N/A   Occupational History  . Not on file.   Social History Main Topics  . Smoking status: Former Smoker -- 1.00 packs/day    Types: Cigarettes    Quit date: 09/02/1971  . Smokeless tobacco: Never Used  . Alcohol Use: Yes     Comment: occasionally  . Drug Use: No  . Sexual Activity: Not Currently   Other Topics Concern  . Not on file   Social History Narrative   Lives in a townhouse with steps.  Lives alone.  Ambulates without assist device.  Drives, pays own bills, and completes ADLs without assistance.        Neighbor, Kriste Basque, is her emergency contact:  [phone]   Full code                Past Surgical History  Procedure  Laterality Date  . Bilateral oophorectomy    . Skin biopsy      nose lesion, noncancerous  . Esophagogastroduodenoscopy  08/12/2012    Procedure: ESOPHAGOGASTRODUODENOSCOPY (EGD);  Surgeon: Willis Modena, MD;  Location: [facility];  Service: Endoscopy;  Laterality: Left;    Family History  Problem Relation Age of Onset  . Hypertension Mother   . Hyperlipidemia Mother   . Heart disease Father     7s  . Cancer Brother     No Known Allergies  Current Outpatient Prescriptions on File Prior to Visit  Medication Sig Dispense Refill  . bisoprolol-hydrochlorothiazide (ZIAC) 5-6.25 MG per tablet Take 1 tablet by mouth daily.  30 tablet  11  . Cholecalciferol (VITAMIN D) 1000 UNITS capsule Take 1,000 Units by mouth daily.        Marland Kitchen ezetimibe-simvastatin (VYTORIN) 10-20 MG per tablet Take 1 tablet by mouth 3 (three) times a week.      . iron polysaccharides (NU-IRON) 150 MG capsule Take 1 capsule (150 mg total) by mouth 2 (two) times daily.  60 capsule  3  . Red Yeast Rice 600 MG CAPS Take 1 capsule (600 mg total) by mouth 2 (two) times daily.      . traMADol (ULTRAM) 50 MG tablet TAKE ONE TABLET BY MOUTH TWICE DAILY AS NEEDED FOR PAIN  50 tablet  1  . Cyanocobalamin (B-12 DOTS) 500 MCG  SUBL Place 1 tablet under the tongue daily.        No current facility-administered medications on file prior to visit.    BP 155/80  Pulse 76  Temp(Src) 98.6 F (37 C)  Resp 16  Ht 5\' 3"  (1.6 m)  Wt 172 lb (78.019 kg)  BMI 30.48 kg/m2       Objective:   Physical Exam  Constitutional: She is oriented to person, place, and time. She appears well-developed and well-nourished.  HENT:  Head: Normocephalic and atraumatic.  Eyes: Conjunctivae are normal. Pupils are equal, round, and reactive to light.  Neck: Neck supple.  Cardiovascular: Normal rate and regular rhythm.   Murmur heard. Musculoskeletal: Normal range of motion.  Neurological: She is alert and oriented to person, place, and time.   Skin: Skin is warm and dry.          Assessment & Plan:  Remain off the ASA HTN increased but due to missing dose today Lipid  Iron monitoring to see if she can stop Stable AS murmur Stable CBC?

## 2013-05-10 NOTE — Patient Instructions (Signed)
The patient is instructed to continue all medications as prescribed. Schedule followup with check out clerk upon leaving the clinic  

## 2013-05-11 ENCOUNTER — Ambulatory Visit: Payer: Medicare Other | Admitting: Internal Medicine

## 2013-06-28 ENCOUNTER — Other Ambulatory Visit: Payer: Self-pay | Admitting: Internal Medicine

## 2013-09-14 ENCOUNTER — Ambulatory Visit (INDEPENDENT_AMBULATORY_CARE_PROVIDER_SITE_OTHER): Payer: Medicare Other | Admitting: Internal Medicine

## 2013-09-14 ENCOUNTER — Encounter: Payer: Self-pay | Admitting: Internal Medicine

## 2013-09-14 VITALS — BP 144/88 | HR 80 | Temp 98.0°F | Resp 18 | Ht 63.0 in | Wt 174.0 lb

## 2013-09-14 DIAGNOSIS — Z23 Encounter for immunization: Secondary | ICD-10-CM

## 2013-09-14 DIAGNOSIS — E785 Hyperlipidemia, unspecified: Secondary | ICD-10-CM

## 2013-09-14 DIAGNOSIS — Z0000 Encounter for general adult medical examination without abnormal findings: Secondary | ICD-10-CM

## 2013-09-14 LAB — CBC WITH DIFFERENTIAL/PLATELET
BASOS PCT: 0.4 % (ref 0.0–3.0)
Basophils Absolute: 0 10*3/uL (ref 0.0–0.1)
EOS PCT: 0.7 % (ref 0.0–5.0)
Eosinophils Absolute: 0.1 10*3/uL (ref 0.0–0.7)
HEMATOCRIT: 44 % (ref 36.0–46.0)
Hemoglobin: 15.2 g/dL — ABNORMAL HIGH (ref 12.0–15.0)
LYMPHS ABS: 2.2 10*3/uL (ref 0.7–4.0)
Lymphocytes Relative: 26.9 % (ref 12.0–46.0)
MCHC: 34.6 g/dL (ref 30.0–36.0)
MCV: 86.4 fl (ref 78.0–100.0)
MONO ABS: 0.6 10*3/uL (ref 0.1–1.0)
Monocytes Relative: 7 % (ref 3.0–12.0)
NEUTROS PCT: 65 % (ref 43.0–77.0)
Neutro Abs: 5.4 10*3/uL (ref 1.4–7.7)
Platelets: 269 10*3/uL (ref 150.0–400.0)
RBC: 5.09 Mil/uL (ref 3.87–5.11)
RDW: 14.5 % (ref 11.5–14.6)
WBC: 8.3 10*3/uL (ref 4.5–10.5)

## 2013-09-14 LAB — POCT URINALYSIS DIPSTICK
BILIRUBIN UA: NEGATIVE
GLUCOSE UA: NEGATIVE
Ketones, UA: NEGATIVE
LEUKOCYTES UA: NEGATIVE
NITRITE UA: NEGATIVE
Spec Grav, UA: 1.02
Urobilinogen, UA: 0.2
pH, UA: 7

## 2013-09-14 LAB — LIPID PANEL
Cholesterol: 288 mg/dL — ABNORMAL HIGH (ref 0–200)
HDL: 51.2 mg/dL (ref >39.00)
Total CHOL/HDL Ratio: 6
Triglycerides: 187 mg/dL — ABNORMAL HIGH (ref 0.0–149.0)
VLDL: 37.4 mg/dL (ref 0.0–40.0)

## 2013-09-14 LAB — BASIC METABOLIC PANEL
BUN: 19 mg/dL (ref 6–23)
CO2: 30 meq/L (ref 19–32)
Calcium: 10.6 mg/dL — ABNORMAL HIGH (ref 8.4–10.5)
Chloride: 103 mEq/L (ref 96–112)
Creatinine, Ser: 1 mg/dL (ref 0.4–1.2)
GFR: 59.15 mL/min — ABNORMAL LOW (ref >60.00)
Glucose, Bld: 89 mg/dL (ref 70–99)
POTASSIUM: 3.9 meq/L (ref 3.5–5.1)
SODIUM: 140 meq/L (ref 135–145)

## 2013-09-14 LAB — HEPATIC FUNCTION PANEL
ALK PHOS: 85 U/L (ref 39–117)
ALT: 22 U/L (ref 0–35)
AST: 21 U/L (ref 0–37)
Albumin: 4 g/dL (ref 3.5–5.2)
BILIRUBIN DIRECT: 0.1 mg/dL (ref 0.0–0.3)
TOTAL PROTEIN: 7.1 g/dL (ref 6.0–8.3)
Total Bilirubin: 0.8 mg/dL (ref 0.3–1.2)

## 2013-09-14 LAB — LDL CHOLESTEROL, DIRECT: LDL DIRECT: 208 mg/dL

## 2013-09-14 LAB — TSH: TSH: 1.08 u[IU]/mL (ref 0.35–5.50)

## 2013-09-14 NOTE — Addendum Note (Signed)
Addended by: Allyne Gee on: 09/14/2013 04:19 PM   Modules accepted: Orders

## 2013-09-14 NOTE — Progress Notes (Signed)
Pre visit review using our clinic review tool, if applicable. No additional management support is needed unless otherwise documented below in the visit note. 

## 2013-09-14 NOTE — Progress Notes (Signed)
Subjective:    Patient ID: Jamie [name], female    DOB: 1929/11/30, 80 y.o.   MRN: [id number]  HPI  This is an 80 year old female who is followed for multiple medical problems and presents for her yearly examination she has [place] health care Medicare complete.  She has a history of anxiety, a history of overactive bladder with bladder spasms, history of morbid obesity with secondary issues and cervical strain and chronic back pain.  She has a distant history of a gastric ulcer with gastroesophageal reflux and she has a history of hypertension.  She is also treated for hyperlipidemia  He has historically had whitecoat syndrome of elevated blood pressure upon initial taking of her blood pressure and after she has the opportunity to calm      Review of Systems  Constitutional: Positive for fatigue. Negative for activity change and appetite change.  HENT: Positive for postnasal drip. Negative for congestion, ear pain and sinus pressure.   Eyes: Negative for redness and visual disturbance.  Respiratory: Positive for cough. Negative for shortness of breath and wheezing.   Gastrointestinal: Negative for abdominal pain and abdominal distention.  Endocrine: Positive for cold intolerance.  Genitourinary: Negative for dysuria, frequency and menstrual problem.  Musculoskeletal: Negative for arthralgias, joint swelling, myalgias and neck pain.  Skin: Negative for rash and wound.  Neurological: Negative for dizziness, weakness and headaches.  Hematological: Negative for adenopathy. Does not bruise/bleed easily.  Psychiatric/Behavioral: Negative for sleep disturbance and decreased concentration.   Past Medical History  Diagnosis Date  . Hyperlipidemia   . Hypertension   . Obesity     5'3"  . Gastric ulcer 05/2011  . Chronic pain     History   Social History  . Marital Status: Single    Spouse Name: N/A    Number of Children: N/A  . Years of Education: N/A   Occupational History    . Not on file.   Social History Main Topics  . Smoking status: Former Smoker -- 1.00 packs/day    Types: Cigarettes    Quit date: 09/02/1971  . Smokeless tobacco: Never Used  . Alcohol Use: Yes     Comment: occasionally  . Drug Use: No  . Sexual Activity: Not Currently   Other Topics Concern  . Not on file   Social History Narrative   Lives in a townhouse with steps.  Lives alone.  Ambulates without assist device.  Drives, pays own bills, and completes ADLs without assistance.        Neighbor, [name], is her emergency contact:  [phone]   Full code                Past Surgical History  Procedure Laterality Date  . Bilateral oophorectomy    . Skin biopsy      nose lesion, noncancerous  . Esophagogastroduodenoscopy  08/12/2012    Procedure: ESOPHAGOGASTRODUODENOSCOPY (EGD);  Surgeon: Arta Silence, MD;  Location: [facility];  Service: Endoscopy;  Laterality: Left;    Family History  Problem Relation Age of Onset  . Hypertension Mother   . Hyperlipidemia Mother   . Heart disease Father     59s  . Cancer Brother     No Known Allergies  Current Outpatient Prescriptions on File Prior to Visit  Medication Sig Dispense Refill  . atenolol (TENORMIN) 25 MG tablet TAKE ONE-HALF TABLET BY MOUTH EVERY DAY  90 tablet  3  . bisoprolol-hydrochlorothiazide (ZIAC) 5-6.25 MG per tablet Take 1 tablet by  mouth daily.  30 tablet  11  . Cholecalciferol (VITAMIN D) 1000 UNITS capsule Take 1,000 Units by mouth daily.        . Cyanocobalamin (B-12 DOTS) 500 MCG SUBL Place 1 tablet under the tongue daily.       . iron polysaccharides (NU-IRON) 150 MG capsule Take 1 capsule (150 mg total) by mouth 2 (two) times daily.  60 capsule  3  . Red Yeast Rice 600 MG CAPS Take 1 capsule (600 mg total) by mouth 2 (two) times daily.      Marland Kitchen ezetimibe-simvastatin (VYTORIN) 10-20 MG per tablet Take 1 tablet by mouth 3 (three) times a week.       No current facility-administered medications on file  prior to visit.    BP 174/90  Pulse 80  Temp(Src) 98 F (36.7 C)  Resp 18  Ht 5\' 3"  (1.6 m)  Wt 174 lb (78.926 kg)  BMI 30.83 kg/m2       Objective:   Physical Exam  Constitutional: She is oriented to person, place, and time. She appears well-developed and well-nourished. No distress.  HENT:  Head: Normocephalic and atraumatic.  Eyes: Conjunctivae and EOM are normal. Pupils are equal, round, and reactive to light.  Neck: Normal range of motion. Neck supple. No JVD present. No tracheal deviation present. No thyromegaly present.  Cardiovascular:  Murmur heard. Pulmonary/Chest: Effort normal and breath sounds normal. She has no wheezes. She exhibits no tenderness.  Abdominal: Soft. Bowel sounds are normal.  Genitourinary: No breast swelling, tenderness, discharge or bleeding. Pelvic exam was performed with patient supine.  Musculoskeletal: Normal range of motion. She exhibits no edema and no tenderness.  Lymphadenopathy:    She has no cervical adenopathy.  Neurological: She is alert and oriented to person, place, and time. She has normal reflexes. No cranial nerve deficit.  Skin: Skin is warm and dry. She is not diaphoretic.  Psychiatric: She has a normal mood and affect. Her behavior is normal.          Assessment & Plan:   This is a routine physical examination for this healthy  Female. Reviewed all health maintenance protocols including mammography colonoscopy bone density and reviewed appropriate screening labs. Her immunization history was reviewed as well as her current medications and allergies refills of her chronic medications were given and the plan for yearly health maintenance was discussed all orders and referrals were made as appropriate. prevnar today  A recheck of blood pressure was 144/88 Lab monitoring ordered

## 2013-09-21 ENCOUNTER — Other Ambulatory Visit: Payer: Self-pay | Admitting: Internal Medicine

## 2013-09-26 ENCOUNTER — Telehealth: Payer: Self-pay | Admitting: Internal Medicine

## 2013-09-26 NOTE — Telephone Encounter (Signed)
Pt requesting lab results from 09/14/13.

## 2013-09-26 NOTE — Telephone Encounter (Signed)
Called and talked wth pt

## 2013-10-24 ENCOUNTER — Other Ambulatory Visit: Payer: Self-pay | Admitting: Internal Medicine

## 2013-11-22 ENCOUNTER — Telehealth: Payer: Self-pay | Admitting: Internal Medicine

## 2013-11-22 NOTE — Telephone Encounter (Signed)
Patient Information:  Caller Name: Guerry Minors  Phone: [phone]  Patient: Jamie [name]  Gender: Female  DOB: 1929/10/06  Age: 80 Years  PCP: Benay Pillow (Adults only)  Office Follow Up:  Does the office need to follow up with this patient?: Yes  Instructions For The Office: INfo to office krs/can  RN Note:  Neighbor/Loretta calling about patient.  States patient is 80 and becoming more confused over the past few days, with incessant telephoning, doorbell ringing, etc.  States the patient has called the police; states first called police [id number] 45/80/99 because she believed someone had stolen her purse.  States purse was later found in a [employer].  States the second time the police were at the home 0730 78/38/25, "and the police were there for two hours," but does not know the reason why.  She has no relation to the patient.  Per Epic, the patient has no relatives, no one with POA, etc.  There are two brothers in [place] per neighbor, but no family locally.  Caller states she wants the office staff to know, "in case there's some reason you can call her in for an appointment and make sure she gets what she needs."  States is happy to speak with someone in office if necessary.  Advised police may have initiated follow up care for patient; info to office for staff/provider review.  krs/can  Symptoms  Reason For Call & Symptoms: worsening confusion  Reviewed Health History In EMR: N/A  Reviewed Medications In EMR: N/A  Reviewed Allergies In EMR: N/A  Reviewed Surgeries / Procedures: N/A  Date of Onset of Symptoms: 11/20/2013  Guideline(s) Used:  No Protocol Available - Information Only  Disposition Per Guideline:   Discuss with PCP and Callback by Nurse Today  Reason For Disposition Reached:   Nursing judgment  Advice Given:  N/A  Patient Will Follow Care Advice:  YES

## 2013-11-24 ENCOUNTER — Encounter: Payer: Self-pay | Admitting: Family Medicine

## 2013-11-24 ENCOUNTER — Ambulatory Visit (INDEPENDENT_AMBULATORY_CARE_PROVIDER_SITE_OTHER): Payer: Medicare Other | Admitting: Family Medicine

## 2013-11-24 VITALS — BP 140/90 | HR 101 | Wt 176.0 lb

## 2013-11-24 DIAGNOSIS — F22 Delusional disorders: Secondary | ICD-10-CM

## 2013-11-24 DIAGNOSIS — R410 Disorientation, unspecified: Secondary | ICD-10-CM

## 2013-11-24 DIAGNOSIS — E538 Deficiency of other specified B group vitamins: Secondary | ICD-10-CM

## 2013-11-24 DIAGNOSIS — R319 Hematuria, unspecified: Secondary | ICD-10-CM

## 2013-11-24 DIAGNOSIS — F05 Delirium due to known physiological condition: Secondary | ICD-10-CM

## 2013-11-24 LAB — POCT URINALYSIS DIPSTICK
Glucose, UA: NEGATIVE
KETONES UA: NEGATIVE
Nitrite, UA: NEGATIVE
Spec Grav, UA: 1.025
Urobilinogen, UA: 1
pH, UA: 7

## 2013-11-24 LAB — VITAMIN B12: Vitamin B-12: 1192 pg/mL — ABNORMAL HIGH (ref 211–911)

## 2013-11-24 NOTE — Telephone Encounter (Signed)
Per Dr Arnoldo Morale pt has done this before.  He would like pt to be seen to r/o UTI.  If urine is clear than he wants her referred to psychology for depression.  Pt has a hx of depression.  Left message for pt to call back

## 2013-11-24 NOTE — Progress Notes (Signed)
   Subjective:    Patient ID: Jamie [name], female    DOB: 1930/01/27, 80 y.o.   MRN: [id number]  HPI Patient is seen accompanied by a neighbor with concerns that she's had some progressive confusion over recent months. She has some definite paranoid thinking after speaking with patient. She makes specific comments that someone has been breaking into her house repeatedly though apparently there's been no evidence for this. She also complains that someone has recently been " poisoning her wine"  and smearing things on her pictures that she has hanging in her house. She denies any history of depression. She had recent physical back in January and had normal TSH and no other acute abnormalities.  There was apparently some concern about UTI though again she is not complaining of any acute symptoms and these appear to be more subacute. She is not complaining dysuria, fever, chills, or urinary frequency. She does not have any history of paranoia previously.  Chronic problems include history of reported vitamin D deficiency though she had normal levels back in 2013. She has history of hyperlipidemia, hypertension.  Past Medical History  Diagnosis Date  . Hyperlipidemia   . Hypertension   . Obesity     5'3"  . Gastric ulcer 05/2011  . Chronic pain    Past Surgical History  Procedure Laterality Date  . Bilateral oophorectomy    . Skin biopsy      nose lesion, noncancerous  . Esophagogastroduodenoscopy  08/12/2012    Procedure: ESOPHAGOGASTRODUODENOSCOPY (EGD);  Surgeon: Arta Silence, MD;  Location: [facility];  Service: Endoscopy;  Laterality: Left;    reports that she quit smoking about 42 years ago. Her smoking use included Cigarettes. She smoked 1.00 pack per day. She has never used smokeless tobacco. She reports that she drinks alcohol. She reports that she does not use illicit drugs. family history includes Cancer in her brother; Heart disease in her father; Hyperlipidemia in her mother;  Hypertension in her mother. No Known Allergies      Review of Systems  Constitutional: Negative for fever and chills.  Eyes: Negative for visual disturbance.  Respiratory: Negative for cough and shortness of breath.   Cardiovascular: Negative for chest pain.  Gastrointestinal: Negative for nausea, vomiting and abdominal pain.  Endocrine: Negative for polydipsia and polyuria.  Genitourinary: Negative for hematuria.  Neurological: Negative for headaches.  Psychiatric/Behavioral: Positive for confusion. Negative for suicidal ideas and dysphoric mood.       Objective:   Physical Exam  Constitutional: She appears well-developed and well-nourished.  HENT:  Mouth/Throat: Oropharynx is clear and moist.  Neck: Neck supple. No thyromegaly present.  Cardiovascular: Normal rate.   Pulmonary/Chest: Effort normal and breath sounds normal. No respiratory distress. She has no wheezes. She has no rales.  Musculoskeletal: She exhibits no edema.  Neurological: She is alert. No cranial nerve deficit.  Gait is normal. No focal weakness.  Psychiatric:  Patient demonstrate some definite paranoid thinking. She is able to answer questions appropriately.          Assessment & Plan:  Recent progressive confusion and paranoia. ?depression with paronoia, dementia with paranoia, vs other.  Symptoms are subacute or chronic. Check labs with urinalysis and repeat B12. Recent TSH normal. Set up referral with geriatric psychiatrist for further evaluation

## 2013-11-24 NOTE — Progress Notes (Signed)
Pre visit review using our clinic review tool, if applicable. No additional management support is needed unless otherwise documented below in the visit note. 

## 2013-11-25 ENCOUNTER — Ambulatory Visit: Payer: Medicare Other | Admitting: Internal Medicine

## 2013-11-25 NOTE — Telephone Encounter (Signed)
Pt came in to see Dr Elease Hashimoto

## 2013-11-26 ENCOUNTER — Emergency Department (HOSPITAL_COMMUNITY): Payer: Medicare Other

## 2013-11-26 ENCOUNTER — Emergency Department (HOSPITAL_COMMUNITY)
Admission: EM | Admit: 2013-11-26 | Discharge: 2013-11-28 | Disposition: A | Discharge status: Other Acute Inpatient Hospital | Payer: Medicare Other | Attending: Emergency Medicine | Admitting: Emergency Medicine

## 2013-11-26 DIAGNOSIS — Z87891 Personal history of nicotine dependence: Secondary | ICD-10-CM | POA: Insufficient documentation

## 2013-11-26 DIAGNOSIS — R319 Hematuria, unspecified: Secondary | ICD-10-CM | POA: Insufficient documentation

## 2013-11-26 DIAGNOSIS — F22 Delusional disorders: Secondary | ICD-10-CM | POA: Insufficient documentation

## 2013-11-26 DIAGNOSIS — Z79899 Other long term (current) drug therapy: Secondary | ICD-10-CM | POA: Insufficient documentation

## 2013-11-26 DIAGNOSIS — I10 Essential (primary) hypertension: Secondary | ICD-10-CM | POA: Insufficient documentation

## 2013-11-26 DIAGNOSIS — Z8719 Personal history of other diseases of the digestive system: Secondary | ICD-10-CM | POA: Insufficient documentation

## 2013-11-26 DIAGNOSIS — E669 Obesity, unspecified: Secondary | ICD-10-CM | POA: Insufficient documentation

## 2013-11-26 DIAGNOSIS — G8929 Other chronic pain: Secondary | ICD-10-CM | POA: Insufficient documentation

## 2013-11-26 DIAGNOSIS — E785 Hyperlipidemia, unspecified: Secondary | ICD-10-CM | POA: Insufficient documentation

## 2013-11-26 DIAGNOSIS — F29 Unspecified psychosis not due to a substance or known physiological condition: Secondary | ICD-10-CM | POA: Insufficient documentation

## 2013-11-26 LAB — COMPREHENSIVE METABOLIC PANEL
ALT: 14 U/L (ref 0–35)
AST: 16 U/L (ref 0–37)
Albumin: 3.9 g/dL (ref 3.5–5.2)
Alkaline Phosphatase: 84 U/L (ref 39–117)
BILIRUBIN TOTAL: 0.7 mg/dL (ref 0.3–1.2)
BUN: 14 mg/dL (ref 6–23)
CHLORIDE: 105 meq/L (ref 96–112)
CO2: 25 meq/L (ref 19–32)
CREATININE: 0.94 mg/dL (ref 0.50–1.10)
Calcium: 9.7 mg/dL (ref 8.4–10.5)
GFR calc Af Amer: 64 mL/min — ABNORMAL LOW (ref >90)
GFR, EST NON AFRICAN AMERICAN: 55 mL/min — AB (ref >90)
Glucose, Bld: 119 mg/dL — ABNORMAL HIGH (ref 70–99)
Potassium: 4 mEq/L (ref 3.7–5.3)
Sodium: 143 mEq/L (ref 137–147)
Total Protein: 7.2 g/dL (ref 6.0–8.3)

## 2013-11-26 LAB — CBC WITH DIFFERENTIAL/PLATELET
Basophils Absolute: 0 10*3/uL (ref 0.0–0.1)
Basophils Relative: 0 % (ref 0–1)
Eosinophils Absolute: 0 10*3/uL (ref 0.0–0.7)
Eosinophils Relative: 1 % (ref 0–5)
HEMATOCRIT: 47 % — AB (ref 36.0–46.0)
HEMOGLOBIN: 15.7 g/dL — AB (ref 12.0–15.0)
LYMPHS ABS: 1.4 10*3/uL (ref 0.7–4.0)
LYMPHS PCT: 20 % (ref 12–46)
MCH: 30.5 pg (ref 26.0–34.0)
MCHC: 33.4 g/dL (ref 30.0–36.0)
MCV: 91.4 fL (ref 78.0–100.0)
Monocytes Absolute: 0.6 10*3/uL (ref 0.1–1.0)
Monocytes Relative: 9 % (ref 3–12)
Neutro Abs: 5 10*3/uL (ref 1.7–7.7)
Neutrophils Relative %: 70 % (ref 43–77)
Platelets: 237 10*3/uL (ref 150–400)
RBC: 5.14 MIL/uL — ABNORMAL HIGH (ref 3.87–5.11)
RDW: 14.2 % (ref 11.5–15.5)
WBC: 7.1 10*3/uL (ref 4.0–10.5)

## 2013-11-26 LAB — URINALYSIS, ROUTINE W REFLEX MICROSCOPIC
Bilirubin Urine: NEGATIVE
Glucose, UA: NEGATIVE mg/dL
Ketones, ur: NEGATIVE mg/dL
Leukocytes, UA: NEGATIVE
Nitrite: NEGATIVE
PROTEIN: 30 mg/dL — AB
Specific Gravity, Urine: 1.017 (ref 1.005–1.030)
UROBILINOGEN UA: 0.2 mg/dL (ref 0.0–1.0)
pH: 6.5 (ref 5.0–8.0)

## 2013-11-26 LAB — CBG MONITORING, ED: GLUCOSE-CAPILLARY: 107 mg/dL — AB (ref 70–99)

## 2013-11-26 LAB — URINE MICROSCOPIC-ADD ON

## 2013-11-26 LAB — RAPID URINE DRUG SCREEN, HOSP PERFORMED
AMPHETAMINES: NOT DETECTED
BARBITURATES: NOT DETECTED
Benzodiazepines: NOT DETECTED
Cocaine: NOT DETECTED
Opiates: NOT DETECTED
Tetrahydrocannabinol: NOT DETECTED

## 2013-11-26 LAB — URINE CULTURE
Colony Count: NO GROWTH
Organism ID, Bacteria: NO GROWTH

## 2013-11-26 LAB — ETHANOL: Alcohol, Ethyl (B): <11 mg/dL (ref 0–11)

## 2013-11-26 MED ORDER — HALOPERIDOL LACTATE 5 MG/ML IJ SOLN: 5.0000 mg | Freq: Once | INTRAMUSCULAR | Status: DC

## 2013-11-26 MED ORDER — ATENOLOL 12.5 MG HALF TABLET
12.5000 mg | ORAL_TABLET | Freq: Every day | ORAL | Status: DC
  Administered 2013-11-26 – 2013-11-28 (×3): 12.5 mg via ORAL
  Filled 2013-11-26 (×5): qty 1

## 2013-11-26 MED ORDER — POLYETHYL GLYCOL-PROPYL GLYCOL 0.4-0.3 % OP SOLN: 2.0000 [drp] | OPHTHALMIC | Status: DC | PRN

## 2013-11-26 MED ORDER — NITROFURANTOIN MONOHYD MACRO 100 MG PO CAPS
100.0000 mg | ORAL_CAPSULE | Freq: Two times a day (BID) | ORAL | Status: DC
  Administered 2013-11-27 – 2013-11-28 (×2): 100 mg via ORAL
  Filled 2013-11-26 (×8): qty 1

## 2013-11-26 MED ORDER — BISOPROLOL-HYDROCHLOROTHIAZIDE 5-6.25 MG PO TABS
1.0000 | ORAL_TABLET | Freq: Every day | ORAL | Status: DC
  Administered 2013-11-27 – 2013-11-28 (×2): 1 via ORAL
  Filled 2013-11-26 (×4): qty 1

## 2013-11-26 MED ORDER — LORAZEPAM 1 MG PO TABS
1.0000 mg | ORAL_TABLET | Freq: Once | ORAL | Status: AC
  Administered 2013-11-26: 1 mg via ORAL
  Filled 2013-11-26: qty 1

## 2013-11-26 MED ORDER — LORAZEPAM 1 MG PO TABS
1.0000 mg | ORAL_TABLET | Freq: Three times a day (TID) | ORAL | Status: DC | PRN
  Administered 2013-11-26 – 2013-11-28 (×3): 1 mg via ORAL
  Filled 2013-11-26 (×4): qty 1

## 2013-11-26 MED ORDER — POLYVINYL ALCOHOL 1.4 % OP SOLN
1.0000 [drp] | OPHTHALMIC | Status: DC | PRN
  Filled 2013-11-26: qty 15
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
## 2013-11-26 NOTE — ED Notes (Signed)
Pt friend has arrived and at pt bedside. SW talking to pt friend. Pt is walking in front of nurses station. Pt requesting to speak to doctor. Was explained to pt that doctor will not make rounds today and has already spent significant time with her in making decisions on her care. Pt is aware that she will be going to another facility, possible to [facility]. Pt is A&O and in NAD

## 2013-11-26 NOTE — ED Notes (Signed)
Patient transported to CT 

## 2013-11-26 NOTE — ED Notes (Addendum)
Pt gave [employer] niece number: [phone]

## 2013-11-26 NOTE — Progress Notes (Signed)
[place]: 2015 [facility] not been worked up yet. [place]: No Beds [facility] with [facility] no beds [facility]: Spoke with [facility] no beds [place]: No Beds [facility]: No Beds  Loris Winrow [place], MHT

## 2013-11-26 NOTE — ED Notes (Signed)
Pt called neighbor and requested that everything be explained to her. Pt neighbor is coming to get pt keys to pt condo so that she may bring pt her glasses to that she can see. Pt wants neighbor to enter her home to check everything. Pt requested graham crackers and juice which was provided.

## 2013-11-26 NOTE — ED Notes (Signed)
Pt ambulated to BR with no assistance 

## 2013-11-26 NOTE — Progress Notes (Addendum)
CSW received voicmail message from pt's lifelong friend, Jamie [name], wanting to offer collateral. CSW called back and LM.  Jamie [name],     ED CSW  phone: [phone] 12:51pm ____________  CSW provided phone to pt to call niece.  Jamie [name],     ED CSW  phone: [phone] 12:52pm  __________  Pt signed consent for niece, Jamie [name]. On chart.  CSW provided phone for pt to call Jamie [name].  Jamie [name],     ED CSW  phone: [phone] 1:17pm __________  Consent for Jamie [name] and Jamie [name] on chart.  Jamie [name],     ED CSW  phone: [phone] 1:37pm

## 2013-11-26 NOTE — ED Notes (Signed)
Pt neighbor contact [name]: [phone]

## 2013-11-26 NOTE — ED Notes (Signed)
SW at bedside. Pt given lunch tray

## 2013-11-26 NOTE — BH Assessment (Signed)
Dr. Betsey Holiday EDP requested that patient be IVC'ed, IVC paperwork was completed per doctors orders.
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
## 2013-11-26 NOTE — ED Notes (Signed)
Pt repeatedly coming to nurses station stating she does not want to go to [facility] or other facility for tx. Explained to pt that it is safer for her to have her memory problems checked d/t her circumstances. Charge RN notified to talk to pt.

## 2013-11-26 NOTE — ED Provider Notes (Signed)
CSN: [id number]     Arrival date & time 11/26/13  0865 History   First MD Initiated Contact with Patient 11/26/13 [phone]     Chief Complaint  Patient presents with  . Medical Clearance     (Consider location/radiation/quality/duration/timing/severity/associated sxs/prior Treatment) HPI Comments: 80 year old female presents with police officers for concerns for paranoia and psychiatric evaluation. According to the officers the patient is called police nearly every day for the past week. These are each called about concerns for burglary. The patient states she is not imagining things and noticed things missing or things moved her house. Police state one example was patient was when she called about her keys being stolen and they found him in the front door. The patient admits that she's been having new memory problems over this past week. 2 days ago police contacted Adult Protective Services to do feeling the patient is unsafe to live at home. Patient saw her primary care physician 2 days ago and had some labs and a urine evaluated. Patient denies a fevers or chills. She does recognize that she's having memory problems but does not feel that she is making up that someone is coming into her house.   Past Medical History  Diagnosis Date  . Hyperlipidemia   . Hypertension   . Obesity     5'3"  . Gastric ulcer 05/2011  . Chronic pain    Past Surgical History  Procedure Laterality Date  . Bilateral oophorectomy    . Skin biopsy      nose lesion, noncancerous  . Esophagogastroduodenoscopy  08/12/2012    Procedure: ESOPHAGOGASTRODUODENOSCOPY (EGD);  Surgeon: Arta Silence, MD;  Location: [facility];  Service: Endoscopy;  Laterality: Left;   Family History  Problem Relation Age of Onset  . Hypertension Mother   . Hyperlipidemia Mother   . Heart disease Father     65s  . Cancer Brother    History  Substance Use Topics  . Smoking status: Former Smoker -- 1.00 packs/day    Types:  Cigarettes    Quit date: 09/02/1971  . Smokeless tobacco: Never Used  . Alcohol Use: Yes     Comment: occasionally   OB History   Grav Para Term Preterm Abortions TAB SAB Ect Mult Living                 Review of Systems  Constitutional: Negative for fever.  Respiratory: Negative for shortness of breath.   Cardiovascular: Negative for chest pain.  Genitourinary: Positive for decreased urine volume. Negative for dysuria.  Neurological: Negative for weakness and headaches.  Psychiatric/Behavioral: Positive for confusion.  All other systems reviewed and are negative.      Allergies  Review of patient's allergies indicates no known allergies.  Home Medications   Current Outpatient Rx  Name  Route  Sig  Dispense  Refill  . atenolol (TENORMIN) 25 MG tablet   Oral   Take 12.5 mg by mouth daily.         . bisoprolol-hydrochlorothiazide (ZIAC) 5-6.25 MG per tablet   Oral   Take 1 tablet by mouth daily.         . Cholecalciferol (VITAMIN D) 1000 UNITS capsule   Oral   Take 1,000 Units by mouth daily.           . nitrofurantoin, macrocrystal-monohydrate, (MACROBID) 100 MG capsule   Oral   Take 100 mg by mouth 2 (two) times daily.          Marland Kitchen  Polyethyl Glycol-Propyl Glycol (SYSTANE ULTRA) 0.4-0.3 % SOLN   Ophthalmic   Apply 2 drops to eye as needed (dry eyes).         . vitamin B-12 (CYANOCOBALAMIN) 500 MCG tablet   Oral   Take 500 mcg by mouth daily.          BP 199/114  Pulse 124  Temp(Src) 97.8 F (36.6 C) (Oral)  Resp 22  SpO2 98% Physical Exam  Nursing note and vitals reviewed. Constitutional: She is oriented to person, place, and time. She appears well-developed and well-nourished.  Visibly appears upset  HENT:  Head: Normocephalic and atraumatic.  Right Ear: External ear normal.  Left Ear: External ear normal.  Nose: Nose normal.  Eyes: EOM are normal. Pupils are equal, round, and reactive to light. Right eye exhibits no discharge. Left eye  exhibits no discharge.  Cardiovascular: Normal rate, regular rhythm and normal heart sounds.   Pulmonary/Chest: Effort normal and breath sounds normal.  Abdominal: Soft. She exhibits no distension. There is no tenderness.  Neurological: She is alert and oriented to person, place, and time.  CN 2-12 grossly intact. 5/5 strength in all 4 extremities  Skin: Skin is warm and dry.    ED Course  Procedures (including critical care time) Labs Review Labs Reviewed  CBC WITH DIFFERENTIAL - Abnormal; Notable for the following:    RBC 5.14 (*)    Hemoglobin 15.7 (*)    HCT 47.0 (*)    All other components within normal limits  COMPREHENSIVE METABOLIC PANEL - Abnormal; Notable for the following:    Glucose, Bld 119 (*)    GFR calc non Af Amer 55 (*)    GFR calc Af Amer 64 (*)    All other components within normal limits  URINALYSIS, ROUTINE W REFLEX MICROSCOPIC - Abnormal; Notable for the following:    APPearance CLOUDY (*)    Hgb urine dipstick LARGE (*)    Protein, ur 30 (*)    All other components within normal limits  CBG MONITORING, ED - Abnormal; Notable for the following:    Glucose-Capillary 107 (*)    All other components within normal limits  ETHANOL  URINE RAPID DRUG SCREEN (HOSP PERFORMED)  URINE MICROSCOPIC-ADD ON   Imaging Review Ct Head Wo Contrast  11/26/2013   CLINICAL DATA:  Behavioral changes.  EXAM: CT HEAD WITHOUT CONTRAST  TECHNIQUE: Contiguous axial images were obtained from the base of the skull through the vertex without intravenous contrast.  COMPARISON:  None.  FINDINGS: Ventricles and sulci are appropriate for age. Age appropriate atrophy. Periventricular white matter hypodensities most compatible with chronic small vessel ischemic change. There is no intra or extra-axial fluid collection or mass lesion. The basilar cisterns and ventricles have a normal appearance. There is no CT evidence for acute infarction or hemorrhage. Left vertebral artery atherosclerotic  change. Paranasal sinuses are unremarkable. Mastoid air cells are well aerated. Calvarium is intact.  IMPRESSION: Periventricular white matter hypodensities most compatible with chronic small vessel disease.  No acute intracranial process.   Electronically Signed   By: Lovey Newcomer M.D.   On: 11/26/2013 08:46     EKG Interpretation None      MDM   Final diagnoses:  Paranoia    Patient's only abnormality appears to be paranoia. She has no focal weakness or neuro signs. Social worker talked to the patient's niece and she noted that the patient has had psych issues in the past very similar to this. At  this point the patient is medically cleared. She has hematuria of unknown origin but no urinary complaints. Her tachycardia has resolved. I feel this is mostly related to a anxiety component. Psychiatry has been consulted and that'll help to dispo the patient.    Ephraim Hamburger, MD 11/26/13 1500

## 2013-11-26 NOTE — ED Notes (Signed)
Pt said she is going to take a nap.

## 2013-11-26 NOTE — ED Notes (Signed)
Pt reports that she has hx of HTN, takes BP meds but did not take them PTA today

## 2013-11-26 NOTE — ED Notes (Signed)
Pt is currently sitting with EMT at the nurses station.

## 2013-11-26 NOTE — BH Assessment (Signed)
[facility] Assessment Progress Note   Clinician re-completed IVC paperwork and Examination & Recommendation Form per request of magistrate.    Boyce Medici. MSW, [facility]-Triage Specialist   Phone: [phone] Fax: [phone]

## 2013-11-26 NOTE — ED Notes (Addendum)
Per Dr. Regenia Skeeter, gave pt 1 bisoprolol/HCTZ med from her purse after pt reported that she had not taken PTA.

## 2013-11-26 NOTE — ED Notes (Signed)
Psych MD at bedside

## 2013-11-26 NOTE — ED Notes (Signed)
Pt came to desk requesting to use the phone. Number dialed for pt

## 2013-11-26 NOTE — ED Notes (Addendum)
Pt brought by [employer]. [employer] reports that pt has called them multiple times a day for past few months. [employer] has come to home nearly every day for past few weeks. Yesterday neighbors called [employer] and then help pt go to [place] [facility] for eval. [employer] also reports adult protective services called yesterday for pt. [employer] brought pt in for eval. Pt voluntary and lives by herself.

## 2013-11-26 NOTE — ED Notes (Signed)
Pt. Is uncooperative, pacing in hallways, anxious ad restless. Assisted back to her room many time, non compliant. Safety measures applied, on yellow socks on.kept monitored.

## 2013-11-27 MED ORDER — LORAZEPAM 2 MG/ML IJ SOLN: 1.0000 mg | Freq: Once | INTRAMUSCULAR | Status: DC

## 2013-11-27 NOTE — Progress Notes (Signed)
Received phone call from [place] at [address] requesting additional information including: current vitals, updated nursing notes, MAR and IVC.     Jamie [name] Disposition MHT

## 2013-11-27 NOTE — ED Notes (Signed)
Pt. Is IVC , refused to be on paper scrub , walks to bathroom frequently, personal belongings in 2 bags.

## 2013-11-27 NOTE — ED Notes (Signed)
Pt is alert to self, disoriented to place, time and situation. She is upset because she was told she has to leave the curtains and door open in her room for safety reasons. She is very adamant that she will close the curtain.

## 2013-11-27 NOTE — ED Notes (Addendum)
Pt is beginning to cooperate. She still refuses her medications. She does recognize that she is disoriented. She is asking how she got here and looking for keys. Encouraging patient to get rest and worry about keys in the a.m. After she speaks to the doctor.

## 2013-11-27 NOTE — ED Notes (Signed)
Vitals not assessed at this time, Pt is sleeping.   Respirations are even and unlabored at a rate of 16 per minute.

## 2013-11-27 NOTE — ED Notes (Signed)
She is anxious and constantly paces throughout our unit.  She does agree to take a prn Ativan at this time--will evaluate this later.  She is in no distress, and ambulates without difficulty.  Her skin is normal, warm and dry and she is breathing normally.

## 2013-11-27 NOTE — ED Provider Notes (Signed)
Pt has not been taking her medications today.  She does not want to keep the curtains closed.  Nursing is concerned that she is not cooperating and is at risk for falling.  She refuses to stay in her room.  Pt is on IVC for further evaluations of her delusions.  Pt has not been violent or aggressive.  1mg  ativan Im ordered.  Kathalene Frames, MD 11/27/13 2102

## 2013-11-27 NOTE — ED Notes (Signed)
Dr. Lenna Sciara and team at bedside.

## 2013-11-27 NOTE — ED Notes (Signed)
Patient belongings are in locker 30

## 2013-11-27 NOTE — ED Notes (Signed)
Bed: YW73 Expected date:  Expected time:  Means of arrival:  Comments: TCU - bed

## 2013-11-27 NOTE — Progress Notes (Signed)
Follow-up calls made:  [facility]- per Shirlee Limerick they are currently at capacity and will not review referral until Monday 3.30.15 once they have had d/c's  [facility]- per [facility] at [place]- per Beverlee Nims can fax, will fax referral  [facility]- per Barn[facility] have gero beds available, will fax referral  [facility]- per [facility] 55+ facility at [place] Disposition MHT

## 2013-11-27 NOTE — Consult Note (Signed)
[facility] Consult   Reason for Consult:  Paranoia, Psychosis NOS Referring Physician:  EDP Jamie [name] is an 80 y.o. female. Total Time spent with patient: 45 minutes  Assessment: AXIS I:  PSYCHOSIS NOS, Paranoia AXIS II:  Deferred AXIS III:   Past Medical History  Diagnosis Date  . Hyperlipidemia   . Hypertension   . Obesity     5'3"  . Gastric ulcer 05/2011  . Chronic pain    AXIS IV:  other psychosocial or environmental problems, problems related to social environment and problems with primary support group AXIS V:  31-40 impairment in reality testing  Plan:  Recommend psychiatric Inpatient admission when medically cleared.  Subjective:   Jamie [name] is a 80 y.o. female patient admitted with Psychosis NOS and Paranoia  HPI:  Patient was seen for psychiatric evaluation and safety needs. Patient presented with paranoid ideations, and the usual thinking, confused, feeling unsafe in her own home. Patient was brought  to the ER by [employer]  with c/o people breaking in and entering her house and has multiple phone calls over the 2 weeks but patient never seen anybody in her house but strongly believes her neighbors her breaking into her house. Patient has been living by herself and found herself missing both personal belongings and breaking into her house. She even went as far of changing the locks to her doors without benefit. Patient contacted ADT who told her she cannot afford this because she's on disability benefits. Patient has a niece in [place] but limited support for her. Patient does not feel safe and secure in her house. Patient reports drinking alcohol to treat her anxiety and to socialize with friends. She was not forth coming with the amount of alcohol she drinks. Patient feels hopeless and helpless. She is upset and angry that somebody is coming into her house.  Patient made close observation and comprehensive psychiatric evaluation so we'll seek at a  Geropsychiatry unit for placement.  We will provide safety and stabilization while we look for bed.  HPI Elements:   Location:  Psychosis NOS, Paranoia. Quality:  severe. Severity:  severe, calling . Context:  calling police several times reporting break in that never happened..  Past Psychiatric History: Past Medical History  Diagnosis Date  . Hyperlipidemia   . Hypertension   . Obesity     5'3"  . Gastric ulcer 05/2011  . Chronic pain     reports that she quit smoking about 42 years ago. Her smoking use included Cigarettes. She smoked 1.00 pack per day. She has never used smokeless tobacco. She reports that she drinks alcohol. She reports that she does not use illicit drugs. Family History  Problem Relation Age of Onset  . Hypertension Mother   . Hyperlipidemia Mother   . Heart disease Father     3s  . Cancer Brother    Family History Family Supports: Yes, List: [name], niece. lives in [place]) Living Arrangements: Alone Can pt return to current living arrangement?: Yes   Allergies:  No Known Allergies  ACT Assessment Complete:  No:   Past Psychiatric History: Diagnosis:  Psychosis  Hospitalizations:  Yes, 20 year ago  Outpatient Care:  no  Substance Abuse Care:  denies  Self-Mutilation:  denies  Suicidal Attempts:  denies  Homicidal Behaviors:  denies   Violent Behaviors:  denies   Place of Residence:  [place] Marital Status:  divorced Employed/Unemployed:  retired Education:  unknown Family Supports:  Yes, by phone Objective: Blood pressure 161/85, pulse 87, temperature 98.2 F (36.8 C), temperature source Oral, resp. rate 16, SpO2 97.00%.There is no weight on file to calculate BMI. Results for orders placed during the hospital encounter of 11/26/13 (from the past 72 hour(s))  CBC WITH DIFFERENTIAL     Status: Abnormal   Collection Time    11/26/13  8:27 AM      Result Value Ref Range   WBC 7.1  4.0 - 10.5 K/uL   RBC 5.14 (*) 3.87 - 5.11 MIL/uL    Hemoglobin 15.7 (*) 12.0 - 15.0 g/dL   HCT 47.0 (*) 36.0 - 46.0 %   MCV 91.4  78.0 - 100.0 fL   MCH 30.5  26.0 - 34.0 pg   MCHC 33.4  30.0 - 36.0 g/dL   RDW 14.2  11.5 - 15.5 %   Platelets 237  150 - 400 K/uL   Neutrophils Relative % 70  43 - 77 %   Neutro Abs 5.0  1.7 - 7.7 K/uL   Lymphocytes Relative 20  12 - 46 %   Lymphs Abs 1.4  0.7 - 4.0 K/uL   Monocytes Relative 9  3 - 12 %   Monocytes Absolute 0.6  0.1 - 1.0 K/uL   Eosinophils Relative 1  0 - 5 %   Eosinophils Absolute 0.0  0.0 - 0.7 K/uL   Basophils Relative 0  0 - 1 %   Basophils Absolute 0.0  0.0 - 0.1 K/uL  COMPREHENSIVE METABOLIC PANEL     Status: Abnormal   Collection Time    11/26/13  8:27 AM      Result Value Ref Range   Sodium 143  137 - 147 mEq/L   Potassium 4.0  3.7 - 5.3 mEq/L   Chloride 105  96 - 112 mEq/L   CO2 25  19 - 32 mEq/L   Glucose, Bld 119 (*) 70 - 99 mg/dL   BUN 14  6 - 23 mg/dL   Creatinine, Ser 0.94  0.50 - 1.10 mg/dL   Calcium 9.7  8.4 - 10.5 mg/dL   Total Protein 7.2  6.0 - 8.3 g/dL   Albumin 3.9  3.5 - 5.2 g/dL   AST 16  0 - 37 U/L   ALT 14  0 - 35 U/L   Alkaline Phosphatase 84  39 - 117 U/L   Total Bilirubin 0.7  0.3 - 1.2 mg/dL   GFR calc non Af Amer 55 (*) >90 mL/min   GFR calc Af Amer 64 (*) >90 mL/min   Comment: (NOTE)     The eGFR has been calculated using the CKD EPI equation.     This calculation has not been validated in all clinical situations.     eGFR's persistently <90 mL/min signify possible Chronic Kidney     Disease.  ETHANOL     Status: None   Collection Time    11/26/13  8:27 AM      Result Value Ref Range   Alcohol, Ethyl (B) <11  0 - 11 mg/dL   Comment:            LOWEST DETECTABLE LIMIT FOR     SERUM ALCOHOL IS 11 mg/dL     FOR MEDICAL PURPOSES ONLY  CBG MONITORING, ED     Status: Abnormal   Collection Time    11/26/13  8:34 AM      Result Value Ref Range   Glucose-Capillary 107 (*) 70 -  99 mg/dL  URINALYSIS, ROUTINE W REFLEX MICROSCOPIC     Status:  Abnormal   Collection Time    11/26/13 10:31 AM      Result Value Ref Range   Color, Urine YELLOW  YELLOW   APPearance CLOUDY (*) CLEAR   Specific Gravity, Urine 1.017  1.005 - 1.030   pH 6.5  5.0 - 8.0   Glucose, UA NEGATIVE  NEGATIVE mg/dL   Hgb urine dipstick LARGE (*) NEGATIVE   Bilirubin Urine NEGATIVE  NEGATIVE   Ketones, ur NEGATIVE  NEGATIVE mg/dL   Protein, ur 30 (*) NEGATIVE mg/dL   Urobilinogen, UA 0.2  0.0 - 1.0 mg/dL   Nitrite NEGATIVE  NEGATIVE   Leukocytes, UA NEGATIVE  NEGATIVE  URINE RAPID DRUG SCREEN (HOSP PERFORMED)     Status: None   Collection Time    11/26/13 10:31 AM      Result Value Ref Range   Opiates NONE DETECTED  NONE DETECTED   Cocaine NONE DETECTED  NONE DETECTED   Benzodiazepines NONE DETECTED  NONE DETECTED   Amphetamines NONE DETECTED  NONE DETECTED   Tetrahydrocannabinol NONE DETECTED  NONE DETECTED   Barbiturates NONE DETECTED  NONE DETECTED   Comment:            DRUG SCREEN FOR MEDICAL PURPOSES     ONLY.  IF CONFIRMATION IS NEEDED     FOR ANY PURPOSE, NOTIFY LAB     WITHIN 5 DAYS.                LOWEST DETECTABLE LIMITS     FOR URINE DRUG SCREEN     Drug Class       Cutoff (ng/mL)     Amphetamine      1000     Barbiturate      200     Benzodiazepine   097     Tricyclics       353     Opiates          300     Cocaine          300     THC              50  URINE MICROSCOPIC-ADD ON     Status: None   Collection Time    11/26/13 10:31 AM      Result Value Ref Range   WBC, UA 0-2  <3 WBC/hpf   RBC / HPF TOO NUMEROUS TO COUNT  <3 RBC/hpf   Labs are reviewed and are pertinent for Significant for some abnormal lab values.   Current Facility-Administered Medications  Medication Dose Route Frequency Provider Last Rate Last Dose  . atenolol (TENORMIN) tablet 12.5 mg  12.5 mg Oral Daily Orpah Greek, MD   12.5 mg at 11/27/13 1137  . bisoprolol-hydrochlorothiazide (ZIAC) 5-6.25 MG per tablet 1 tablet  1 tablet Oral Daily  Orpah Greek, MD   1 tablet at 11/27/13 0602  . haloperidol lactate (HALDOL) injection 5 mg  5 mg Intramuscular Once Orpah Greek, MD      . LORazepam (ATIVAN) tablet 1 mg  1 mg Oral Q8H PRN Orpah Greek, MD   1 mg at 11/27/13 0800  . nitrofurantoin (macrocrystal-monohydrate) (MACROBID) capsule 100 mg  100 mg Oral BID Orpah Greek, MD   100 mg at 11/27/13 1138  . polyvinyl alcohol (LIQUIFILM TEARS) 1.4 % ophthalmic solution 1 drop  1 drop Both Eyes PRN Harrell Gave  J. Betsey Holiday, MD       Current Outpatient Prescriptions  Medication Sig Dispense Refill  . atenolol (TENORMIN) 25 MG tablet Take 12.5 mg by mouth daily.      . bisoprolol-hydrochlorothiazide (ZIAC) 5-6.25 MG per tablet Take 1 tablet by mouth daily.      . Cholecalciferol (VITAMIN D) 1000 UNITS capsule Take 1,000 Units by mouth daily.        . nitrofurantoin, macrocrystal-monohydrate, (MACROBID) 100 MG capsule Take 100 mg by mouth 2 (two) times daily.       Vladimir Faster Glycol-Propyl Glycol (SYSTANE ULTRA) 0.4-0.3 % SOLN Apply 2 drops to eye as needed (dry eyes).      . vitamin B-12 (CYANOCOBALAMIN) 500 MCG tablet Take 500 mcg by mouth daily.        Psychiatric Specialty Exam:     Blood pressure 161/85, pulse 87, temperature 98.2 F (36.8 C), temperature source Oral, resp. rate 16, SpO2 97.00%.There is no weight on file to calculate BMI.  General Appearance: Casual and Fairly Groomed  Eye Contact::  Good  Speech:  Clear and Coherent and Normal Rate  Volume:  Normal  Mood:  Angry, Anxious, Depressed and Hopeless  Affect:  Congruent, Depressed and Tearful  Thought Process:  Disorganized  Orientation:  Full (Time, Place, and Person)  Thought Content:  Paranoid Ideation  Suicidal Thoughts:  No  Homicidal Thoughts:  No  Memory:  Immediate;   Good Recent;   Good Remote;   Good  Judgement:  Impaired  Insight:  Fair  Psychomotor Activity:  Normal  Concentration:  Good  Recall:  Good  Fund  of Knowledge:Good  Language: Good  Akathisia:  NA  Handed:  Right  AIMS (if indicated):     Assets:  Desire for Improvement  Sleep:      Musculoskeletal: Strength & Muscle Tone: within normal limits and lying down, moved all extremities Gait & Station: lying in bed moved all extremities Patient leans: lying in bed moved all extremities  Treatment Plan Summary:   We will seek placement at a Geropsychiatry unit We will continue to provide safety and stabilization Daily contact with patient to assess and evaluate symptoms and progress in treatment Medication management Patient is accepted for admission but we will be seeking placement at other facilities capable of treating Geriatric/Psychiatric patient   Kearston Putman,JANARDHAHA R. 11/27/2013 5:23 PM

## 2013-11-27 NOTE — ED Notes (Signed)
Jamie [name] and Jamie [name] (neighbors): [phone]

## 2013-11-27 NOTE — ED Notes (Signed)
Attempted to redirect patient multiple times with multiple RN's,NT, security, and [employer].  Called MD to seek other options.

## 2013-11-27 NOTE — ED Notes (Signed)
Patient gave OK for friend to be notified when pt is discharged or transferred.  Contact Leafy Kindle @ [phone]

## 2013-11-27 NOTE — ED Notes (Signed)
Pt becoming more agitated. Constantly requesting pocketbook back. Making numerous phone calls, unable to reach anyone. Pt seems more disoriented. Stating she has never heard of anyone taking someone pocketbook and "how long have I been here already, since 10 oclock? And you're taking other people ahead of me?" Saying she needs her purse to go to her appt. Redirected and reoriented multiple times. Encouraged to wait in room so MD can talk to her shortly. Pt remains in chair at nurses station with sitter.

## 2013-11-28 DIAGNOSIS — F22 Delusional disorders: Secondary | ICD-10-CM

## 2013-11-28 NOTE — ED Notes (Signed)
Patient is resting comfortably. 

## 2013-11-28 NOTE — Progress Notes (Addendum)
Pt referred to [facility] pending review.  Pt accepted to [facility] however no bed available at this time. Anticipated to have a bed tomorrow per Nunzio Cory.  Pt referred to [facility] pending review, per [facility] patient information may have been misplaced.  Pt referred [facility] pending review.   No beds available at [place] [place]  ED CSW 11/28/2013 951am

## 2013-11-28 NOTE — ED Notes (Signed)
Patient ambulating in hall at this time asking to make phone call to her friend. Patient made aware that transportation is on the way to take her to [place].

## 2013-11-28 NOTE — Consult Note (Signed)
Admitted to ED for  Patient presented with paranoid ideations, and the usual thinking, confused, feeling unsafe in her own home. Patient was brought to the ER by [employer] with c/o people breaking in and entering her house and has multiple phone calls over the 2 weeks but patient never seen anybody in her house but strongly believes her neighbors her breaking into her house. Patient has been living by herself and found herself missing both personal belongings and breaking into her house. She even went as far of changing the locks to her doors without benefit. Patient contacted ADT who told her she cannot afford this because she's on disability benefits. Patient has a niece in [place] but limited support for her.  Today continues to endorse paranoia, feels somewhat from neighbour has done something. Its not my fault . She tried to whisper on my ear. Guarded and paranoid  Psychiatric Specialty Exam: Physical Exam  ROS  Blood pressure 138/88, pulse 84, temperature 98 F (36.7 C), temperature source Oral, resp. rate 18, SpO2 94.00%.There is no weight on file to calculate BMI.  General Appearance: Casual  Eye Contact::  Fair  Speech:  Slow  Volume:  Decreased  Mood:  Dysphoric  Affect:  Constricted  Thought Process:  Disorganized  Orientation:  Full (Time, Place, and Person)  Thought Content:  Ideas of Reference:   Paranoia, Paranoid Ideation and Rumination  Suicidal Thoughts:  No  Homicidal Thoughts:  No  Memory:  Recent;   Fair  Judgement:  Poor  Insight:  Shallow  Psychomotor Activity:  Decreased  Concentration:  Fair  Recall:  Fair  Akathisia:  Negative  Handed:  Right  AIMS (if indicated):     Assets:  Leisure Time  Sleep:      Continues to endorse paranoia and needs stablization.  Admit to inpatient unit for medication management.

## 2013-11-28 NOTE — Progress Notes (Signed)
Pt accepted to [facility] by Dr. Anders Simmonds. Patient to be transported by sherrif under IVC. Patient RN can call report to [phone].   Noreene Larsson [phone]  ED CSW 11/28/2013 1204pm

## 2013-11-28 NOTE — ED Notes (Signed)
Pt was not administered Lorazepam injection. Pt was cooperative and calm. After having to be redirected several times. She slept throughout the shift once helped in bed other than 2 urine incontinent episodes. Pt. Went back to sleep and was cooperative to allow vital signs to be taken.

## 2013-11-28 NOTE — ED Notes (Signed)
Patient unable to sign at this time. Patient anxious and crying at this time.

## 2013-11-28 NOTE — Progress Notes (Signed)
Len Childs [phone] pt niece.  Peter Congo [phone] pt friend  Elijah Birk pt friend [phone]  Pt has all of these friends/family on consent to release information. Pt unwilling to share further family contact information at this time.   Noreene Larsson [phone]  ED CSW 11/28/2013 1359pm

## 2013-11-28 NOTE — Progress Notes (Addendum)
Pt signed consent to release information to Leafy Kindle [phone]. CSW informed pt friend that patient is being transferred to [facility].   Noreene Larsson [phone]  ED CSW 11/28/2013 12:18pm.   Pt signed consent to speak with Christiane Ha, and [name].  Everyone has been updated on situation.   Noreene Larsson [phone]  ED CSW 11/28/2013 1337pm   CSW attempted to obtain contact information for pt brother in [place] however pt not wanting to share. CSW provided supportive counseling. Pt feels that she is being punished for being scared and having to be sent to [facility]. Pt did not wish for csw to call any more family at this time.   Noreene Larsson [phone]  ED CSW 11/28/2013 1357pm

## 2013-12-05 ENCOUNTER — Telehealth: Payer: Self-pay | Admitting: Internal Medicine

## 2013-12-05 NOTE — Telephone Encounter (Signed)
Pt's neighbor is calling stating pt wanted him to contact dr. Arnoldo Morale and make him aware that she is currently in [facility] in [facility]. Neighbor states dr.jenkins can call him directly for additional information if needed.

## 2013-12-05 NOTE — Telephone Encounter (Signed)
FYI

## 2013-12-05 NOTE — Telephone Encounter (Signed)
I am aware  

## 2013-12-06 NOTE — Telephone Encounter (Signed)
Dr jenkins is aware 

## 2013-12-07 ENCOUNTER — Telehealth: Payer: Self-pay | Admitting: Internal Medicine

## 2013-12-07 NOTE — Telephone Encounter (Addendum)
Pt friend  Pollyann Samples is calling to sch her friend for post hosp follow up from [facility] in [place]. Dx with paranoid. Pt has appt with NP on 12-13-13

## 2013-12-08 NOTE — Telephone Encounter (Signed)
Pt friend is requesting dr Arnoldo Morale see the patient

## 2013-12-12 NOTE — Telephone Encounter (Signed)
She needs to keep appt with [facility].  No where to fit pt in

## 2013-12-13 ENCOUNTER — Ambulatory Visit (INDEPENDENT_AMBULATORY_CARE_PROVIDER_SITE_OTHER): Payer: Medicare Other | Admitting: Family

## 2013-12-13 ENCOUNTER — Ambulatory Visit: Payer: Self-pay | Admitting: Family

## 2013-12-13 ENCOUNTER — Encounter: Payer: Self-pay | Admitting: Family

## 2013-12-13 VITALS — BP 130/72 | HR 74 | Temp 98.7°F | Ht 63.0 in | Wt 168.0 lb

## 2013-12-13 DIAGNOSIS — F0390 Unspecified dementia without behavioral disturbance: Secondary | ICD-10-CM | POA: Insufficient documentation

## 2013-12-13 DIAGNOSIS — F3289 Other specified depressive episodes: Secondary | ICD-10-CM

## 2013-12-13 DIAGNOSIS — F32A Depression, unspecified: Secondary | ICD-10-CM

## 2013-12-13 DIAGNOSIS — F329 Major depressive disorder, single episode, unspecified: Secondary | ICD-10-CM

## 2013-12-13 NOTE — Patient Instructions (Signed)
1. Jamie [name], Therapist for counseling.  2. Call ans schedule an appt with psychiatry.   Depression, Adult Depression refers to feeling sad, low, down in the dumps, blue, gloomy, or empty. In general, there are two kinds of depression: 1. Depression that we all experience from time to time because of upsetting life experiences, including the loss of a job or the ending of a relationship (normal sadness or normal grief). This kind of depression is considered normal, is short lived, and resolves within a few days to 2 weeks. (Depression experienced after the loss of a loved one is called bereavement. Bereavement often lasts longer than 2 weeks but normally gets better with time.) 2. Clinical depression, which lasts longer than normal sadness or normal grief or interferes with your ability to function at home, at work, and in school. It also interferes with your personal relationships. It affects almost every aspect of your life. Clinical depression is an illness. Symptoms of depression also can be caused by conditions other than normal sadness and grief or clinical depression. Examples of these conditions are listed as follows:  Physical illness Some physical illnesses, including underactive thyroid gland (hypothyroidism), severe anemia, specific types of cancer, diabetes, uncontrolled seizures, heart and lung problems, strokes, and chronic pain are commonly associated with symptoms of depression.  Side effects of some prescription medicine In some people, certain types of prescription medicine can cause symptoms of depression.  Substance abuse Abuse of alcohol and illicit drugs can cause symptoms of depression. SYMPTOMS Symptoms of normal sadness and normal grief include the following:  Feeling sad or crying for short periods of time.  Not caring about anything (apathy).  Difficulty sleeping or sleeping too much.  No longer able to enjoy the things you used to enjoy.  Desire to be by  oneself all the time (social isolation).  Lack of energy or motivation.  Difficulty concentrating or remembering.  Change in appetite or weight.  Restlessness or agitation. Symptoms of clinical depression include the same symptoms of normal sadness or normal grief and also the following symptoms:  Feeling sad or crying all the time.  Feelings of guilt or worthlessness.  Feelings of hopelessness or helplessness.  Thoughts of suicide or the desire to harm yourself (suicidal ideation).  Loss of touch with reality (psychotic symptoms). Seeing or hearing things that are not real (hallucinations) or having false beliefs about your life or the people around you (delusions and paranoia). DIAGNOSIS  The diagnosis of clinical depression usually is based on the severity and duration of the symptoms. Your caregiver also will ask you questions about your medical history and substance use to find out if physical illness, use of prescription medicine, or substance abuse is causing your depression. Your caregiver also may order blood tests. TREATMENT  Typically, normal sadness and normal grief do not require treatment. However, sometimes antidepressant medicine is prescribed for bereavement to ease the depressive symptoms until they resolve. The treatment for clinical depression depends on the severity of your symptoms but typically includes antidepressant medicine, counseling with a mental health professional, or a combination of both. Your caregiver will help to determine what treatment is best for you. Depression caused by physical illness usually goes away with appropriate medical treatment of the illness. If prescription medicine is causing depression, talk with your caregiver about stopping the medicine, decreasing the dose, or substituting another medicine. Depression caused by abuse of alcohol or illicit drugs abuse goes away with abstinence from these substances. Some adults need  professional help  in order to stop drinking or using drugs. SEEK IMMEDIATE CARE IF:  You have thoughts about hurting yourself or others.  You lose touch with reality (have psychotic symptoms).  You are taking medicine for depression and have a serious side effect. FOR MORE INFORMATION National Alliance on Mental Illness: www.nami.[employer]: https://carter.com/ Document Released: 08/15/2000 Document Revised: 02/17/2012 Document Reviewed: 11/17/2011 [facility] Patient Information 2014 [place].

## 2013-12-13 NOTE — Progress Notes (Signed)
Pre visit review using our clinic review tool, if applicable. No additional management support is needed unless otherwise documented below in the visit note. 

## 2013-12-14 ENCOUNTER — Encounter: Payer: Self-pay | Admitting: Family

## 2013-12-14 ENCOUNTER — Telehealth: Payer: Self-pay | Admitting: Internal Medicine

## 2013-12-14 DIAGNOSIS — F22 Delusional disorders: Secondary | ICD-10-CM

## 2013-12-14 NOTE — Progress Notes (Signed)
Subjective:    Patient ID: Jamie [name], female    DOB: Apr 27, 1930, 80 y.o.   MRN: [id number]  HPI 80 year old white female, nonsmoker, presented to hospital followup after being seen for paranoia. She is psychiatric evaluation that revealed Alzheimer's disease and depression. Patient is very distraught about her diagnoses. She's currently taking Aricept 5 mg once daily and risperidone 1 mg at bedtime. She was given a prescription for trazodone 50 mg and Vistaril 25 mg and she has not feel. She has an appointment tomorrow with a Education officer, museum. She has not scheduled a followup with psychiatry at this point. Her family support is minimal. She is originally from [place] where her family resides.   Review of Systems  Constitutional: Negative.   Respiratory: Negative.   Cardiovascular: Negative.   Gastrointestinal: Negative.   Endocrine: Negative.   Genitourinary: Negative.   Musculoskeletal: Negative.   Skin: Negative.   Neurological: Negative.   Hematological: Negative.   Psychiatric/Behavioral: Positive for sleep disturbance and agitation. The patient is nervous/anxious.    Past Medical History  Diagnosis Date  . Hyperlipidemia   . Hypertension   . Obesity     5'3"  . Gastric ulcer 05/2011  . Chronic pain     History   Social History  . Marital Status: Single    Spouse Name: N/A    Number of Children: N/A  . Years of Education: N/A   Occupational History  . Not on file.   Social History Main Topics  . Smoking status: Former Smoker -- 1.00 packs/day    Types: Cigarettes    Quit date: 09/02/1971  . Smokeless tobacco: Never Used  . Alcohol Use: Yes     Comment: occasionally  . Drug Use: No  . Sexual Activity: Not Currently   Other Topics Concern  . Not on file   Social History Narrative   Lives in a townhouse with steps.  Lives alone.  Ambulates without assist device.  Drives, pays own bills, and completes ADLs without assistance.        Neighbor, Jamie [name], is her  emergency contact:  [phone]   Full code                Past Surgical History  Procedure Laterality Date  . Bilateral oophorectomy    . Skin biopsy      nose lesion, noncancerous  . Esophagogastroduodenoscopy  08/12/2012    Procedure: ESOPHAGOGASTRODUODENOSCOPY (EGD);  Surgeon: Arta Silence, MD;  Location: [facility];  Service: Endoscopy;  Laterality: Left;    Family History  Problem Relation Age of Onset  . Hypertension Mother   . Hyperlipidemia Mother   . Heart disease Father     57s  . Cancer Brother     No Known Allergies  Current Outpatient Prescriptions on File Prior to Visit  Medication Sig Dispense Refill  . atenolol (TENORMIN) 25 MG tablet Take 12.5 mg by mouth daily.      . bisoprolol-hydrochlorothiazide (ZIAC) 5-6.25 MG per tablet Take 1 tablet by mouth daily.      . Cholecalciferol (VITAMIN D) 1000 UNITS capsule Take 1,000 Units by mouth daily.        . nitrofurantoin, macrocrystal-monohydrate, (MACROBID) 100 MG capsule Take 100 mg by mouth 2 (two) times daily.       Vladimir Faster Glycol-Propyl Glycol (SYSTANE ULTRA) 0.4-0.3 % SOLN Apply 2 drops to eye as needed (dry eyes).      . vitamin B-12 (CYANOCOBALAMIN) 500 MCG tablet Take 500  mcg by mouth daily.       No current facility-administered medications on file prior to visit.    BP 130/72  Pulse 74  Temp(Src) 98.7 F (37.1 C) (Oral)  Ht 5\' 3"  (1.6 m)  Wt 168 lb (76.204 kg)  BMI 29.77 kg/m2  SpO2 97%chart    Objective:   Physical Exam  Constitutional: She is oriented to person, place, and time. She appears well-developed and well-nourished.  Neck: Normal range of motion. Neck supple.  Cardiovascular: Normal rate, regular rhythm and normal heart sounds.   Pulmonary/Chest: Effort normal and breath sounds normal.  Abdominal: Soft. Bowel sounds are normal.  Musculoskeletal: Normal range of motion.  Neurological: She is alert and oriented to person, place, and time.  Skin: Skin is warm and dry.    Psychiatric: She has a normal mood and affect.          Assessment & Plan:  Jamie [name] was seen today for hospital follow up/depression.  Diagnoses and associated orders for this visit:  Depression  Dementia   Follow-up as scheduled with specialist. Take mediations as prescribed. Hold Vistaril and Trazadone until seen by psych.

## 2013-12-14 NOTE — Telephone Encounter (Signed)
Pt was referred to [facility] they donot except [facility]. Pt is req new referral

## 2013-12-15 ENCOUNTER — Other Ambulatory Visit: Payer: Self-pay

## 2013-12-15 DIAGNOSIS — F22 Delusional disorders: Secondary | ICD-10-CM

## 2013-12-15 NOTE — Telephone Encounter (Signed)
Pt following up on MD'srequest for her to see a psychiatrist . Needs one that accepts her insurance . Pt would prefer to see someone close to her home, like Jamie [name]. Pt states she  Doesn't necessarily need to  see a psychiatrist

## 2013-12-15 NOTE — Telephone Encounter (Signed)
Referral for psychology put in

## 2014-01-04 ENCOUNTER — Telehealth: Payer: Self-pay | Admitting: Internal Medicine

## 2014-01-04 NOTE — Telephone Encounter (Signed)
Patient's neighbor is concerned about Ms. Henry Russel because she has no children and no one to look after her and Ms. Leafy Kindle (the neighbor) is very concerned that a lot of health issues are going on with Ms. Owens Shark. Ms. [name] is interested in getting a number to Ms. Grieshaber's Education officer, museum, but I advised her that she isn't designated to have any kind of access to Ms. Matsushita's medical information. At this point, she would love for someone her to give her a call at [phone]. There are a lot of things happening to Ms. Mcquitty that need some professional attention as soon as possible.

## 2014-01-06 NOTE — Telephone Encounter (Signed)
Told neighbor that was not allowed to discuss any information to her until pt signs a DPR.  She understood and just wanted Dr Arnoldo Morale to know that she is not taking any medications.  Ms. Vessel thinks that she was taken off all her meds.  She stated that pt is going on a downward spiral again.  I told her that Dr Arnoldo Morale would like for pt to [place] if she is having a relapse.  Neighbor verbalized understanding and had no questions.  She get pt to sign DPR when she comes in at next appt.  Nothing further was needed.  FYI sent to Dr Arnoldo Morale

## 2014-01-10 ENCOUNTER — Ambulatory Visit (INDEPENDENT_AMBULATORY_CARE_PROVIDER_SITE_OTHER): Payer: 59 | Admitting: Licensed Clinical Social Worker

## 2014-01-10 DIAGNOSIS — F331 Major depressive disorder, recurrent, moderate: Secondary | ICD-10-CM

## 2014-01-10 DIAGNOSIS — F411 Generalized anxiety disorder: Secondary | ICD-10-CM

## 2014-01-24 ENCOUNTER — Ambulatory Visit (INDEPENDENT_AMBULATORY_CARE_PROVIDER_SITE_OTHER): Payer: Medicare Other | Admitting: Family

## 2014-01-24 ENCOUNTER — Ambulatory Visit (INDEPENDENT_AMBULATORY_CARE_PROVIDER_SITE_OTHER): Payer: 59 | Admitting: Licensed Clinical Social Worker

## 2014-01-24 ENCOUNTER — Encounter: Payer: Self-pay | Admitting: Family

## 2014-01-24 VITALS — BP 170/70 | HR 79 | Temp 98.9°F | Ht 63.0 in | Wt 166.0 lb

## 2014-01-24 DIAGNOSIS — F0280 Dementia in other diseases classified elsewhere without behavioral disturbance: Secondary | ICD-10-CM

## 2014-01-24 DIAGNOSIS — R300 Dysuria: Secondary | ICD-10-CM

## 2014-01-24 DIAGNOSIS — R413 Other amnesia: Secondary | ICD-10-CM

## 2014-01-24 DIAGNOSIS — F3289 Other specified depressive episodes: Secondary | ICD-10-CM

## 2014-01-24 DIAGNOSIS — G309 Alzheimer's disease, unspecified: Secondary | ICD-10-CM

## 2014-01-24 DIAGNOSIS — F32A Depression, unspecified: Secondary | ICD-10-CM

## 2014-01-24 DIAGNOSIS — F331 Major depressive disorder, recurrent, moderate: Secondary | ICD-10-CM

## 2014-01-24 DIAGNOSIS — F411 Generalized anxiety disorder: Secondary | ICD-10-CM

## 2014-01-24 DIAGNOSIS — F329 Major depressive disorder, single episode, unspecified: Secondary | ICD-10-CM

## 2014-01-24 LAB — POCT URINALYSIS DIPSTICK
Bilirubin, UA: NEGATIVE
GLUCOSE UA: NEGATIVE
Ketones, UA: NEGATIVE
Leukocytes, UA: NEGATIVE
NITRITE UA: NEGATIVE
Protein, UA: NEGATIVE
RBC UA: NEGATIVE
Spec Grav, UA: 1.015
UROBILINOGEN UA: 0.2
pH, UA: 7

## 2014-01-24 MED ORDER — RISPERIDONE 1 MG PO TABS: 1.0000 mg | ORAL_TABLET | Freq: Every day | ORAL | Status: DC

## 2014-01-24 MED ORDER — DONEPEZIL HCL 5 MG PO TABS: 5.0000 mg | ORAL_TABLET | Freq: Every day | ORAL | Status: DC

## 2014-01-24 NOTE — Progress Notes (Signed)
Subjective:    Patient ID: Jamie [name], female    DOB: 03-Aug-1930, 80 y.o.   MRN: [id number]  HPI 80 y.o. White female presents today for follow up for anxiety and early onset alzheimer's. Pt presents and states that she is "very anxious, depressed and forgetful". Pt was accompanied by a close friend who states that the patient has "declined since her last visit and is having inability care for herself". The patient states that she " is driving, but is not able to care for herself when it comes to cooking and remembering to take medications. She acknowledges that she has stopped talking all of her medications. Pt also states that she "was having burning with urination, but that has stopped", denies urinary frequency and urinary urgency. She denies fever, fatigue, SOB and change in appetite.     Review of Systems  Constitutional: Negative.   HENT: Negative.   Respiratory: Negative.   Cardiovascular: Negative.   Gastrointestinal: Negative.   Endocrine: Negative.   Genitourinary: Negative for dysuria, urgency and frequency.  Musculoskeletal: Negative.   Skin: Negative.   Allergic/Immunologic: Negative.   Neurological: Negative.   Hematological: Negative.   Psychiatric/Behavioral: Positive for confusion, sleep disturbance and decreased concentration. The patient is nervous/anxious.    Past Medical History  Diagnosis Date  . Hyperlipidemia   . Hypertension   . Obesity     5'3"  . Gastric ulcer 05/2011  . Chronic pain     History   Social History  . Marital Status: Single    Spouse Name: N/A    Number of Children: N/A  . Years of Education: N/A   Occupational History  . Not on file.   Social History Main Topics  . Smoking status: Former Smoker -- 1.00 packs/day    Types: Cigarettes    Quit date: 09/02/1971  . Smokeless tobacco: Never Used  . Alcohol Use: Yes     Comment: occasionally  . Drug Use: No  . Sexual Activity: Not Currently   Other Topics Concern  . Not on  file   Social History Narrative   Lives in a townhouse with steps.  Lives alone.  Ambulates without assist device.  Drives, pays own bills, and completes ADLs without assistance.        Neighbor, [name], is her emergency contact:  [phone]   Full code                Past Surgical History  Procedure Laterality Date  . Bilateral oophorectomy    . Skin biopsy      nose lesion, noncancerous  . Esophagogastroduodenoscopy  08/12/2012    Procedure: ESOPHAGOGASTRODUODENOSCOPY (EGD);  Surgeon: Arta Silence, MD;  Location: [facility];  Service: Endoscopy;  Laterality: Left;    Family History  Problem Relation Age of Onset  . Hypertension Mother   . Hyperlipidemia Mother   . Heart disease Father     26s  . Cancer Brother     No Known Allergies  Current Outpatient Prescriptions on File Prior to Visit  Medication Sig Dispense Refill  . atenolol (TENORMIN) 25 MG tablet Take 12.5 mg by mouth daily.      . bisoprolol-hydrochlorothiazide (ZIAC) 5-6.25 MG per tablet Take 1 tablet by mouth daily.      . Cholecalciferol (VITAMIN D) 1000 UNITS capsule Take 1,000 Units by mouth daily.        Vladimir Faster Glycol-Propyl Glycol (SYSTANE ULTRA) 0.4-0.3 % SOLN Apply 2 drops to eye as needed (  dry eyes).      . vitamin B-12 (CYANOCOBALAMIN) 500 MCG tablet Take 500 mcg by mouth daily.       No current facility-administered medications on file prior to visit.    BP 170/70  Pulse 79  Temp(Src) 98.9 F (37.2 C) (Oral)  Ht 5\' 3"  (1.6 m)  Wt 166 lb (75.297 kg)  BMI 29.41 kg/m2  SpO2 96%chart    Objective:   Physical Exam  Constitutional: She is oriented to person, place, and time. She appears well-developed and well-nourished. She is active.  HENT:  Head: Normocephalic.  Cardiovascular: Normal rate, regular rhythm, normal heart sounds and normal pulses.   Pulmonary/Chest: Effort normal and breath sounds normal.  Abdominal: Soft. Normal appearance and bowel sounds are normal.    Neurological: She is alert and oriented to person, place, and time.  Skin: Skin is warm, dry and intact.  Psychiatric: Her speech is normal. Her mood appears anxious. She is slowed and withdrawn. Thought content is paranoid. Cognition and memory are impaired. She exhibits a depressed mood. She expresses no homicidal and no suicidal ideation. She expresses no suicidal plans and no homicidal plans. She exhibits abnormal recent memory and abnormal remote memory.          Assessment & Plan:  80 y.o. White female presents for follow up for anxiety and new onset Alzheimer.  - UTI: Urinalysis was negative - Memory loss/ Alzheimer   - Start Aricept 5mg  po Qday  - Home health consult to help with ADL's and medications.   - Pt and friend will begin looking for Assisted living facilities.  - Anxiety/ Mood disorder  - Risperidone 1mg  po Qday.  Education: Home health responsibility to help with care until placement in assisted living can be attained. Anxiety reduction.   - Follow up in 3 weeks.

## 2014-01-24 NOTE — Progress Notes (Signed)
Pre visit review using our clinic review tool, if applicable. No additional management support is needed unless otherwise documented below in the visit note. 

## 2014-01-24 NOTE — Patient Instructions (Signed)

## 2014-01-30 ENCOUNTER — Encounter: Payer: Self-pay | Admitting: Internal Medicine

## 2014-01-30 ENCOUNTER — Ambulatory Visit (INDEPENDENT_AMBULATORY_CARE_PROVIDER_SITE_OTHER): Payer: Medicare Other | Admitting: Internal Medicine

## 2014-01-30 VITALS — BP 146/90 | Temp 97.3°F | Ht 63.0 in | Wt 166.0 lb

## 2014-01-30 DIAGNOSIS — M549 Dorsalgia, unspecified: Secondary | ICD-10-CM

## 2014-01-30 DIAGNOSIS — F0390 Unspecified dementia without behavioral disturbance: Secondary | ICD-10-CM

## 2014-01-30 DIAGNOSIS — E538 Deficiency of other specified B group vitamins: Secondary | ICD-10-CM

## 2014-01-30 LAB — POCT URINALYSIS DIPSTICK
Bilirubin, UA: NEGATIVE
Glucose, UA: NEGATIVE
Ketones, UA: NEGATIVE
Nitrite, UA: NEGATIVE
SPEC GRAV UA: 1.02
UROBILINOGEN UA: 0.2
pH, UA: 7

## 2014-01-30 MED ORDER — CEFUROXIME AXETIL 250 MG PO TABS: 250.0000 mg | ORAL_TABLET | Freq: Two times a day (BID) | ORAL | Status: AC

## 2014-01-30 NOTE — Patient Instructions (Signed)
Please follow up with Dr. Arnoldo Morale within 4 weeks Also schedule follow up visit with psychiatrist.

## 2014-01-30 NOTE — Assessment & Plan Note (Addendum)
I doubt patient's symptoms secondary to delirium from urinary tract infection. Her symptoms of paranoia have been ongoing since March of 2015. Her B12 level normal in 10/2013.  TSH normal in 09/2013.  Consider check RPR.  CT of brain in 10/2013 unremarkable.  Her urinalysis was positive for trace blood and +1 leukocytes. Her physical exam is unremarkable. Urine sent for culture. Prescription for cefuroxime 250 mg twice daily for 5 days provided. However patient declines for fear side effects.  Patient urged to follow up with PCP and psychiatry.  If it determined that patient does not have capacity to make medical decisions, consider social work referral (nursing home admission, assistance with estate planning).

## 2014-01-30 NOTE — Assessment & Plan Note (Signed)
Her last vitamin B12 level in March of 2015 was 1192.

## 2014-01-30 NOTE — Progress Notes (Signed)
Subjective:    Patient ID: Jamie [name], female    DOB: 1929-12-21, 80 y.o.   MRN: [id number]  HPI  80 year old white female previously seen by Dr. Elease Hashimoto and nurse practitioner Roxy Cedar returns today with supportive neighbor.  Patient has been experiencing symptoms of paranoia since March of 2015. Patient referred to psychiatry and according to previous notes, she was diagnosed with Alzheimer's disease and depression. Her neighbor says she was recently seen for followup by psychiatrist. Patient raised concern that some of her symptoms may be secondary to urinary tract infection.  Patient denies any dysuria or urinary frequency. She complains of mild low back pain.  Patient lives alone. She has no children or close family. She is accompanied by  neighbor [name])  She complains that there have been items missing from her home. When asked about specifics, she reports tax records have been missing.  She also complains that she experienced side effects from taking antibiotics prescribed by emergency room On 11/26/2013. She reports multiple dark colored discolorations on her skin after taking antibiotics as well as bruising. Medical records reviewed. She was prescribed Macrodantin.     Review of Systems  Negative for fever or chills,  Negative for visual or auditory hallucinations.  Past Medical History  Diagnosis Date  . Hyperlipidemia   . Hypertension   . Obesity     5'3"  . Gastric ulcer 05/2011  . Chronic pain     History   Social History  . Marital Status: Single    Spouse Name: N/A    Number of Children: N/A  . Years of Education: N/A   Occupational History  . Not on file.   Social History Main Topics  . Smoking status: Former Smoker -- 1.00 packs/day    Types: Cigarettes    Quit date: 09/02/1971  . Smokeless tobacco: Never Used  . Alcohol Use: Yes     Comment: occasionally  . Drug Use: No  . Sexual Activity: Not Currently   Other Topics Concern    . Not on file   Social History Narrative   Lives in a townhouse with steps.  Lives alone.  Ambulates without assist device.  Drives, pays own bills, and completes ADLs without assistance.        Neighbor, [name], is her emergency contact:  [phone]   Full code                Past Surgical History  Procedure Laterality Date  . Bilateral oophorectomy    . Skin biopsy      nose lesion, noncancerous  . Esophagogastroduodenoscopy  08/12/2012    Procedure: ESOPHAGOGASTRODUODENOSCOPY (EGD);  Surgeon: Arta Silence, MD;  Location: [facility];  Service: Endoscopy;  Laterality: Left;    Family History  Problem Relation Age of Onset  . Hypertension Mother   . Hyperlipidemia Mother   . Heart disease Father     64s  . Cancer Brother     No Known Allergies  Current Outpatient Prescriptions on File Prior to Visit  Medication Sig Dispense Refill  . atenolol (TENORMIN) 25 MG tablet Take 12.5 mg by mouth daily.      . bisoprolol-hydrochlorothiazide (ZIAC) 5-6.25 MG per tablet Take 1 tablet by mouth daily.      . Cholecalciferol (VITAMIN D) 1000 UNITS capsule Take 1,000 Units by mouth daily.        Marland Kitchen donepezil (ARICEPT) 5 MG tablet Take 1 tablet (5 mg total) by mouth at bedtime.  30 tablet  1  . Polyethyl Glycol-Propyl Glycol (SYSTANE ULTRA) 0.4-0.3 % SOLN Apply 2 drops to eye as needed (dry eyes).      . risperiDONE (RISPERDAL) 1 MG tablet Take 1 tablet (1 mg total) by mouth at bedtime.  30 tablet  1  . vitamin B-12 (CYANOCOBALAMIN) 500 MCG tablet Take 500 mcg by mouth daily.       No current facility-administered medications on file prior to visit.    BP 146/90  Temp(Src) 97.3 F (36.3 C) (Oral)  Ht 5\' 3"  (1.6 m)  Wt 166 lb (75.297 kg)  BMI 29.41 kg/m2       Objective:   Physical Exam  Constitutional: She is oriented to person, place, and time. She appears well-developed and well-nourished. No distress.  HENT:  Head: Normocephalic and atraumatic.  Right Ear: External  ear normal.  Left Ear: External ear normal.  Mouth/Throat: Oropharynx is clear and moist.  Eyes: Conjunctivae and EOM are normal. Pupils are equal, round, and reactive to light.  Cardiovascular: Normal rate, regular rhythm and normal heart sounds.   Pulmonary/Chest: Breath sounds normal. She has no wheezes.  Abdominal: Soft.  Mild suprapubic tenderness  Neurological: She is alert and oriented to person, place, and time. No cranial nerve deficit.  Skin: Skin is warm and dry.  Psychiatric:  Slightly confrontational       Assessment & Plan:

## 2014-02-02 ENCOUNTER — Telehealth: Payer: Self-pay | Admitting: Internal Medicine

## 2014-02-02 NOTE — Telephone Encounter (Signed)
Pt is requesting results from her ua that was done on Monday. Pt was seen by dr. Shawna Orleans.

## 2014-02-07 NOTE — Telephone Encounter (Signed)
Pt returned your call. Pl call pt.

## 2014-02-07 NOTE — Telephone Encounter (Signed)
Called and spoke with pt and pt states she did not return for another urine sample.  Pt states she picked up the abx at [facility].  Pt states she will start it this evening. Pt states she is urinating more now but the flow is better.  Pt states she is frustrated due to Dr. Arnoldo Morale leaving and cannot talk.

## 2014-02-07 NOTE — Telephone Encounter (Signed)
Left a message for return call.  

## 2014-02-07 NOTE — Telephone Encounter (Signed)
She was given results of UA during visit.  It showed 1+ blood and protein.  Did she ever return for urine culture?  Did she decide to take antibiotics.  Is she still having any urinary symptoms?

## 2014-02-14 ENCOUNTER — Encounter: Payer: Self-pay | Admitting: Family

## 2014-02-14 ENCOUNTER — Ambulatory Visit (INDEPENDENT_AMBULATORY_CARE_PROVIDER_SITE_OTHER): Payer: Medicare Other | Admitting: Family

## 2014-02-14 VITALS — BP 140/82 | HR 87 | Temp 98.3°F | Ht 63.0 in | Wt 169.0 lb

## 2014-02-14 DIAGNOSIS — I10 Essential (primary) hypertension: Secondary | ICD-10-CM

## 2014-02-14 DIAGNOSIS — R300 Dysuria: Secondary | ICD-10-CM

## 2014-02-14 LAB — POCT URINALYSIS DIPSTICK
BILIRUBIN UA: NEGATIVE
Blood, UA: NEGATIVE
Glucose, UA: NEGATIVE
Ketones, UA: NEGATIVE
NITRITE UA: NEGATIVE
Protein, UA: NEGATIVE
Spec Grav, UA: 1.015
Urobilinogen, UA: 0.2
pH, UA: 7

## 2014-02-14 NOTE — Progress Notes (Signed)
Subjective:    Patient ID: Jamie [name], female    DOB: 14-Jun-1930, 80 y.o.   MRN: [id number]  HPI 80 year old [place] female, nonsmoker is in today for complaints of urinary frequency and low back pain ongoing since 01/30/2014. Patient was originally seen on 5/26 with symptoms of urinary frequency UA was negative for a urinary tract infection. She returned on 01/30/2014 and had a UA that was positive for trace blood and 1+ leukocytes. Initially, patient declined antibiotic due to fear of side effects. However, didn't take the prescription up from the pharmacy. She believes that the prescription cost her to have a urinary tract infection. She has a history of dementia and paranoia and is currently on Medicare psychiatry. Was instructed to return for urine culture but did not.   Review of Systems  Constitutional: Negative.   Respiratory: Negative.   Cardiovascular: Negative.   Gastrointestinal: Negative.   Endocrine: Negative.   Genitourinary: Positive for frequency. Negative for vaginal discharge.  Musculoskeletal: Positive for back pain.  Skin: Negative.   Neurological: Negative.   Psychiatric/Behavioral: The patient is nervous/anxious.    Past Medical History  Diagnosis Date  . Hyperlipidemia   . Hypertension   . Obesity     5'3"  . Gastric ulcer 05/2011  . Chronic pain     History   Social History  . Marital Status: Single    Spouse Name: N/A    Number of Children: N/A  . Years of Education: N/A   Occupational History  . Not on file.   Social History Main Topics  . Smoking status: Former Smoker -- 1.00 packs/day    Types: Cigarettes    Quit date: 09/02/1971  . Smokeless tobacco: Never Used  . Alcohol Use: Yes     Comment: occasionally  . Drug Use: No  . Sexual Activity: Not Currently   Other Topics Concern  . Not on file   Social History Narrative   Lives in a townhouse with steps.  Lives alone.  Ambulates without assist device.  Drives, pays own bills, and  completes ADLs without assistance.        Neighbor, [name], is her emergency contact:  [phone]   Full code                Past Surgical History  Procedure Laterality Date  . Bilateral oophorectomy    . Skin biopsy      nose lesion, noncancerous  . Esophagogastroduodenoscopy  08/12/2012    Procedure: ESOPHAGOGASTRODUODENOSCOPY (EGD);  Surgeon: Arta Silence, MD;  Location: [facility];  Service: Endoscopy;  Laterality: Left;    Family History  Problem Relation Age of Onset  . Hypertension Mother   . Hyperlipidemia Mother   . Heart disease Father     59s  . Cancer Brother     No Known Allergies  Current Outpatient Prescriptions on File Prior to Visit  Medication Sig Dispense Refill  . atenolol (TENORMIN) 25 MG tablet Take 12.5 mg by mouth daily.      . bisoprolol-hydrochlorothiazide (ZIAC) 5-6.25 MG per tablet Take 1 tablet by mouth daily.      . Cholecalciferol (VITAMIN D) 1000 UNITS capsule Take 1,000 Units by mouth daily.        Marland Kitchen donepezil (ARICEPT) 5 MG tablet Take 1 tablet (5 mg total) by mouth at bedtime.  30 tablet  1  . Polyethyl Glycol-Propyl Glycol (SYSTANE ULTRA) 0.4-0.3 % SOLN Apply 2 drops to eye as needed (dry eyes).      Marland Kitchen  risperiDONE (RISPERDAL) 1 MG tablet Take 1 tablet (1 mg total) by mouth at bedtime.  30 tablet  1  . vitamin B-12 (CYANOCOBALAMIN) 500 MCG tablet Take 500 mcg by mouth daily.       No current facility-administered medications on file prior to visit.    BP 140/82  Pulse 87  Temp(Src) 98.3 F (36.8 C) (Oral)  Ht 5\' 3"  (1.6 m)  Wt 169 lb (76.658 kg)  BMI 29.94 kg/m2  SpO2 98%chart    Objective:   Physical Exam  Constitutional: She is oriented to person, place, and time. She appears well-developed and well-nourished.  Neck: Normal range of motion. Neck supple.  Cardiovascular: Normal rate, regular rhythm and normal heart sounds.   Pulmonary/Chest: Effort normal and breath sounds normal.  Abdominal: Soft. Bowel sounds are normal.   Musculoskeletal: Normal range of motion.  Neurological: She is alert and oriented to person, place, and time.  Skin: Skin is warm and dry.  Psychiatric: She has a normal mood and affect.          Assessment & Plan:  Advised referral to urology. Patient declines at this moment. Urine culture was sent. We'll follow up pending results. Advise followup with psychiatry.

## 2014-02-14 NOTE — Patient Instructions (Signed)

## 2014-02-14 NOTE — Progress Notes (Signed)
Pre visit review using our clinic review tool, if applicable. No additional management support is needed unless otherwise documented below in the visit note. 

## 2014-02-15 ENCOUNTER — Telehealth: Payer: Self-pay | Admitting: Internal Medicine

## 2014-02-15 NOTE — Telephone Encounter (Signed)
Relevant patient education mailed to patient.  

## 2014-02-16 LAB — URINE CULTURE
Colony Count: NO GROWTH
ORGANISM ID, BACTERIA: NO GROWTH

## 2014-03-15 ENCOUNTER — Ambulatory Visit: Payer: Medicare Other | Admitting: Internal Medicine

## 2014-06-28 ENCOUNTER — Encounter: Payer: Self-pay | Admitting: Internal Medicine

## 2014-06-30 ENCOUNTER — Ambulatory Visit (INDEPENDENT_AMBULATORY_CARE_PROVIDER_SITE_OTHER): Payer: Medicare Other

## 2014-06-30 DIAGNOSIS — Z23 Encounter for immunization: Secondary | ICD-10-CM

## 2014-06-30 DIAGNOSIS — Z111 Encounter for screening for respiratory tuberculosis: Secondary | ICD-10-CM

## 2014-07-03 ENCOUNTER — Telehealth: Payer: Self-pay

## 2014-07-03 LAB — TB SKIN TEST
Induration: 0 mm
TB SKIN TEST: NEGATIVE

## 2014-07-03 NOTE — Telephone Encounter (Signed)
Left message to advise Jamie [name] that Abby Potash suggests that she have geriatric internist complete FL2 when pt goes to establish with him in December. Advised to call back to let me know if she would like the form mailed or will be picking it up

## 2014-07-24 ENCOUNTER — Encounter (HOSPITAL_COMMUNITY): Payer: Self-pay | Admitting: Emergency Medicine

## 2014-07-24 ENCOUNTER — Emergency Department (HOSPITAL_COMMUNITY)
Admission: EM | Admit: 2014-07-24 | Discharge: 2014-07-25 | Disposition: A | Discharge status: Home / Self Care | Payer: Medicare Other | Attending: Emergency Medicine | Admitting: Emergency Medicine

## 2014-07-24 ENCOUNTER — Telehealth: Payer: Self-pay | Admitting: Family

## 2014-07-24 DIAGNOSIS — F919 Conduct disorder, unspecified: Secondary | ICD-10-CM | POA: Diagnosis not present

## 2014-07-24 DIAGNOSIS — F419 Anxiety disorder, unspecified: Secondary | ICD-10-CM | POA: Diagnosis not present

## 2014-07-24 DIAGNOSIS — Z87891 Personal history of nicotine dependence: Secondary | ICD-10-CM | POA: Diagnosis not present

## 2014-07-24 DIAGNOSIS — Z8639 Personal history of other endocrine, nutritional and metabolic disease: Secondary | ICD-10-CM | POA: Insufficient documentation

## 2014-07-24 DIAGNOSIS — Z046 Encounter for general psychiatric examination, requested by authority: Secondary | ICD-10-CM | POA: Diagnosis present

## 2014-07-24 DIAGNOSIS — R4689 Other symptoms and signs involving appearance and behavior: Secondary | ICD-10-CM

## 2014-07-24 DIAGNOSIS — Z8719 Personal history of other diseases of the digestive system: Secondary | ICD-10-CM | POA: Insufficient documentation

## 2014-07-24 DIAGNOSIS — I10 Essential (primary) hypertension: Secondary | ICD-10-CM | POA: Insufficient documentation

## 2014-07-24 DIAGNOSIS — Z79899 Other long term (current) drug therapy: Secondary | ICD-10-CM | POA: Diagnosis not present

## 2014-07-24 DIAGNOSIS — E669 Obesity, unspecified: Secondary | ICD-10-CM | POA: Insufficient documentation

## 2014-07-24 DIAGNOSIS — G8929 Other chronic pain: Secondary | ICD-10-CM | POA: Diagnosis not present

## 2014-07-24 DIAGNOSIS — F0390 Unspecified dementia without behavioral disturbance: Secondary | ICD-10-CM | POA: Diagnosis present

## 2014-07-24 DIAGNOSIS — F329 Major depressive disorder, single episode, unspecified: Secondary | ICD-10-CM | POA: Diagnosis not present

## 2014-07-24 LAB — COMPREHENSIVE METABOLIC PANEL
ALK PHOS: 98 U/L (ref 39–117)
ALT: 16 U/L (ref 0–35)
AST: 20 U/L (ref 0–37)
Albumin: 3.7 g/dL (ref 3.5–5.2)
Anion gap: 14 (ref 5–15)
BILIRUBIN TOTAL: 0.3 mg/dL (ref 0.3–1.2)
BUN: 20 mg/dL (ref 6–23)
CHLORIDE: 99 meq/L (ref 96–112)
CO2: 25 mEq/L (ref 19–32)
Calcium: 10.5 mg/dL (ref 8.4–10.5)
Creatinine, Ser: 0.97 mg/dL (ref 0.50–1.10)
GFR, EST AFRICAN AMERICAN: 61 mL/min — AB (ref >90)
GFR, EST NON AFRICAN AMERICAN: 53 mL/min — AB (ref >90)
GLUCOSE: 112 mg/dL — AB (ref 70–99)
Potassium: 4 mEq/L (ref 3.7–5.3)
SODIUM: 138 meq/L (ref 137–147)
TOTAL PROTEIN: 7.2 g/dL (ref 6.0–8.3)

## 2014-07-24 LAB — URINALYSIS, ROUTINE W REFLEX MICROSCOPIC
Bilirubin Urine: NEGATIVE
Glucose, UA: NEGATIVE mg/dL
HGB URINE DIPSTICK: NEGATIVE
Ketones, ur: NEGATIVE mg/dL
NITRITE: NEGATIVE
PROTEIN: NEGATIVE mg/dL
Specific Gravity, Urine: 1.007 (ref 1.005–1.030)
UROBILINOGEN UA: 0.2 mg/dL (ref 0.0–1.0)
pH: 6 (ref 5.0–8.0)

## 2014-07-24 LAB — CBC
HEMATOCRIT: 44.8 % (ref 36.0–46.0)
HEMOGLOBIN: 14.9 g/dL (ref 12.0–15.0)
MCH: 31.5 pg (ref 26.0–34.0)
MCHC: 33.3 g/dL (ref 30.0–36.0)
MCV: 94.7 fL (ref 78.0–100.0)
Platelets: 230 10*3/uL (ref 150–400)
RBC: 4.73 MIL/uL (ref 3.87–5.11)
RDW: 13.9 % (ref 11.5–15.5)
WBC: 9.3 10*3/uL (ref 4.0–10.5)

## 2014-07-24 LAB — RAPID URINE DRUG SCREEN, HOSP PERFORMED
AMPHETAMINES: NOT DETECTED
BARBITURATES: NOT DETECTED
Benzodiazepines: NOT DETECTED
Cocaine: NOT DETECTED
OPIATES: NOT DETECTED
TETRAHYDROCANNABINOL: NOT DETECTED

## 2014-07-24 LAB — ACETAMINOPHEN LEVEL

## 2014-07-24 LAB — URINE MICROSCOPIC-ADD ON

## 2014-07-24 LAB — SALICYLATE LEVEL: Salicylate Lvl: <2 mg/dL — ABNORMAL LOW (ref 2.8–20.0)

## 2014-07-24 LAB — ETHANOL: Alcohol, Ethyl (B): <11 mg/dL (ref 0–11)

## 2014-07-24 NOTE — Progress Notes (Signed)
  CARE MANAGEMENT ED NOTE 07/24/2014  Patient:  Jamie [name], Jamie [name]   Account Number:  [id number]  Date Initiated:  07/24/2014  Documentation initiated by:  Livia Snellen  Subjective/Objective Assessment:   Patient IVC'd by her neighbors for confusion and bizarre behavior. One neighbot [name] is patient's POA     Subjective/Objective Assessment Detail:   Patient with pmhx of hyperlipidemia, HTN, chronic pain, anxiety and depression.     Action/Plan:   Action/Plan Detail:   Anticipated DC Date:       Status Recommendation to Physician:   Result of Recommendation:    Other ED [place]  Other  PCP issues    Choice offered to / List presented to:  C-1 Patient          Status of service:  Completed, signed off  ED Comments:   ED Comments Detail:  EDCM spoke to patient at bedisde with EDSW. Patient listed as having [employer].  Patient able to tell [facility] month, year and her birthday correctly. Patient reports today's date is the 22nd.  Patient lives alone.  Patient reports she does not have any medical equipment at home.  Patient reports she ia able to complete her own ADL's without difficulty.  Patient reports she is still driving.  Patient reports she prepares herself [employer] to eat.  Patient reports she has no family. Patient reports her pcp used to be Dr. Benay Pillow with [facility], "But he retired."   [facility] asked patient why she was here?  Patient responded, "Because someone thinks I'm crazy.  My neighbor [name] is going away to [place] for thre months and she wants to put me into a facility so she brought me here to speed things up a bit."  Patient is tearful during the interview.  Patient stated, "I'm just so upset about the whole situation."  EDSW asked patient if she wanted to go to a nursing facility?  Paient responded, "I have been thinking about it but I need a little time. Can I at least get through the  holidays?"  Patient reports she would like someone to come to her home and cook her a meal.  EDCm offered patient private duty agency list and explained to patient it would be an out of pocket expense. [facility] also encouraged patient to apply for Medicaid for ALF. Patient asked how to apply for Medicaid.?  EDCM provided patient with phone number to DSS.  Patient reports she wouldn't be opposed to a nurse checking in on her.  EDCM provided patient with  a list of home health agencies in [facility]  Patient thankful for resources.  No further EDCM needs  at this time.

## 2014-07-24 NOTE — ED Notes (Signed)
Pt brought in by [employer] and family as IVC. Family states pt has dementia and early alzheimer's disease and has not been able to take care of herself. Increasingly paranoid and more disheveled hygiene. Pt is also drinking wine heavily and "just wants to die."

## 2014-07-24 NOTE — ED Provider Notes (Signed)
CSN: [id number]     Arrival date & time 07/24/14  1626 History   First MD Initiated Contact with Patient 07/24/14 1731     Chief Complaint  Patient presents with  . Medical Clearance     (Consider location/radiation/quality/duration/timing/severity/associated sxs/prior Treatment) Patient is a 80 y.o. female presenting with altered mental status.  Altered Mental Status Presenting symptoms: behavior changes and confusion   Severity:  Severe Most recent episode: past several weeks. Episode history:  Continuous Timing:  Constant Progression:  Worsening Chronicity:  Recurrent (similar symptoms back in April when she was admitted to a psychiatric unit in [place]) Context: alcohol use and dementia   Associated symptoms: no fever   Associated symptoms comment:  Ear congestion.  No sinus pain, no sore throat.   Past Medical History  Diagnosis Date  . Hyperlipidemia   . Hypertension   . Obesity     5'3"  . Gastric ulcer 05/2011  . Chronic pain   . Anxiety   . Depression    Past Surgical History  Procedure Laterality Date  . Bilateral oophorectomy    . Skin biopsy      nose lesion, noncancerous  . Esophagogastroduodenoscopy  08/12/2012    Procedure: ESOPHAGOGASTRODUODENOSCOPY (EGD);  Surgeon: Arta Silence, MD;  Location: [facility];  Service: Endoscopy;  Laterality: Left;  . Abdominal hysterectomy      PARTIAL   Family History  Problem Relation Age of Onset  . Hypertension Mother   . Hyperlipidemia Mother   . Heart disease Father     67s  . Cancer Brother    History  Substance Use Topics  . Smoking status: Former Smoker -- 1.00 packs/day    Types: Cigarettes    Quit date: 09/02/1971  . Smokeless tobacco: Never Used  . Alcohol Use: Yes     Comment: occasionally   OB History    No data available     Review of Systems  Constitutional: Negative for fever.  Psychiatric/Behavioral: Positive for confusion.  All other systems reviewed and are  negative.     Allergies  Pollen extract  Home Medications   Prior to Admission medications   Medication Sig Start Date End Date Taking? Authorizing Provider  atenolol (TENORMIN) 25 MG tablet Take 12.5 mg by mouth daily.    Historical Provider, MD  bisoprolol-hydrochlorothiazide [facility]) 5-6.25 MG per tablet Take 1 tablet by mouth daily.    Historical Provider, MD  Cholecalciferol (VITAMIN D) 1000 UNITS capsule Take 1,000 Units by mouth daily.      Historical Provider, MD  donepezil (ARICEPT) 5 MG tablet Take 1 tablet (5 mg total) by mouth at bedtime. 01/24/14   Timoteo Gaul, FNP  Polyethyl Glycol-Propyl Glycol (SYSTANE ULTRA) 0.4-0.3 % SOLN Apply 2 drops to eye as needed (dry eyes).    Historical Provider, MD  risperiDONE (RISPERDAL) 1 MG tablet Take 1 tablet (1 mg total) by mouth at bedtime. 01/24/14   Timoteo Gaul, FNP  vitamin B-12 (CYANOCOBALAMIN) 500 MCG tablet Take 500 mcg by mouth daily.    Historical Provider, MD   BP 167/95 mmHg  Pulse 76  Temp(Src) 97.8 F (36.6 C) (Oral)  Resp 18  SpO2 95% Physical Exam  Constitutional: She is oriented to person, place, and time. She appears well-developed and well-nourished. No distress.  HENT:  Head: Normocephalic and atraumatic.  Mouth/Throat: Oropharynx is clear and moist.  Canal occluded by cerumen bilaterally  Eyes: Conjunctivae are normal. Pupils are equal, round, and reactive to light.  No scleral icterus.  Neck: Neck supple.  Cardiovascular: Normal rate, regular rhythm, normal heart sounds and intact distal pulses.   No murmur heard. Pulmonary/Chest: Effort normal and breath sounds normal. No stridor. No respiratory distress. She has no rales.  Abdominal: Soft. Bowel sounds are normal. She exhibits no distension. There is no tenderness.  Musculoskeletal: Normal range of motion.  Neurological: She is alert and oriented to person, place, and time.  Skin: Skin is warm and dry. No rash noted.  Psychiatric: She has a  normal mood and affect. Her behavior is normal.  Nursing note and vitals reviewed.   ED Course  Procedures (including critical care time) Labs Review Labs Reviewed  COMPREHENSIVE METABOLIC PANEL - Abnormal; Notable for the following:    Glucose, Bld 112 (*)    GFR calc non Af Amer 53 (*)    GFR calc Af Amer 61 (*)    All other components within normal limits  SALICYLATE LEVEL - Abnormal; Notable for the following:    Salicylate Lvl <4.7 (*)    All other components within normal limits  ACETAMINOPHEN LEVEL  CBC  ETHANOL  URINE RAPID DRUG SCREEN (HOSP PERFORMED)  URINALYSIS, ROUTINE W REFLEX MICROSCOPIC    Imaging Review No results found.   EKG Interpretation None      MDM   Final diagnoses:  Behavioral change    80 yo female with history of dementia presenting with her neighbors (one of whom is her HCPOA) due to gradually worsening bizarre behavior, paranoia, not taking her medications.  Recent ED visit and hospitalization for similar symptoms (about 8 months ago.)  No signs or symptoms of organic disease.  Labs unremarkable.  TTS has evaluated and will re-eval in the AM.    Artis Delay, MD 07/25/14 0020

## 2014-07-24 NOTE — Progress Notes (Addendum)
CSW met with Pt. There was no family at bedside. The Pt was alert and oriented to place, time and situation. However, the Pt is under IVC. Pt states she is originally from [place], but has been in the [place] for years and does not have any biological family members.  Pt says she lives alone, drives, and does all ADL's independently. The Pt stated she is not opposed to a nursing facility in the future, but does not want to be rushed into making a decison. Pt informed CSW and Nurse CM that she may be interested in a private duty nurse. The pt says she has been diagnosed with depression, but denies dementia or alzheimer's. Statements in the chart per, POA seem inconsistent with Pt.    Willette Brace [phone] ED CSW 07/24/2014 11:19 PM

## 2014-07-24 NOTE — BH Assessment (Signed)
Spoke with Dr. Doy Mince before initiating assessment. Per Dr. Doy Mince some labs are still pending. Pt reported to ED with similar sx and was sent to [facility] in [place] per [place].  Assessment to commence shortly.    Lear Ng, [facility] Triage Specialist 07/24/2014 7:43 PM

## 2014-07-24 NOTE — ED Notes (Signed)
Plan of care explained to patient.  She is accompanied by 2 friends, 1 of which has HPOA and POA. Copies made and put in chart.  Informed CSW and TTS,

## 2014-07-24 NOTE — Telephone Encounter (Signed)
Patient Information:  Caller Name: Jamie [name]  Phone: [phone]  Patient: Jamie [name]  Gender: Female  DOB: 1930-06-15  Age: 80 Years  PCP: Benay Pillow (Adults only, leaving end of July 2015)  Office Follow Up:  Does the office need to follow up with this patient?: Yes  Instructions For The Office: Please see Reason for Call & Symptoms.   Symptoms Reason For Call & Symptoms:  [place], calls today with mutliple issues that are going on with patient present though most significant to the caller is patient's new onset of difficulty hearing since 11/21. Jamie [name] explains multiple issues at this time to include--patient is not bathing or caring for herself, non-compliant with medications, may have symptoms of UTI as a result of not bathing and caring for self, and  has been diagnosed with dementia/Alzheimers.When Jamie [name] tries to step in and assist patient, patient becomes belligerent and "personally attacks" Jamie [name]. Jamie [name] states if patient needs to be evaluated she will have a difficult time getting her to any facility for evaluation. Jamie [name] explains that in the past she has called 911 due to patient refusing to go for evaluation though patient cancels the 911 call from her phone from upstairs. Police are then involved and are "very familiar" with the patient and that has been the only way in the past to get patient evaluated. Jamie [name] reports the patient needs help and needs to be in an Assisted Living facility. Jamie [name] has an Assisted Living facility who is holding a room for the patient and the patient refuses to acknowledge or commit to going to live there. Jamie [name] further explains that patient is withdrawn and tells Jamie [name] she just wishes the police would come and take her and lock her up. Jamie [name] also suspects an issue with alcohol, stating "she always goes to the wine." Jamie [name] states she is "done" with all of this as it is wearing on her and she just needs a break. Jamie [name]  explains she must get "everything" settled before she leaves for [place] on September 01, 2014 for a 3 month stay. Jamie [name] requests that the police be involved again with taking the patient to the ED for evaluation and that the ED be made aware that they need to use this as an opportunity to get the patient into an Assisted Living facility rather than the patient returning home. Report called to [place] who requests a note via Epic for John Day, PCP, to review. Jamie [name] is made aware that Abby Potash will review and to expcet a call back from the office to her number [phone]. Jamie [name] appreciative of time spent and assistance provided.    Reviewed Health History In EMR: Yes  Reviewed Medications In EMR: Yes  Reviewed Allergies In EMR: Yes  Reviewed Surgeries / Procedures: Yes  Date of Onset of Symptoms: 07/22/2014  Treatments Tried: Zyrtec  Treatments Tried Worked: No  Guideline(s) Used:  Hearing Loss  Hay Fever - Nasal Allergies  Disposition Per Guideline:   See Today or Tomorrow in Office  Reason For Disposition Reached:   Nasal discharge present > 10 days  Advice Given:  N/A  Patient Will Follow Care Advice:  YES

## 2014-07-24 NOTE — BH Assessment (Addendum)
Relayed results of assessment to Patriciaann Clan, [place]. Per Frederico Hamman PA pt should be seen by psychiatry in AM to uphold or rescind IVC.   Discussed recommendations with Dr. Doy Mince and he will put in a social work consult.   Informed pt and nurse of plan. Informed pt of plan.  Lear Ng, [facility] Triage Specialist 07/24/2014 8:39 PM

## 2014-07-24 NOTE — BH Assessment (Signed)
Tele Assessment Note   Jamie [name] is an 80 y.o. female. Brought in under IVC petitioned by her medical POA Jamie [name] [phone], due to concerns that pt has had decreased self-care, decreased grooming and eating, not taking her medications, not following up with PCP since June, and increased paranoia with memory loss. Jamie [name] has been looking to get pt placed in assisted living and reports she will be out of town for three months and does not feel pt is safe to live alone. Jamie [name] reports arrangements have been made for three month respite at [facility] with plans to determine permanent placement after three months. Pt reports she know she needs to look into assisted living but does not want to rush this decision. She reports she is upset about how this is being handled and feel POA is rushing to get this done too quickly. Pt is alert and oriented to person, place, and situation. She does not know the date. Pt denies SI/HI, self-harm, AVH, or SA. She reports she has been drinking since childhood in [place] but never to excess. She reports increased in drinking to 1-2 glasses of wine daily due to recent stressors. Pt reports she is anxious some about living alone and worries what would happen if she fell. Pt reports some items were missing from her home last year, gold jewelry and tax papers, and that it would be unsafe to make any accusations until she has more facts. Jamie [name] reports pt has mentioned concerns about former neighbor stealing from pt, and that pt is now [name] motives. Pt denies SI but reports sometimes she thinks it would be easier if God did not let her wake up. Pt reports some depressive sx isolating recently related to current stressors and taking baths less frequently. She reports she sleeps and eats well. Denies hx of abuse or trauma.   Axis I:  311 Unspecified Depressive Disorder with Anxious Distress  Rule out Dementia and Early Alzheimer's  Axis II: Deferred Axis III:   Past Medical History  Diagnosis Date  . Hyperlipidemia   . Hypertension   . Obesity     5'3"  . Gastric ulcer 05/2011  . Chronic pain   . Anxiety   . Depression    Axis IV: housing problems, problems with access to health care services and problems with primary support group Axis V: 51-60 moderate symptoms  Past Medical History:  Past Medical History  Diagnosis Date  . Hyperlipidemia   . Hypertension   . Obesity     5'3"  . Gastric ulcer 05/2011  . Chronic pain   . Anxiety   . Depression     Past Surgical History  Procedure Laterality Date  . Bilateral oophorectomy    . Skin biopsy      nose lesion, noncancerous  . Esophagogastroduodenoscopy  08/12/2012    Procedure: ESOPHAGOGASTRODUODENOSCOPY (EGD);  Surgeon: Arta Silence, MD;  Location: [facility];  Service: Endoscopy;  Laterality: Left;  . Abdominal hysterectomy      PARTIAL    Family History:  Family History  Problem Relation Age of Onset  . Hypertension Mother   . Hyperlipidemia Mother   . Heart disease Father     55s  . Cancer Brother     Social History:  reports that she quit smoking about 42 years ago. Her smoking use included Cigarettes. She smoked 1.00 pack per day. She has never used smokeless tobacco. She reports that she drinks alcohol. She reports that she does  not use illicit drugs.  Additional Social History:  Alcohol / Drug Use Pain Medications: SEE PTA Prescriptions: SEE PTA Over the Counter: SEE PTA History of alcohol / drug use?: No history of alcohol / drug abuse (POA reports concerns over wine consumption, pt reports 1-2 glasses of wine per day) Longest period of sobriety (when/how long): NA Negative Consequences of Use:  (denies) Withdrawal Symptoms:  (none reported)  CIWA: CIWA-Ar BP: 167/95 mmHg Pulse Rate: 76 COWS:    PATIENT STRENGTHS: (choose at least two) Ability for insight Communication skills  Allergies:  Allergies  Allergen Reactions  . Pollen Extract Other  (See Comments)    Swollen Eyes & Runny Nose.    Home Medications:  (Not in a hospital admission)  OB/GYN Status:  No LMP recorded. Patient is postmenopausal.  General Assessment Data [facility] of [facility] Is this a Tele or Face-to-Face Assessment?: Face-to-Face Is this an Initial Assessment or a Re-assessment for this encounter?: Initial Assessment Living Arrangements: Alone Can pt return to current living arrangement?: Yes Admission Status: Involuntary Is patient capable of signing voluntary admission?: No Transfer from: Home Referral Source: Self/Family/Friend     [facility] Arrangements: Alone Name of Psychiatrist: none Name of Therapist: none  Education Status Is patient currently in school?: No Current Grade: NA Highest grade of school patient has completed: [employer]  in [place]  Name of school: NA Contact person: [place] [phone]  Risk to self with the past 6 months Suicidal Ideation: No Suicidal Intent: No Is patient at risk for suicide?: No Suicidal Plan?: No Access to Means: No What has been your use of drugs/alcohol within the last 12 months?: Pt reports she drinks 1-2 glasses of wine daily which is slight decrease for her, she reports this is due to conflict over moving to assisted living Previous Attempts/Gestures: No How many times?: 0 Other Self Harm Risks: none Triggers for Past Attempts: None known Intentional Self Injurious Behavior: None Family Suicide History: No Recent stressful life event(s): Other (Comment) (aging, considering assisted living) Persecutory voices/beliefs?: No Depression: Yes Depression Symptoms: Tearfulness, Isolating, Feeling angry/irritable (related to current situation ) Substance abuse history and/or treatment for substance abuse?: No Suicide prevention information given to non-admitted patients: Not applicable  Risk to Others within the past 6  months Homicidal Ideation: No Thoughts of Harm to Others: No Current Homicidal Intent: No Current Homicidal Plan: No Access to Homicidal Means: No Identified Victim: none History of harm to others?: No Assessment of Violence: None Noted Violent Behavior Description: none Does patient have access to weapons?: No Criminal Charges Pending?: No Does patient have a court date: No  Psychosis Hallucinations: None noted Delusions: None noted  Mental Status Report Appear/Hygiene: In scrubs, Unremarkable Eye Contact: Good Motor Activity: Unremarkable Speech: Logical/coherent Level of Consciousness: Alert Mood: Depressed Affect: Appropriate to circumstance Anxiety Level: Moderate Thought Processes: Coherent, Relevant, Circumstantial Judgement: Partial Orientation: Person, Place, Situation (did not know date) Obsessive Compulsive Thoughts/Behaviors: None  Cognitive Functioning Concentration: Normal Memory: Unable to Assess (per documentation hx of alzheimers and dementia) IQ: Average Insight: Fair Impulse Control: Fair Appetite: Good Weight Loss: 0 Weight Gain: 0 Sleep: No Change Total Hours of Sleep: 10 Vegetative Symptoms: Decreased grooming (waiting longer to bathe per pt)  ADLScreening ([place]) Patient's cognitive ability adequate to safely complete daily activities?: Yes Patient able to express need for assistance with ADLs?: Yes Independently performs ADLs?: Yes (appropriate for developmental age)  Prior Inpatient Therapy Prior Inpatient Therapy: Yes Prior Therapy Dates: 3-15 Prior Therapy Facilty/Provider(s): [facility]  Reason for Treatment: depression per pt, paranoia per Health care POA  Prior Outpatient Therapy Prior Outpatient Therapy: No Prior Therapy Dates: NA Prior Therapy Facilty/Provider(s): NA Reason for Treatment: NA  ADL Screening (condition at time of admission) Patient's cognitive ability adequate to safely complete daily  activities?: Yes Is the patient deaf or have difficulty hearing?: No Does the patient have difficulty seeing, even when wearing glasses/contacts?: Yes (in left eye) Does the patient have difficulty concentrating, remembering, or making decisions?:  (per documentation hx of dementia and early alzheimers) Patient able to express need for assistance with ADLs?: Yes Does the patient have difficulty dressing or bathing?: No Independently performs ADLs?: Yes (appropriate for developmental age) Weakness of Legs: None Weakness of Arms/Hands: None  Home Assistive Devices/Equipment Home Assistive Devices/Equipment: None    Abuse/Neglect Assessment (Assessment to be complete while patient is alone) Physical Abuse: Denies Verbal Abuse: Denies Sexual Abuse: Denies Exploitation of patient/patient's resources: Denies Self-Neglect: Denies Values / Beliefs Cultural Requests During Hospitalization: Other (comment) ([place]) Spiritual Requests During Hospitalization: None ([employer])   Regulatory affairs officer ([place]) Does patient have an advance directive?: No Would patient like information on creating an advanced directive?: No - patient declined information Nutrition Screen- [facility] Adult/[facility] Patient's home diet: Regular  Additional Information 1:1 In Past 12 Months?: No CIRT Risk: No Elopement Risk: No Does patient have medical clearance?: No (labs pending)     Disposition:  Per Patriciaann Clan, PA pt to be seen in AM by psychiatry to uphold or rescind IVC.   Lear Ng, [facility] Triage Specialist 07/24/2014 8:55 PM  Disposition Initial Assessment Completed for this Encounter: Yes  Malaky Tetrault M 07/24/2014 8:54 PM

## 2014-07-24 NOTE — Telephone Encounter (Signed)
Pt's POA, [name], states that she needs help getting pt into assisted living. She states pt's attorney's office advised taking pt to [facility] ER for evaluation and admission into assisted living facility. [name] also wants police to assist he with getting pt there because, per [name], pt is belligerent and she thinks alcohol may be involved. [name] will contact police station to get assistance with transporting pt to ER

## 2014-07-25 ENCOUNTER — Encounter (HOSPITAL_COMMUNITY): Payer: Self-pay | Admitting: Registered Nurse

## 2014-07-25 MED ORDER — LORAZEPAM 1 MG PO TABS
0.0000 mg | ORAL_TABLET | Freq: Four times a day (QID) | ORAL | Status: DC
  Administered 2014-07-25 (×3): 1 mg via ORAL
  Filled 2014-07-25 (×3): qty 1

## 2014-07-25 MED ORDER — ACETAMINOPHEN 325 MG PO TABS
650.0000 mg | ORAL_TABLET | ORAL | Status: DC | PRN
  Administered 2014-07-25: 650 mg via ORAL
  Filled 2014-07-25: qty 2

## 2014-07-25 MED ORDER — RISPERIDONE 1 MG PO TABS
1.0000 mg | ORAL_TABLET | Freq: Every day | ORAL | Status: DC
  Administered 2014-07-25: 1 mg via ORAL
  Filled 2014-07-25: qty 1

## 2014-07-25 MED ORDER — DONEPEZIL HCL 5 MG PO TABS
5.0000 mg | ORAL_TABLET | Freq: Every day | ORAL | Status: DC
  Administered 2014-07-25: 5 mg via ORAL
  Filled 2014-07-25 (×2): qty 1

## 2014-07-25 MED ORDER — BISOPROLOL-HYDROCHLOROTHIAZIDE 5-6.25 MG PO TABS
1.0000 | ORAL_TABLET | Freq: Every day | ORAL | Status: DC
  Administered 2014-07-25: 1 via ORAL
  Filled 2014-07-25: qty 1

## 2014-07-25 MED ORDER — LORAZEPAM 1 MG PO TABS: 0.0000 mg | ORAL_TABLET | Freq: Two times a day (BID) | ORAL | Status: DC

## 2014-07-25 MED ORDER — VITAMIN B-1 100 MG PO TABS
100.0000 mg | ORAL_TABLET | Freq: Every day | ORAL | Status: DC
  Administered 2014-07-25: 100 mg via ORAL
  Filled 2014-07-25: qty 1

## 2014-07-25 MED ORDER — THIAMINE HCL 100 MG/ML IJ SOLN: 100.0000 mg | Freq: Every day | INTRAMUSCULAR | Status: DC

## 2014-07-25 MED ORDER — ATENOLOL 12.5 MG HALF TABLET
12.5000 mg | ORAL_TABLET | Freq: Every day | ORAL | Status: DC
  Administered 2014-07-25: 12.5 mg via ORAL
  Filled 2014-07-25: qty 1

## 2014-07-25 NOTE — Progress Notes (Addendum)
[facility] ED CM reviewed pt EPIC chart, CM noted 07/24/14 1216 & 1414 EPIC notes Staffat pcp office "Healthcare Power of [place], [name], calls today with mutliple issues that are going on with patient present though most significant to the caller is patient's new onset of difficulty hearing since 11/21. [name] explains multiple issues at this time to include--patient is not bathing or caring for herself, non-compliant with medications, may have symptoms of UTI as a result of not bathing and caring for self, and has been diagnosed with dementia/Alzheimers.When [name] tries to step in and assist patient, patient becomes belligerent and "personally attacks" [name]. [name] states if patient needs to be evaluated she will have a difficult time getting her to any facility for evaluation. [name] explains that in the past she has called 911 due to patient refusing to go for evaluation though patient cancels the 911 call from her phone from upstairs. Police are then involved and are "very familiar" with the patient and that has been the only way in the past to get patient evaluated. [name] reports the patient needs help and needs to be in an Assisted Living facility. [name] has an Assisted Living facility who is holding a room for the patient and the patient refuses to acknowledge or commit to going to live there. [name] further explains that patient is withdrawn and tells [name] she just wishes the police would come and take her and lock her up. [name] also suspects an issue with alcohol, stating "she always goes to the wine." [name] states she is "done" with all of this as it is wearing on her and she just needs a break. [name] explains she must get "everything" settled before she leaves for [place] on September 01, 2014 for a 3 month stay. [name] requests that the police be involved again with taking the patient to the ED for evaluation and that the ED be made aware that they need to use this as an opportunity to get the  patient into an Assisted Living facility rather than the patient returning home. Report called to [place] who requests a note via Epic for [place], PCP, to review. [name] is made aware that Abby Potash will review and to expcet a call back from the office to her number [phone]. [name] appreciative of time spent and assistance provided. Shared information with [facility] NP/MD during rounds  0955 spoke with EDP about need for home health orders for [facility], PT, SW to assist pt with ALF from community if medically and psychiatrically cleared In June 2015 office visits noted pcp mentions "She has a history of dementia and paranoia and is currently on Medicare psychiatry.

## 2014-07-25 NOTE — ED Notes (Signed)
Pt coming out of room repeatedly stating she was unable to sleep. Pt asked for door to be closed and the curtain pulled because of the light from the hallway. Ativan 1mg  po given to help pt sleep.

## 2014-07-25 NOTE — ED Notes (Addendum)
Pt walked out of room stating that she needed to walk around. Pt redirected to her room and explained to pt that it was not safe to walk around without assistance.  Pt turned around to walk back to her room, lost her footing outside of the door to her room and fell on the right side of her body landing on her right hip. Pt denies any pain to hip at this time.  Pt did not on the floor. Pt has a skin tear on the right elbow. Skin tear cleaned and bandaid placed on the sight. Pt assisted with standing and was able to ambulate back to the room with assistance. VSS. Pt placed back in bed and bed alarm set on pt. Door left open so that pt was visible and explained to pt to call for assistance before getting up. Notified Dr. Claudine Mouton of pt falling. [place] charge nurse notified.

## 2014-07-25 NOTE — Progress Notes (Addendum)
1227 ED CM updated TCU Rn that CM spoke with [name] and [name] would pick pt up "after I eat my lunch"  Encouraged TCU Rn to call CM or [facility]y [facility] ED nursing director prn 1218 ED CM called to speak [name] after Pt provided permission. CM discussed the plan of care for pt for home health, Pt competency and found able to make her decisions in [facility] ED by EDP & BH MD staff. Referred [name] to PCP for "FL2" assist.  Discussed pt wanting to go home and not [facility].  Discussed with [name] that pt gave Cm permission to state that she felt that her being sent to ED on 07/24/14 was "hurtful" and she wants to later work on placement if needed from home CM spoke with pt in detail before speaking with yvonne Pt informed Cm [name]to [place] in January" Pt reports she still drives Cm discussed with pt when POA status would take affect (if she was deemed incompetent) Encouraged pt to speak with [name] about how she feels about this ED visit, going to a ALF and how she feels her "wishes are not being heard" 1217 1215 CM called and spoke with Cyril Mourning of [facility] to discussed home health referral  1107 CM left a voice message for [name] at [phone] Informed her Cm received message left with [facility] RN/EDP and CM needed to speak with her CM left TCU unit number [phone] CM informed pt of the call from [facility] about the [facility] bed.  CM asked pt if she wanted to got to [place] stated "no, I want to go home."  1103 ED CM consulted by EDP, Pollina who received a call from [facility] unit RN after receiving a call from [place] who informed the [facility] RN that [facility] has  Bed for pt today.  RN provided Yvonne's number as [phone]

## 2014-07-25 NOTE — Consult Note (Signed)
  Patient is an elderly patient who was IVC'd by her power of attorney and informed that patient has a history of Dementia/Alzheimer and that patient had made comments of wanting to die and refusing medications.  Spoke with the nurse of patient and was informed that patient was doing well and eating her breakfast. States that power of attorney has found a assisted living home for patient.   Patient is not suicidal/homicidal, psychotic, or paranoia.  There is no reason for psychiatric to see patient .  Patient has been cleared by psychiatry and EDP/SW will handle disposition and placement of patient.  Dr. Darleene Cleaver agrees with this plan.  Shuvon B. Rankin FNP-BC   Patient seen, evaluated and I agree with notes by Nurse Practitioner. Corena Pilgrim, MD

## 2014-07-25 NOTE — ED Notes (Signed)
Pt walking out of the room multiple times asking for Dr. Doy Mince. Explained to pt that Dr. Doy Mince was currently seeing other pts and would be back to see her at a later time. Pt verbalized understanding.

## 2014-07-25 NOTE — Progress Notes (Signed)
Jamie [name], (friend, previous neighbor of 14 years and power of attorney) stated that [facility] is holding a bed for her today. She would like her to be discharged directly to the facility. She also stated that [facility] is willing to pick her up. The facility closes at 5 so arrangements should be made before then if this placement is accepted. Jamie [name]. Writer let the social worker in the [place] know this information [phone]).   Bedelia Person, M.S., LPCA, [facility] Licensed Professional Counselor Associate  Triage Specialist  [facility]  Therapeutic Triage Services Phone: [phone] Fax: [phone]

## 2014-07-25 NOTE — ED Notes (Signed)
Pt continues to have complaints of not being able to sleep. Pt states "I'm wide awake". Ativan 1mg  po given.

## 2014-07-25 NOTE — Discharge Instructions (Signed)
Confusion Confusion is the inability to think with your usual speed or clarity. Confusion may come on quickly or slowly over time. How quickly the confusion comes on depends on the cause. Confusion can be due to any number of causes. CAUSES   Concussion, head injury, or head trauma.  Seizures.  Stroke.  Fever.  Brain tumor.  Age related decreased brain function (dementia).  Heightened emotional states like rage or terror.  Mental illness in which the person loses the ability to determine what is real and what is not (hallucinations).  Infections such as a urinary tract infection (UTI).  Toxic effects from alcohol, drugs, or prescription medicines.  Dehydration and an imbalance of salts in the body (electrolytes).  Lack of sleep.  Low blood sugar (diabetes).  Low levels of oxygen from conditions such as chronic lung disorders.  Drug interactions or other medicine side effects.  Nutritional deficiencies, especially niacin, thiamine, vitamin C, or vitamin B.  Sudden drop in body temperature (hypothermia).  Change in routine, such as when traveling or hospitalized. SIGNS AND SYMPTOMS  People often describe their thinking as cloudy or unclear when they are confused. Confusion can also include feeling disoriented. That means you are unaware of where or who you are. You may also not know what the date or time is. If confused, you may also have difficulty paying attention, remembering, and making decisions. Some people also act aggressively when they are confused.  DIAGNOSIS  The medical evaluation of confusion may include:  Blood and urine tests.  X-rays.  Brain and nervous system tests.  Analyzing your brain waves (electroencephalogram or EEG).  Magnetic resonance imaging (MRI) of your head.  Computed tomography (CT) scan of your head.  Mental status tests in which your health care provider may ask many questions. Some of these questions may seem silly or strange,  but they are a very important test to help diagnose and treat confusion. TREATMENT  An admission to the hospital may not be needed, but a person with confusion should not be left alone. Stay with a family member or friend until the confusion clears. Avoid alcohol, pain relievers, or sedative drugs until you have fully recovered. Do not drive until directed by your health care provider. HOME CARE INSTRUCTIONS  What family and friends can do:  To find out if someone is confused, ask the person to state his or her name, age, and the date. If the person is unsure or answers incorrectly, he or she is confused.  Always introduce yourself, no matter how well the person knows you.  Often remind the person of his or her location.  Place a calendar and clock near the confused person.  Help the person with his or her medicines. You may want to use a pill box, an alarm as a reminder, or give the person each dose as prescribed.  Talk about current events and plans for the day.  Try to keep the environment calm, quiet, and peaceful.  Make sure the person keeps follow-up visits with his or her health care provider. PREVENTION  Ways to prevent confusion:  Avoid alcohol.  Eat a balanced diet.  Get enough sleep.  Take medicine only as directed by your health care provider.  Do not become isolated. Spend time with other people and make plans for your days.  Keep careful watch on your blood sugar levels if you are diabetic. SEEK IMMEDIATE MEDICAL CARE IF:   You develop severe headaches, repeated vomiting, seizures, blackouts, or   slurred speech.  There is increasing confusion, weakness, numbness, restlessness, or personality changes.  You develop a loss of balance, have marked dizziness, feel uncoordinated, or fall.  You have delusions, hallucinations, or develop severe anxiety.  Your family members think you need to be rechecked. Document Released: 09/25/2004 Document Revised: 01/02/2014  Document Reviewed: 09/23/2013 ExitCare Patient Information 2015 [employer]. This information is not intended to replace advice given to you by your health care provider. Make sure you discuss any questions you have with your health care provider.  

## 2014-07-25 NOTE — Progress Notes (Signed)
ED CM spoke with Richard at [employer] at [phone] to provide referral for pt. She should be contacted by Monday or Tuesday for home assessment

## 2014-07-25 NOTE — ED Notes (Signed)
Informed CW of need for recind of IVC.  Psychiatry states they did not evaluate patient so not able to recind document.

## 2014-07-26 NOTE — Progress Notes (Addendum)
[phone] ED CM spoke with pt, [phone]; she picked up on the third ring Pt continues to request Cm speak louder because "my allergies are bad. I'm stopped up" Shared with her that [name] called CM about her. Reviewed the converstaion with [name].  Pt informed cm she spoke with "a neighbor" and "with yvonne briefly" today when [name] told her she could not talk long related having to go do an activity.  Pt states she did not tell yvonne about her falling on her closet door but told another neighbor and believes this is how [name] found out Pt informed CM "I checked myself out. I am not hurt and if I felt I was hurt I would go back to the emergency room." "There is so much negative being said about me. I don't know who to trust to speak with." "I do not have any family here" "I am okay." Cm provided Cm office number. Cm confirmed pt has spoken to [facility]formed her CM would f/u Reports she has a pcp appointment on August 11 2014   1500 ED CM consulted by [facility] ED NP after she received a call from [facility] stating she was unable to reach pt. At 1502 CM called Yvonne at [phone] who report pt has not answered her calls, "she is more forgetful today" "has fallen and placed a whole in the wall" , "I am backing off.  I have talked to the attorney." was her response when Cm asked if she had been to see the pt today.  CM informed yvonne that Cm spoke with pt earlier in the day and with [facility] staff who have contacted pt. Kathrin Ruddy for calling and sharing information.

## 2014-07-26 NOTE — Progress Notes (Addendum)
07/26/14 1242 Spoke with Jamie [name] of [facility] about pt f/u interaction and pt needing to return a call to them r/t her phone being down stairs not upstairs and allergy s/s today Jamie [name] able to find in [facility] notes that pt has been contacted   1230 ED CM dialed [phone] home number for pt to f/u after ED d/c.  Pt answered and informed CM she was"plugged up"  Pt explained further that her "allergies are bad.  I can barely hear." CM spoke louder and inquired about home health services status.  Pt reports "there are seven calls on my phone and the phone is down stairs"  Pt requested CM provide [facility] contact number so she could call them.  Cm provided [phone] and encouragd pt to call so home health staff can see her.  CM informed CM she would call back to check on her and status of services in home

## 2014-07-28 NOTE — Progress Notes (Signed)
1054 sent Jamie [name] an email informing the pt has changed her mind and had connection with Jamie [name].   39 Returned a call to pt to update her that Jamie [name] would be calling her 07/27/14 to schedule appt for 07/28/14 home assessment Pt voiced lots of concern with the "cost" " I do not believe it is free"  After Cm read the pamphlet information to her that it is a free community service " I think I have made a mistake" Pt state she has been speaking with "Jamie [name]" & " Jamie a choice connection of [employer] senior places" this is how she found out about [facility].  Reports no longer wanting call from [name] discussed with pt call from [employer] .  Pt states "yes they came the day before yesterday" (which is correct) Pt now states the area in closet door was "cut out and someone has been taking things from me'"  Reminded pt she informed CM she fell with closet damage on 07/26/14.   Pt admits she is "confused"  CM discussed with pt that she has further home health visits that can help her process or determine if she wants snf  1004 Call made to Dr Jamie [name] office [phone]  to updated but office answering machine reports office is closed for the holiday 0955 Cm spoke with Jamie [name] of [facility])  to get update on home care visits. Notes indicate pt refusal of HHRN visit on 07/27/14 (questinong to keep open if no primary caregiver) when called but confirms pt seen on 07/27/14 by PT Stated pt alert & oriented able to state date, yr, president name, admitted to forgetting to take hs medication, POA trying to get her in a facility, POA going to be out of town from December 2015 to April 2016.  Jamie [name] reports pt has not been seen yet by [name] called Jamie [name] of care patrol to provide a referral Jamie [name] to call pt and attempt to see her on 07/28/14 Jamie [name] is out of town [employer] and email  [phone] As Cm began to enter this information in [place], CM received a call from pt who  greeted her and inquired if CM visited her on 07/26/14.  CM informed pt that CM did not visit her on 07/26/14 and that she may be referring to home health staff.  CM oriented pt to day, time and informed her CM just returned today to [name] office.  Pt states she was reviewing "the sheets" & inquired about costs for Private duty nursing (PDN) and home health [name]) agency services. CM reminded the pt that her [facility] services paid for through her insurance carrier and PDN would be an out of pocket expense Pt voiced understanding. Reports she is "looking into [facility]"  "i don't know about [facility]?"  Cm asked if she was trying to go to [facility] Pt reports "yes someone is recommending it" "I have been there to visit already" Pt agreed to have someone assist her with search of a facility best for her finances.Cm discussed [name] services that helps patients find quality ans safe independent living, assisted living & memory care communities.  Pt agreed for Cm to provide a referral because she is concern primarily with the "cost, finances" "would it be cheaper for me to stay" in a facility  0909 ED returned a call to [phone] [employer] non emergency number and transferred to [place] at [phone]  2287 He was able to find record of a visit to pt home with lst notes entered in system at 2046 on 07/25/14. Indicating pt's neighbor Jamie [name] reported pt with dementia/depression and a threat to herself.  Reports notes indicated pt did not need to be IVC. Jamie Jamie [name] took [employer] mobile number to offer to Jamie [name] [phone] [facility] ED CM received a voice message left on Cm office # by an [employer] Jamie [name] at 2011 on 07/25/14.  He stated there was a call to go to pt home because she reported someone vandalizing her home.  Aware she has dementia.  He questioned her about a fall and injuries noted on her right elbow.  Reports he does not believe this incident calls for IVC or "would be the answer"  Pt gave him CM number.  He  requested a return call to "watch operations" vs the number he called from [phone] for CM to leave a return number so that he may call CM about this

## 2014-07-28 NOTE — Progress Notes (Signed)
07/28/2014 1345 ED Cm received a voice message from pt's neighbor, "Dellia Cloud for [place]"  left at 1146 am on Cm office phone from Pt's home number [phone]  stating that pt informed her she was waiting for someone to come today to "pick her up to take her to [facility]"  Guerry Minors left her number as [phone] and requested a return call for "clarification and to find out what time someone would be coming so she would be ready."  At 1152 am a voice message was left on Cm office phone by the pt from her home number [phone] informing CM she wanted "to wait until after the weekend', "to postpone it til Monday.  I hope to talk to you before you send the people out here. I'm going to go for a walk now"  CM called Loretta at 1330 to clarify with her that no one was being sent from [facility] to pick up pt by CM. Clarified that CM informed pt she may still get a call from [facility] staff to come out to continue to work with her.  Guerry Minors informed CM she was at lunch, pt had a call from [facility] PT and began to ventilate her feelings about the pt's concerns and safety.  Guerry Minors asked CM "what do you think is going to happen to her?"  Cm inquired "what do you mean?" Loretta asked the same question again.  Cm informed Guerry Minors that the pt would decide what she wants to do and when she does anyone helping her can proceed to assist her with her wishes and decision. Guerry Minors informed by CM that "Until Miss Farias decides what she wants to do there is nothing anyone can do until she makes her choice" Referred her to the pt.  Guerry Minors asked "what will happen if she continues to call the police like she did the other night?"  CM informed Guerry Minors CM could not provide her with an answer to her question and referred her to [employer] to ask direct questions of [employer] CM thanked Guerry Minors for her concern for the pt's safety.  CM called the pt at 1336 and updated her on the return call to [facility]. Pt informed CM she had difficulty hearing CM.   Reports having "allergies.  I'm so stopped up. It's hard to hear."  Reports taking zyrtec OTC.  Cm encouraged her to see if Dr Megan Salon could offer something to provide better relief of allergies.  Clarified with pt that no one was being sent from [facility] to [facility] her up. Clarified that CM stated she may still get a call from [facility] staff to come out to continue to work with her.  Pt informed CM she wanted to wait until "[name] returns on "Monday" Pt thanked CM for helping her. Confirmed again she has an appointment with Dr Megan Salon on "December eleventh"

## 2014-08-02 ENCOUNTER — Ambulatory Visit: Payer: Self-pay | Admitting: Internal Medicine

## 2014-08-04 ENCOUNTER — Telehealth: Payer: Self-pay | Admitting: Family

## 2014-08-04 NOTE — Telephone Encounter (Signed)
Noted. Pt's POA has advised me that she is going into assisted living

## 2014-08-04 NOTE — Telephone Encounter (Signed)
[facility] states dr Shawna Orleans put in order for home health care for pt. Pt has declined. Jamie [name] w/ advanced states pt told her she was going into asst living facility and does not need any other services at this time. Refused any help they could give.

## 2014-08-11 ENCOUNTER — Ambulatory Visit: Payer: Medicare Other | Admitting: Family

## 2014-08-14 ENCOUNTER — Ambulatory Visit (INDEPENDENT_AMBULATORY_CARE_PROVIDER_SITE_OTHER): Payer: Medicare Other | Admitting: Family

## 2014-08-14 ENCOUNTER — Other Ambulatory Visit: Payer: Self-pay | Admitting: Family

## 2014-08-14 ENCOUNTER — Encounter: Payer: Self-pay | Admitting: Family

## 2014-08-14 VITALS — BP 152/78 | HR 88 | Ht 63.0 in | Wt 170.2 lb

## 2014-08-14 DIAGNOSIS — I10 Essential (primary) hypertension: Secondary | ICD-10-CM

## 2014-08-14 DIAGNOSIS — F03918 Unspecified dementia, unspecified severity, with other behavioral disturbance: Secondary | ICD-10-CM

## 2014-08-14 DIAGNOSIS — F22 Delusional disorders: Secondary | ICD-10-CM

## 2014-08-14 DIAGNOSIS — F0391 Unspecified dementia with behavioral disturbance: Secondary | ICD-10-CM

## 2014-08-14 MED ORDER — RISPERIDONE 1 MG PO TABS: 1.0000 mg | ORAL_TABLET | Freq: Every day | ORAL | Status: DC

## 2014-08-14 MED ORDER — ATENOLOL 25 MG PO TABS: 12.5000 mg | ORAL_TABLET | Freq: Every day | ORAL | Status: DC

## 2014-08-14 MED ORDER — DONEPEZIL HCL 5 MG PO TABS: 5.0000 mg | ORAL_TABLET | Freq: Every day | ORAL | Status: DC

## 2014-08-14 MED ORDER — BISOPROLOL-HYDROCHLOROTHIAZIDE 5-6.25 MG PO TABS: 1.0000 | ORAL_TABLET | Freq: Every day | ORAL | Status: DC

## 2014-08-14 NOTE — Progress Notes (Signed)
Pre visit review using our clinic review tool, if applicable. No additional management support is needed unless otherwise documented below in the visit note. 

## 2014-08-14 NOTE — Progress Notes (Signed)
Subjective:    Patient ID: Jamie [name], female    DOB: 13-Aug-1930, 80 y.o.   MRN: [id number]  HPI 80 year old [place] female, nonsmoker with a history of dementia, paranoid behavior, hypoparathyroidism, hypertension, Barrett's esophagus is in today to discuss a full 2 paperwork to be placed in an assisted living facility. She does not have any family here. Her power of attorney is her neighbor and does not feel like she can continue caring for her. She other neighbors that assist. Her neighbor reports paranoid and bizarre behavior that's been ongoing and worsening over the last month. She presented emergency department last month and was evaluated. She often misses doses of her medication due to dementia. She will like to have her placed in [place] assisted living facility prior to her going out of town on December 23. However, although this [place] does not have any assistance here, she is somewhat resistant to being placed. Her neighbor reports that she often drinks alcohol with her medications.    Review of Systems  Constitutional: Negative.   HENT: Negative.   Respiratory: Negative.   Cardiovascular: Negative.   Gastrointestinal: Negative.   Endocrine: Negative.   Genitourinary: Negative.   Musculoskeletal: Negative.   Neurological: Negative.   Psychiatric/Behavioral: Positive for confusion and agitation. The patient is nervous/anxious.        Expresses someone, and her house taking things   Past Medical History  Diagnosis Date  . Hyperlipidemia   . Hypertension   . Obesity     5'3"  . Gastric ulcer 05/2011  . Chronic pain   . Anxiety   . Depression     History   Social History  . Marital Status: Divorced    Spouse Name: N/A    Number of Children: N/A  . Years of Education: N/A   Occupational History  . Not on file.   Social History Main Topics  . Smoking status: Former Smoker -- 1.00 packs/day    Types: Cigarettes    Quit date: 09/02/1971  . Smokeless tobacco:  Never Used  . Alcohol Use: Yes     Comment: occasionally  . Drug Use: No  . Sexual Activity: Not Currently   Other Topics Concern  . Not on file   Social History Narrative   Lives in a townhouse with steps.  Lives alone.  Ambulates without assist device.  Drives, pays own bills, and completes ADLs without assistance.  Pt. Current/past profession-Concierge. Pt. Does exercise (walk) 1 X Day. Pt. Does have a living will. Pt. Do have a DNR form. And also Pt. Have signed POA/HPOA forms.      Neighbor, Jamie [name], is her emergency contact:  [phone]   Full code                   Past Surgical History  Procedure Laterality Date  . Bilateral oophorectomy    . Skin biopsy      nose lesion, noncancerous  . Esophagogastroduodenoscopy  08/12/2012    Procedure: ESOPHAGOGASTRODUODENOSCOPY (EGD);  Surgeon: Arta Silence, MD;  Location: [facility];  Service: Endoscopy;  Laterality: Left;  . Abdominal hysterectomy      PARTIAL    Family History  Problem Relation Age of Onset  . Hypertension Mother   . Hyperlipidemia Mother   . Heart disease Father     51s  . Cancer Brother     Allergies  Allergen Reactions  . Pollen Extract Other (See Comments)    Swollen Eyes & Runny  Nose.    Current Outpatient Prescriptions on File Prior to Visit  Medication Sig Dispense Refill  . atenolol (TENORMIN) 25 MG tablet Take 12.5 mg by mouth daily.    . bisoprolol-hydrochlorothiazide (ZIAC) 5-6.25 MG per tablet Take 1 tablet by mouth daily.    . Cholecalciferol (VITAMIN D) 1000 UNITS capsule Take 1,000 Units by mouth daily.      Marland Kitchen donepezil (ARICEPT) 5 MG tablet Take 1 tablet (5 mg total) by mouth at bedtime. 30 tablet 1  . Polyethyl Glycol-Propyl Glycol (SYSTANE ULTRA) 0.4-0.3 % SOLN Apply 2 drops to eye as needed (dry eyes).    . risperiDONE (RISPERDAL) 1 MG tablet Take 1 tablet (1 mg total) by mouth at bedtime. 30 tablet 1  . vitamin B-12 (CYANOCOBALAMIN) 500 MCG tablet Take 500 mcg by mouth daily.      No current facility-administered medications on file prior to visit.    BP 152/78 mmHg  Pulse 88  Ht 5\' 3"  (1.6 m)  Wt 170 lb 3.2 oz (77.202 kg)  BMI 30.16 kg/m2chart.    Objective:   Physical Exam  Constitutional: She is oriented to person, place, and time. She appears well-developed and well-nourished.  HENT:  Right Ear: External ear normal.  Left Ear: External ear normal.  Nose: Nose normal.  Mouth/Throat: Oropharynx is clear and moist.  Neck: Normal range of motion. Neck supple.  Cardiovascular: Normal rate, regular rhythm and normal heart sounds.   Pulmonary/Chest: Effort normal and breath sounds normal.  Abdominal: Soft. Bowel sounds are normal.  Musculoskeletal: Normal range of motion.  Neurological: She is alert and oriented to person, place, and time.  Skin: Skin is warm and dry.  Psychiatric: She has a normal mood and affect.          Assessment & Plan:  Jamie [name] was seen today for follow-up.  Diagnoses and associated orders for this visit:  Dementia with behavioral disturbance - Ambulatory referral to [place] hypertension - Ambulatory referral to [place] behavior    I have explained to Jamie [name] that she is a safety concern being home alone and not being able to manage her medications. She has expressed interest of not wondering to start the assisted living facility prior to Christmas. Therefore, we'll set up home health to assist her with medication management and some of the safety issues in her home. Thereafter, she will be placed in [facility] assisted living where she can have consistent, safe care. Advised no alcohol with medications.

## 2014-08-14 NOTE — Patient Instructions (Signed)

## 2014-08-22 ENCOUNTER — Telehealth: Payer: Self-pay | Admitting: Family

## 2014-08-22 NOTE — Telephone Encounter (Signed)
FYI Jamie [name] is calling to let NP she will relinquish her health care power of attorney. Ms Jamie [name] has call protective service. Pt is not taking her meds and drinking her wine daily around noon. Pt has fallen.

## 2014-08-22 NOTE — Telephone Encounter (Signed)
Noted and forwarded to [facility]

## 2014-08-24 ENCOUNTER — Ambulatory Visit: Payer: Self-pay | Admitting: Nurse Practitioner

## 2014-08-28 NOTE — Telephone Encounter (Signed)
Ms ross cb and would like Tamesha to give her a call.  She wil be leaving or 3 months and wants to talk w/ you before she leaves

## 2014-08-28 NOTE — Telephone Encounter (Signed)
Jamie [name] states that she has sent a copy of a letter stating the she has resigned as HPOA for the pt as advised by the attorney. Ms. [name] will be leaving the state for 3 months and was advised that if she remains HPOA she will be held responsible for pt even while she is away. [name], who is also listed as HPOA, has also resigned, per [employer]. Adult protective services has been contacted

## 2014-09-28 ENCOUNTER — Encounter (HOSPITAL_COMMUNITY): Payer: Self-pay | Admitting: Emergency Medicine

## 2014-09-28 ENCOUNTER — Emergency Department (HOSPITAL_COMMUNITY)
Admission: EM | Admit: 2014-09-28 | Discharge: 2014-10-02 | Payer: Medicare Other | Attending: Emergency Medicine | Admitting: Emergency Medicine

## 2014-09-28 DIAGNOSIS — F419 Anxiety disorder, unspecified: Secondary | ICD-10-CM | POA: Insufficient documentation

## 2014-09-28 DIAGNOSIS — F0390 Unspecified dementia without behavioral disturbance: Secondary | ICD-10-CM | POA: Insufficient documentation

## 2014-09-28 DIAGNOSIS — R4182 Altered mental status, unspecified: Secondary | ICD-10-CM | POA: Diagnosis not present

## 2014-09-28 DIAGNOSIS — E669 Obesity, unspecified: Secondary | ICD-10-CM | POA: Insufficient documentation

## 2014-09-28 DIAGNOSIS — M25561 Pain in right knee: Secondary | ICD-10-CM | POA: Insufficient documentation

## 2014-09-28 DIAGNOSIS — F22 Delusional disorders: Secondary | ICD-10-CM | POA: Diagnosis present

## 2014-09-28 DIAGNOSIS — F1020 Alcohol dependence, uncomplicated: Secondary | ICD-10-CM | POA: Diagnosis present

## 2014-09-28 DIAGNOSIS — F329 Major depressive disorder, single episode, unspecified: Secondary | ICD-10-CM | POA: Diagnosis not present

## 2014-09-28 DIAGNOSIS — Z8719 Personal history of other diseases of the digestive system: Secondary | ICD-10-CM | POA: Diagnosis not present

## 2014-09-28 DIAGNOSIS — G4701 Insomnia due to medical condition: Secondary | ICD-10-CM | POA: Diagnosis not present

## 2014-09-28 DIAGNOSIS — F0391 Unspecified dementia with behavioral disturbance: Secondary | ICD-10-CM | POA: Diagnosis present

## 2014-09-28 DIAGNOSIS — F03918 Unspecified dementia, unspecified severity, with other behavioral disturbance: Secondary | ICD-10-CM | POA: Diagnosis present

## 2014-09-28 DIAGNOSIS — I10 Essential (primary) hypertension: Secondary | ICD-10-CM | POA: Insufficient documentation

## 2014-09-28 DIAGNOSIS — Z79899 Other long term (current) drug therapy: Secondary | ICD-10-CM | POA: Insufficient documentation

## 2014-09-28 DIAGNOSIS — Z87891 Personal history of nicotine dependence: Secondary | ICD-10-CM | POA: Insufficient documentation

## 2014-09-28 DIAGNOSIS — G8929 Other chronic pain: Secondary | ICD-10-CM | POA: Diagnosis not present

## 2014-09-28 DIAGNOSIS — Z046 Encounter for general psychiatric examination, requested by authority: Secondary | ICD-10-CM | POA: Diagnosis present

## 2014-09-28 LAB — CBC
HCT: 47.9 % — ABNORMAL HIGH (ref 36.0–46.0)
Hemoglobin: 15.9 g/dL — ABNORMAL HIGH (ref 12.0–15.0)
MCH: 31.7 pg (ref 26.0–34.0)
MCHC: 33.2 g/dL (ref 30.0–36.0)
MCV: 95.4 fL (ref 78.0–100.0)
PLATELETS: 255 10*3/uL (ref 150–400)
RBC: 5.02 MIL/uL (ref 3.87–5.11)
RDW: 15.6 % — ABNORMAL HIGH (ref 11.5–15.5)
WBC: 7.4 10*3/uL (ref 4.0–10.5)

## 2014-09-28 LAB — COMPREHENSIVE METABOLIC PANEL
ALT: 38 U/L — ABNORMAL HIGH (ref 0–35)
ANION GAP: 10 (ref 5–15)
AST: 49 U/L — ABNORMAL HIGH (ref 0–37)
Albumin: 4.4 g/dL (ref 3.5–5.2)
Alkaline Phosphatase: 123 U/L — ABNORMAL HIGH (ref 39–117)
BILIRUBIN TOTAL: 1.3 mg/dL — AB (ref 0.3–1.2)
BUN: 17 mg/dL (ref 6–23)
CHLORIDE: 104 mmol/L (ref 96–112)
CO2: 26 mmol/L (ref 19–32)
CREATININE: 1.12 mg/dL — AB (ref 0.50–1.10)
Calcium: 10.3 mg/dL (ref 8.4–10.5)
GFR calc Af Amer: 52 mL/min — ABNORMAL LOW (ref >90)
GFR calc non Af Amer: 44 mL/min — ABNORMAL LOW (ref >90)
GLUCOSE: 112 mg/dL — AB (ref 70–99)
Potassium: 4.1 mmol/L (ref 3.5–5.1)
Sodium: 140 mmol/L (ref 135–145)
Total Protein: 7.4 g/dL (ref 6.0–8.3)

## 2014-09-28 LAB — URINALYSIS, ROUTINE W REFLEX MICROSCOPIC
GLUCOSE, UA: NEGATIVE mg/dL
KETONES UR: NEGATIVE mg/dL
NITRITE: NEGATIVE
PH: 7 (ref 5.0–8.0)
PROTEIN: 30 mg/dL — AB
Specific Gravity, Urine: 1.019 (ref 1.005–1.030)
Urobilinogen, UA: 1 mg/dL (ref 0.0–1.0)

## 2014-09-28 LAB — RAPID URINE DRUG SCREEN, HOSP PERFORMED
AMPHETAMINES: NOT DETECTED
Barbiturates: NOT DETECTED
Benzodiazepines: NOT DETECTED
Cocaine: NOT DETECTED
Opiates: NOT DETECTED
TETRAHYDROCANNABINOL: NOT DETECTED

## 2014-09-28 LAB — URINE MICROSCOPIC-ADD ON

## 2014-09-28 LAB — ETHANOL: Alcohol, Ethyl (B): <5 mg/dL (ref 0–9)

## 2014-09-28 MED ORDER — POLYVINYL ALCOHOL 1.4 % OP SOLN
2.0000 [drp] | OPHTHALMIC | Status: DC | PRN
  Administered 2014-09-28 – 2014-10-01 (×3): 2 [drp] via OPHTHALMIC
  Filled 2014-09-28: qty 15

## 2014-09-28 MED ORDER — DONEPEZIL HCL 5 MG PO TABS
5.0000 mg | ORAL_TABLET | Freq: Every day | ORAL | Status: DC
  Administered 2014-09-28 – 2014-10-01 (×4): 5 mg via ORAL
  Filled 2014-09-28 (×6): qty 1

## 2014-09-28 MED ORDER — BISOPROLOL-HYDROCHLOROTHIAZIDE 5-6.25 MG PO TABS
1.0000 | ORAL_TABLET | Freq: Every day | ORAL | Status: DC
Start: 2014-09-28 — End: 2014-10-02
  Administered 2014-09-28 – 2014-10-02 (×5): 1 via ORAL
  Filled 2014-09-28 (×5): qty 1

## 2014-09-28 MED ORDER — CYANOCOBALAMIN 500 MCG PO TABS
500.0000 ug | ORAL_TABLET | Freq: Every day | ORAL | Status: DC
  Administered 2014-09-28 – 2014-10-02 (×5): 500 ug via ORAL
  Filled 2014-09-28 (×5): qty 1

## 2014-09-28 MED ORDER — ATENOLOL 12.5 MG HALF TABLET
12.5000 mg | ORAL_TABLET | Freq: Every day | ORAL | Status: DC
  Administered 2014-09-28 – 2014-10-02 (×5): 12.5 mg via ORAL
  Filled 2014-09-28 (×5): qty 1

## 2014-09-28 MED ORDER — POLYETHYL GLYCOL-PROPYL GLYCOL 0.4-0.3 % OP SOLN: 2.0000 [drp] | OPHTHALMIC | Status: DC | PRN

## 2014-09-28 MED ORDER — RISPERIDONE 1 MG PO TABS
1.0000 mg | ORAL_TABLET | Freq: Every day | ORAL | Status: DC
  Administered 2014-09-28: 1 mg via ORAL
  Filled 2014-09-28: qty 1

## 2014-09-28 MED ORDER — VITAMIN D 1000 UNITS PO TABS
1000.0000 [IU] | ORAL_TABLET | Freq: Every day | ORAL | Status: DC
  Administered 2014-09-28 – 2014-10-02 (×5): 1000 [IU] via ORAL
  Filled 2014-09-28 (×5): qty 1
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
## 2014-09-28 NOTE — ED Notes (Signed)
Pt provided lotion, deodorant, comb, toothbrush, and tooth paste.
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
## 2014-09-28 NOTE — BHH Counselor (Signed)
Per Glenda Chroman, [place], pt meets inpt criteria. TTS to seek gero-psych placement.   EDP (Dr. Vanita Panda) informed of disposition and is in agreement.   Ramond Dial, [facility] Triage Specialist
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
## 2014-09-28 NOTE — ED Notes (Signed)
TTS worker at bedside assessing patient.

## 2014-09-28 NOTE — ED Notes (Signed)
Social Worker at bedside.
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
## 2014-09-29 ENCOUNTER — Emergency Department (HOSPITAL_COMMUNITY): Payer: Medicare Other

## 2014-09-29 DIAGNOSIS — F22 Delusional disorders: Secondary | ICD-10-CM

## 2014-09-29 DIAGNOSIS — F0391 Unspecified dementia with behavioral disturbance: Secondary | ICD-10-CM

## 2014-09-29 DIAGNOSIS — F03918 Unspecified dementia, unspecified severity, with other behavioral disturbance: Secondary | ICD-10-CM | POA: Diagnosis present

## 2014-09-29 DIAGNOSIS — F419 Anxiety disorder, unspecified: Secondary | ICD-10-CM | POA: Diagnosis not present

## 2014-09-29 DIAGNOSIS — G4701 Insomnia due to medical condition: Secondary | ICD-10-CM | POA: Diagnosis not present

## 2014-09-29 DIAGNOSIS — R4182 Altered mental status, unspecified: Secondary | ICD-10-CM | POA: Diagnosis not present

## 2014-09-29 MED ORDER — QUETIAPINE FUMARATE 50 MG PO TABS
50.0000 mg | ORAL_TABLET | Freq: Every day | ORAL | Status: DC
Start: 2014-09-29 — End: 2014-10-02
  Administered 2014-09-29 – 2014-10-01 (×3): 50 mg via ORAL
  Filled 2014-09-29 (×3): qty 1

## 2014-09-29 MED ORDER — CEPHALEXIN 500 MG PO CAPS
500.0000 mg | ORAL_CAPSULE | Freq: Three times a day (TID) | ORAL | Status: AC
  Administered 2014-09-29 – 2014-09-30 (×5): 500 mg via ORAL
  Filled 2014-09-29 (×5): qty 1

## 2014-09-29 MED ORDER — ALPRAZOLAM 0.5 MG PO TABS
0.5000 mg | ORAL_TABLET | Freq: Once | ORAL | Status: AC
  Administered 2014-09-29: 0.5 mg via ORAL
  Filled 2014-09-29: qty 1

## 2014-09-29 MED ORDER — CEPHALEXIN 500 MG PO CAPS: 500.0000 mg | ORAL_CAPSULE | Freq: Three times a day (TID) | ORAL | Status: DC

## 2014-09-29 MED ORDER — LORAZEPAM 0.5 MG PO TABS
0.5000 mg | ORAL_TABLET | Freq: Three times a day (TID) | ORAL | Status: DC | PRN
  Administered 2014-09-29 – 2014-09-30 (×2): 0.5 mg via ORAL
  Filled 2014-09-29 (×5): qty 1

## 2014-09-29 NOTE — Progress Notes (Signed)
Late entry for 1011 09/29/14  [facility] ED picked up phone in [place]. Pt's neighbor [name] requested an updated on pt. [name] shared with CM when CM inquired who she was that she was pt neighbor, "I'm out of town." "I am no longer her POA"  Cm transferred pt to ED RN via Financial controller.   Pt came to TCU desk inquiring to speak with someone about "how I am being treated" CM spoke with pt who did not recall speaking or working with CM in November 2015.  Pt inquired about going to "[place]" Pt redirected to her room and informed she would be seen staff for placement update
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
## 2014-09-29 NOTE — ED Notes (Signed)
Spoke with Theodoro Clock NP order for 0.5 MG Ativan PO q8 hrs.

## 2014-09-29 NOTE — ED Notes (Signed)
2 bags pt belongings placed in locker 28. Inventory sheet placed in pt chart. Valuables locked with security, key in chart.

## 2014-09-29 NOTE — ED Notes (Signed)
Bed: WA27 Expected date:  Expected time:  Means of arrival:  Comments: 

## 2014-09-29 NOTE — BHH Counselor (Signed)
TTS Counselor reviewed pt records and nursing notes in preparation for [facility] assessment. RN on unit showed TTS Counselor to pt's room and introduced pt.   Ramond Dial, [facility] Triage Specialist

## 2014-09-29 NOTE — Progress Notes (Signed)
CSW reached out to [facility] who states that the patient was denied a bed because of dementia.  Also, CSW reached out to [facility]. Nurse states that hospital currently does not have any open beds, but there may be beds open tomorrow.   Nurse from [facility] states that she would call CSW back regarding open beds for patient.  Willette Brace [phone] ED CSW 09/29/2014 10:21 PM

## 2014-09-29 NOTE — ED Notes (Signed)
Pt c/o eyes being dry and puffy

## 2014-09-29 NOTE — ED Provider Notes (Signed)
Pt requesting sleeping aide. Will give 0.5 mg xanax. She is aox3, VSS and WNL.  Varney Biles, MD 09/29/14 [phone]

## 2014-09-29 NOTE — ED Notes (Signed)
Pt at [place] station discussing why she is receiving Antibiotic. Pt calm.

## 2014-09-29 NOTE — ED Notes (Signed)
Pt given recliner and blankets.

## 2014-09-29 NOTE — ED Notes (Signed)
Patient unable to sleep, restless and anxious-out of bed frequently.  Dr. Kathrynn Humble made aware and patient medicated with xanax as ordered

## 2014-09-29 NOTE — ED Notes (Signed)
This RN requested that Pt remain in bed (locked position) Pt repeats that she feels fine and will try to remain in the bed.

## 2014-09-29 NOTE — ED Notes (Signed)
Pt continues to come to Nursing station to find out why she is going to [place]. This RN informed her that I will look into the details and get back with her. Pt returned to room.

## 2014-09-29 NOTE — ED Notes (Signed)
Ate 75% of breakfast

## 2014-09-29 NOTE — ED Provider Notes (Signed)
EKG and CXR ordered for GeriPsych placement. WBCs in urine likely due to hematuria. No white count. Urine culture sent.   EKG Interpretation   Date/Time:  Friday September 29 2014 08:51:50 EST Ventricular Rate:  76 PR Interval:  185 QRS Duration: 84 QT Interval:  392 QTC Calculation: 441 R Axis:   -15 Text Interpretation:  Sinus rhythm Left ventricular hypertrophy Anterior Q  waves, possibly due to LVH No significant change since last tracing  Confirmed by Mingo Amber  MD, [place] ([id number]) on 09/29/2014 8:55:42 AM      DG Chest 2 View (Final result) Result time: 09/29/14 07:41:32   Final result by Rad Results In Interface (09/29/14 07:41:32)   Narrative:   CLINICAL DATA: Altered mental status. Anxious. Insomnia.  EXAM: CHEST 2 VIEW  COMPARISON: 11/29/2007  FINDINGS: The heart is within normal limits in size and stable. There is tortuosity, ectasia and calcification of the thoracic aorta. The moderate-sized hiatal hernia is again demonstrated. Streaky basilar scarring changes but no infiltrates, edema or effusions. The bony thorax is intact.  IMPRESSION: No acute cardiopulmonary findings.  Chronic lung changes.  Moderate-sized hiatal hernia.   Electronically Signed By: Kalman Jewels M.D. On: 09/29/2014 07:41        Evelina Bucy, MD 09/29/14 1058

## 2014-09-29 NOTE — ED Notes (Signed)
Meal tray given to patient.

## 2014-09-29 NOTE — ED Notes (Signed)
MD at bedside. Psych MD present to speak with pt

## 2014-09-29 NOTE — BH Assessment (Addendum)
[facility] Assessment Progress Note  The following facilities have been contacted in an effort to place this pt with results as noted:  *[facility]: beds are available, information faxed with decision pending *[facility]: beds are available, information faxed with decision pending [facility]: beds are available, information faxed with decision pending *[facility]: beds are available, information faxed with decision pending *[facility]: beds are available, fax is currently in transmission *[facility]: beds are available, fax is currently in transmission [facility] no beds available currently, but they will accept referral for future consideration; information has been faxed *[facility]: no beds available currently, but they will accept referral for future consideration; information has been faxed and is currently in transmission [facility]: currently at [facility]: currently at [facility]: currently at [place]: a message was left at 16:11; reply pending  Jalene Mullet, MA Triage Specialist 09/29/2014 @ 16:50

## 2014-09-29 NOTE — ED Notes (Signed)
Pt at nursing station on the telephone with [name], friend.

## 2014-09-29 NOTE — ED Notes (Signed)
This RN was notified by the CNA that the patient had slid out of the recliner (locked position) down to the floor. This RN Soil scientist and also the Attending physician. Pt reports that she feels fine and was assisted back to bed (in locked position). No trauma noted. Denies pain.

## 2014-09-29 NOTE — ED Notes (Signed)
Snack given to pt.

## 2014-09-29 NOTE — ED Notes (Signed)
Patient is given her glasses and her glasses case, so that she is able to read magazines at the bedside.

## 2014-09-29 NOTE — ED Notes (Signed)
Meal Tray at bedside

## 2014-09-30 ENCOUNTER — Emergency Department (HOSPITAL_COMMUNITY): Payer: Medicare Other

## 2014-09-30 DIAGNOSIS — F1020 Alcohol dependence, uncomplicated: Secondary | ICD-10-CM | POA: Diagnosis present

## 2014-09-30 DIAGNOSIS — M25561 Pain in right knee: Secondary | ICD-10-CM | POA: Diagnosis not present

## 2014-09-30 DIAGNOSIS — F10230 Alcohol dependence with withdrawal, uncomplicated: Secondary | ICD-10-CM

## 2014-09-30 DIAGNOSIS — R45851 Suicidal ideations: Secondary | ICD-10-CM

## 2014-09-30 MED ORDER — QUETIAPINE FUMARATE 25 MG PO TABS
25.0000 mg | ORAL_TABLET | Freq: Every day | ORAL | Status: DC
  Administered 2014-10-01 – 2014-10-02 (×2): 25 mg via ORAL
  Filled 2014-09-30 (×2): qty 1

## 2014-09-30 MED ORDER — LORAZEPAM 0.5 MG PO TABS
0.5000 mg | ORAL_TABLET | Freq: Once | ORAL | Status: AC
  Administered 2014-09-30: 0.5 mg via ORAL

## 2014-09-30 MED ORDER — LORAZEPAM 0.5 MG PO TABS
0.5000 mg | ORAL_TABLET | Freq: Four times a day (QID) | ORAL | Status: DC | PRN
  Administered 2014-10-01 – 2014-10-02 (×4): 0.5 mg via ORAL
  Filled 2014-09-30 (×2): qty 1

## 2014-09-30 NOTE — ED Notes (Signed)
Patient is resting comfortably. Well check vitals once awake
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
## 2014-09-30 NOTE — ED Notes (Addendum)
Pt has been getting up and down out of the bed and pacing up and down the floor.
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
## 2014-09-30 NOTE — ED Notes (Signed)
Pt has been very agitated and has not been very cooperative. When asked to go back to sit down in the chair or lay down because she was unsteady and not using the walker that was given to her she ignores the requests and has to be physically escorted back to her room. Pt has been ambulated around the nursing station with someone nearby several times today so that she could get some exercise as she had requested. MD notified of pts behavior. Will continue to monitor for safety.
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
## 2014-09-30 NOTE — BHH Counselor (Signed)
Contacted the following facilities for placement:  INFORMATION ALREADY FAXED, PT IS UNDER REVIEW: [place]: [facility], per [facility], per [facility], per [place]  PT DECLINED: [place], [facility], [facility] Triage Specialist [phone]

## 2014-09-30 NOTE — ED Notes (Signed)
Bed: WA17 Expected date:  Expected time:  Means of arrival:  Comments: Room 27
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
## 2014-10-01 DIAGNOSIS — F10230 Alcohol dependence with withdrawal, uncomplicated: Secondary | ICD-10-CM | POA: Insufficient documentation

## 2014-10-01 LAB — URINE CULTURE

## 2014-10-01 NOTE — ED Provider Notes (Signed)
Pt stable awaiting placement  Maudry Diego, MD 10/01/14 [phone]

## 2014-10-01 NOTE — ED Notes (Signed)
Pt continues to get OOB unassisted and demands to go home.  Pt has asked innumerable times why she is here, each time this writer has explained to pt why she presented to the hospital.  Pt refused to accept explanation of why she is here and insists she was on a different floor previously.  Pt was moved from TCU and it has exacerbated the pt's confusion.  Pt is irritable and uncooperative.

## 2014-10-01 NOTE — ED Notes (Signed)
Belongings in locker 28

## 2014-10-01 NOTE — ED Notes (Signed)
Pt up and ambulatory to bathroom with no assistance

## 2014-10-01 NOTE — Progress Notes (Signed)
CSW made referral to [facility] due to multiple denials due to acuity and denials due to facilities at capacity.  Patient was denied at the following facilities:  [facility]  Denied due to At Capacity: [facility] [place] number obtained from [place] at [facility].  Demographic report given to Levada Dy at [facility].  Pending at [facility]: [place] # [phone] Effective Days: 10/01/14 to 10/07/14  CSW faxed all appropriate information to [place] will continue placement efforts.  Hillery Hunter, LCSW Disposition Social Worker [phone]
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
## 2014-10-02 DIAGNOSIS — F1020 Alcohol dependence, uncomplicated: Secondary | ICD-10-CM

## 2014-10-02 MED ORDER — FLUOXETINE HCL 10 MG PO CAPS
10.0000 mg | ORAL_CAPSULE | Freq: Every day | ORAL | Status: DC
  Administered 2014-10-02: 10 mg via ORAL
  Filled 2014-10-02 (×2): qty 1

## 2014-10-02 NOTE — ED Notes (Addendum)
Patient is resting, easy to arouse. Breathing WNL
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
## 2014-10-02 NOTE — ED Notes (Signed)
Patient is resting comfortably. Easy to arouse. Breathing is WNL

## 2014-10-02 NOTE — Consult Note (Signed)
[name]   Reason for Consult:  Delusions Referring Physician:  EDP Patient Identification: Jamie [name] MRN:  [id number] Principal Diagnosis: Delusions Diagnosis:   Patient Active Problem List   Diagnosis Date Noted  . Alcohol dependence with uncomplicated withdrawal [P37.902]   . Alcohol dependence [F10.20] 09/30/2014  . Dementia with behavioral disturbance [F03.91] 09/29/2014  . Delusions [F22] 09/29/2014  . Dementia [F03.90] 12/13/2013  . GI bleeding [K92.2] 08/12/2012  . Obesity [E66.9]   . Chronic pain [G89.29]   . Gastric ulcer [K25.9] 05/03/2011  . Bladder spasm [N32.89] 02/10/2011  . Murmur, cardiac [R01.1] 11/20/2010  . SHOULDER PAIN, RIGHT [M25.519] 04/15/2010  . LOC OSTEOARTHROS NOT SPEC PRIM/SEC LOWER LEG [M17.9] 04/10/2010  . CALF PAIN, RIGHT [M79.609] 03/01/2010  . CERVICAL STRAIN, ACUTE [S13.9XXA] 02/25/2010  . ANXIETY STATE, UNSPECIFIED [F41.1] 06/20/2009  . SEBACEOUS CYST, INFECTED [L72.3] 06/20/2009  . ALLERGIC RHINITIS DUE TO OTHER ALLERGEN [J30.9] 11/17/2008  . TACHYCARDIA [R00.0] 05/16/2008  . CONTUSION OF BREAST [S20.00XA] 05/05/2008  . HYPERCALCEMIA [E83.52] 01/20/2008  . EPISTAXIS [R04.0] 12/30/2007  . VITAMIN B12 DEFICIENCY [E53.8] 11/23/2007  . APHTHOUS ULCERS [K12.0] 11/23/2007  . ASYMPTOMATIC POSTMENOPAUSAL STATUS [Z78.0] 08/18/2007  . HYPERTENSION [I10] 05/10/2007  . ADVEF, DRUG/MEDICINAL/BIOLOGICAL SUBST NOS [T88.7XXA] 05/10/2007  . HYPERLIPIDEMIA [E78.5] 02/23/2007  ROS   Total Time spent with patient: 30 minutes  Subjective:   Jamie [name] is a 81 y.o. female patient admitted with delusions.  HPI:  The patient remains delusional about her neighbors hurting her, poisoning her, and damaging property.  No signs or symptoms of withdrawal.  Gero-psychiatry placement being sought but her dual diagnosis is hindering her placement.   Reviewed note above with an update from today.  Patient remains paranoid stating that  people especially her neighbors are coming into her house and stealing from her.  Patient reported that they go into her mail box to steal her letters and information.  Patient also stated that"I would rather die than living this way"  When asked if she will kill herself patient stated yes but "I may not have the courage to do it"  Patient is accepted for admission at [facility]. Patient denies Alcohol withdrawal symptoms.  Patient will be transferred to [facility] for treatment.   HPI Elements:   Location:  generalized. Quality:  acute. Severity:  severe. Timing:  constant. Duration:  few weeks. Context:  stressors.  Past Medical History:  Past Medical History  Diagnosis Date  . Hyperlipidemia   . Hypertension   . Obesity     5'3"  . Gastric ulcer 05/2011  . Chronic pain   . Anxiety   . Depression     Past Surgical History  Procedure Laterality Date  . Bilateral oophorectomy    . Skin biopsy      nose lesion, noncancerous  . Esophagogastroduodenoscopy  08/12/2012    Procedure: ESOPHAGOGASTRODUODENOSCOPY (EGD);  Surgeon: Arta Silence, MD;  Location: [facility];  Service: Endoscopy;  Laterality: Left;  . Abdominal hysterectomy      PARTIAL   Family History:  Family History  Problem Relation Age of Onset  . Hypertension Mother   . Hyperlipidemia Mother   . Heart disease Father     55s  . Cancer Brother    Social History:  History  Alcohol Use  . Yes    Comment: occasionally     History  Drug Use No    History   Social History  . Marital Status: Divorced  Spouse Name: N/A    Number of Children: N/A  . Years of Education: N/A   Social History Main Topics  . Smoking status: Former Smoker -- 1.00 packs/day    Types: Cigarettes    Quit date: 09/02/1971  . Smokeless tobacco: Never Used  . Alcohol Use: Yes     Comment: occasionally  . Drug Use: No  . Sexual Activity: Not Currently   Other Topics Concern  . None   Social History Narrative    Lives in a townhouse with steps.  Lives alone.  Ambulates without assist device.  Drives, pays own bills, and completes ADLs without assistance.  Pt. Current/past profession-Concierge. Pt. Does exercise (walk) 1 X Day. Pt. Does have a living will. Pt. Do have a DNR form. And also Pt. Have signed POA/HPOA forms.      Neighbor, [name], is her emergency contact:  [phone]   Full code                  Additional Social History:    Pain Medications: See PTA list Prescriptions: See PTA list Over the Counter: See PTA list History of alcohol / drug use?: Yes Longest period of sobriety (when/how long): Pt denies abuse of etoh Negative Consequences of Use:  (Denies) Withdrawal Symptoms:  (Denies) Name of Substance 1: etoh (Wine) 1 - Age of First Use: 20's 1 - Amount (size/oz): 1-2 glasses 1 - Frequency: Every evening 1 - Duration: Intermittently for past 60 years 1 - Last Use / Amount: A few nights ago                   Allergies:   Allergies  Allergen Reactions  . Pollen Extract Other (See Comments)    Swollen Eyes & Runny Nose.    Vitals: Blood pressure 99/53, pulse 73, temperature 98.1 F (36.7 C), temperature source Oral, resp. rate 16, height 5\' 3"  (1.6 m), weight 74.101 kg (163 lb 5.8 oz), SpO2 95 %.  Risk to Self: Suicidal Ideation: Yes-Currently Present Suicidal Intent: No Is patient at risk for suicide?: No Suicidal Plan?: No Access to Means: No What has been your use of drugs/alcohol within the last 12 months?: Pt reports drinking 1-2 glasses of wine per night How many times?: 0 Other Self Harm Risks: None known Intentional Self Injurious Behavior: None Risk to Others: Homicidal Ideation: No Thoughts of Harm to Others: No Current Homicidal Intent: No Current Homicidal Plan: No Access to Homicidal Means: No Identified Victim: n/a History of harm to others?: No Assessment of Violence: None Noted Violent Behavior Description: pt cooperative, no hx of  violence known Does patient have access to weapons?: No Criminal Charges Pending?: Yes Describe Pending Criminal Charges: Pt says she is being sued - unsure of validity of this Does patient have a court date: No Prior Inpatient Therapy: Prior Inpatient Therapy: Yes Prior Therapy Dates: 07/2014 Prior Therapy Facilty/Provider(s): [facility] Reason for Treatment: Paranoid delusions Prior Outpatient Therapy: Prior Outpatient Therapy: No  Current Facility-Administered Medications  Medication Dose Route Frequency Provider Last Rate Last Dose  . atenolol (TENORMIN) tablet 12.5 mg  12.5 mg Oral Daily Carmin Muskrat, MD   12.5 mg at 10/02/14 0935  . bisoprolol-hydrochlorothiazide (ZIAC) 5-6.25 MG per tablet 1 tablet  1 tablet Oral Daily Carmin Muskrat, MD   1 tablet at 10/02/14 0935  . cholecalciferol (VITAMIN D) tablet 1,000 Units  1,000 Units Oral Daily Carmin Muskrat, MD   1,000 Units at 10/02/14 0935  . cyanocobalamin  tablet 500 mcg  500 mcg Oral Daily Carmin Muskrat, MD   500 mcg at 10/02/14 0935  . donepezil (ARICEPT) tablet 5 mg  5 mg Oral QHS Carmin Muskrat, MD   5 mg at 10/01/14 2235  . FLUoxetine (PROZAC) capsule 10 mg  10 mg Oral Daily Jaeson Molstad   10 mg at 10/02/14 1310  . LORazepam (ATIVAN) tablet 0.5 mg  0.5 mg Oral Q8H PRN Waylan Boga, NP   0.5 mg at 09/30/14 0752  . LORazepam (ATIVAN) tablet 0.5 mg  0.5 mg Oral Q6H PRN Waylan Boga, NP   0.5 mg at 10/02/14 1004  . polyvinyl alcohol (LIQUIFILM TEARS) 1.4 % ophthalmic solution 2 drop  2 drop Both Eyes PRN Carmin Muskrat, MD   2 drop at 10/01/14 1040  . QUEtiapine (SEROQUEL) tablet 50 mg  50 mg Oral QHS Rosabelle Jupin   50 mg at 10/01/14 2237   Current Outpatient Prescriptions  Medication Sig Dispense Refill  . atenolol (TENORMIN) 25 MG tablet Take 0.5 tablets (12.5 mg total) by mouth daily. 45 tablet 3  . bisoprolol-hydrochlorothiazide (ZIAC) 5-6.25 MG per tablet Take 1 tablet by mouth daily. 30 tablet 3  .  Cholecalciferol (VITAMIN D) 1000 UNITS capsule Take 1,000 Units by mouth daily.      Marland Kitchen donepezil (ARICEPT) 5 MG tablet Take 1 tablet (5 mg total) by mouth at bedtime. 30 tablet 3  . Polyethyl Glycol-Propyl Glycol (SYSTANE ULTRA) 0.4-0.3 % SOLN Apply 2 drops to eye as needed (dry eyes).    . risperiDONE (RISPERDAL) 1 MG tablet Take 1 tablet (1 mg total) by mouth at bedtime. 30 tablet 3  . vitamin B-12 (CYANOCOBALAMIN) 500 MCG tablet Take 500 mcg by mouth daily.      Musculoskeletal: Strength & Muscle Tone: within normal limits Gait & Station: normal Patient leans: N/A  Psychiatric Specialty Exam:     Blood pressure 99/53, pulse 73, temperature 98.1 F (36.7 C), temperature source Oral, resp. rate 16, height 5\' 3"  (1.6 m), weight 74.101 kg (163 lb 5.8 oz), SpO2 95 %.Body mass index is 28.95 kg/(m^2).  General Appearance: Casual  Eye Contact::  Good  Speech:  Normal Rate  Volume:  Normal  Mood:  Anxious and Depressed  Affect:  Congruent  Thought Process:  Coherent  Orientation:  Full (Time, Place, and Person)  Thought Content:  Delusions  Suicidal Thoughts:  Yes.  without intent/plan  Homicidal Thoughts:  No  Memory:  Immediate;   Fair Recent;   Poor Remote;   Good  Judgement:  Fair  Insight:  Lacking  Psychomotor Activity:  Normal  Concentration:  Good  Recall:  Good  Fund of Knowledge:Good  Language: Good  Akathisia:  No  Handed:  Right  AIMS (if indicated):     Assets:  Financial Resources/Insurance Housing Leisure Time Resilience  ADL's:  Intact  Cognition: WNL  Sleep:      Medical Decision Making: Review of Psycho-Social Stressors (1), Review or order clinical lab tests (1) and Review of Medication Regimen & Side Effects (2)  Treatment Plan Summary: Daily contact with patient to assess and evaluate symptoms and progress in treatment, Medication management and Plan admit to geropsychiatry  Plan:  Recommend psychiatric Inpatient admission when medically  cleared. Disposition: Accepted at [facility] for [place], C, PMHNP-BC 10/02/2014 1:47 PM  Patient seen, evaluated and I agree with notes by Nurse Practitioner. Corena Pilgrim, MD

## 2014-10-02 NOTE — BH Assessment (Signed)
[place] Assessment Progress Note  At 12:27 [place] from [facility] called.  Pt has been accepted to their facility by Dr Collins Scotland to Rm [id number].  Please call report to [phone]; if unable to reach staff at this number, call [phone].  Charmaine Downs, NP has been notified and she concurs with this decision.  Pt's nurse, Geni Bers, has been informed.  Jalene Mullet, MA Triage Specialist 10/02/2014 @ 12:29

## 2014-10-02 NOTE — ED Notes (Signed)
Patient is resting comfortably. 

## 2014-10-02 NOTE — BH Assessment (Signed)
[place] Assessment Progress Note  At 12:03 I spoke to Robinette at [facility].  She confirms receipt of additional referral information previously requested.  Jalene Mullet, MA Triage Specialist 10/02/2014 @ 12:04

## 2014-10-02 NOTE — BH Assessment (Signed)
[place] Assessment Progress Note  [facility] has been called to check on the status of referral made over the weekend.  I was told that they also require pt's height and weight, as well as an EKG.  Information has been faxed.  Confirmation of receipt is pending at this time.  Jalene Mullet, MA Triage Specialist 10/02/2014 @ 11:09

## 2014-11-01 DIAGNOSIS — G309 Alzheimer's disease, unspecified: Secondary | ICD-10-CM | POA: Diagnosis not present

## 2014-11-01 DIAGNOSIS — F0280 Dementia in other diseases classified elsewhere without behavioral disturbance: Secondary | ICD-10-CM | POA: Diagnosis not present

## 2014-11-02 DIAGNOSIS — G309 Alzheimer's disease, unspecified: Secondary | ICD-10-CM | POA: Diagnosis not present

## 2014-11-02 DIAGNOSIS — F0280 Dementia in other diseases classified elsewhere without behavioral disturbance: Secondary | ICD-10-CM | POA: Diagnosis not present

## 2014-11-09 DIAGNOSIS — F0280 Dementia in other diseases classified elsewhere without behavioral disturbance: Secondary | ICD-10-CM | POA: Diagnosis not present

## 2014-11-09 DIAGNOSIS — G309 Alzheimer's disease, unspecified: Secondary | ICD-10-CM | POA: Diagnosis not present

## 2014-11-10 DIAGNOSIS — F0280 Dementia in other diseases classified elsewhere without behavioral disturbance: Secondary | ICD-10-CM | POA: Diagnosis not present

## 2014-11-10 DIAGNOSIS — G309 Alzheimer's disease, unspecified: Secondary | ICD-10-CM | POA: Diagnosis not present

## 2014-11-11 DIAGNOSIS — G319 Degenerative disease of nervous system, unspecified: Secondary | ICD-10-CM | POA: Diagnosis not present

## 2014-11-12 DIAGNOSIS — G319 Degenerative disease of nervous system, unspecified: Secondary | ICD-10-CM | POA: Diagnosis not present

## 2014-11-25 DIAGNOSIS — G319 Degenerative disease of nervous system, unspecified: Secondary | ICD-10-CM | POA: Diagnosis not present

## 2014-11-26 DIAGNOSIS — G319 Degenerative disease of nervous system, unspecified: Secondary | ICD-10-CM | POA: Diagnosis not present

## 2014-11-27 DIAGNOSIS — F0390 Unspecified dementia without behavioral disturbance: Secondary | ICD-10-CM | POA: Diagnosis not present

## 2014-11-27 DIAGNOSIS — G8929 Other chronic pain: Secondary | ICD-10-CM | POA: Diagnosis not present

## 2014-11-27 DIAGNOSIS — Z7409 Other reduced mobility: Secondary | ICD-10-CM | POA: Diagnosis not present

## 2014-12-05 DIAGNOSIS — I10 Essential (primary) hypertension: Secondary | ICD-10-CM | POA: Diagnosis not present

## 2014-12-05 DIAGNOSIS — D519 Vitamin B12 deficiency anemia, unspecified: Secondary | ICD-10-CM | POA: Diagnosis not present

## 2014-12-05 DIAGNOSIS — E785 Hyperlipidemia, unspecified: Secondary | ICD-10-CM | POA: Diagnosis not present

## 2014-12-08 DIAGNOSIS — Z79899 Other long term (current) drug therapy: Secondary | ICD-10-CM | POA: Diagnosis not present

## 2014-12-13 DIAGNOSIS — D510 Vitamin B12 deficiency anemia due to intrinsic factor deficiency: Secondary | ICD-10-CM | POA: Diagnosis not present

## 2014-12-13 DIAGNOSIS — R319 Hematuria, unspecified: Secondary | ICD-10-CM | POA: Diagnosis not present

## 2014-12-13 DIAGNOSIS — F1021 Alcohol dependence, in remission: Secondary | ICD-10-CM | POA: Diagnosis not present

## 2014-12-13 DIAGNOSIS — R609 Edema, unspecified: Secondary | ICD-10-CM | POA: Diagnosis not present

## 2014-12-13 DIAGNOSIS — I10 Essential (primary) hypertension: Secondary | ICD-10-CM | POA: Diagnosis not present

## 2014-12-19 DIAGNOSIS — R609 Edema, unspecified: Secondary | ICD-10-CM | POA: Diagnosis not present

## 2014-12-19 DIAGNOSIS — R319 Hematuria, unspecified: Secondary | ICD-10-CM | POA: Diagnosis not present

## 2014-12-19 DIAGNOSIS — H6123 Impacted cerumen, bilateral: Secondary | ICD-10-CM | POA: Diagnosis not present

## 2014-12-27 ENCOUNTER — Encounter (HOSPITAL_COMMUNITY): Payer: Self-pay | Admitting: *Deleted

## 2014-12-27 ENCOUNTER — Emergency Department (HOSPITAL_COMMUNITY): Payer: Medicare Other

## 2014-12-27 ENCOUNTER — Inpatient Hospital Stay (HOSPITAL_COMMUNITY)
Admission: EM | Admit: 2014-12-27 | Discharge: 2015-01-01 | DRG: 377 | Disposition: A | Discharge status: Home Health | Payer: Medicare Other | Attending: Internal Medicine | Admitting: Internal Medicine

## 2014-12-27 DIAGNOSIS — K2970 Gastritis, unspecified, without bleeding: Secondary | ICD-10-CM | POA: Diagnosis not present

## 2014-12-27 DIAGNOSIS — M7989 Other specified soft tissue disorders: Secondary | ICD-10-CM | POA: Diagnosis not present

## 2014-12-27 DIAGNOSIS — K921 Melena: Secondary | ICD-10-CM | POA: Diagnosis present

## 2014-12-27 DIAGNOSIS — R3915 Urgency of urination: Secondary | ICD-10-CM | POA: Diagnosis not present

## 2014-12-27 DIAGNOSIS — F32A Depression, unspecified: Secondary | ICD-10-CM | POA: Diagnosis present

## 2014-12-27 DIAGNOSIS — R011 Cardiac murmur, unspecified: Secondary | ICD-10-CM | POA: Diagnosis not present

## 2014-12-27 DIAGNOSIS — Z79899 Other long term (current) drug therapy: Secondary | ICD-10-CM | POA: Diagnosis not present

## 2014-12-27 DIAGNOSIS — F03918 Unspecified dementia, unspecified severity, with other behavioral disturbance: Secondary | ICD-10-CM | POA: Diagnosis present

## 2014-12-27 DIAGNOSIS — T794XXA Traumatic shock, initial encounter: Secondary | ICD-10-CM | POA: Diagnosis not present

## 2014-12-27 DIAGNOSIS — K31811 Angiodysplasia of stomach and duodenum with bleeding: Principal | ICD-10-CM | POA: Diagnosis present

## 2014-12-27 DIAGNOSIS — E785 Hyperlipidemia, unspecified: Secondary | ICD-10-CM | POA: Diagnosis not present

## 2014-12-27 DIAGNOSIS — K449 Diaphragmatic hernia without obstruction or gangrene: Secondary | ICD-10-CM | POA: Diagnosis present

## 2014-12-27 DIAGNOSIS — I10 Essential (primary) hypertension: Secondary | ICD-10-CM | POA: Diagnosis present

## 2014-12-27 DIAGNOSIS — R269 Unspecified abnormalities of gait and mobility: Secondary | ICD-10-CM | POA: Diagnosis not present

## 2014-12-27 DIAGNOSIS — K922 Gastrointestinal hemorrhage, unspecified: Secondary | ICD-10-CM | POA: Diagnosis not present

## 2014-12-27 DIAGNOSIS — E669 Obesity, unspecified: Secondary | ICD-10-CM | POA: Diagnosis present

## 2014-12-27 DIAGNOSIS — R7989 Other specified abnormal findings of blood chemistry: Secondary | ICD-10-CM | POA: Diagnosis not present

## 2014-12-27 DIAGNOSIS — Z7982 Long term (current) use of aspirin: Secondary | ICD-10-CM | POA: Diagnosis not present

## 2014-12-27 DIAGNOSIS — F329 Major depressive disorder, single episode, unspecified: Secondary | ICD-10-CM | POA: Diagnosis present

## 2014-12-27 DIAGNOSIS — L03119 Cellulitis of unspecified part of limb: Secondary | ICD-10-CM | POA: Diagnosis not present

## 2014-12-27 DIAGNOSIS — F419 Anxiety disorder, unspecified: Secondary | ICD-10-CM | POA: Diagnosis present

## 2014-12-27 DIAGNOSIS — F0391 Unspecified dementia with behavioral disturbance: Secondary | ICD-10-CM | POA: Diagnosis present

## 2014-12-27 DIAGNOSIS — Z8249 Family history of ischemic heart disease and other diseases of the circulatory system: Secondary | ICD-10-CM | POA: Diagnosis not present

## 2014-12-27 DIAGNOSIS — D62 Acute posthemorrhagic anemia: Secondary | ICD-10-CM | POA: Diagnosis not present

## 2014-12-27 DIAGNOSIS — R350 Frequency of micturition: Secondary | ICD-10-CM | POA: Diagnosis not present

## 2014-12-27 DIAGNOSIS — Z87891 Personal history of nicotine dependence: Secondary | ICD-10-CM | POA: Diagnosis not present

## 2014-12-27 DIAGNOSIS — Z6831 Body mass index (BMI) 31.0-31.9, adult: Secondary | ICD-10-CM | POA: Diagnosis not present

## 2014-12-27 DIAGNOSIS — L03115 Cellulitis of right lower limb: Secondary | ICD-10-CM | POA: Diagnosis not present

## 2014-12-27 DIAGNOSIS — E876 Hypokalemia: Secondary | ICD-10-CM | POA: Clinically undetermined

## 2014-12-27 DIAGNOSIS — Q2733 Arteriovenous malformation of digestive system vessel: Secondary | ICD-10-CM | POA: Diagnosis not present

## 2014-12-27 DIAGNOSIS — I248 Other forms of acute ischemic heart disease: Secondary | ICD-10-CM | POA: Diagnosis not present

## 2014-12-27 DIAGNOSIS — R579 Shock, unspecified: Secondary | ICD-10-CM | POA: Diagnosis not present

## 2014-12-27 DIAGNOSIS — Z8711 Personal history of peptic ulcer disease: Secondary | ICD-10-CM | POA: Diagnosis not present

## 2014-12-27 DIAGNOSIS — G8929 Other chronic pain: Secondary | ICD-10-CM | POA: Diagnosis not present

## 2014-12-27 DIAGNOSIS — M25571 Pain in right ankle and joints of right foot: Secondary | ICD-10-CM | POA: Diagnosis not present

## 2014-12-27 DIAGNOSIS — E869 Volume depletion, unspecified: Secondary | ICD-10-CM | POA: Diagnosis not present

## 2014-12-27 DIAGNOSIS — R609 Edema, unspecified: Secondary | ICD-10-CM

## 2014-12-27 DIAGNOSIS — R778 Other specified abnormalities of plasma proteins: Secondary | ICD-10-CM | POA: Diagnosis present

## 2014-12-27 DIAGNOSIS — Z809 Family history of malignant neoplasm, unspecified: Secondary | ICD-10-CM | POA: Diagnosis not present

## 2014-12-27 DIAGNOSIS — R578 Other shock: Secondary | ICD-10-CM | POA: Diagnosis present

## 2014-12-27 DIAGNOSIS — R404 Transient alteration of awareness: Secondary | ICD-10-CM | POA: Diagnosis not present

## 2014-12-27 DIAGNOSIS — R4182 Altered mental status, unspecified: Secondary | ICD-10-CM | POA: Diagnosis not present

## 2014-12-27 DIAGNOSIS — R531 Weakness: Secondary | ICD-10-CM | POA: Diagnosis not present

## 2014-12-27 LAB — COMPREHENSIVE METABOLIC PANEL WITH GFR
ALT: 15 U/L (ref 0–35)
AST: 19 U/L (ref 0–37)
Albumin: 3.4 g/dL — ABNORMAL LOW (ref 3.5–5.2)
Alkaline Phosphatase: 56 U/L (ref 39–117)
Anion gap: 10 (ref 5–15)
BUN: 42 mg/dL — ABNORMAL HIGH (ref 6–23)
CO2: 29 mmol/L (ref 19–32)
Calcium: 9.8 mg/dL (ref 8.4–10.5)
Chloride: 103 mmol/L (ref 96–112)
Creatinine, Ser: 0.94 mg/dL (ref 0.50–1.10)
GFR calc Af Amer: 63 mL/min — ABNORMAL LOW
GFR calc non Af Amer: 55 mL/min — ABNORMAL LOW
Glucose, Bld: 109 mg/dL — ABNORMAL HIGH (ref 70–99)
Potassium: 3.4 mmol/L — ABNORMAL LOW (ref 3.5–5.1)
Sodium: 142 mmol/L (ref 135–145)
Total Bilirubin: 0.8 mg/dL (ref 0.3–1.2)
Total Protein: 5.9 g/dL — ABNORMAL LOW (ref 6.0–8.3)

## 2014-12-27 LAB — HEMOGLOBIN AND HEMATOCRIT, BLOOD
HCT: 26.8 % — ABNORMAL LOW (ref 36.0–46.0)
HCT: 26.5 % — ABNORMAL LOW (ref 36.0–46.0)
Hemoglobin: 8.7 g/dL — ABNORMAL LOW (ref 12.0–15.0)
Hemoglobin: 8.6 g/dL — ABNORMAL LOW (ref 12.0–15.0)

## 2014-12-27 LAB — APTT: APTT: 29 s (ref 24–37)

## 2014-12-27 LAB — CBC WITH DIFFERENTIAL/PLATELET
Basophils Absolute: 0 10*3/uL (ref 0.0–0.1)
Basophils Relative: 0 % (ref 0–1)
Eosinophils Absolute: 0 10*3/uL (ref 0.0–0.7)
Eosinophils Relative: 0 % (ref 0–5)
HCT: 18.7 % — ABNORMAL LOW (ref 36.0–46.0)
Hemoglobin: 5.9 g/dL — CL (ref 12.0–15.0)
Lymphocytes Relative: 20 % (ref 12–46)
Lymphs Abs: 1.5 10*3/uL (ref 0.7–4.0)
MCH: 29.6 pg (ref 26.0–34.0)
MCHC: 31.6 g/dL (ref 30.0–36.0)
MCV: 94 fL (ref 78.0–100.0)
Monocytes Absolute: 0.5 10*3/uL (ref 0.1–1.0)
Monocytes Relative: 7 % (ref 3–12)
Neutro Abs: 5.5 10*3/uL (ref 1.7–7.7)
Neutrophils Relative %: 73 % (ref 43–77)
Platelets: 279 10*3/uL (ref 150–400)
RBC: 1.99 MIL/uL — ABNORMAL LOW (ref 3.87–5.11)
RDW: 16.3 % — ABNORMAL HIGH (ref 11.5–15.5)
WBC: 7.5 10*3/uL (ref 4.0–10.5)

## 2014-12-27 LAB — I-STAT TROPONIN, ED: Troponin i, poc: 0.12 ng/mL (ref 0.00–0.08)

## 2014-12-27 LAB — POC OCCULT BLOOD, ED
Fecal Occult Bld: POSITIVE — AB
Fecal Occult Bld: POSITIVE — AB

## 2014-12-27 LAB — PREPARE RBC (CROSSMATCH)

## 2014-12-27 LAB — PROTIME-INR
INR: 1.27 (ref 0.00–1.49)
Prothrombin Time: 16.1 s — ABNORMAL HIGH (ref 11.6–15.2)

## 2014-12-27 MED ORDER — ACETAMINOPHEN 650 MG RE SUPP: 650.0000 mg | Freq: Four times a day (QID) | RECTAL | Status: DC | PRN

## 2014-12-27 MED ORDER — LORATADINE 10 MG PO TABS
10.0000 mg | ORAL_TABLET | Freq: Every day | ORAL | Status: DC
  Administered 2014-12-28 – 2015-01-01 (×5): 10 mg via ORAL
  Filled 2014-12-27 (×5): qty 1

## 2014-12-27 MED ORDER — SODIUM CHLORIDE 0.9 % IV SOLN: Freq: Once | INTRAVENOUS | Status: DC

## 2014-12-27 MED ORDER — MELATONIN 1 MG PO TABS: 1.0000 | ORAL_TABLET | Freq: Every day | ORAL | Status: DC

## 2014-12-27 MED ORDER — SODIUM CHLORIDE 0.9 % IV SOLN
8.0000 mg/h | INTRAVENOUS | Status: AC
  Administered 2014-12-27 – 2014-12-30 (×7): 8 mg/h via INTRAVENOUS
  Filled 2014-12-27 (×13): qty 80

## 2014-12-27 MED ORDER — ALBUTEROL SULFATE (2.5 MG/3ML) 0.083% IN NEBU: 2.5000 mg | INHALATION_SOLUTION | RESPIRATORY_TRACT | Status: DC | PRN

## 2014-12-27 MED ORDER — RISPERIDONE 1 MG PO TABS
1.0000 mg | ORAL_TABLET | Freq: Every day | ORAL | Status: DC
  Administered 2014-12-28 – 2014-12-31 (×5): 1 mg via ORAL
  Filled 2014-12-27 (×8): qty 1

## 2014-12-27 MED ORDER — SODIUM CHLORIDE 0.9 % IV SOLN
1000.0000 mL | INTRAVENOUS | Status: DC
  Administered 2014-12-27: 1000 mL via INTRAVENOUS

## 2014-12-27 MED ORDER — SODIUM CHLORIDE 0.9 % IV SOLN
80.0000 mg | Freq: Once | INTRAVENOUS | Status: AC
  Administered 2014-12-27: 80 mg via INTRAVENOUS
  Filled 2014-12-27: qty 80

## 2014-12-27 MED ORDER — ACETAMINOPHEN 325 MG PO TABS
650.0000 mg | ORAL_TABLET | Freq: Four times a day (QID) | ORAL | Status: DC | PRN
Start: 2014-12-27 — End: 2015-01-01
  Administered 2014-12-29 – 2014-12-31 (×3): 650 mg via ORAL
  Filled 2014-12-27 (×3): qty 2

## 2014-12-27 MED ORDER — SERTRALINE HCL 50 MG PO TABS
50.0000 mg | ORAL_TABLET | Freq: Every day | ORAL | Status: DC
  Administered 2014-12-28 – 2015-01-01 (×5): 50 mg via ORAL
  Filled 2014-12-27 (×5): qty 1

## 2014-12-27 MED ORDER — DONEPEZIL HCL 5 MG PO TABS
5.0000 mg | ORAL_TABLET | Freq: Every day | ORAL | Status: DC
  Administered 2014-12-28 – 2014-12-31 (×5): 5 mg via ORAL
  Filled 2014-12-27 (×7): qty 1

## 2014-12-27 MED ORDER — RISPERIDONE 0.5 MG PO TABS
0.5000 mg | ORAL_TABLET | Freq: Every day | ORAL | Status: DC
  Administered 2014-12-29 – 2015-01-01 (×4): 0.5 mg via ORAL
  Filled 2014-12-27 (×6): qty 1

## 2014-12-27 MED ORDER — ONDANSETRON HCL 4 MG/2ML IJ SOLN
4.0000 mg | Freq: Once | INTRAMUSCULAR | Status: AC
  Administered 2014-12-27: 4 mg via INTRAVENOUS
  Filled 2014-12-27: qty 2

## 2014-12-27 MED ORDER — PANTOPRAZOLE SODIUM 40 MG IV SOLR
40.0000 mg | Freq: Two times a day (BID) | INTRAVENOUS | Status: DC
  Administered 2014-12-31 – 2015-01-01 (×3): 40 mg via INTRAVENOUS
  Filled 2014-12-27 (×4): qty 40

## 2014-12-27 MED ORDER — POLYVINYL ALCOHOL 1.4 % OP SOLN
1.0000 [drp] | OPHTHALMIC | Status: DC | PRN
  Filled 2014-12-27: qty 15

## 2014-12-27 MED ORDER — ATORVASTATIN CALCIUM 20 MG PO TABS
20.0000 mg | ORAL_TABLET | Freq: Every day | ORAL | Status: DC
  Administered 2014-12-28 – 2014-12-31 (×5): 20 mg via ORAL
  Filled 2014-12-27 (×4): qty 1
  Filled 2014-12-27: qty 2
  Filled 2014-12-27 (×2): qty 1

## 2014-12-27 MED ORDER — VITAMIN D3 25 MCG (1000 UNIT) PO TABS
2000.0000 [IU] | ORAL_TABLET | Freq: Every day | ORAL | Status: DC
  Administered 2014-12-28 – 2015-01-01 (×5): 2000 [IU] via ORAL
  Filled 2014-12-27 (×6): qty 2

## 2014-12-27 MED ORDER — VITAMIN B-12 1000 MCG PO TABS
1000.0000 ug | ORAL_TABLET | Freq: Every day | ORAL | Status: DC
  Administered 2014-12-28 – 2015-01-01 (×5): 1000 ug via ORAL
  Filled 2014-12-27 (×5): qty 1

## 2014-12-27 MED ORDER — ADULT MULTIVITAMIN W/MINERALS CH
1.0000 | ORAL_TABLET | Freq: Every day | ORAL | Status: DC
  Administered 2014-12-28 – 2015-01-01 (×5): 1 via ORAL
  Filled 2014-12-27 (×5): qty 1

## 2014-12-27 MED ORDER — SODIUM CHLORIDE 0.9 % IV SOLN
1000.0000 mL | INTRAVENOUS | Status: DC
  Administered 2014-12-27 – 2014-12-28 (×2): 1000 mL via INTRAVENOUS

## 2014-12-27 NOTE — ED Notes (Signed)
Positive POC Troponin .12 reported to RN Charlann Boxer and Dr Hillard Danker

## 2014-12-27 NOTE — ED Provider Notes (Signed)
CSN: [id number]     Arrival date & time 12/27/14  1028 History  First MD Initiated Contact with Patient 12/27/14 1045     Chief Complaint  Patient presents with  . Weakness   HPI Patient presents to the emergency room with complaints of generalized weakness and inability to stand on her own this morning. [facility] lives at [facility]. She normally has no difficulty getting up and walking. She states when she went to get up this morning she was unable to stand. She felt weak all over. Patient continues to feel that way. She also feels a little bit cold. She denies any trouble with headache, chest pain, abdominal pain. She has not had any issues with shortness of breath. She denies any recent injuries. She denies any focal numbness or weakness. She has not had any vomiting or diarrhea. She has had a little bit of constipation over the last couple days. She denies any blood in her stool. Past Medical History  Diagnosis Date  . Hyperlipidemia   . Hypertension   . Obesity     5'3"  . Gastric ulcer 05/2011  . Chronic pain   . Anxiety   . Depression    Past Surgical History  Procedure Laterality Date  . Bilateral oophorectomy    . Skin biopsy      nose lesion, noncancerous  . Esophagogastroduodenoscopy  08/12/2012    Procedure: ESOPHAGOGASTRODUODENOSCOPY (EGD);  Surgeon: Arta Silence, MD;  Location: [facility];  Service: Endoscopy;  Laterality: Left;  . Abdominal hysterectomy      PARTIAL   Family History  Problem Relation Age of Onset  . Hypertension Mother   . Hyperlipidemia Mother   . Heart disease Father     57s  . Cancer Brother    History  Substance Use Topics  . Smoking status: Former Smoker -- 1.00 packs/day    Types: Cigarettes    Quit date: 09/02/1971  . Smokeless tobacco: Never Used  . Alcohol Use: Yes     Comment: occasionally   OB History    No data available     Review of Systems  All other systems reviewed and are  negative.     Allergies  Pollen extract  Home Medications   Prior to Admission medications   Medication Sig Start Date End Date Taking? Authorizing Provider  aspirin EC 81 MG tablet Take 81 mg by mouth daily with breakfast.   Yes Historical Provider, MD  atorvastatin (LIPITOR) 20 MG tablet Take 20 mg by mouth at bedtime.   Yes Historical Provider, MD  Calcium Carbonate-Vitamin D 600-400 MG-UNIT per tablet Take 1 tablet by mouth daily with breakfast. 10/26/14 10/26/15 Yes Historical Provider, MD  chlorthalidone (HYGROTON) 25 MG tablet Take 25 mg by mouth daily with breakfast.   Yes Historical Provider, MD  Cholecalciferol (VITAMIN D) 2000 UNITS tablet Take 2,000 Units by mouth daily.   Yes Historical Provider, MD  donepezil (ARICEPT) 5 MG tablet Take 1 tablet (5 mg total) by mouth at bedtime. 08/14/14  Yes Kennyth Arnold, FNP  fluticasone (FLONASE) 50 MCG/ACT nasal spray Place 2 sprays into both nostrils daily.   Yes Historical Provider, MD  gabapentin (NEURONTIN) 100 MG capsule Take 100 mg by mouth at bedtime.   Yes Historical Provider, MD  hydroxypropyl methylcellulose / hypromellose (ISOPTO TEARS / GONIOVISC) 2.5 % ophthalmic solution Place 1 drop into both eyes every 2 (two) hours as needed for dry eyes.   Yes Historical Provider, MD  lisinopril (PRINIVIL,ZESTRIL) 10 MG tablet Take 10 mg by mouth daily with breakfast.   Yes Historical Provider, MD  loratadine (CLARITIN) 10 MG tablet Take 10 mg by mouth daily.   Yes Historical Provider, MD  Melatonin 1 MG TABS Take 1 tablet by mouth at bedtime.   Yes Historical Provider, MD  Multiple Vitamins-Minerals (THERA-TABS M) TABS Take 1 tablet by mouth daily with breakfast.   Yes Historical Provider, MD  polyethylene glycol (MIRALAX / GLYCOLAX) packet Take 17 g by mouth daily.   Yes Historical Provider, MD  risperiDONE (RISPERDAL) 0.5 MG tablet Take 0.5 mg by mouth daily with breakfast.   Yes Historical Provider, MD  risperiDONE (RISPERDAL) 1 MG  tablet Take 1 tablet (1 mg total) by mouth at bedtime. 08/14/14  Yes Kennyth Arnold, FNP  sertraline (ZOLOFT) 50 MG tablet Take 50 mg by mouth daily.   Yes Historical Provider, MD  vitamin B-12 (CYANOCOBALAMIN) 500 MCG tablet Take 1,000 mcg by mouth daily.    Yes Historical Provider, MD  atenolol (TENORMIN) 25 MG tablet Take 0.5 tablets (12.5 mg total) by mouth daily. Patient not taking: Reported on 12/27/2014 08/14/14   Kennyth Arnold, FNP  bisoprolol-hydrochlorothiazide [facility]) 5-6.25 MG per tablet Take 1 tablet by mouth daily. Patient not taking: Reported on 12/27/2014 08/14/14   Kennyth Arnold, FNP   BP 80/33 mmHg  Pulse 92  Temp(Src) 98.2 F (36.8 C) (Oral)  Resp 20  SpO2 95% Physical Exam  Constitutional: She appears well-developed and well-nourished. No distress.  HENT:  Head: Normocephalic and atraumatic.  Right Ear: External ear normal.  Left Ear: External ear normal.  Mouth/Throat: Oropharynx is clear and moist.  Eyes: Conjunctivae are normal. Right eye exhibits no discharge. Left eye exhibits no discharge. No scleral icterus.  Conjunctiva appear pale  Neck: Neck supple. No tracheal deviation present.  Cardiovascular: Normal rate, regular rhythm and intact distal pulses.   Pulmonary/Chest: Effort normal and breath sounds normal. No stridor. No respiratory distress. She has no wheezes. She has no rales.  Abdominal: Soft. Bowel sounds are normal. She exhibits no distension. There is no tenderness. There is no rebound and no guarding.  Genitourinary:  Dark stool on rectal exam  Musculoskeletal: She exhibits no edema or tenderness.  Neurological: She is alert. No cranial nerve deficit (no facial droop, extraocular movements intact, no slurred speech) or sensory deficit. She exhibits normal muscle tone. She displays no seizure activity. Coordination normal.  Patient is able to move all 4 extremities but she does have general weakness, difficulty lifting her legs off the bed,  sensation intact in all extremities, no visual field cuts, no left or right sided neglect,  no nystagmus noted   Skin: Skin is warm and dry. No rash noted. She is not diaphoretic. There is pallor.  Psychiatric: She has a normal mood and affect.  Nursing note and vitals reviewed.   ED Course  Procedures (including critical care time) CRITICAL CARE Performed by: EZMOQ,HUT Total critical care time: 40 Critical care time was exclusive of separately billable procedures and treating other patients. Critical care was necessary to treat or prevent imminent or life-threatening deterioration. Critical care was time spent personally by me on the following activities: development of treatment plan with patient and/or surrogate as well as nursing, discussions with consultants, evaluation of patient's response to treatment, examination of patient, obtaining history from patient or surrogate, ordering and performing treatments and interventions, ordering and review of laboratory studies, ordering and review of radiographic  studies, pulse oximetry and re-evaluation of patient's condition.  Labs Review Labs Reviewed  CBC WITH DIFFERENTIAL/PLATELET - Abnormal; Notable for the following:    RBC 1.99 (*)    Hemoglobin 5.9 (*)    HCT 18.7 (*)    RDW 16.3 (*)    All other components within normal limits  COMPREHENSIVE METABOLIC PANEL - Abnormal; Notable for the following:    Potassium 3.4 (*)    Glucose, Bld 109 (*)    BUN 42 (*)    Total Protein 5.9 (*)    Albumin 3.4 (*)    GFR calc non Af Amer 55 (*)    GFR calc Af Amer 63 (*)    All other components within normal limits  PROTIME-INR - Abnormal; Notable for the following:    Prothrombin Time 16.1 (*)    All other components within normal limits  I-STAT TROPOININ, ED - Abnormal; Notable for the following:    Troponin i, poc 0.12 (*)    All other components within normal limits  POC OCCULT BLOOD, ED - Abnormal; Notable for the following:    Fecal  Occult Bld POSITIVE (*)    All other components within normal limits  APTT  TYPE AND SCREEN  PREPARE RBC (CROSSMATCH)    Imaging Review Ct Head Wo Contrast  12/27/2014   CLINICAL DATA:  Altered mental status and gait disturbance  EXAM: CT HEAD WITHOUT CONTRAST  TECHNIQUE: Contiguous axial images were obtained from the base of the skull through the vertex without intravenous contrast.  COMPARISON:  November 26, 2013  FINDINGS: There is mild diffuse atrophy. There is no intracranial mass, hemorrhage, extra-axial fluid collection, or midline shift. There is patchy small vessel disease in the centra semiovale bilaterally, stable. No new gray-white compartment lesions are identified. No acute infarct apparent. Bony calvarium appears intact. The mastoid air cells are clear. There is debris in each external auditory canal.  IMPRESSION: Atrophy with small vessel disease in the periventricular white matter bilaterally. No intracranial mass, hemorrhage, or acute appearing infarct. Probable cerumen in each external auditory canal.   Electronically Signed   By: Lowella Grip III M.D.   On: 12/27/2014 12:18   Dg Chest Portable 1 View  12/27/2014   CLINICAL DATA:  Generalize weakness.  EXAM: PORTABLE CHEST - 1 VIEW  COMPARISON:  09/29/2014  FINDINGS: The patient is mildly rotated to the right, partially limiting evaluation of the mediastinum. Cardiomediastinal silhouette is grossly unchanged with evidence of tortuosity and likely ectasia of the thoracic aorta. Moderate-sized hiatal hernia is again seen. No airspace consolidation, edema, pleural effusion, or pneumothorax is identified. No acute osseous abnormality is seen.  IMPRESSION: No active disease.   Electronically Signed   By: Logan Bores   On: 12/27/2014 12:14     EKG Interpretation None      MDM   Final diagnoses:  Gastrointestinal hemorrhage, unspecified gastritis, unspecified gastrointestinal hemorrhage type  Hemorrhagic shock    The patient  presents to the emergency room with hemorrhagic shock associated with upper GI bleeding. She is hypotensive, pale and anemic. Blood transfusions were ordered. The patient also has an elevated troponin and this may be related to demand ischemia.  I have consult with critical care.  They would prefer that the patient be resuscitated in the emergency room first with blood transfusions in order to determine if she needs to be in the ICU versus the hospitalist service.   I will also consult with the GI service.  Discussed  case with Dr Michail Sermon.  Pt has seen Dr Cristina Gong in the past.    1517  2nd unit is transfusing.  Pt remains hypotensive but without any complaints.  Additional units typed and crossed.  Continue to transfuse.  1544  BP ranging 90s to 100s.  Slight improvement.  Otherwise no complaints.  Discussed with Dr Lamonte Sakai who will have someone see in the ED.  Dorie Rank, MD 12/27/14 [phone]

## 2014-12-27 NOTE — Progress Notes (Signed)
Name: Jamie [name] MRN: [id number] DOB: 1929/10/10    ADMISSION DATE:  12/27/2014 CONSULTATION DATE:  12/27/14  REFERRING MD :  Dr. Tomi Bamberger   CHIEF COMPLAINT:  Hypotension   BRIEF PATIENT DESCRIPTION: 81 y/o F with PMH of gastric ulcers who presented to [facility] on 4/27 with hypotension and hemoglobin of 5.9. PCCM consulted for evaluation.  SIGNIFICANT EVENTS  4/27  Admit with GIB, Hgb 5.9  STUDIES:     HISTORY OF PRESENT ILLNESS:  81 y/o F, with PMH of obesity, hypertension, hyperlipidemia, chronic pain, anxiety, depression, dementia on Aricept, abdominal hysterectomy and gastric ulcers status post EGD in 2013 who presented to [facility] on 4/27 from a senior living facility [facility]) with weakness and inability to stand. At baseline she is functional in regards to walking/ADLs. Upon waking the a.m. of admission she felt weak and was unable to stand to get out of bed.  ER workup was notable for hemoglobin of 5.9.  Prior labs in January 2016 were notable for a hemoglobin of 15.9.  Patient reports she's had approximately 2 weeks of dark stools. She denies shortness of breath, chest pain, pain with inspiration, syncope/presyncope and abdominal pain.  Further labs notable for a mild elevation of troponin (0.12), potassium 3.4, serum creatinine 0.94 and platelets of 279.  Prior to admission medications reviewed and she is not on any anticoagulation. However she does take an 81 mg aspirin per day. She also is on lisinopril for hypertension at baseline.  PAST MEDICAL HISTORY :   has a past medical history of Hyperlipidemia; Hypertension; Obesity; Gastric ulcer (05/2011); Chronic pain; Anxiety; and Depression.  has past surgical history that includes Bilateral oophorectomy; skin biopsy; Esophagogastroduodenoscopy (08/12/2012); and Abdominal hysterectomy.    Prior to Admission medications   Medication Sig Start Date End Date Taking? Authorizing Provider  aspirin EC 81 MG  tablet Take 81 mg by mouth daily with breakfast.   Yes Historical Provider, MD  atorvastatin (LIPITOR) 20 MG tablet Take 20 mg by mouth at bedtime.   Yes Historical Provider, MD  Calcium Carbonate-Vitamin D 600-400 MG-UNIT per tablet Take 1 tablet by mouth daily with breakfast. 10/26/14 10/26/15 Yes Historical Provider, MD  chlorthalidone (HYGROTON) 25 MG tablet Take 25 mg by mouth daily with breakfast.   Yes Historical Provider, MD  Cholecalciferol (VITAMIN D) 2000 UNITS tablet Take 2,000 Units by mouth daily.   Yes Historical Provider, MD  donepezil (ARICEPT) 5 MG tablet Take 1 tablet (5 mg total) by mouth at bedtime. 08/14/14  Yes Kennyth Arnold, FNP  fluticasone (FLONASE) 50 MCG/ACT nasal spray Place 2 sprays into both nostrils daily.   Yes Historical Provider, MD  gabapentin (NEURONTIN) 100 MG capsule Take 100 mg by mouth at bedtime.   Yes Historical Provider, MD  hydroxypropyl methylcellulose / hypromellose (ISOPTO TEARS / GONIOVISC) 2.5 % ophthalmic solution Place 1 drop into both eyes every 2 (two) hours as needed for dry eyes.   Yes Historical Provider, MD  lisinopril (PRINIVIL,ZESTRIL) 10 MG tablet Take 10 mg by mouth daily with breakfast.   Yes Historical Provider, MD  loratadine (CLARITIN) 10 MG tablet Take 10 mg by mouth daily.   Yes Historical Provider, MD  Melatonin 1 MG TABS Take 1 tablet by mouth at bedtime.   Yes Historical Provider, MD  Multiple Vitamins-Minerals (THERA-TABS M) TABS Take 1 tablet by mouth daily with breakfast.   Yes Historical Provider, MD  polyethylene glycol (MIRALAX / GLYCOLAX) packet Take 17 g  by mouth daily.   Yes Historical Provider, MD  risperiDONE (RISPERDAL) 0.5 MG tablet Take 0.5 mg by mouth daily with breakfast.   Yes Historical Provider, MD  risperiDONE (RISPERDAL) 1 MG tablet Take 1 tablet (1 mg total) by mouth at bedtime. 08/14/14  Yes Kennyth Arnold, FNP  sertraline (ZOLOFT) 50 MG tablet Take 50 mg by mouth daily.   Yes Historical Provider, MD    vitamin B-12 (CYANOCOBALAMIN) 500 MCG tablet Take 1,000 mcg by mouth daily.    Yes Historical Provider, MD  atenolol (TENORMIN) 25 MG tablet Take 0.5 tablets (12.5 mg total) by mouth daily. Patient not taking: Reported on 12/27/2014 08/14/14   Kennyth Arnold, FNP  bisoprolol-hydrochlorothiazide [facility]) 5-6.25 MG per tablet Take 1 tablet by mouth daily. Patient not taking: Reported on 12/27/2014 08/14/14   Kennyth Arnold, FNP   Allergies  Allergen Reactions  . Pollen Extract Other (See Comments)    Swollen Eyes & Runny Nose.    FAMILY HISTORY:  family history includes Cancer in her brother; Heart disease in her father; Hyperlipidemia in her mother; Hypertension in her mother.   SOCIAL HISTORY:  reports that she quit smoking about 43 years ago. Her smoking use included Cigarettes. She smoked 1.00 pack per day. She has never used smokeless tobacco. She reports that she drinks alcohol. She reports that she does not use illicit drugs.  REVIEW OF SYSTEMS:   Constitutional: Negative for fever, chills, weight loss, malaise/fatigue and diaphoresis.  HENT: Negative for hearing loss, ear pain, nosebleeds, congestion, sore throat, neck pain, tinnitus and ear discharge.   Eyes: Negative for blurred vision, double vision, photophobia, pain, discharge and redness.  Respiratory: Negative for cough, hemoptysis, sputum production, shortness of breath, wheezing and stridor.   Cardiovascular: Negative for chest pain, palpitations, orthopnea, claudication, leg swelling and PND.  Gastrointestinal: Negative for heartburn, nausea, vomiting, abdominal pain, diarrhea, constipation, blood in stool.  Reports 2 weeks melena  Genitourinary: Negative for dysuria, urgency, frequency, hematuria and flank pain.  Musculoskeletal: Negative for myalgias, back pain, joint pain and falls.  Skin: Negative for itching and rash.  Neurological: Negative for dizziness, tingling, tremors, sensory change, speech change, focal  weakness, seizures, loss of consciousness, weakness and headaches.  Endo/Heme/Allergies: Negative for environmental allergies and polydipsia. Does not bruise/bleed easily.  SUBJECTIVE:   VITAL SIGNS: Temp:  [98.1 F (36.7 C)-98.2 F (36.8 C)] 98.2 F (36.8 C) (04/27 1539) Pulse Rate:  [87-96] 88 (04/27 1539) Resp:  [18-25] 20 (04/27 1539) BP: (72-99)/(28-64) 96/45 mmHg (04/27 1539) SpO2:  [95 %-100 %] 100 % (04/27 1539)  PHYSICAL EXAMINATION: General: wdwn elderly female in NAD Neuro: AAOx4, speech clear, MAE CV: s1s2 with SEM, regular, SR 70's on monitor PULM: resp's even/non-labored, lungs bilaterally clear GI: NTND, bsx4 active  Extremities: warm/dry, no edema    Recent Labs Lab 12/27/14 1141  NA 142  K 3.4*  CL 103  CO2 29  BUN 42*  CREATININE 0.94  GLUCOSE 109*    Recent Labs Lab 12/27/14 1141  HGB 5.9*  HCT 18.7*  WBC 7.5  PLT 279   Ct Head Wo Contrast  12/27/2014   CLINICAL DATA:  Altered mental status and gait disturbance  EXAM: CT HEAD WITHOUT CONTRAST  TECHNIQUE: Contiguous axial images were obtained from the base of the skull through the vertex without intravenous contrast.  COMPARISON:  November 26, 2013  FINDINGS: There is mild diffuse atrophy. There is no intracranial mass, hemorrhage, extra-axial fluid collection, or midline  shift. There is patchy small vessel disease in the centra semiovale bilaterally, stable. No new gray-white compartment lesions are identified. No acute infarct apparent. Bony calvarium appears intact. The mastoid air cells are clear. There is debris in each external auditory canal.  IMPRESSION: Atrophy with small vessel disease in the periventricular white matter bilaterally. No intracranial mass, hemorrhage, or acute appearing infarct. Probable cerumen in each external auditory canal.   Electronically Signed   By: Lowella Grip III M.D.   On: 12/27/2014 12:18   Dg Chest Portable 1 View  12/27/2014   CLINICAL DATA:  Generalize  weakness.  EXAM: PORTABLE CHEST - 1 VIEW  COMPARISON:  09/29/2014  FINDINGS: The patient is mildly rotated to the right, partially limiting evaluation of the mediastinum. Cardiomediastinal silhouette is grossly unchanged with evidence of tortuosity and likely ectasia of the thoracic aorta. Moderate-sized hiatal hernia is again seen. No airspace consolidation, edema, pleural effusion, or pneumothorax is identified. No acute osseous abnormality is seen.  IMPRESSION: No active disease.   Electronically Signed   By: Logan Bores   On: 12/27/2014 12:14    ASSESSMENT / PLAN:  Hypotension / Hemorrhagic Shock - in setting of volume depletion, s/p 2 units PRBC's with improvement in blood pressure, normal mentation Melena - hx of gastric ulcers in 2013, gastritis, 2 duodenal AVM's. Colonoscopy in 2012 with 2 small polyps Likely Upper GIB   Plan: Trend Hgb Q8 x 3 Admit per TRH, SDU monitoring  Tele, EKG in am  NS @ 125 ml/hr Defer further PRBC's to TRH  Protonix gtt Hold ASA  SCD's NPO  Plan for EGD in am per GI PRN zofran  Time spent with patient and reviewing data, 45 minutes.  PCCM will be available PRN.  Please call if new needs arise.    Noe Gens, NP-C [facility] Pgr: [phone] or [phone]   12/27/2014, 4:14 PM  Pt was evaluated by NP Alfredo Martinez and discussed with me. I discussed with Dr Waldron Labs who has kindly agreed to admit pt to [facility] service  Merton Border, MD ; [facility] [phone].  After 5:30 PM or weekends, call [phone]

## 2014-12-27 NOTE — ED Notes (Addendum)
POSITIVE POC OCCULT REPORTED TO RN Charlann Boxer AND TO DR Lenna Sciara. Tomi Bamberger

## 2014-12-27 NOTE — H&P (Signed)
Patient Demographics  Jamie [name], is a 81 y.o. female  MRN: [id number]   DOB - [birth year]  Admit Date - 12/27/2014  Outpatient Primary MD for the patient is Kennyth Arnold, FNP   With History of -  Past Medical History  Diagnosis Date  . Hyperlipidemia   . Hypertension   . Obesity     5'3"  . Gastric ulcer 05/2011  . Chronic pain   . Anxiety   . Depression       Past Surgical History  Procedure Laterality Date  . Bilateral oophorectomy    . Skin biopsy      nose lesion, noncancerous  . Esophagogastroduodenoscopy  08/12/2012    Procedure: ESOPHAGOGASTRODUODENOSCOPY (EGD);  Surgeon: Arta Silence, MD;  Location: [facility];  Service: Endoscopy;  Laterality: Left;  . Abdominal hysterectomy      PARTIAL    in for   Chief Complaint  Patient presents with  . Weakness     HPI  Jamie [name]  is a 81 y.o. female, with past medical history of obesity, hypertension, hyperlipidemia, chronic pain, depression, dementia, anxiety, patient presents with weakness, for the last few days, patient was noticed to be hypotensive in ED, with systolic blood pressure in the 70s and 80s, workup was significant for anemia with hemoglobin of 5.9, his recent hemoglobin in January 2016 was 15.9, patient was noticed to have melena on physical exam by ED physician, and reports having melena over the last 2 weeks, had EGD in 2013, showed Cameron's erosions; gastritis and 2 duodenal AVMs. Colonoscopy in 2012 showed 2 small polyps and diverticulosis,EGD in 2012 showed a gastric ulcer, patient responded to volume resuscitation, she received 2 units packed red blood cells, most recent blood pressure 104/64, patient denies any NSAIDs use, had mildly elevated troponins at 0.12, denies any chest pain or shortness of breath, but this requested to admit the patient for further management for GI bleed.    Review of Systems    In addition to the HPI above,  No Fever-chills, No Headache, No changes with  Vision or hearing, No problems swallowing food or Liquids, No Chest pain, Cough or Shortness of Breath, No Abdominal pain, No Nausea or Vommitting,  No Blood in  Urine, reports melena for last 2 weeks. No dysuria, No new skin rashes or bruises, No new joints pains-aches,  No new weakness, tingling, numbness in any extremity, No recent weight gain or loss, No polyuria, polydypsia or polyphagia, No significant Mental Stressors.  A full 10 point Review of Systems was done, except as stated above, all other Review of Systems were negative.   Social History History  Substance Use Topics  . Smoking status: Former Smoker -- 1.00 packs/day    Types: Cigarettes    Quit date: 09/02/1971  . Smokeless tobacco: Never Used  . Alcohol Use: Yes     Comment: occasionally     Family History Family History  Problem Relation Age of Onset  . Hypertension Mother   . Hyperlipidemia Mother   . Heart disease Father     78s  . Cancer Brother      Prior to Admission medications   Medication Sig Start Date End Date Taking? Authorizing Provider  aspirin EC 81 MG tablet Take 81 mg by mouth daily with breakfast.   Yes Historical Provider, MD  atorvastatin (LIPITOR) 20 MG tablet Take 20 mg by mouth at bedtime.   Yes Historical Provider, MD  Calcium Carbonate-Vitamin D 600-400  MG-UNIT per tablet Take 1 tablet by mouth daily with breakfast. 10/26/14 10/26/15 Yes Historical Provider, MD  chlorthalidone (HYGROTON) 25 MG tablet Take 25 mg by mouth daily with breakfast.   Yes Historical Provider, MD  Cholecalciferol (VITAMIN D) 2000 UNITS tablet Take 2,000 Units by mouth daily.   Yes Historical Provider, MD  donepezil (ARICEPT) 5 MG tablet Take 1 tablet (5 mg total) by mouth at bedtime. 08/14/14  Yes Kennyth Arnold, FNP  fluticasone (FLONASE) 50 MCG/ACT nasal spray Place 2 sprays into both nostrils daily.   Yes Historical Provider, MD  gabapentin (NEURONTIN) 100 MG capsule Take 100 mg by mouth at bedtime.    Yes Historical Provider, MD  hydroxypropyl methylcellulose / hypromellose (ISOPTO TEARS / GONIOVISC) 2.5 % ophthalmic solution Place 1 drop into both eyes every 2 (two) hours as needed for dry eyes.   Yes Historical Provider, MD  lisinopril (PRINIVIL,ZESTRIL) 10 MG tablet Take 10 mg by mouth daily with breakfast.   Yes Historical Provider, MD  loratadine (CLARITIN) 10 MG tablet Take 10 mg by mouth daily.   Yes Historical Provider, MD  Melatonin 1 MG TABS Take 1 tablet by mouth at bedtime.   Yes Historical Provider, MD  Multiple Vitamins-Minerals (THERA-TABS M) TABS Take 1 tablet by mouth daily with breakfast.   Yes Historical Provider, MD  polyethylene glycol (MIRALAX / GLYCOLAX) packet Take 17 g by mouth daily.   Yes Historical Provider, MD  risperiDONE (RISPERDAL) 0.5 MG tablet Take 0.5 mg by mouth daily with breakfast.   Yes Historical Provider, MD  risperiDONE (RISPERDAL) 1 MG tablet Take 1 tablet (1 mg total) by mouth at bedtime. 08/14/14  Yes Kennyth Arnold, FNP  sertraline (ZOLOFT) 50 MG tablet Take 50 mg by mouth daily.   Yes Historical Provider, MD  vitamin B-12 (CYANOCOBALAMIN) 500 MCG tablet Take 1,000 mcg by mouth daily.    Yes Historical Provider, MD    Allergies  Allergen Reactions  . Pollen Extract Other (See Comments)    Swollen Eyes & Runny Nose.    Physical Exam  Vitals  Blood pressure 104/64, pulse 81, temperature 98.2 F (36.8 C), temperature source Oral, resp. rate 18, SpO2 98 %.   1. General frail elderly female lying in bed in NAD,   2. Normal affect and insight, Awake Alert, Oriented .  3. No F.N deficits, ALL C.Nerves Intact, moving all extremities with no significant deficits  4. Ears and Eyes appear Normal, Conjunctivae clear, PERRLA. Moist Oral Mucosa.  5. Supple Neck, No JVD, No cervical lymphadenopathy appriciated, No Carotid Bruits.  6. Symmetrical Chest wall movement, Good air movement bilaterally, CTAB.  7. RRR, No Gallops, Rubs or Murmurs, No  Parasternal Heave.  8. Positive Bowel Sounds, Abdomen Soft, No tenderness, No organomegaly appriciated,No rebound -guarding or rigidity.  9.  No Cyanosis, Normal Skin Turgor, No Skin Rash or Bruise.  10. Good muscle tone,  joints appear normal , no effusions, Normal ROM.  11. No Palpable Lymph Nodes in Neck or Axillae    Data Review  CBC  Recent Labs Lab 12/27/14 1141  WBC 7.5  HGB 5.9*  HCT 18.7*  PLT 279  MCV 94.0  MCH 29.6  MCHC 31.6  RDW 16.3*  LYMPHSABS 1.5  MONOABS 0.5  EOSABS 0.0  BASOSABS 0.0   ------------------------------------------------------------------------------------------------------------------  Chemistries   Recent Labs Lab 12/27/14 1141  NA 142  K 3.4*  CL 103  CO2 29  GLUCOSE 109*  BUN 42*  CREATININE 0.94  CALCIUM 9.8  AST 19  ALT 15  ALKPHOS 56  BILITOT 0.8   ------------------------------------------------------------------------------------------------------------------ CrCl cannot be calculated (Unknown ideal weight.). ------------------------------------------------------------------------------------------------------------------ No results for input(s): TSH, T4TOTAL, T3FREE, THYROIDAB in the last 72 hours.  Invalid input(s): FREET3   Coagulation profile  Recent Labs Lab 12/27/14 1141  INR 1.27   ------------------------------------------------------------------------------------------------------------------- No results for input(s): DDIMER in the last 72 hours. -------------------------------------------------------------------------------------------------------------------  Cardiac Enzymes No results for input(s): CKMB, TROPONINI, MYOGLOBIN in the last 168 hours.  Invalid input(s): CK ------------------------------------------------------------------------------------------------------------------ Invalid input(s):  POCBNP   ---------------------------------------------------------------------------------------------------------------  Urinalysis    Component Value Date/Time   COLORURINE YELLOW 09/28/2014 2301   APPEARANCEUR CLOUDY* 09/28/2014 2301   LABSPEC 1.019 09/28/2014 2301   PHURINE 7.0 09/28/2014 2301   GLUCOSEU NEGATIVE 09/28/2014 2301   HGBUR MODERATE* 09/28/2014 2301   HGBUR negative 05/15/2010 0747   BILIRUBINUR SMALL* 09/28/2014 2301   BILIRUBINUR n 02/14/2014 1434   KETONESUR NEGATIVE 09/28/2014 2301   PROTEINUR 30* 09/28/2014 2301   PROTEINUR n 02/14/2014 1434   UROBILINOGEN 1.0 09/28/2014 2301   UROBILINOGEN 0.2 02/14/2014 1434   NITRITE NEGATIVE 09/28/2014 2301   NITRITE n 02/14/2014 1434   LEUKOCYTESUR SMALL* 09/28/2014 2301    ----------------------------------------------------------------------------------------------------------------  Imaging results:   Ct Head Wo Contrast  12/27/2014   CLINICAL DATA:  Altered mental status and gait disturbance  EXAM: CT HEAD WITHOUT CONTRAST  TECHNIQUE: Contiguous axial images were obtained from the base of the skull through the vertex without intravenous contrast.  COMPARISON:  November 26, 2013  FINDINGS: There is mild diffuse atrophy. There is no intracranial mass, hemorrhage, extra-axial fluid collection, or midline shift. There is patchy small vessel disease in the centra semiovale bilaterally, stable. No new gray-white compartment lesions are identified. No acute infarct apparent. Bony calvarium appears intact. The mastoid air cells are clear. There is debris in each external auditory canal.  IMPRESSION: Atrophy with small vessel disease in the periventricular white matter bilaterally. No intracranial mass, hemorrhage, or acute appearing infarct. Probable cerumen in each external auditory canal.   Electronically Signed   By: Lowella Grip III M.D.   On: 12/27/2014 12:18   Dg Chest Portable 1 View  12/27/2014   CLINICAL DATA:   Generalize weakness.  EXAM: PORTABLE CHEST - 1 VIEW  COMPARISON:  09/29/2014  FINDINGS: The patient is mildly rotated to the right, partially limiting evaluation of the mediastinum. Cardiomediastinal silhouette is grossly unchanged with evidence of tortuosity and likely ectasia of the thoracic aorta. Moderate-sized hiatal hernia is again seen. No airspace consolidation, edema, pleural effusion, or pneumothorax is identified. No acute osseous abnormality is seen.  IMPRESSION: No active disease.   Electronically Signed   By: Logan Bores   On: 12/27/2014 12:14        Assessment & Plan  Active Problems:   Hyperlipemia   Essential hypertension   Dementia with behavioral disturbance   Hemorrhagic shock   GI bleed   Anemia due to GI bleed - She presented with hemoglobin of 5.9, baseline 15.9, initially hypotensive, responded to volume resuscitation. - EGD in 2013, showed Cameron's erosions; gastritis and 2 duodenal AVMs. Colonoscopy in 2012 showed 2 small polyps and diverticulosis,EGD in 2012 showed a gastric ulcer. - Gastroenterology input greatly appreciated, will start on Protonix drip, keep nothing by mouth for possible endoscopy in a.m.Marland Kitchen - Already received 2 units and partial blood cell, will transfuse another unit, will monitor H&H every 8 hours. - Denies any NSAID use, continue to hold aspirin.  Elevated troponins. - Denies any chest pain or shortness of breath, this is most likely in the setting of demand ischemia, continue to cycle cardiac enzymes and follow the trend, monitor on telemetry. - Will stop aspirin given GI bleed.  Hypertension - Blood per responded to volume, continue to hold all antihypertensive medication.  Hyperlipidemia - Continue with statin  History of dementia with behavioral disturbances - Resume home medication   DVT Prophylaxis  SCDs (given GI bleed)  AM Labs Ordered, also please review Full Orders  Family Communication: Admission, patients  condition and plan of care including tests being ordered have been discussed with the patient and her friend at bedside  , at bed who indicate understanding and agree with the plan and Code Status.  Code Status Full , patient has no family in [place], her family or in [place], Leanna Sato is her previous [phone] , currently [facility] is her guardianLeanna Sato will call the floor with the contact information at the [place])  admit to stepdown  Condition GUARDED    Time spent in minutes : 55 minutes    Breanah Faddis M.D on 12/27/2014 at 4:55 PM  Between 7am to 7pm - Pager - [phone]  After 7pm go to [email or web address] - password [email or web address]  And look for the night coverage person covering me after hours  [facility] Office  [phone]   **Disclaimer: This note may have been dictated with voice recognition software. Similar sounding words can inadvertently be transcribed and this note may contain transcription errors which may not have been corrected upon publication of note.**

## 2014-12-27 NOTE — Clinical Social Work Note (Signed)
Clinical Social Work Assessment  Patient Details  Name: Jamie [name] MRN: [id number] Date of Birth: [birth year]  Date of referral:  12/27/14               Reason for consult:   (Patient from facility, [facility].)                Permission sought to share information with:   (None.) Permission granted to share information::  No  Name::        Agency::     Relationship::     Contact Information:     Housing/Transportation Living arrangements for the past 2 months:  [place] ([facility]. Patient states she has been living there for the past 2 months.) Source of Information:  Patient Patient Interpreter Needed:  None Criminal Activity/Legal Involvement Pertinent to Current Situation/Hospitalization:  No - Comment as needed Significant Relationships:  Friend (Patient informed CSW that her past neightbor/ friend is her primary support at this time.) Lives with:  Facility Resident Do you feel safe going back to the place where you live?  Yes Need for family participation in patient care:   (Patient informed CSW that she does not have family support. However, she states her past neighbor has been a great support. The pt is currently receiving her support from her current ALF. )  Care giving concerns:  The pt is currently receiving the appropriate care. She is living at [facility].    Social Worker assessment / plan:  CSW met with pt at bedside. There was no family present. Patient confirms that she presents to [facility] due to generalized weakness. Nurse also states that the pt also has a GI bleed. Also, she confirms that she comes from [facility]. Patient states that she has been living there for the past 2 months.  Patient informed CSW that she does not fall often. She states that she has not fallen in the past 6 months. Patient informed CSW that she is in the independent living unit at the facility. She states that she does not receive any assistance with completing her  ADL's.   Patient informed CSW that her former neighbor and friend Suan Halter is her primary support.   Patient states that she does not have any questions at this time.    Employment status:  Retired Forensic scientist:   Production designer, theatre/television/film.) PT Recommendations:   (The pt does not currently need any recommendations from [facility]. ) Information / Referral to community resources:   (The pt is currently lving at [facility].)  Patient/Family's Response to care:  Patient is appropriate. She is aware that she presents to [facility] due to generalized weakness.  Patient/Family's Understanding of and Emotional Response to Diagnosis, Current Treatment, and Prognosis:  Patient is understanding and is aware that she presents to [facility] due to generalized. Patient did not express any concerns and states that she does not have any questions at this time.  Emotional Assessment Appearance:  Appears stated age Attitude/Demeanor/Rapport:   (Well Mannered.) Affect (typically observed):  Appropriate Orientation:  Oriented to Self, Oriented to Place, Oriented to  Time, Oriented to Situation Alcohol / Substance use:    Psych involvement (Current and /or in the community):  No (Comment)  Discharge Needs  Concerns to be addressed:  Adjustment to Illness Readmission within the last 30 days:  No Current discharge risk:  None Barriers to Discharge:  No Barriers Identified   Bernita Buffy, LCSW 12/27/2014,  10:27 PM  

## 2014-12-27 NOTE — ED Notes (Signed)
Vesta is listed as her guardian until someone is Marine scientist. Merdis Delay is the contact person for [facility].

## 2014-12-27 NOTE — Consult Note (Addendum)
Referring Provider: Dr. Tomi Bamberger Primary Care Physician:  Kennyth Arnold, FNP Primary Gastroenterologist:  Dr. Cristina Gong  Reason for Consultation:  GI bleed; Anemia  HPI: Jamie [name] is a 81 y.o. female with a history of a gastric ulcer in 2012 presents with onset of weakness and dizziness yesterday without any visible GI bleeding. Denies N/V/melena/hematochezia/hematemesis. Denies abdominal pain. Dark stool noted on rectal exam by Dr. Tomi Bamberger. Patient is from a nursing facility. She is unable to give me any additional history. Hypotensive in ER 80's/60's during my evaluation. HR in th 80's - 90's. Hgb 5.9 (15.9 in January 2016). 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 Colonoscopy in 2012 showed 2 small polyps and diverticulosis. EGD in 2012 showed a gastric ulcer. On Aspirin but denies other NSAIDs.    Past Medical History  Diagnosis Date  . Hyperlipidemia   . Hypertension   . Obesity     5'3"  . Gastric ulcer 05/2011  . Chronic pain   . Anxiety   . Depression     Past Surgical History  Procedure Laterality Date  . Bilateral oophorectomy    . Skin biopsy      nose lesion, noncancerous  . Esophagogastroduodenoscopy  08/12/2012    Procedure: ESOPHAGOGASTRODUODENOSCOPY (EGD);  Surgeon: Arta Silence, MD;  Location: [facility];  Service: Endoscopy;  Laterality: Left;  . Abdominal hysterectomy      PARTIAL    Prior to Admission medications   Medication Sig Start Date End Date Taking? Authorizing Provider  aspirin EC 81 MG tablet Take 81 mg by mouth daily with breakfast.   Yes Historical Provider, MD  atorvastatin (LIPITOR) 20 MG tablet Take 20 mg by mouth at bedtime.   Yes Historical Provider, MD  Calcium Carbonate-Vitamin D 600-400 MG-UNIT per tablet Take 1 tablet by mouth daily with breakfast. 10/26/14 10/26/15 Yes Historical Provider, MD  chlorthalidone (HYGROTON) 25 MG tablet Take 25 mg by mouth daily with breakfast.   Yes Historical  Provider, MD  Cholecalciferol (VITAMIN D) 2000 UNITS tablet Take 2,000 Units by mouth daily.   Yes Historical Provider, MD  donepezil (ARICEPT) 5 MG tablet Take 1 tablet (5 mg total) by mouth at bedtime. 08/14/14  Yes Kennyth Arnold, FNP  fluticasone (FLONASE) 50 MCG/ACT nasal spray Place 2 sprays into both nostrils daily.   Yes Historical Provider, MD  gabapentin (NEURONTIN) 100 MG capsule Take 100 mg by mouth at bedtime.   Yes Historical Provider, MD  hydroxypropyl methylcellulose / hypromellose (ISOPTO TEARS / GONIOVISC) 2.5 % ophthalmic solution Place 1 drop into both eyes every 2 (two) hours as needed for dry eyes.   Yes Historical Provider, MD  lisinopril (PRINIVIL,ZESTRIL) 10 MG tablet Take 10 mg by mouth daily with breakfast.   Yes Historical Provider, MD  loratadine (CLARITIN) 10 MG tablet Take 10 mg by mouth daily.   Yes Historical Provider, MD  Melatonin 1 MG TABS Take 1 tablet by mouth at bedtime.   Yes Historical Provider, MD  Multiple Vitamins-Minerals (THERA-TABS M) TABS Take 1 tablet by mouth daily with breakfast.   Yes Historical Provider, MD  polyethylene glycol (MIRALAX / GLYCOLAX) packet Take 17 g by mouth daily.   Yes Historical Provider, MD  risperiDONE (RISPERDAL) 0.5 MG tablet Take 0.5 mg by mouth daily with breakfast.   Yes Historical Provider, MD  risperiDONE (RISPERDAL) 1 MG tablet Take 1 tablet (1 mg total) by mouth at bedtime. 08/14/14  Yes Padonda B  Justin Mend, FNP  sertraline (ZOLOFT) 50 MG tablet Take 50 mg by mouth daily.   Yes Historical Provider, MD  vitamin B-12 (CYANOCOBALAMIN) 500 MCG tablet Take 1,000 mcg by mouth daily.    Yes Historical Provider, MD  atenolol (TENORMIN) 25 MG tablet Take 0.5 tablets (12.5 mg total) by mouth daily. Patient not taking: Reported on 12/27/2014 08/14/14   Kennyth Arnold, FNP  bisoprolol-hydrochlorothiazide [facility]) 5-6.25 MG per tablet Take 1 tablet by mouth daily. Patient not taking: Reported on 12/27/2014 08/14/14   Kennyth Arnold, FNP     Scheduled Meds:  Continuous Infusions: . sodium chloride 1,000 mL (12/27/14 1219)  . sodium chloride     PRN Meds:.  Allergies as of 12/27/2014 - Review Complete 12/27/2014  Allergen Reaction Noted  . Pollen extract Other (See Comments) 07/24/2014    Family History  Problem Relation Age of Onset  . Hypertension Mother   . Hyperlipidemia Mother   . Heart disease Father     7s  . Cancer Brother     History   Social History  . Marital Status: Divorced    Spouse Name: N/A  . Number of Children: N/A  . Years of Education: N/A   Occupational History  . Not on file.   Social History Main Topics  . Smoking status: Former Smoker -- 1.00 packs/day    Types: Cigarettes    Quit date: 09/02/1971  . Smokeless tobacco: Never Used  . Alcohol Use: Yes     Comment: occasionally  . Drug Use: No  . Sexual Activity: Not Currently   Other Topics Concern  . Not on file   Social History Narrative   Lives in a townhouse with steps.  Lives alone.  Ambulates without assist device.  Drives, pays own bills, and completes ADLs without assistance.  Pt. Current/past profession-Concierge. Pt. Does exercise (walk) 1 X Day. Pt. Does have a living will. Pt. Do have a DNR form. And also Pt. Have signed POA/HPOA forms.      Neighbor, [name], is her emergency contact:  [phone]   Full code                   Review of Systems: All negative from GI standpoint except as stated above in HPI; patient unsure on other systems  Physical Exam: Vital signs: Filed Vitals:   12/27/14 1539  BP: 96/45  Pulse: 88  Temp: 98.2 F (36.8 C)  Resp: 20     General:   Lethargic, pale, elderly, obese  Head: normocephalic EENT: anicteric, oropharynx clear Neck: supple, nontender Lungs:  Clear throughout to auscultation.   No wheezes, crackles, or rhonchi. No acute distress. Heart:  Regular rate and rhythm; no murmurs, clicks, rubs,  or gallops. Abdomen: soft, nontender, nondistended, +BS  Rectal:   Deferred Ext: no edema Skin: pallor  GI:  Lab Results:  Recent Labs  12/27/14 1141  WBC 7.5  HGB 5.9*  HCT 18.7*  PLT 279   BMET  Recent Labs  12/27/14 1141  NA 142  K 3.4*  CL 103  CO2 29  GLUCOSE 109*  BUN 42*  CREATININE 0.94  CALCIUM 9.8   LFT  Recent Labs  12/27/14 1141  PROT 5.9*  ALBUMIN 3.4*  AST 19  ALT 15  ALKPHOS 56  BILITOT 0.8   PT/INR  Recent Labs  12/27/14 1141  LABPROT 16.1*  INR 1.27     Studies/Results: Ct Head Wo Contrast  12/27/2014   CLINICAL DATA:  Altered mental status and gait disturbance  EXAM: CT HEAD WITHOUT CONTRAST  TECHNIQUE: Contiguous axial images were obtained from the base of the skull through the vertex without intravenous contrast.  COMPARISON:  November 26, 2013  FINDINGS: There is mild diffuse atrophy. There is no intracranial mass, hemorrhage, extra-axial fluid collection, or midline shift. There is patchy small vessel disease in the centra semiovale bilaterally, stable. No new gray-white compartment lesions are identified. No acute infarct apparent. Bony calvarium appears intact. The mastoid air cells are clear. There is debris in each external auditory canal.  IMPRESSION: Atrophy with small vessel disease in the periventricular white matter bilaterally. No intracranial mass, hemorrhage, or acute appearing infarct. Probable cerumen in each external auditory canal.   Electronically Signed   By: Lowella Grip III M.D.   On: 12/27/2014 12:18   Dg Chest Portable 1 View  12/27/2014   CLINICAL DATA:  Generalize weakness.  EXAM: PORTABLE CHEST - 1 VIEW  COMPARISON:  09/29/2014  FINDINGS: The patient is mildly rotated to the right, partially limiting evaluation of the mediastinum. Cardiomediastinal silhouette is grossly unchanged with evidence of tortuosity and likely ectasia of the thoracic aorta. Moderate-sized hiatal hernia is again seen. No airspace consolidation, edema, pleural effusion, or pneumothorax is identified. No  acute osseous abnormality is seen.  IMPRESSION: No active disease.   Electronically Signed   By: Logan Bores   On: 12/27/2014 12:14    Impression/Plan: 81 yo with signs of a GI bleed with melena on rectal exam likely due to an upper tract source such as a peptic ulcer vs. AVMs. No abdominal pain or hematemesis. Aggressive volume resuscitation with fluids and blood. Protonix drip. EGD in AM to look for a peptic ulcer source. Keep NPO.       [place] C.  12/27/2014, 4:12 PM

## 2014-12-27 NOTE — ED Notes (Signed)
Patient comes from [facility] senior living. Staff states patient has generalize weakness and was unable to stand independently this morning.

## 2014-12-28 ENCOUNTER — Inpatient Hospital Stay (HOSPITAL_COMMUNITY): Payer: Medicare Other | Admitting: Anesthesiology

## 2014-12-28 ENCOUNTER — Other Ambulatory Visit (HOSPITAL_COMMUNITY): Payer: Self-pay

## 2014-12-28 ENCOUNTER — Encounter (HOSPITAL_COMMUNITY): Payer: Self-pay | Admitting: Anesthesiology

## 2014-12-28 ENCOUNTER — Encounter (HOSPITAL_COMMUNITY)
Admission: EM | Disposition: A | Discharge status: Home Health | Payer: Self-pay | Source: Home / Self Care | Attending: Internal Medicine

## 2014-12-28 DIAGNOSIS — K921 Melena: Secondary | ICD-10-CM

## 2014-12-28 HISTORY — PX: ESOPHAGOGASTRODUODENOSCOPY (EGD) WITH PROPOFOL: SHX5813

## 2014-12-28 LAB — MRSA PCR SCREENING: MRSA by PCR: NEGATIVE

## 2014-12-28 LAB — TROPONIN I
TROPONIN I: 0.12 ng/mL — AB (ref <0.031)
Troponin I: 0.21 ng/mL — ABNORMAL HIGH (ref <0.031)

## 2014-12-28 LAB — I-STAT CG4 LACTIC ACID, ED
Lactic Acid, Venous: 0.38 mmol/L — ABNORMAL LOW (ref 0.5–2.0)
Lactic Acid, Venous: 0.44 mmol/L — ABNORMAL LOW (ref 0.5–2.0)

## 2014-12-28 LAB — HEMOGLOBIN AND HEMATOCRIT, BLOOD
HCT: 26.4 % — ABNORMAL LOW (ref 36.0–46.0)
Hemoglobin: 8.6 g/dL — ABNORMAL LOW (ref 12.0–15.0)

## 2014-12-28 LAB — PREPARE RBC (CROSSMATCH)

## 2014-12-28 SURGERY — ESOPHAGOGASTRODUODENOSCOPY (EGD) WITH PROPOFOL
Anesthesia: Monitor Anesthesia Care

## 2014-12-28 MED ORDER — LIDOCAINE HCL 1 % IJ SOLN
INTRAMUSCULAR | Status: DC | PRN
  Administered 2014-12-28: 40 mg via INTRADERMAL

## 2014-12-28 MED ORDER — KETAMINE HCL 10 MG/ML IJ SOLN
INTRAMUSCULAR | Status: AC
  Filled 2014-12-28: qty 1

## 2014-12-28 MED ORDER — SODIUM CHLORIDE 0.9 % IV SOLN
1000.0000 mL | INTRAVENOUS | Status: DC
  Administered 2014-12-29 (×2): 1000 mL via INTRAVENOUS

## 2014-12-28 MED ORDER — PROPOFOL INFUSION 10 MG/ML OPTIME
INTRAVENOUS | Status: DC | PRN
  Administered 2014-12-28: 120 ug/kg/min via INTRAVENOUS

## 2014-12-28 MED ORDER — LACTATED RINGERS IV SOLN
INTRAVENOUS | Status: DC | PRN
  Administered 2014-12-28: 08:00:00 via INTRAVENOUS

## 2014-12-28 MED ORDER — LACTATED RINGERS IV SOLN
INTRAVENOUS | Status: DC
  Administered 2014-12-28: 1000 mL via INTRAVENOUS

## 2014-12-28 MED ORDER — PROPOFOL 10 MG/ML IV BOLUS
INTRAVENOUS | Status: AC
  Filled 2014-12-28: qty 20

## 2014-12-28 MED ORDER — ALPRAZOLAM 0.25 MG PO TABS
0.2500 mg | ORAL_TABLET | Freq: Once | ORAL | Status: AC
  Administered 2014-12-28: 0.25 mg via ORAL
  Filled 2014-12-28: qty 1

## 2014-12-28 MED ORDER — BUTAMBEN-TETRACAINE-BENZOCAINE 2-2-14 % EX AERO
INHALATION_SPRAY | CUTANEOUS | Status: DC | PRN
  Administered 2014-12-28: 1 via TOPICAL

## 2014-12-28 MED ORDER — PROMETHAZINE HCL 25 MG/ML IJ SOLN: 6.2500 mg | INTRAMUSCULAR | Status: DC | PRN

## 2014-12-28 MED ORDER — LIDOCAINE HCL (CARDIAC) 20 MG/ML IV SOLN
INTRAVENOUS | Status: AC
  Filled 2014-12-28: qty 5

## 2014-12-28 MED ORDER — MEPERIDINE HCL 25 MG/ML IJ SOLN: 6.2500 mg | INTRAMUSCULAR | Status: DC | PRN

## 2014-12-28 SURGICAL SUPPLY — 15 items

## 2014-12-28 NOTE — H&P (View-Only) (Signed)
Referring Provider: Dr. Tomi Bamberger Primary Care Physician:  Kennyth Arnold, FNP Primary Gastroenterologist:  Dr. Cristina Gong  Reason for Consultation:  GI bleed; Anemia  HPI: Jamie [name] is a 81 y.o. female with a history of a gastric ulcer in 2012 presents with onset of weakness and dizziness yesterday without any visible GI bleeding. Denies N/V/melena/hematochezia/hematemesis. Denies abdominal pain. Dark stool noted on rectal exam by Dr. Tomi Bamberger. Patient is from a nursing facility. She is unable to give me any additional history. Hypotensive in ER 80's/60's during my evaluation. HR in th 80's - 90's. Hgb 5.9 (15.9 in January 2016). 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 Colonoscopy in 2012 showed 2 small polyps and diverticulosis. EGD in 2012 showed a gastric ulcer. On Aspirin but denies other NSAIDs.    Past Medical History  Diagnosis Date  . Hyperlipidemia   . Hypertension   . Obesity     5'3"  . Gastric ulcer 05/2011  . Chronic pain   . Anxiety   . Depression     Past Surgical History  Procedure Laterality Date  . Bilateral oophorectomy    . Skin biopsy      nose lesion, noncancerous  . Esophagogastroduodenoscopy  08/12/2012    Procedure: ESOPHAGOGASTRODUODENOSCOPY (EGD);  Surgeon: Arta Silence, MD;  Location: [facility];  Service: Endoscopy;  Laterality: Left;  . Abdominal hysterectomy      PARTIAL    Prior to Admission medications   Medication Sig Start Date End Date Taking? Authorizing Provider  aspirin EC 81 MG tablet Take 81 mg by mouth daily with breakfast.   Yes Historical Provider, MD  atorvastatin (LIPITOR) 20 MG tablet Take 20 mg by mouth at bedtime.   Yes Historical Provider, MD  Calcium Carbonate-Vitamin D 600-400 MG-UNIT per tablet Take 1 tablet by mouth daily with breakfast. 10/26/14 10/26/15 Yes Historical Provider, MD  chlorthalidone (HYGROTON) 25 MG tablet Take 25 mg by mouth daily with breakfast.   Yes Historical  Provider, MD  Cholecalciferol (VITAMIN D) 2000 UNITS tablet Take 2,000 Units by mouth daily.   Yes Historical Provider, MD  donepezil (ARICEPT) 5 MG tablet Take 1 tablet (5 mg total) by mouth at bedtime. 08/14/14  Yes Kennyth Arnold, FNP  fluticasone (FLONASE) 50 MCG/ACT nasal spray Place 2 sprays into both nostrils daily.   Yes Historical Provider, MD  gabapentin (NEURONTIN) 100 MG capsule Take 100 mg by mouth at bedtime.   Yes Historical Provider, MD  hydroxypropyl methylcellulose / hypromellose (ISOPTO TEARS / GONIOVISC) 2.5 % ophthalmic solution Place 1 drop into both eyes every 2 (two) hours as needed for dry eyes.   Yes Historical Provider, MD  lisinopril (PRINIVIL,ZESTRIL) 10 MG tablet Take 10 mg by mouth daily with breakfast.   Yes Historical Provider, MD  loratadine (CLARITIN) 10 MG tablet Take 10 mg by mouth daily.   Yes Historical Provider, MD  Melatonin 1 MG TABS Take 1 tablet by mouth at bedtime.   Yes Historical Provider, MD  Multiple Vitamins-Minerals (THERA-TABS M) TABS Take 1 tablet by mouth daily with breakfast.   Yes Historical Provider, MD  polyethylene glycol (MIRALAX / GLYCOLAX) packet Take 17 g by mouth daily.   Yes Historical Provider, MD  risperiDONE (RISPERDAL) 0.5 MG tablet Take 0.5 mg by mouth daily with breakfast.   Yes Historical Provider, MD  risperiDONE (RISPERDAL) 1 MG tablet Take 1 tablet (1 mg total) by mouth at bedtime. 08/14/14  Yes Padonda B  Justin Mend, FNP  sertraline (ZOLOFT) 50 MG tablet Take 50 mg by mouth daily.   Yes Historical Provider, MD  vitamin B-12 (CYANOCOBALAMIN) 500 MCG tablet Take 1,000 mcg by mouth daily.    Yes Historical Provider, MD  atenolol (TENORMIN) 25 MG tablet Take 0.5 tablets (12.5 mg total) by mouth daily. Patient not taking: Reported on 12/27/2014 08/14/14   Kennyth Arnold, FNP  bisoprolol-hydrochlorothiazide [facility]) 5-6.25 MG per tablet Take 1 tablet by mouth daily. Patient not taking: Reported on 12/27/2014 08/14/14   Kennyth Arnold, FNP     Scheduled Meds:  Continuous Infusions: . sodium chloride 1,000 mL (12/27/14 1219)  . sodium chloride     PRN Meds:.  Allergies as of 12/27/2014 - Review Complete 12/27/2014  Allergen Reaction Noted  . Pollen extract Other (See Comments) 07/24/2014    Family History  Problem Relation Age of Onset  . Hypertension Mother   . Hyperlipidemia Mother   . Heart disease Father     35s  . Cancer Brother     History   Social History  . Marital Status: Divorced    Spouse Name: N/A  . Number of Children: N/A  . Years of Education: N/A   Occupational History  . Not on file.   Social History Main Topics  . Smoking status: Former Smoker -- 1.00 packs/day    Types: Cigarettes    Quit date: 09/02/1971  . Smokeless tobacco: Never Used  . Alcohol Use: Yes     Comment: occasionally  . Drug Use: No  . Sexual Activity: Not Currently   Other Topics Concern  . Not on file   Social History Narrative   Lives in a townhouse with steps.  Lives alone.  Ambulates without assist device.  Drives, pays own bills, and completes ADLs without assistance.  Pt. Current/past profession-Concierge. Pt. Does exercise (walk) 1 X Day. Pt. Does have a living will. Pt. Do have a DNR form. And also Pt. Have signed POA/HPOA forms.      Neighbor, [name], is her emergency contact:  [phone]   Full code                   Review of Systems: All negative from GI standpoint except as stated above in HPI; patient unsure on other systems  Physical Exam: Vital signs: Filed Vitals:   12/27/14 1539  BP: 96/45  Pulse: 88  Temp: 98.2 F (36.8 C)  Resp: 20     General:   Lethargic, pale, elderly, obese  Head: normocephalic EENT: anicteric, oropharynx clear Neck: supple, nontender Lungs:  Clear throughout to auscultation.   No wheezes, crackles, or rhonchi. No acute distress. Heart:  Regular rate and rhythm; no murmurs, clicks, rubs,  or gallops. Abdomen: soft, nontender, nondistended, +BS  Rectal:   Deferred Ext: no edema Skin: pallor  GI:  Lab Results:  Recent Labs  12/27/14 1141  WBC 7.5  HGB 5.9*  HCT 18.7*  PLT 279   BMET  Recent Labs  12/27/14 1141  NA 142  K 3.4*  CL 103  CO2 29  GLUCOSE 109*  BUN 42*  CREATININE 0.94  CALCIUM 9.8   LFT  Recent Labs  12/27/14 1141  PROT 5.9*  ALBUMIN 3.4*  AST 19  ALT 15  ALKPHOS 56  BILITOT 0.8   PT/INR  Recent Labs  12/27/14 1141  LABPROT 16.1*  INR 1.27     Studies/Results: Ct Head Wo Contrast  12/27/2014   CLINICAL DATA:  Altered mental status and gait disturbance  EXAM: CT HEAD WITHOUT CONTRAST  TECHNIQUE: Contiguous axial images were obtained from the base of the skull through the vertex without intravenous contrast.  COMPARISON:  November 26, 2013  FINDINGS: There is mild diffuse atrophy. There is no intracranial mass, hemorrhage, extra-axial fluid collection, or midline shift. There is patchy small vessel disease in the centra semiovale bilaterally, stable. No new gray-white compartment lesions are identified. No acute infarct apparent. Bony calvarium appears intact. The mastoid air cells are clear. There is debris in each external auditory canal.  IMPRESSION: Atrophy with small vessel disease in the periventricular white matter bilaterally. No intracranial mass, hemorrhage, or acute appearing infarct. Probable cerumen in each external auditory canal.   Electronically Signed   By: Lowella Grip III M.D.   On: 12/27/2014 12:18   Dg Chest Portable 1 View  12/27/2014   CLINICAL DATA:  Generalize weakness.  EXAM: PORTABLE CHEST - 1 VIEW  COMPARISON:  09/29/2014  FINDINGS: The patient is mildly rotated to the right, partially limiting evaluation of the mediastinum. Cardiomediastinal silhouette is grossly unchanged with evidence of tortuosity and likely ectasia of the thoracic aorta. Moderate-sized hiatal hernia is again seen. No airspace consolidation, edema, pleural effusion, or pneumothorax is identified. No  acute osseous abnormality is seen.  IMPRESSION: No active disease.   Electronically Signed   By: Logan Bores   On: 12/27/2014 12:14    Impression/Plan: 81 yo with signs of a GI bleed with melena on rectal exam likely due to an upper tract source such as a peptic ulcer vs. AVMs. No abdominal pain or hematemesis. Aggressive volume resuscitation with fluids and blood. Protonix drip. EGD in AM to look for a peptic ulcer source. Keep NPO.       [place] C.  12/27/2014, 4:12 PM

## 2014-12-28 NOTE — Transfer of Care (Signed)
Immediate Anesthesia Transfer of Care Note  Patient: Jamie [name]  Procedure(s) Performed: Procedure(s): ESOPHAGOGASTRODUODENOSCOPY (EGD) WITH PROPOFOL (N/A)  Patient Location: PACU and Endoscopy Unit  Anesthesia Type:MAC  Level of Consciousness: awake, oriented and patient cooperative  Airway & Oxygen Therapy: Patient Spontanous Breathing and Patient connected to nasal cannula oxygen  Post-op Assessment: Report given to RN and Post -op Vital signs reviewed and stable  Post vital signs: Reviewed and stable  Last Vitals:  Filed Vitals:   12/28/14 0746  BP:   Pulse:   Temp: 36.8 C  Resp:     Complications: No apparent anesthesia complications

## 2014-12-28 NOTE — Anesthesia Preprocedure Evaluation (Addendum)
Anesthesia Evaluation  Patient identified by MRN, date of birth, ID band Patient awake    Reviewed: Allergy & Precautions, NPO status , Patient's Chart, lab work & pertinent test results  Airway Mallampati: II  TM Distance: >3 FB Neck ROM: Full    Dental no notable dental hx.    Pulmonary former smoker,  breath sounds clear to auscultation  Pulmonary exam normal       Cardiovascular hypertension, Pt. on medications - anginaRhythm:Regular Rate:Normal  No previous cardiac history. This admission troponin elevated in setting of profound anemia Hg5 on admission. S/p transfusion 2 units prbc.    Neuro/Psych Anxiety Depression dementia negative neurological ROS  negative psych ROS   GI/Hepatic Neg liver ROS, PUD,   Endo/Other  negative endocrine ROS  Renal/GU negative Renal ROS  negative genitourinary   Musculoskeletal negative musculoskeletal ROS (+)   Abdominal   Peds negative pediatric ROS (+)  Hematology negative hematology ROS (+)   Anesthesia Other Findings   Reproductive/Obstetrics negative OB ROS                            Anesthesia Physical Anesthesia Plan  ASA: III  Anesthesia Plan: MAC   Post-op Pain Management:    Induction:   Airway Management Planned: Nasal Cannula  Additional Equipment:   Intra-op Plan:   Post-operative Plan:   Informed Consent: I have reviewed the patients History and Physical, chart, labs and discussed the procedure including the risks, benefits and alternatives for the proposed anesthesia with the patient or authorized representative who has indicated his/her understanding and acceptance.   Dental advisory given  Plan Discussed with: CRNA  Anesthesia Plan Comments:         Anesthesia Quick Evaluation

## 2014-12-28 NOTE — Care Management Note (Signed)
  Page 1 of 1   12/28/2014     1:04:48 PM CARE MANAGEMENT NOTE 12/28/2014  Patient:  Jamie [name], Jamie [name]   Account Number:  [id number]  Date Initiated:  12/28/2014  Documentation initiated by:  Gordy Goar  Subjective/Objective Assessment:   upper gi bleed requiring bld products and endoscopy for bld control     Action/Plan:   will follow for needs and outcome   Anticipated DC Date:  12/31/2014   Anticipated DC Plan:  ASSISTED LIVING / REST HOME  In-house referral  Clinical Social Worker      DC Planning Services  CM consult      [facility] Choice  NA   Choice offered to / List presented to:  NA      DME agency  NA        [place] agency  NA   Status of service:  In process, will continue to follow Medicare Important Message given?   (If response is "NO", the following Medicare IM given date fields will be blank) Date Medicare IM given:   Medicare IM given by:   Date Additional Medicare IM given:   Additional Medicare IM given by:    Discharge Disposition:    Per UR Regulation:  Reviewed for med. necessity/level of care/duration of stay  If discussed at [place] of Stay Meetings, dates discussed:    Comments:  December 28, 2014/Damon Baisch L. Rosana Hoes, RN, BSN, CCM. Case Management [place] [phone] No discharge needs present of time of review.

## 2014-12-28 NOTE — ED Notes (Addendum)
Pt. Blood pressure decreased 77/37. RN,Kellee made aware.

## 2014-12-28 NOTE — Brief Op Note (Signed)
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 See endopro note for details. Clear liquid diet. Continue Protonix drip.

## 2014-12-28 NOTE — ED Notes (Signed)
Bed: WA17 Expected date:  Expected time:  Means of arrival:  Comments: ENDO

## 2014-12-28 NOTE — ED Notes (Signed)
Report called to Megan, RN.

## 2014-12-28 NOTE — Interval H&P Note (Signed)
History and Physical Interval Note:  12/28/2014 8:00 AM  Jamie [name]  has presented today for surgery, with the diagnosis of gib  The various methods of treatment have been discussed with the patient and family. After consideration of risks, benefits and other options for treatment, the patient has consented to  Procedure(s): ESOPHAGOGASTRODUODENOSCOPY (EGD) WITH PROPOFOL (N/A) as a surgical intervention .  The patient's history has been reviewed, patient examined, no change in status, stable for surgery.  I have reviewed the patient's chart and labs.  Questions were answered to the patient's satisfaction.     Trego C.

## 2014-12-28 NOTE — Progress Notes (Signed)
Patient ID: Jamie [name], female   DOB: July 03, 1930, 81 y.o.   MRN: [id number]  [facility] PROGRESS NOTE  Jamie [name] TMH:[id number] DOB: April 01, 1930 DOA: 23-Jan-2015 PCP: Kennyth Arnold, FNP   Brief narrative:    81 y.o. female, with HTN, HLD, depression, anxiety, presented to [facility] ED with main concern of progressively worsening weakness, poor oral intake, malaise, resulting in being mostly bed bound for the past several days. Pt reported 2 weeks duration of intermittent melena. Pt denied chest pain or shortness of breath.   In ED, pt noted to be hypotensive with SBP in 70's, blood work notable for Hg 5.9 (recent Hg in January 2016 was 15.9), melena on physical exam noted. [facility] asked to admit pt to SDU and GI team consulted.   Assessment/Plan:    Acute blood loss anemia - pt presented with Hg 5.9 which is down from her last known baseline of 15.9 - EGD done by Dr. Michail Sermon notable for large hiatal hernia and bleeding duodenal AVM, s/p APC - recommendation is to continue monitoring H/H, advance diet to clear liquids  - has received total of three U PRBC since admission  - Hg 8.6 this AM which is an appropriate increase in post transfusion Hg Elevated troponins - in the setting of the above, demands ischemia - no chest pain this AM - monitor on telemetry  - if develops chest pain, obtain 12 lead EKG  - would not cycle CE's at this point as pt denies chest pain  Hypokalemia - supplement and repeat BMP in AM Hypertension - BP still low 100/48 - monitor VS in SDU  Hyperlipidemia - Continue with statin History of dementia with behavioral disturbances - Resume home medications once tolerating PO Obesity  - Body mass index is 31.76  DVT Prophylaxis  - SCDs   Code Status: Full.  Family Communication:  patient has no family in [place], Leanna Sato is [place]un[place] [phone] , currently [facility] guardian  Disposition Plan: Keep in SDU, will need PT evaluation   IV access:  Peripheral  IV  Procedures and diagnostic studies:    Ct Head Wo Contrast  2015-01-23  Atrophy with small vessel disease in the periventricular white matter bilaterally. No intracranial mass, hemorrhage, or acute appearing infarct. Probable cerumen in each external auditory canal.     Dg Chest Portable 1 View  01/23/15   No active disease.    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 See endopro note for details. Clear liquid diet. Continue Protonix drip.  Medical Consultants:  GI  Other Consultants:  None  IAnti-Infectives:   None  [name], MD  [facility] Pager [phone]  If 7PM-7AM, please contact night-coverage [email or web address] Password [facility] 12/28/2014, 3:52 PM   LOS: 1 day   HPI/Subjective: No events overnight. Pt reports still feeling weak.  Objective: Filed Vitals:   12/28/14 1100 12/28/14 1210 12/28/14 1410 12/28/14 1500  BP: 102/73 132/51 124/59 112/55  Pulse: 63 73 79 75  Temp:      TempSrc:      Resp: 12 16 18 16   SpO2: 99% 96% 93% 95%    Intake/Output Summary (Last 24 hours) at 12/28/14 1552 Last data filed at 12/28/14 1500  Gross per 24 hour  Intake 830.41 ml  Output   1800 ml  Net -969.59 ml    Exam:   General:  Pt is alert, follows commands appropriately, not in acute distress  Cardiovascular: Regular rate and rhythm, no rubs, no gallops  Respiratory: Clear to auscultation bilaterally, no wheezing, no crackles, no rhonchi  Abdomen: Soft, non tender, non distended, bowel sounds present, no guarding  Extremities: pulses DP and PT palpable bilaterally  Neuro: Grossly nonfocal  Data Reviewed: Basic Metabolic Panel:  Recent Labs Lab 12/27/14 1141  NA 142  K 3.4*  CL 103  CO2 29  GLUCOSE 109*  BUN 42*  CREATININE 0.94  CALCIUM 9.8   Liver Function Tests:  Recent Labs Lab 12/27/14 1141  AST 19  ALT 15  ALKPHOS 56  BILITOT 0.8  PROT 5.9*  ALBUMIN 3.4*   CBC:  Recent Labs Lab 12/27/14 1141 12/27/14 1653  12/27/14 2307 12/28/14 1459  WBC 7.5  --   --   --   NEUTROABS 5.5  --   --   --   HGB 5.9* 8.7* 8.6* 8.6*  HCT 18.7* 26.8* 26.5* 26.4*  MCV 94.0  --   --   --   PLT 279  --   --   --    Cardiac Enzymes:  Recent Labs Lab 12/27/14 2307 12/28/14 0953  TROPONINI 0.21* 0.12*   Scheduled Meds: . atorvastatin  20 mg Oral QHS  . donepezil  5 mg Oral QHS  . loratadine  10 mg Oral Daily  . pantoprazole   40 mg Intravenous Q12H  . risperiDONE  0.5 mg Oral Q breakfast  . risperiDONE  1 mg Oral QHS  . sertraline  50 mg Oral Daily  . vitamin B-12  1,000 mcg Oral Daily   Continuous Infusions: . sodium chloride 1,000 mL (12/28/14 1253)  . pantoprozole (PROTONIX) infusion 8 mg/hr (12/28/14 1500)

## 2014-12-28 NOTE — ED Notes (Signed)
Spoke with Dr. Hal Hope about patient's elevated troponin and hypotension. Verbal orders for 500cc NS bolus and repeat CBC.

## 2014-12-28 NOTE — Anesthesia Postprocedure Evaluation (Signed)
  Anesthesia Post-op Note  Patient: Jamie [name]  Procedure(s) Performed: Procedure(s) (LRB): ESOPHAGOGASTRODUODENOSCOPY (EGD) WITH PROPOFOL (N/A)  Patient Location: PACU  Anesthesia Type: MAC  Level of Consciousness: awake and alert   Airway and Oxygen Therapy: Patient Spontanous Breathing  Post-op Pain: mild  Post-op Assessment: Post-op Vital signs reviewed, Patient's Cardiovascular Status Stable, Respiratory Function Stable, Patent Airway and No signs of Nausea or vomiting  Last Vitals:  Filed Vitals:   12/28/14 1019  BP: 109/47  Pulse: 73  Temp: 36.8 C  Resp: 20    Post-op Vital Signs: stable   Complications: No apparent anesthesia complications

## 2014-12-28 NOTE — Op Note (Signed)
[facility] [place], [address]   ENDOSCOPY PROCEDURE REPORT  PATIENT: Jamie [name], Jamie [name]  MR#: [id number] BIRTHDATE: Mar 09, 1930 , 85  yrs. old GENDER: female ENDOSCOPIST: Wilford Corner, MD REFERRED BY:  hospital team PROCEDURE DATE:  12-31-14 PROCEDURE:  EGD w/ ablation ASA CLASS:     Class III INDICATIONS:  melena and GI bleed. MEDICATIONS: Monitored anesthesia care and Per Anesthesia TOPICAL ANESTHETIC:  DESCRIPTION OF PROCEDURE: After the risks benefits and alternatives of the procedure were thoroughly explained, informed consent was obtained.  The Owen [id number] endoscope was introduced through the mouth and advanced to the second portion of the duodenum , Without limitations.  The instrument was slowly withdrawn as the mucosa was fully examined. Estimated blood loss is zero unless otherwise noted in this procedure report.    Esophagus normal and GEJ 32 cm from the incisors. Minimal scattered nonbleeding erosions in the stomach body. Large hiatal hernia noted without any active bleeding or ulcers seen. Duodenal bulb normal. Small amount of active bleeding seen in the 2nd part of the duodenum from an AVM. S/P ablation of AVM with argon plasma coagulation (APC) and hemostasis achieved.       Retroflexed views revealed a large hiatal hernia.     The scope was then withdrawn from the patient and the procedure completed.  COMPLICATIONS: There were no immediate complications.  ENDOSCOPIC IMPRESSION:     Bleeding Duodenal AVM - s/p APC (see above) Large Hiatal Hernia  RECOMMENDATIONS:     Follow H/Hs; Supportive care; Clear liquid diet   eSigned:  Wilford Corner, MD Dec 31, 2014 9:06 AM    CC:  CPT CODES: ICD CODES:  The ICD and CPT codes recommended by this software are interpretations from the data that the clinical staff has captured with the software.  The verification of the translation of this report to the ICD  and CPT codes and modifiers is the sole responsibility of the health care institution and practicing physician where this report was generated.  [employer] will not be held responsible for the validity of the ICD and CPT codes included on this report.  AMA assumes no liability for data contained or not contained herein. CPT is a Designer, television/film set of the [employer].  PATIENT NAME:  Jamie [name], Jamie [name] MR#: [id number]

## 2014-12-29 ENCOUNTER — Encounter (HOSPITAL_COMMUNITY): Payer: Self-pay | Admitting: Gastroenterology

## 2014-12-29 LAB — BASIC METABOLIC PANEL
Anion gap: 7 (ref 5–15)
BUN: 13 mg/dL (ref 6–23)
CHLORIDE: 107 mmol/L (ref 96–112)
CO2: 27 mmol/L (ref 19–32)
Calcium: 8.6 mg/dL (ref 8.4–10.5)
Creatinine, Ser: 0.83 mg/dL (ref 0.50–1.10)
GFR calc Af Amer: 74 mL/min — ABNORMAL LOW (ref >90)
GFR calc non Af Amer: 63 mL/min — ABNORMAL LOW (ref >90)
Glucose, Bld: 93 mg/dL (ref 70–99)
POTASSIUM: 3.4 mmol/L — AB (ref 3.5–5.1)
Sodium: 141 mmol/L (ref 135–145)

## 2014-12-29 MED ORDER — GABAPENTIN 100 MG PO CAPS
100.0000 mg | ORAL_CAPSULE | Freq: Every day | ORAL | Status: DC
  Administered 2014-12-29 – 2014-12-31 (×3): 100 mg via ORAL
  Filled 2014-12-29 (×4): qty 1

## 2014-12-29 MED ORDER — LISINOPRIL 10 MG PO TABS
10.0000 mg | ORAL_TABLET | Freq: Every day | ORAL | Status: DC
Start: 2014-12-30 — End: 2015-01-01
  Administered 2014-12-30 – 2015-01-01 (×3): 10 mg via ORAL
  Filled 2014-12-29 (×4): qty 1

## 2014-12-29 NOTE — Progress Notes (Signed)
Patient ID: Jamie [name], female   DOB: 01/31/1930, 81 y.o.   MRN: [id number]  [facility] PROGRESS NOTE  Jamie [name] HGD:[id number] DOB: 1930/03/01 DOA: 2015-01-15 PCP: Kennyth Arnold, FNP   Brief narrative:    81 y.o. female, with HTN, HLD, depression, anxiety, presented to [facility] ED with main concern of progressively worsening weakness, poor oral intake, malaise, resulting in being mostly bed bound for the past several days. Pt reported 2 weeks duration of intermittent melena. Pt denied chest pain or shortness of breath.   In ED, pt noted to be hypotensive with SBP in 70's, blood work notable for Hg 5.9 (recent Hg in January 2016 was 15.9), melena on physical exam noted. [facility] asked to admit pt to SDU and GI team consulted.   Assessment/Plan:    Acute blood loss anemia - pt presented with Hg 5.9 which is down from her last known baseline of 15.9 - EGD done by Dr. Michail Sermon notable for large hiatal hernia and bleeding duodenal AVM, s/p APC - recommendation is to continue monitoring H/H, advance diet to regular of pt able to tolerate  - has received total of three U PRBC since admission  - Hg 8.6 this AM which is an appropriate increase in post transfusion Hg and Hg has been stable over the past 24 hours with no additional signs of bleeding  - repeat CBC in AM Elevated troponins - in the setting of the above, demands ischemia - no chest pain this AM - would not cycle CE's at this point as pt denies chest pain  - stable for transfer to telemetry unit  Right foot pain - requested PT evaluation   Hypokalemia - supplemented, BMP pending this AM, will follow up and supplement if needed  Hypertension - BP improved, resume home medical regimen Lisinopril  Hyperlipidemia - Continue with statin History of dementia with behavioral disturbances - Resume home medications  Obesity  - Body mass index is 31.76  DVT Prophylaxis  - SCDs   Code Status: Full.  Family Communication:  patient has  no family in [place], Leanna Sato is [place] [phone] , currently [facility] guardian  Disposition Plan: Stable for transfer to telemetry unit   IV access:  Peripheral IV  Procedures and diagnostic studies:    Ct Head Wo Contrast  01-15-15  Atrophy with small vessel disease in the periventricular white matter bilaterally. No intracranial mass, hemorrhage, or acute appearing infarct. Probable cerumen in each external auditory canal.     Dg Chest Portable 1 View  15-Jan-2015   No active disease.    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 See endopro note for details. Clear liquid diet. Continue Protonix drip.  Medical Consultants:  GI  Other Consultants:  None  IAnti-Infectives:   None  [name], MD  [facility] Pager [phone]  If 7PM-7AM, please contact night-coverage [email or web address] Password [email or web address] 12/29/2014, 11:03 AM   LOS: 2 days   HPI/Subjective: No events overnight. Pt reports still feeling weak and right foot pain, intermittent and 5/10 in severity, worse with movement and better with rest, non radiating.   Objective: Filed Vitals:   12/29/14 0600 12/29/14 0800 12/29/14 0900 12/29/14 1000  BP: 129/52 159/70  166/72  Pulse: 80 88 96 86  Temp:  98.5 F (36.9 C)    TempSrc:  Oral    Resp: 13 15 15 18   Height:      Weight:      SpO2: 97% 96% 97% 97%  Intake/Output Summary (Last 24 hours) at 12/29/14 1103 Last data filed at 12/29/14 1000  Gross per 24 hour  Intake 2292.91 ml  Output   1400 ml  Net 892.91 ml    Exam:   General:  Pt is alert, follows commands appropriately, not in acute distress  Cardiovascular: Regular rate and rhythm, no rubs, no gallops  Respiratory: Clear to auscultation bilaterally, no wheezing, diminished breath sounds at bases   Abdomen: Soft, non tender, non distended, bowel sounds present, no guarding  Extremities: pulses DP and PT palpable bilaterally, TTP in the right foot area   Neuro: Grossly  nonfocal  Data Reviewed: Basic Metabolic Panel:  Recent Labs Lab 12/27/14 1141  NA 142  K 3.4*  CL 103  CO2 29  GLUCOSE 109*  BUN 42*  CREATININE 0.94  CALCIUM 9.8   Liver Function Tests:  Recent Labs Lab 12/27/14 1141  AST 19  ALT 15  ALKPHOS 56  BILITOT 0.8  PROT 5.9*  ALBUMIN 3.4*   CBC:  Recent Labs Lab 12/27/14 1141 12/27/14 1653 12/27/14 2307 12/28/14 1459  WBC 7.5  --   --   --   NEUTROABS 5.5  --   --   --   HGB 5.9* 8.7* 8.6* 8.6*  HCT 18.7* 26.8* 26.5* 26.4*  MCV 94.0  --   --   --   PLT 279  --   --   --    Cardiac Enzymes:  Recent Labs Lab 12/27/14 2307 12/28/14 0953  TROPONINI 0.21* 0.12*   Scheduled Meds: . atorvastatin  20 mg Oral QHS  . donepezil  5 mg Oral QHS  . loratadine  10 mg Oral Daily  . pantoprazole   40 mg Intravenous Q12H  . risperiDONE  0.5 mg Oral Q breakfast  . risperiDONE  1 mg Oral QHS  . sertraline  50 mg Oral Daily  . vitamin B-12  1,000 mcg Oral Daily   Continuous Infusions: . sodium chloride 1,000 mL (12/29/14 1000)  . pantoprozole (PROTONIX) infusion 8 mg/hr (12/29/14 1000)

## 2014-12-29 NOTE — Progress Notes (Signed)
Patient ID: Jamie [name], female   DOB: 09-18-1929, 81 y.o.   MRN: [id number] [facility] Progress Note  SHERRELL WEIR 81 y.o. Jul 19, 1930   Subjective: Complaining of right foot pain. Denies abdominal pain, nausea, or vomiting. No BMs or rectal bleeding overnight.  Objective: Vital signs in last 24 hours: Filed Vitals:   12/29/14 1000  BP: 166/72  Pulse: 86  Temp:   Resp: 18    Physical Exam: Gen: lethargic, elderly, frail, no acute distress CV: RRR Chest: CTA B Abd: soft, nontender, nondistended, +BS Ext: no edema, pulses intact  Lab Results:  Recent Labs  12/27/14 1141  NA 142  K 3.4*  CL 103  CO2 29  GLUCOSE 109*  BUN 42*  CREATININE 0.94  CALCIUM 9.8    Recent Labs  12/27/14 1141  AST 19  ALT 15  ALKPHOS 56  BILITOT 0.8  PROT 5.9*  ALBUMIN 3.4*    Recent Labs  12/27/14 1141  12/27/14 2307 12/28/14 1459  WBC 7.5  --   --   --   NEUTROABS 5.5  --   --   --   HGB 5.9*  < > 8.6* 8.6*  HCT 18.7*  < > 26.5* 26.4*  MCV 94.0  --   --   --   PLT 279  --   --   --   < > = values in this interval not displayed.  Recent Labs  12/27/14 1141  LABPROT 16.1*  INR 1.27      Assessment/Plan: 81 yo s/p GI bleed due to a duodenal AVM that was ablated during EGD yesterday. Hgb stable. No further bleeding. Nurse informed of right foot pain. Defer further evaluation of foot pain to Dr. Doyle Askew. Advance diet. Will sign off. Call if questions.   [place] C. 12/29/2014, 10:33 AM

## 2014-12-29 NOTE — Progress Notes (Signed)
CSW continuing to follow.  CSW received notification that pt admitted from [facility].   CSW reviewed chart and per MD note, [name] is guardian. CSW contacted [name] who states that she used to be pt neighbor and was at one point pt HCPOA as pt does not have any family in [place]. Per [place] resigned as pt HCPOA. Per [name], pt has been deemed incompetent to make her own decision by the court and has been assigned a guardian. Per [name] that only name that she has is the guardian intake worker's and that is Merdis Delay with [facility].   CSW contacted Merdis Delay with [facility] who stated that pt has been assigned to guardian, [name] (phone number: [phone]) and Ms. Weeks would be pt decision maker at this time. CSW updated Mr. Ok Anis on pt admission and PT evaluation that pt may need ST re[facility] at [facility]. Per Mr. Ok Anis, Mr. Ok Anis asked CSW to contact [facility] to discuss if they could continue to meet pt needs and Mr. Ok Anis will notify Ms. Weeks about pt current weakness and questionable need for short term rehab in order for Ms. Weeks to advise further on disposition planning.   CSW contacted [facility] and spoke with RN director Dana Allan who stated that pt admitted to [facility] memory care unit on 11/27/2014 and pt ambulates independently to dining room at baseline. CSW discussed with [facility] memory care unit RN pt current medical conditions and PT evaluation. [facility] stated that they would be willing to evaluate pt to determine if ALF can continue to meet pt needs and to notify [facility] closer to pt being medically ready for d/c.   CSW to continue to follow pt progress and await phone call from pt guardian, [name] to assist with decision making surrounding pt disposition plan.   [name], MSW, [place] [facility] [phone]

## 2014-12-29 NOTE — Evaluation (Signed)
Physical Therapy Evaluation Patient Details Name: SILVA AAMODT MRN: [id number] DOB: 07/24/1930 Today's Date: 12/29/2014   History of Present Illness  81 yo female admitted with GI bleed. Pt is from ALF  Clinical Impression  On eval, pt required Min assist for mobility-able to perform stand pivot from bed to recliner with RW. Mobility limited by R ankle/heel pain. Attempted ambulation but pt was unable to tolerate. At baseline, pt is Ind with mobility and walks to dining room at ALF. At this time, recommend ST rehab at [facility] unless ALF feels they can provide increased assistance.     Follow Up Recommendations SNF (unless ALF can provide increased assistance. )    Equipment Recommendations  Rolling walker with 5" wheels    Recommendations for Other Services OT consult     Precautions / Restrictions Precautions Precautions: None Restrictions Weight Bearing Restrictions: No      Mobility  Bed Mobility Overal bed mobility: Needs Assistance Bed Mobility: Supine to Sit     Supine to sit: Mod assist;Min assist;HOB elevated     General bed mobility comments: Assist to scoot to EOB. Increased time.   Transfers Overall transfer level: Needs assistance Equipment used: Rolling walker (2 wheeled) Transfers: Sit to/from [facility] Sit to Stand: Mod assist Stand pivot transfers: Min assist       General transfer comment: Assist to rise, stabilize, control descent. Stand pivot from bed to recliner with RW.   Ambulation/Gait             General Gait Details: Attempted but pt unble due to R ankle/heel pain  Stairs            Wheelchair Mobility    Modified Rankin (Stroke Patients Only)       Balance                                             Pertinent Vitals/Pain Pain Assessment: Faces Faces Pain Scale: Hurts whole lot Pain Location: R ankle/heel area with WBing and palpation Pain Descriptors / Indicators: Sore Pain  Intervention(s): Limited activity within patient's tolerance;Repositioned    Home Living Family/patient expects to be discharged to:: Unsure               Home Equipment: None      Prior Function Level of Independence: Independent               Hand Dominance        Extremity/Trunk Assessment   Upper Extremity Assessment: Generalized weakness           Lower Extremity Assessment: Generalized weakness      Cervical / Trunk Assessment: Normal  Communication   Communication: No difficulties  Cognition Arousal/Alertness: Awake/alert Behavior During Therapy: WFL for tasks assessed/performed Overall Cognitive Status: Within Functional Limits for tasks assessed                      General Comments      Exercises        Assessment/Plan    PT Assessment Patient needs continued PT services  PT Diagnosis Difficulty walking;Abnormality of gait;Generalized weakness;Acute pain   PT Problem List Decreased strength;Decreased range of motion;Decreased activity tolerance;Decreased balance;Decreased mobility;Obesity;Decreased knowledge of use of DME;Pain  PT Treatment Interventions DME instruction;Gait training;Functional mobility training;Therapeutic activities;Therapeutic exercise;Patient/family education;Balance training   PT Goals (Current goals  can be found in the Care Plan section) Acute Rehab PT Goals Patient Stated Goal: less pain. walk. PT Goal Formulation: With patient Time For Goal Achievement: 01/12/15 Potential to Achieve Goals: Good    Frequency Min 3X/week   Barriers to discharge        Co-evaluation               End of Session Equipment Utilized During Treatment: Gait belt Activity Tolerance: Patient limited by pain Patient left: in chair;with call bell/phone within reach;with chair alarm set           Time: 9833-8250 PT Time Calculation (min) (ACUTE ONLY): 17 min   Charges:   PT Evaluation $Initial PT Evaluation  Tier I: 1 Procedure     PT G Codes:        Weston Anna, MPT Pager: [phone]

## 2014-12-30 ENCOUNTER — Inpatient Hospital Stay (HOSPITAL_COMMUNITY): Payer: Medicare Other

## 2014-12-30 LAB — BASIC METABOLIC PANEL
ANION GAP: 7 (ref 5–15)
BUN: 12 mg/dL (ref 6–23)
CHLORIDE: 108 mmol/L (ref 96–112)
CO2: 27 mmol/L (ref 19–32)
Calcium: 8.5 mg/dL (ref 8.4–10.5)
Creatinine, Ser: 0.78 mg/dL (ref 0.50–1.10)
GFR calc non Af Amer: 75 mL/min — ABNORMAL LOW (ref >90)
GFR, EST AFRICAN AMERICAN: 87 mL/min — AB (ref >90)
Glucose, Bld: 108 mg/dL — ABNORMAL HIGH (ref 70–99)
Potassium: 3 mmol/L — ABNORMAL LOW (ref 3.5–5.1)
Sodium: 142 mmol/L (ref 135–145)

## 2014-12-30 LAB — CBC
HEMATOCRIT: 26.4 % — AB (ref 36.0–46.0)
Hemoglobin: 8.6 g/dL — ABNORMAL LOW (ref 12.0–15.0)
MCH: 29.9 pg (ref 26.0–34.0)
MCHC: 32.6 g/dL (ref 30.0–36.0)
MCV: 91.7 fL (ref 78.0–100.0)
PLATELETS: 238 10*3/uL (ref 150–400)
RBC: 2.88 MIL/uL — AB (ref 3.87–5.11)
RDW: 15 % (ref 11.5–15.5)
WBC: 8 10*3/uL (ref 4.0–10.5)

## 2014-12-30 LAB — URIC ACID: URIC ACID, SERUM: 4.6 mg/dL (ref 2.4–7.0)

## 2014-12-30 MED ORDER — SENNA 8.6 MG PO TABS
1.0000 | ORAL_TABLET | Freq: Two times a day (BID) | ORAL | Status: DC
  Administered 2014-12-30 – 2015-01-01 (×4): 8.6 mg via ORAL
  Filled 2014-12-30 (×3): qty 1

## 2014-12-30 MED ORDER — POTASSIUM CHLORIDE CRYS ER 20 MEQ PO TBCR
40.0000 meq | EXTENDED_RELEASE_TABLET | Freq: Once | ORAL | Status: AC
  Administered 2014-12-30: 40 meq via ORAL
  Filled 2014-12-30: qty 2

## 2014-12-30 MED ORDER — POTASSIUM CHLORIDE CRYS ER 20 MEQ PO TBCR
30.0000 meq | EXTENDED_RELEASE_TABLET | ORAL | Status: DC
  Filled 2014-12-30: qty 1

## 2014-12-30 MED ORDER — SULFAMETHOXAZOLE-TRIMETHOPRIM 800-160 MG PO TABS
1.0000 | ORAL_TABLET | Freq: Two times a day (BID) | ORAL | Status: DC
  Administered 2014-12-30 – 2015-01-01 (×5): 1 via ORAL
  Filled 2014-12-30 (×6): qty 1

## 2014-12-30 MED ORDER — HYDROCODONE-ACETAMINOPHEN 5-325 MG PO TABS
1.0000 | ORAL_TABLET | ORAL | Status: DC | PRN
  Administered 2014-12-30: 1 via ORAL
  Filled 2014-12-30 (×2): qty 1

## 2014-12-30 MED ORDER — POTASSIUM CHLORIDE CRYS ER 20 MEQ PO TBCR
30.0000 meq | EXTENDED_RELEASE_TABLET | ORAL | Status: AC
  Administered 2014-12-30 (×2): 30 meq via ORAL
  Filled 2014-12-30 (×2): qty 1

## 2014-12-30 NOTE — Progress Notes (Signed)
Tried to get pt. up out of bed to ambulate. She states she can not walk or put any pressure on right foot. Could not even pivot to bed. Continue to assist with movement. Elevate right foot.

## 2014-12-30 NOTE — Progress Notes (Signed)
Patient ID: Jamie [name], female   DOB: Nov 21, 1929, 81 y.o.   MRN: [id number]  [facility] PROGRESS NOTE  LEGNA MAUSOLF UTM:[id number] DOB: 03-May-1930 DOA: 2015/01/13 PCP: Kennyth Arnold, FNP   Brief narrative:    81 y.o. female, with HTN, HLD, depression, anxiety, presented to [facility] ED with main concern of progressively worsening weakness, poor oral intake, malaise, resulting in being mostly bed bound for the past several days. Pt reported 2 weeks duration of intermittent melena. Pt denied chest pain or shortness of breath.   In ED, pt noted to be hypotensive with SBP in 70's, blood work notable for Hg 5.9 (recent Hg in January 2016 was 15.9), melena on physical exam noted. TR[facility] asked to admit pt to SDU and GI team consulted.   Assessment/Plan:    Acute blood loss anemia - pt presented with Hg 5.9 which is down from her last known baseline of 15.9 - EGD done by Dr. Michail Sermon notable for large hiatal hernia and bleeding duodenal AVM, s/p APC - diet advanced to soft and pt tolerating well  - has received total of three U PRBC since admission  - Hg 8.6 this AM which is an appropriate increase in post transfusion Hg  - Hg has been stable over the past 48 hours with no additional signs of bleeding  - repeat CBC in AM Elevated troponins - in the setting of the above, demands ischemia - no chest pain this AM - telemetry has been d/c 4/29 Right foot pain - pt reports foot pain but per physical exam pain is mostly in the right ankle  - right ankle swollen with mild erythema and TTP - will ask for ankle XRAY, uric acid, start Bactrim DS BID  Hypokalemia  - still low, supplement and repeat BMP in AM Hypertension, accelerated  - BP improved, resumed home medical regimen Lisinopril  - SBP in 120's this AM Hyperlipidemia - Continue with statin History of dementia with behavioral disturbances - Resume home medications  Obesity  - Body mass index is 31.76  DVT Prophylaxis  - SCDs   Code  Status: Full.  Family Communication:  patient has no family in [place], Leanna Sato is [place] [phone] , currently [facility] guardian  Disposition Plan: Right ankle swelling and erythema, ankle XRAY requested, pt not ready for d/c  IV access:  Peripheral IV  Procedures and diagnostic studies:    Ct Head Wo Contrast  01-13-15  Atrophy with small vessel disease in the periventricular white matter bilaterally. No intracranial mass, hemorrhage, or acute appearing infarct. Probable cerumen in each external auditory canal.     Dg Chest Portable 1 View  01/13/15   No active disease.    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 See endopro note for details. Clear liquid diet. Continue Protonix drip.  Medical Consultants:  GI  Other Consultants:  None  IAnti-Infectives:   Bactrim 4/20 -->  [name], MD  [facility] Pager [phone]  If 7PM-7AM, please contact night-coverage [email or web address] Password [facility] 12/30/2014, 10:41 AM   LOS: 3 days   HPI/Subjective: No events overnight. Pt reports right foot pain.  Objective: Filed Vitals:   12/29/14 2121 12/29/14 2150 12/30/14 0352 12/30/14 0750  BP: 154/78  126/68 122/60  Pulse: 92 88 102 92  Temp: 98.5 F (36.9 C)  99.5 F (37.5 C)   TempSrc: Oral  Oral   Resp: 16  16   Height:      Weight:  SpO2: 98%  94%     Intake/Output Summary (Last 24 hours) at 12/30/14 1041 Last data filed at 12/30/14 0906  Gross per 24 hour  Intake   2230 ml  Output   1150 ml  Net   1080 ml    Exam:   General:  Pt is alert, follows commands appropriately, not in acute distress  Cardiovascular: Regular rate and rhythm, no rubs, no gallops  Respiratory: Clear to auscultation bilaterally, no wheezing, diminished breath sounds at bases   Abdomen: Soft, non tender, non distended, bowel sounds present, no guarding  Extremities: pulses DP and PT palpable bilaterally, TTP in the right ankle area, now with erythema and  swelling  Neuro: Grossly nonfocal  Data Reviewed: Basic Metabolic Panel:  Recent Labs Lab 12/27/14 1141 12/29/14 1153 12/30/14 0413  NA 142 141 142  K 3.4* 3.4* 3.0*  CL 103 107 108  CO2 29 27 27   GLUCOSE 109* 93 108*  BUN 42* 13 12  CREATININE 0.94 0.83 0.78  CALCIUM 9.8 8.6 8.5   Liver Function Tests:  Recent Labs Lab 12/27/14 1141  AST 19  ALT 15  ALKPHOS 56  BILITOT 0.8  PROT 5.9*  ALBUMIN 3.4*   CBC:  Recent Labs Lab 12/27/14 1141 12/27/14 1653 12/27/14 2307 12/28/14 1459 12/30/14 0413  WBC 7.5  --   --   --  8.0  NEUTROABS 5.5  --   --   --   --   HGB 5.9* 8.7* 8.6* 8.6* 8.6*  HCT 18.7* 26.8* 26.5* 26.4* 26.4*  MCV 94.0  --   --   --  91.7  PLT 279  --   --   --  238   Cardiac Enzymes:  Recent Labs Lab 12/27/14 2307 12/28/14 0953  TROPONINI 0.21* 0.12*   Scheduled Meds: . atorvastatin  20 mg Oral QHS  . donepezil  5 mg Oral QHS  . loratadine  10 mg Oral Daily  . pantoprazole   40 mg Intravenous Q12H  . risperiDONE  0.5 mg Oral Q breakfast  . risperiDONE  1 mg Oral QHS  . sertraline  50 mg Oral Daily  . vitamin B-12  1,000 mcg Oral Daily   Continuous Infusions: . pantoprozole (PROTONIX) infusion 8 mg/hr (12/30/14 0752)

## 2014-12-30 NOTE — Progress Notes (Signed)
K+ 3.0 with today's labs. Paged NP Walden Field and new orders received.

## 2014-12-30 NOTE — Progress Notes (Signed)
Patient found sitting on knees by the bed. Patient with dementia and does not know how she got oob and on her knees except that she was trying to go to the "bathroom". Patient assisted to sit on the floor and then assisted to her feet and back to bed. Patient only complaint is continual pain in right ankle and foot/heel that she has already been complaining with the past few days. No skin breakdown.

## 2014-12-30 NOTE — Progress Notes (Signed)
Spoke to Walden Field NP about earlier fall.

## 2014-12-31 LAB — URINALYSIS, ROUTINE W REFLEX MICROSCOPIC
Bilirubin Urine: NEGATIVE
GLUCOSE, UA: NEGATIVE mg/dL
Ketones, ur: NEGATIVE mg/dL
Leukocytes, UA: NEGATIVE
NITRITE: NEGATIVE
Protein, ur: NEGATIVE mg/dL
SPECIFIC GRAVITY, URINE: 1.014 (ref 1.005–1.030)
Urobilinogen, UA: 2 mg/dL — ABNORMAL HIGH (ref 0.0–1.0)
pH: 7 (ref 5.0–8.0)

## 2014-12-31 LAB — TYPE AND SCREEN
ABO/RH(D): O POS
ANTIBODY SCREEN: NEGATIVE
UNIT DIVISION: 0
UNIT DIVISION: 0
Unit division: 0
Unit division: 0

## 2014-12-31 LAB — BASIC METABOLIC PANEL
ANION GAP: 6 (ref 5–15)
BUN: 12 mg/dL (ref 6–20)
CALCIUM: 8.3 mg/dL — AB (ref 8.9–10.3)
CO2: 26 mmol/L (ref 22–32)
Chloride: 110 mmol/L (ref 101–111)
Creatinine, Ser: 0.93 mg/dL (ref 0.44–1.00)
GFR calc Af Amer: >60 mL/min (ref >60)
GFR calc non Af Amer: 55 mL/min — ABNORMAL LOW (ref >60)
GLUCOSE: 95 mg/dL (ref 70–99)
Potassium: 3.6 mmol/L (ref 3.5–5.1)
SODIUM: 142 mmol/L (ref 135–145)

## 2014-12-31 LAB — CBC
HEMATOCRIT: 26.1 % — AB (ref 36.0–46.0)
Hemoglobin: 8.4 g/dL — ABNORMAL LOW (ref 12.0–15.0)
MCH: 29.7 pg (ref 26.0–34.0)
MCHC: 32.2 g/dL (ref 30.0–36.0)
MCV: 92.2 fL (ref 78.0–100.0)
Platelets: 258 10*3/uL (ref 150–400)
RBC: 2.83 MIL/uL — ABNORMAL LOW (ref 3.87–5.11)
RDW: 15 % (ref 11.5–15.5)
WBC: 7.6 10*3/uL (ref 4.0–10.5)

## 2014-12-31 LAB — URINE MICROSCOPIC-ADD ON

## 2014-12-31 MED ORDER — POTASSIUM CHLORIDE CRYS ER 20 MEQ PO TBCR: 40.0000 meq | EXTENDED_RELEASE_TABLET | Freq: Once | ORAL | Status: DC

## 2014-12-31 NOTE — Progress Notes (Signed)
Patient ID: Jamie [name], female   DOB: 11/14/1929, 81 y.o.   MRN: [id number]  [facility] PROGRESS NOTE  Jamie [name] [id number]:[id number] DOB: 08-26-1930 DOA: 01/09/15 PCP: Jamie Arnold, FNP   Brief narrative:    81 y.o. female, with HTN, HLD, depression, anxiety, presented to [facility] ED with main concern of progressively worsening weakness, poor oral intake, malaise, resulting in being mostly bed bound for the past several days. Pt reported 2 weeks duration of intermittent melena. Pt denied chest pain or shortness of breath.   In ED, pt noted to be hypotensive with SBP in 70's, blood work notable for Hg 5.9 (recent Hg in January 2016 was 15.9), melena on physical exam noted. TRH asked to admit pt to SDU and GI team consulted.   Assessment/Plan:    Acute blood loss anemia - pt presented with Hg 5.9 which is down from her last known baseline of 15.9 - EGD done by Dr. Michail Sermon notable for large hiatal hernia and bleeding duodenal AVM, s/p APC - diet advanced to soft and pt tolerating well  - has received total of three U PRBC since admission  - Hg 8.4 this AM which is an appropriate increase in post transfusion Hg  - Hg has been stable over the past 72 hours with no additional signs of bleeding  - repeat CBC in AM Elevated troponins - in the setting of the above, demands ischemia - no chest pain this AM - telemetry has been d/c 4/29 Urinary urgency and frequency 5/1 - will ask for UA and urine culture - pt already on ABX Bactrim which should be adequate in coverage  Right ankle cellulitis  - pt reports foot pain but per physical exam pain is mostly in the right ankle  - right ankle swollen with mild erythema and TTP and XRAY confirming swelling, this is now improving  - continue Bactrim day #2 Hypokalemia  - supplemented and WNL  Hypertension, accelerated  - BP improved, resumed home medical regimen Lisinopril  - SBP in 110's this AM Hyperlipidemia - Continue with statin History  of dementia with behavioral disturbances - Resume home medications  Depression - stable for now but still with somewhat flat affect - declines psych evaluation  Obesity  - Body mass index is 31.76  DVT Prophylaxis  - SCDs   Code Status: Full.  Family Communication:  patient has no family in [place], Leanna Sato is [place] [phone] , currently [facility] guardian  Disposition Plan: If able to ambulate, possible d/c in 24 hours  IV access:  Peripheral IV  Procedures and diagnostic studies:    Ct Head Wo Contrast  Jan 09, 2015  Atrophy with small vessel disease in the periventricular white matter bilaterally. No intracranial mass, hemorrhage, or acute appearing infarct. Probable cerumen in each external auditory canal.     Dg Chest Portable 1 View  09-Jan-2015   No active disease.    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 See endopro note for details. Clear liquid diet. Continue Protonix drip.  Medical Consultants:  GI  Other Consultants:  None  IAnti-Infectives:   Bactrim 4/20 -->  [name], MD  [facility] Pager [phone]  If 7PM-7AM, please contact night-coverage [email or web address] Password [email or web address] 12/31/2014, 10:58 AM   LOS: 4 days   HPI/Subjective: No events overnight. Pt reports urinary urgency and frequency   Objective: Filed Vitals:   12/30/14 1446 12/30/14 2248 12/31/14 0417 12/31/14 0811  BP: 125/69 118/61 135/63 112/66  Pulse: 95  93 89   Temp: 98.6 F (37 C) 98.4 F (36.9 C) 98.3 F (36.8 C)   TempSrc: Oral Oral Oral   Resp: 16 17 16    Height:      Weight:      SpO2: 94% 96% 97%     Intake/Output Summary (Last 24 hours) at 12/31/14 1058 Last data filed at 12/31/14 0900  Gross per 24 hour  Intake   1110 ml  Output   1700 ml  Net   -590 ml    Exam:   General:  Pt is alert, follows commands appropriately, not in acute distress  Cardiovascular: Regular rate and rhythm, no rubs, no gallops, SEM 5/6  Respiratory: Clear to  auscultation bilaterally, no wheezing, diminished breath sounds at bases   Abdomen: Soft, non tender, non distended, bowel sounds present, no guarding  Extremities: pulses DP and PT palpable bilaterally, TTP in the right ankle area, now with erythema and swelling much improved since yesterday   Neuro: Grossly nonfocal  Data Reviewed: Basic Metabolic Panel:  Recent Labs Lab 12/27/14 1141 12/29/14 1153 12/30/14 0413 12/31/14 0416  NA 142 141 142 142  K 3.4* 3.4* 3.0* 3.6  CL 103 107 108 110  CO2 29 27 27 26   GLUCOSE 109* 93 108* 95  BUN 42* 13 12 12   CREATININE 0.94 0.83 0.78 0.93  CALCIUM 9.8 8.6 8.5 8.3*   Liver Function Tests:  Recent Labs Lab 12/27/14 1141  AST 19  ALT 15  ALKPHOS 56  BILITOT 0.8  PROT 5.9*  ALBUMIN 3.4*   CBC:  Recent Labs Lab 12/27/14 1141 12/27/14 1653 12/27/14 2307 12/28/14 1459 12/30/14 0413 12/31/14 0416  WBC 7.5  --   --   --  8.0 7.6  NEUTROABS 5.5  --   --   --   --   --   HGB 5.9* 8.7* 8.6* 8.6* 8.6* 8.4*  HCT 18.7* 26.8* 26.5* 26.4* 26.4* 26.1*  MCV 94.0  --   --   --  91.7 92.2  PLT 279  --   --   --  238 258   Cardiac Enzymes:  Recent Labs Lab 12/27/14 2307 12/28/14 0953  TROPONINI 0.21* 0.12*   Scheduled Meds: . atorvastatin  20 mg Oral QHS  . donepezil  5 mg Oral QHS  . loratadine  10 mg Oral Daily  . pantoprazole   40 mg Intravenous Q12H  . risperiDONE  0.5 mg Oral Q breakfast  . risperiDONE  1 mg Oral QHS  . sertraline  50 mg Oral Daily  . vitamin B-12  1,000 mcg Oral Daily   Continuous Infusions:

## 2015-01-01 DIAGNOSIS — K31811 Angiodysplasia of stomach and duodenum with bleeding: Principal | ICD-10-CM

## 2015-01-01 DIAGNOSIS — R7989 Other specified abnormal findings of blood chemistry: Secondary | ICD-10-CM

## 2015-01-01 DIAGNOSIS — L03115 Cellulitis of right lower limb: Secondary | ICD-10-CM | POA: Diagnosis present

## 2015-01-01 DIAGNOSIS — F329 Major depressive disorder, single episode, unspecified: Secondary | ICD-10-CM | POA: Diagnosis present

## 2015-01-01 DIAGNOSIS — D62 Acute posthemorrhagic anemia: Secondary | ICD-10-CM | POA: Diagnosis present

## 2015-01-01 DIAGNOSIS — E876 Hypokalemia: Secondary | ICD-10-CM | POA: Clinically undetermined

## 2015-01-01 DIAGNOSIS — F32A Depression, unspecified: Secondary | ICD-10-CM | POA: Diagnosis present

## 2015-01-01 DIAGNOSIS — R778 Other specified abnormalities of plasma proteins: Secondary | ICD-10-CM | POA: Diagnosis present

## 2015-01-01 LAB — CBC
HCT: 26.6 % — ABNORMAL LOW (ref 36.0–46.0)
Hemoglobin: 8.4 g/dL — ABNORMAL LOW (ref 12.0–15.0)
MCH: 28.8 pg (ref 26.0–34.0)
MCHC: 31.6 g/dL (ref 30.0–36.0)
MCV: 91.1 fL (ref 78.0–100.0)
Platelets: 248 10*3/uL (ref 150–400)
RBC: 2.92 MIL/uL — ABNORMAL LOW (ref 3.87–5.11)
RDW: 14.7 % (ref 11.5–15.5)
WBC: 6.9 10*3/uL (ref 4.0–10.5)

## 2015-01-01 LAB — URINE CULTURE
COLONY COUNT: NO GROWTH
CULTURE: NO GROWTH

## 2015-01-01 LAB — BASIC METABOLIC PANEL
Anion gap: 6 (ref 5–15)
BUN: 11 mg/dL (ref 6–20)
CALCIUM: 8.8 mg/dL — AB (ref 8.9–10.3)
CO2: 28 mmol/L (ref 22–32)
CREATININE: 0.99 mg/dL (ref 0.44–1.00)
Chloride: 112 mmol/L — ABNORMAL HIGH (ref 101–111)
GFR calc Af Amer: 59 mL/min — ABNORMAL LOW (ref >60)
GFR calc non Af Amer: 51 mL/min — ABNORMAL LOW (ref >60)
Glucose, Bld: 93 mg/dL (ref 70–99)
Potassium: 3.8 mmol/L (ref 3.5–5.1)
Sodium: 146 mmol/L — ABNORMAL HIGH (ref 135–145)

## 2015-01-01 MED ORDER — SULFAMETHOXAZOLE-TRIMETHOPRIM 800-160 MG PO TABS: 1.0000 | ORAL_TABLET | Freq: Two times a day (BID) | ORAL | Status: DC

## 2015-01-01 MED ORDER — HYDROCODONE-ACETAMINOPHEN 5-325 MG PO TABS: 1.0000 | ORAL_TABLET | ORAL | Status: DC | PRN

## 2015-01-01 MED ORDER — SENNA 8.6 MG PO TABS: 1.0000 | ORAL_TABLET | Freq: Every day | ORAL | Status: DC

## 2015-01-01 MED ORDER — POLYETHYLENE GLYCOL 3350 17 G PO PACK: 17.0000 g | PACK | Freq: Every day | ORAL | Status: DC | PRN

## 2015-01-01 MED ORDER — ASPIRIN EC 81 MG PO TBEC: 81.0000 mg | DELAYED_RELEASE_TABLET | Freq: Every day | ORAL | Status: DC

## 2015-01-01 MED ORDER — PANTOPRAZOLE SODIUM 40 MG PO TBEC: 40.0000 mg | DELAYED_RELEASE_TABLET | Freq: Two times a day (BID) | ORAL | Status: DC

## 2015-01-01 NOTE — Clinical Social Work Placement (Signed)
   CLINICAL SOCIAL WORK PLACEMENT  NOTE  Date:  01/01/2015  Patient Details  Name: Jamie [name] MRN: [id number] Date of Birth: [birth year]  Clinical Social Work is seeking post-discharge placement for this patient at the [place] level of care (*CSW will initial, date and re-position this form in  chart as items are completed):  No   Patient/family provided with [place] Work Department's list of facilities offering this level of care within the geographic area requested by the patient (or if unable, by the patient's family).  Yes   Patient/family informed of their freedom to choose among providers that offer the needed level of care, that participate in Medicare, Medicaid or managed care program needed by the patient, have an available bed and are willing to accept the patient.  No   Patient/family informed of [facility] [place]'s ownership interest in [facility] and [facility], as well as of the fact that they are under no obligation to receive care at these facilities.  PASRR submitted to EDS on  pasarr not needed as pt returning to [place] number received on       Existing PASRR number confirmed on       FL2 transmitted to all facilities in geographic area requested by pt/family on 01/01/15     FL2 transmitted to all facilities within larger geographic area on       Patient informed that his/her managed care company has contracts with or will negotiate with certain facilities, including the following:            Patient/family informed of bed offers received.  Patient chooses bed at       Physician recommends and patient chooses bed at      Patient to be transferred to [facility] on 01/01/15.  Patient to be transferred to facility by ambulance [facility])     Patient family notified on 01/01/15 of transfer.  Name of family member notified:  pt notified at bedside and pt guardian notified via telephone      PHYSICIAN Please sign FL2     Additional Comment: Pt assisted living, [facility] was able to accept pt back to facility and bed offers not needed to be given from SNF search.  _______________________________________________ Ladell Pier, LCSW 01/01/2015, 4:37 PM

## 2015-01-01 NOTE — Progress Notes (Signed)
Pt for discharge to [facility].   CSW facilitated pt discharge needs including contacting facility, faxing pt discharge information to facility and confirming facility received and reviewed d/c information, providing RN phone number to call report, discussing with pt at bedside, and arranging ambulance transport for pt back to [facility].   Pt expressed gratefulness that she was going to be able to return to [facility] today. Pt expressed that she has missed the facility and was anxious that she may have to stay in the hospital another night.   CSW left message with pt guardian, [name] to notify of discharge back to [facility] today.   No further social work needs identified at this time.  CSW signing off.   [name], MSW, [place] Work [phone]

## 2015-01-01 NOTE — Progress Notes (Addendum)
CSW continuing to follow.   CSW contacted pt ALF, [facility] this morning and left message with facility RN in order to have someone from ALF come to hospital to evaluate pt for appropriateness for return to facility.  CSW received phone call from pt guardian, Jamie [name] [phone]). Pt guardian stated that preference would be return to [facility], but agreeable to SNF search for options if [facility] is unable to manage pt needs at this time.   CSW updated FL2 and initiated SNF search to [facility].  CSW to follow up with pt guardian regarding [facility] determination if facility can continue to meet pt needs vs provided SNF bed offers for short term rehab.  CSW to continue to follow.   Addendum 11:30 am:  CSW continuing to follow.   CSW met with [facility] RN on nursing unit and ALF RN evaluated pt to determine if ALF can continue to meet pt needs at ALF level of care and pt can return to [facility] when medically stable for d/c.   CSW notified MD who states that he has not yet assessed pt to determine if pt medically ready for d/c today.   CSW contacted pt guardian, Jamie [name] ([phone]) to notify that [facility]F feels that they can continue to meet pt needs and is agreeable to pt returning to [facility].   CSW to continue to follow and facilitate pt discharge needs to [facility] when medically stable for discharge.  [name], MSW, [place] Work [phone]

## 2015-01-01 NOTE — Care Management Note (Addendum)
Case Management Note  Patient Details  Name: LORICE LAFAVE MRN: [id number] Date of Birth: 29-Jun-1930  Subjective/Objective:                    Action/Plan:   Expected Discharge Date:   (UNKNOWN)               Expected Discharge Plan:  Assisted Living / Rest Home  In-House Referral:  Clinical Social Work  Discharge planning Services  CM Consult  Post Acute Care Choice:    Choice offered to:     DME Arranged:  [place] DME Agency:  [place]:    [place]:     Status of Service:     Medicare Important Message Given:  Yes Date Medicare IM Given:  01/01/15 Medicare IM give by:  Marney Doctor RN Date Additional Medicare IM Given:    Additional Medicare Important Message give by:     If discussed at [place] of Stay Meetings, dates discussed:    Additional Comments: 01/01/15 Marney Doctor RN,BSN,NCM  Order for RW at DC. [place] DME rep given referral and instructed to have it delivered to [facility]. Per SW, [facility] have their own HHPT/OT that they use for their facility [facility]). SW to send HHPT/OT orders with pt. [facility] rep called to give referral.  Lynnell Catalan, RN 01/01/2015, 2:43 PM

## 2015-01-01 NOTE — Progress Notes (Signed)
Report called to [facility] at [facility].

## 2015-01-01 NOTE — Discharge Summary (Signed)
Physician Discharge Summary  Jamie [name]:[id number] DOB: 1929/10/11 DOA: 12/27/2014  PCP: Kennyth Arnold, FNP  Admit date: 12/27/2014 Discharge date: 01/01/2015  Time spent: 70 minutes  Recommendations for Outpatient Follow-up:  1. Follow-up with Kennyth Arnold, FNP in 1 week. I'll follow up patient in need a CBC done to follow-up on her hemoglobin. Patient need a basic metabolic profile done to follow-up on electrolytes and renal function. Patient will also need a 2-D echo done to further investigate cardiac murmur which was heard on day of discharge.  Discharge Diagnoses:  Principal Problem:   GI bleed Active Problems:   Acute blood loss anemia   Hyperlipemia   Essential hypertension   Dementia with behavioral disturbance   Cellulitis of leg, right   Elevated troponin   Hypokalemia   Depression   Discharge Condition: Stable and improved  Diet recommendation: regular, soft  Filed Weights   12/28/14 1210  Weight: 81.3 kg (179 lb 3.7 oz)    History of present illness:  Per Dr Waldron Labs  Jamie [name] is a 81 y.o. female, with past medical history of obesity, hypertension, hyperlipidemia, chronic pain, depression, dementia, anxiety, patient presented with weakness, for the last few days, patient was noticed to be hypotensive in ED, with systolic blood pressure in the 70s and 80s, workup was significant for anemia with hemoglobin of 5.9, her recent hemoglobin in January 2016 was 15.9, patient was noticed to have melena on physical exam by ED physician, and reported having melena over the last 2 weeks prior to admission, had EGD in 2013, showed [name]'s erosions; gastritis and 2 duodenal AVMs. Colonoscopy in 2012 showed 2 small polyps and diverticulosis,EGD in 2012 showed a gastric ulcer, patient responded to volume resuscitation, she received 2 units packed red blood cells, most recent blood pressure 104/64, patient denied any NSAIDs use, had mildly elevated troponins at 0.12, denied  any chest pain or shortness of breath, ED requested to admit the patient for further management for GI bleed  Hospital Course:  #1 upper GI bleed/hemorrhagic shock Patient had presented with symptomatic anemia with a hemoglobin of 5.9 and noted to be hypotensive. Patient was admitted placed on IV fluids placed on a Protonix drip and patient transfused a total of 3 units of packed red blood cells. GI was consulted and patient was seen in consultation by Dr. Michail Sermon. Patient was noted to have melanotic stools and felt to likely have a upper GI bleed versus bleeding AVMs. Patient was resuscitated aggressively with IV fluids. Patient subsequently went on upper endoscopy on 12/28/2014 which showed bleeding duodenal AVMs. Patient subsequently went ablation of AVM with APC and hemostasis was achieved. Patient was subsequently monitored. Patient's blood pressure improved. Patient's hemoglobin improved and stabilized at 8.4. Patient was subsequent to transition from a Protonix drip to IV tonics. Patient's diet was slowly advanced which she tolerated. Patient did not have any further melanotic stools. And patient be discharged in stable and improved condition. Patient be discharged on Protonix 40 mg twice daily for 1 month and then daily thereafter. Patient will need repeat lab work done in 1 week to follow-up on a hemoglobin. Patient needs to follow-up with PCP as outpatient.  #2 acute blood loss anemia Patient had presented with melanotic stools noted to have a hemoglobin of 5.9. Patient was transfused total of 3 units packed red blood cells. Patient's hemoglobin improved and stabilized at 8.4. Patient underwent an upper endoscopy and was noted to have a bleeding duodenal AVMs status  post ablation. Patient's hemoglobin stabilized patient did not have any further bleeding. Patient was maintained on a PPI during the hospitalization number discharge on a PPI. Patient be discharged in stable and improved  condition.  #3 right ankle cellulitis During the hospitalization patient was noted to have a cellulitis. X-rays of the right ankle were done which were negative for any fracture or dislocation area patient was placed on Bactrim with significant clinical improvement. Patient be discharged on 4 more days of oral Bactrim to complete a one-week course of antibiotic therapy. Outpatient follow-up.  #4 elevated troponins Patient was noted to have elevated troponins which plateaued. Patient denied any chest pain. It was felt this was secondary to demand ischemia. Outpatient follow-up.  #5 cardiac murmur On day of discharge patient was noted to have a murmur. Patient denied any chest pain. Patient will likely need a 2-D echo done as outpatient for further evaluation.  #6 hypertension Patient on admission was noted to be hypotensive and a such a blood pressure medications were initially held. Patient's blood pressure improved. Patient was started back on her home regimen of lisinopril. Patient's diuretic was held. Outpatient follow-up.  The rest of patient's chronic medical issues remained stable throughout the hospitalization and patient be discharged in stable and improved condition.  Procedures:  Chest x-ray 12/27/2014  X-ray right ankle 12/30/2014   upper endoscopy 12/28/2014 per Dr. Michail Sermon  Consultations:  Gastroenterology: Dr. Michail Sermon 12/27/2014  Discharge Exam: Filed Vitals:   01/01/15 0440  BP: 139/62  Pulse: 81  Temp: 98.1 F (36.7 C)  Resp: 16    General: NAD Cardiovascular: RRR with 3/6 SEM Respiratory: CTAB Ext RLE with decreased erythema, decreased warmth, tender to palpation.  Discharge Instructions   Discharge Instructions    Diet general    Complete by:  As directed      Discharge instructions    Complete by:  As directed   Follow up with Kennyth Arnold, FNP in 1 week.     Increase activity slowly    Complete by:  As directed           Current  Discharge Medication List    START taking these medications   Details  HYDROcodone-acetaminophen (NORCO/VICODIN) 5-325 MG per tablet Take 1 tablet by mouth every 4 (four) hours as needed for moderate pain. Qty: 15 tablet, Refills: 0    pantoprazole (PROTONIX) 40 MG tablet Take 1 tablet (40 mg total) by mouth 2 (two) times daily before a meal. Take 1 tablet 2 times daily x 4 weeks, then 1 tablet daily. Qty: 90 tablet, Refills: 0    senna (SENOKOT) 8.6 MG TABS tablet Take 1 tablet (8.6 mg total) by mouth at bedtime. Qty: 120 each, Refills: 0    sulfamethoxazole-trimethoprim (BACTRIM DS,SEPTRA DS) 800-160 MG per tablet Take 1 tablet by mouth every 12 (twelve) hours. Take for 4 days then stop. Qty: 8 tablet, Refills: 0      CONTINUE these medications which have CHANGED   Details  aspirin EC 81 MG tablet Take 1 tablet (81 mg total) by mouth daily with breakfast. Resume in 2 weeks.      CONTINUE these medications which have NOT CHANGED   Details  atorvastatin (LIPITOR) 20 MG tablet Take 20 mg by mouth at bedtime.    Calcium Carbonate-Vitamin D 600-400 MG-UNIT per tablet Take 1 tablet by mouth daily with breakfast.    Cholecalciferol (VITAMIN D) 2000 UNITS tablet Take 2,000 Units by mouth daily.  donepezil (ARICEPT) 5 MG tablet Take 1 tablet (5 mg total) by mouth at bedtime. Qty: 30 tablet, Refills: 3    fluticasone (FLONASE) 50 MCG/ACT nasal spray Place 2 sprays into both nostrils daily.    gabapentin (NEURONTIN) 100 MG capsule Take 100 mg by mouth at bedtime.    hydroxypropyl methylcellulose / hypromellose (ISOPTO TEARS / GONIOVISC) 2.5 % ophthalmic solution Place 1 drop into both eyes every 2 (two) hours as needed for dry eyes.    lisinopril (PRINIVIL,ZESTRIL) 10 MG tablet Take 10 mg by mouth daily with breakfast.    loratadine (CLARITIN) 10 MG tablet Take 10 mg by mouth daily.    Melatonin 1 MG TABS Take 1 tablet by mouth at bedtime.    Multiple Vitamins-Minerals  (THERA-TABS M) TABS Take 1 tablet by mouth daily with breakfast.    polyethylene glycol (MIRALAX / GLYCOLAX) packet Take 17 g by mouth daily.    !! risperiDONE (RISPERDAL) 0.5 MG tablet Take 0.5 mg by mouth daily with breakfast.    !! risperiDONE (RISPERDAL) 1 MG tablet Take 1 tablet (1 mg total) by mouth at bedtime. Qty: 30 tablet, Refills: 3    sertraline (ZOLOFT) 50 MG tablet Take 50 mg by mouth daily.    vitamin B-12 (CYANOCOBALAMIN) 500 MCG tablet Take 1,000 mcg by mouth daily.      !! - Potential duplicate medications found. Please discuss with provider.    STOP taking these medications     chlorthalidone (HYGROTON) 25 MG tablet        Allergies  Allergen Reactions  . Pollen Extract Other (See Comments)    Swollen Eyes & Runny Nose.   Follow-up Information    Follow up with Kennyth Arnold, FNP. Schedule an appointment as soon as possible for a visit in 1 week.   Specialty:  Family Medicine   Contact information:   [place] [address] [phone]        The results of significant diagnostics from this hospitalization (including imaging, microbiology, ancillary and laboratory) are listed below for reference.    Significant Diagnostic Studies: Dg Ankle Complete Right  12/30/2014   CLINICAL DATA:  Medial ankle pain swelling and redness. No known injury. Initial encounter.  EXAM: RIGHT ANKLE - COMPLETE 3+ VIEW  COMPARISON:  None.  FINDINGS: There is apparent mild soft tissue swelling about the medial malleolus. This finding is without associated displaced fracture or dislocation. Joint spaces are preserved. The ankle mortise is preserved given obliquity. Incidental note is made of a small os peroneus. Adjacent vascular calcifications. No definite ankle joint effusion. No radiopaque foreign body.  IMPRESSION: Mild soft tissue swelling about the medial malleolus without associated fracture or dislocation.   Electronically Signed   By: Sandi Mariscal M.D.    On: 12/30/2014 10:44   Ct Head Wo Contrast  12/27/2014   CLINICAL DATA:  Altered mental status and gait disturbance  EXAM: CT HEAD WITHOUT CONTRAST  TECHNIQUE: Contiguous axial images were obtained from the base of the skull through the vertex without intravenous contrast.  COMPARISON:  November 26, 2013  FINDINGS: There is mild diffuse atrophy. There is no intracranial mass, hemorrhage, extra-axial fluid collection, or midline shift. There is patchy small vessel disease in the centra semiovale bilaterally, stable. No new gray-white compartment lesions are identified. No acute infarct apparent. Bony calvarium appears intact. The mastoid air cells are clear. There is debris in each external auditory canal.  IMPRESSION: Atrophy with small vessel disease  in the periventricular white matter bilaterally. No intracranial mass, hemorrhage, or acute appearing infarct. Probable cerumen in each external auditory canal.   Electronically Signed   By: Lowella Grip III M.D.   On: 12/27/2014 12:18   Dg Chest Portable 1 View  12/27/2014   CLINICAL DATA:  Generalize weakness.  EXAM: PORTABLE CHEST - 1 VIEW  COMPARISON:  09/29/2014  FINDINGS: The patient is mildly rotated to the right, partially limiting evaluation of the mediastinum. Cardiomediastinal silhouette is grossly unchanged with evidence of tortuosity and likely ectasia of the thoracic aorta. Moderate-sized hiatal hernia is again seen. No airspace consolidation, edema, pleural effusion, or pneumothorax is identified. No acute osseous abnormality is seen.  IMPRESSION: No active disease.   Electronically Signed   By: Logan Bores   On: 12/27/2014 12:14    Microbiology: Recent Results (from the past 240 hour(s))  MRSA PCR Screening     Status: None   Collection Time: 12/28/14 10:34 AM  Result Value Ref Range Status   MRSA by PCR NEGATIVE NEGATIVE Final    Comment:        The GeneXpert MRSA Assay (FDA approved for NASAL specimens only), is one component of  a comprehensive MRSA colonization surveillance program. It is not intended to diagnose MRSA infection nor to guide or monitor treatment for MRSA infections.      Labs: Basic Metabolic Panel:  Recent Labs Lab 12/27/14 1141 12/29/14 1153 12/30/14 0413 12/31/14 0416 01/01/15 0429  NA 142 141 142 142 146*  K 3.4* 3.4* 3.0* 3.6 3.8  CL 103 107 108 110 112*  CO2 29 27 27 26 28   GLUCOSE 109* 93 108* 95 93  BUN 42* 13 12 12 11   CREATININE 0.94 0.83 0.78 0.93 0.99  CALCIUM 9.8 8.6 8.5 8.3* 8.8*   Liver Function Tests:  Recent Labs Lab 12/27/14 1141  AST 19  ALT 15  ALKPHOS 56  BILITOT 0.8  PROT 5.9*  ALBUMIN 3.4*   No results for input(s): LIPASE, AMYLASE in the last 168 hours. No results for input(s): AMMONIA in the last 168 hours. CBC:  Recent Labs Lab 12/27/14 1141  12/27/14 2307 12/28/14 1459 12/30/14 0413 12/31/14 0416 01/01/15 0429  WBC 7.5  --   --   --  8.0 7.6 6.9  NEUTROABS 5.5  --   --   --   --   --   --   HGB 5.9*  < > 8.6* 8.6* 8.6* 8.4* 8.4*  HCT 18.7*  < > 26.5* 26.4* 26.4* 26.1* 26.6*  MCV 94.0  --   --   --  91.7 92.2 91.1  PLT 279  --   --   --  238 258 248  < > = values in this interval not displayed. Cardiac Enzymes:  Recent Labs Lab 12/27/14 2307 12/28/14 0953  TROPONINI 0.21* 0.12*   BNP: BNP (last 3 results) No results for input(s): BNP in the last 8760 hours.  ProBNP (last 3 results) No results for input(s): PROBNP in the last 8760 hours.  CBG: No results for input(s): GLUCAP in the last 168 hours.     SignedIrine Seal MD [facility] 01/01/2015, 12:52 PM

## 2015-01-02 DIAGNOSIS — R609 Edema, unspecified: Secondary | ICD-10-CM | POA: Diagnosis not present

## 2015-01-02 DIAGNOSIS — G3184 Mild cognitive impairment, so stated: Secondary | ICD-10-CM | POA: Diagnosis not present

## 2015-01-02 DIAGNOSIS — B462 Gastrointestinal mucormycosis: Secondary | ICD-10-CM | POA: Diagnosis not present

## 2015-01-02 DIAGNOSIS — R319 Hematuria, unspecified: Secondary | ICD-10-CM | POA: Diagnosis not present

## 2015-01-02 DIAGNOSIS — I10 Essential (primary) hypertension: Secondary | ICD-10-CM | POA: Diagnosis not present

## 2015-01-03 DIAGNOSIS — D649 Anemia, unspecified: Secondary | ICD-10-CM | POA: Diagnosis not present

## 2015-01-03 DIAGNOSIS — F0391 Unspecified dementia with behavioral disturbance: Secondary | ICD-10-CM | POA: Diagnosis not present

## 2015-01-03 DIAGNOSIS — E669 Obesity, unspecified: Secondary | ICD-10-CM | POA: Diagnosis not present

## 2015-01-03 DIAGNOSIS — K2971 Gastritis, unspecified, with bleeding: Secondary | ICD-10-CM | POA: Diagnosis not present

## 2015-01-03 DIAGNOSIS — E876 Hypokalemia: Secondary | ICD-10-CM | POA: Diagnosis not present

## 2015-01-03 DIAGNOSIS — M6281 Muscle weakness (generalized): Secondary | ICD-10-CM | POA: Diagnosis not present

## 2015-01-03 DIAGNOSIS — F419 Anxiety disorder, unspecified: Secondary | ICD-10-CM | POA: Diagnosis not present

## 2015-01-03 DIAGNOSIS — F339 Major depressive disorder, recurrent, unspecified: Secondary | ICD-10-CM | POA: Diagnosis not present

## 2015-01-03 DIAGNOSIS — I10 Essential (primary) hypertension: Secondary | ICD-10-CM | POA: Diagnosis not present

## 2015-01-04 DIAGNOSIS — F419 Anxiety disorder, unspecified: Secondary | ICD-10-CM | POA: Diagnosis not present

## 2015-01-04 DIAGNOSIS — M6281 Muscle weakness (generalized): Secondary | ICD-10-CM | POA: Diagnosis not present

## 2015-01-04 DIAGNOSIS — K2971 Gastritis, unspecified, with bleeding: Secondary | ICD-10-CM | POA: Diagnosis not present

## 2015-01-04 DIAGNOSIS — I10 Essential (primary) hypertension: Secondary | ICD-10-CM | POA: Diagnosis not present

## 2015-01-04 DIAGNOSIS — E876 Hypokalemia: Secondary | ICD-10-CM | POA: Diagnosis not present

## 2015-01-04 DIAGNOSIS — F0391 Unspecified dementia with behavioral disturbance: Secondary | ICD-10-CM | POA: Diagnosis not present

## 2015-01-04 DIAGNOSIS — D649 Anemia, unspecified: Secondary | ICD-10-CM | POA: Diagnosis not present

## 2015-01-04 DIAGNOSIS — E669 Obesity, unspecified: Secondary | ICD-10-CM | POA: Diagnosis not present

## 2015-01-04 DIAGNOSIS — F339 Major depressive disorder, recurrent, unspecified: Secondary | ICD-10-CM | POA: Diagnosis not present

## 2015-01-05 ENCOUNTER — Telehealth: Payer: Self-pay | Admitting: Family

## 2015-01-05 NOTE — Telephone Encounter (Signed)
[facility] with [facility] called to say that he will see pt  1 week 1 2 week 2 1week 1

## 2015-01-06 DIAGNOSIS — Z79899 Other long term (current) drug therapy: Secondary | ICD-10-CM | POA: Diagnosis not present

## 2015-01-06 DIAGNOSIS — D649 Anemia, unspecified: Secondary | ICD-10-CM | POA: Diagnosis not present

## 2015-01-06 DIAGNOSIS — R6889 Other general symptoms and signs: Secondary | ICD-10-CM | POA: Diagnosis not present

## 2015-01-08 DIAGNOSIS — D649 Anemia, unspecified: Secondary | ICD-10-CM | POA: Diagnosis not present

## 2015-01-08 DIAGNOSIS — F339 Major depressive disorder, recurrent, unspecified: Secondary | ICD-10-CM | POA: Diagnosis not present

## 2015-01-08 DIAGNOSIS — F419 Anxiety disorder, unspecified: Secondary | ICD-10-CM | POA: Diagnosis not present

## 2015-01-08 DIAGNOSIS — E876 Hypokalemia: Secondary | ICD-10-CM | POA: Diagnosis not present

## 2015-01-08 DIAGNOSIS — F0391 Unspecified dementia with behavioral disturbance: Secondary | ICD-10-CM | POA: Diagnosis not present

## 2015-01-08 DIAGNOSIS — I10 Essential (primary) hypertension: Secondary | ICD-10-CM | POA: Diagnosis not present

## 2015-01-08 DIAGNOSIS — M6281 Muscle weakness (generalized): Secondary | ICD-10-CM | POA: Diagnosis not present

## 2015-01-08 DIAGNOSIS — E669 Obesity, unspecified: Secondary | ICD-10-CM | POA: Diagnosis not present

## 2015-01-08 DIAGNOSIS — K2971 Gastritis, unspecified, with bleeding: Secondary | ICD-10-CM | POA: Diagnosis not present

## 2015-01-08 NOTE — Telephone Encounter (Signed)
Clair Gulling will fax order to be signed

## 2015-01-11 DIAGNOSIS — K2971 Gastritis, unspecified, with bleeding: Secondary | ICD-10-CM | POA: Diagnosis not present

## 2015-01-11 DIAGNOSIS — M6281 Muscle weakness (generalized): Secondary | ICD-10-CM | POA: Diagnosis not present

## 2015-01-11 DIAGNOSIS — D649 Anemia, unspecified: Secondary | ICD-10-CM | POA: Diagnosis not present

## 2015-01-11 DIAGNOSIS — E669 Obesity, unspecified: Secondary | ICD-10-CM | POA: Diagnosis not present

## 2015-01-11 DIAGNOSIS — F419 Anxiety disorder, unspecified: Secondary | ICD-10-CM | POA: Diagnosis not present

## 2015-01-11 DIAGNOSIS — I10 Essential (primary) hypertension: Secondary | ICD-10-CM | POA: Diagnosis not present

## 2015-01-11 DIAGNOSIS — F0391 Unspecified dementia with behavioral disturbance: Secondary | ICD-10-CM | POA: Diagnosis not present

## 2015-01-11 DIAGNOSIS — F339 Major depressive disorder, recurrent, unspecified: Secondary | ICD-10-CM | POA: Diagnosis not present

## 2015-01-11 DIAGNOSIS — E876 Hypokalemia: Secondary | ICD-10-CM | POA: Diagnosis not present

## 2015-01-12 DIAGNOSIS — K2971 Gastritis, unspecified, with bleeding: Secondary | ICD-10-CM | POA: Diagnosis not present

## 2015-01-12 DIAGNOSIS — D649 Anemia, unspecified: Secondary | ICD-10-CM | POA: Diagnosis not present

## 2015-01-12 DIAGNOSIS — M6281 Muscle weakness (generalized): Secondary | ICD-10-CM | POA: Diagnosis not present

## 2015-01-12 DIAGNOSIS — I10 Essential (primary) hypertension: Secondary | ICD-10-CM | POA: Diagnosis not present

## 2015-01-12 DIAGNOSIS — E669 Obesity, unspecified: Secondary | ICD-10-CM | POA: Diagnosis not present

## 2015-01-12 DIAGNOSIS — F0391 Unspecified dementia with behavioral disturbance: Secondary | ICD-10-CM | POA: Diagnosis not present

## 2015-01-12 DIAGNOSIS — F339 Major depressive disorder, recurrent, unspecified: Secondary | ICD-10-CM | POA: Diagnosis not present

## 2015-01-12 DIAGNOSIS — E876 Hypokalemia: Secondary | ICD-10-CM | POA: Diagnosis not present

## 2015-01-12 DIAGNOSIS — F419 Anxiety disorder, unspecified: Secondary | ICD-10-CM | POA: Diagnosis not present

## 2015-01-15 DIAGNOSIS — K2971 Gastritis, unspecified, with bleeding: Secondary | ICD-10-CM | POA: Diagnosis not present

## 2015-01-15 DIAGNOSIS — D649 Anemia, unspecified: Secondary | ICD-10-CM | POA: Diagnosis not present

## 2015-01-15 DIAGNOSIS — E876 Hypokalemia: Secondary | ICD-10-CM | POA: Diagnosis not present

## 2015-01-15 DIAGNOSIS — M6281 Muscle weakness (generalized): Secondary | ICD-10-CM | POA: Diagnosis not present

## 2015-01-15 DIAGNOSIS — F0391 Unspecified dementia with behavioral disturbance: Secondary | ICD-10-CM | POA: Diagnosis not present

## 2015-01-15 DIAGNOSIS — F419 Anxiety disorder, unspecified: Secondary | ICD-10-CM | POA: Diagnosis not present

## 2015-01-15 DIAGNOSIS — I10 Essential (primary) hypertension: Secondary | ICD-10-CM | POA: Diagnosis not present

## 2015-01-15 DIAGNOSIS — E669 Obesity, unspecified: Secondary | ICD-10-CM | POA: Diagnosis not present

## 2015-01-15 DIAGNOSIS — F339 Major depressive disorder, recurrent, unspecified: Secondary | ICD-10-CM | POA: Diagnosis not present

## 2015-01-16 DIAGNOSIS — F339 Major depressive disorder, recurrent, unspecified: Secondary | ICD-10-CM | POA: Diagnosis not present

## 2015-01-16 DIAGNOSIS — M6281 Muscle weakness (generalized): Secondary | ICD-10-CM | POA: Diagnosis not present

## 2015-01-16 DIAGNOSIS — K2971 Gastritis, unspecified, with bleeding: Secondary | ICD-10-CM | POA: Diagnosis not present

## 2015-01-16 DIAGNOSIS — E669 Obesity, unspecified: Secondary | ICD-10-CM | POA: Diagnosis not present

## 2015-01-16 DIAGNOSIS — I10 Essential (primary) hypertension: Secondary | ICD-10-CM | POA: Diagnosis not present

## 2015-01-16 DIAGNOSIS — D649 Anemia, unspecified: Secondary | ICD-10-CM | POA: Diagnosis not present

## 2015-01-16 DIAGNOSIS — F419 Anxiety disorder, unspecified: Secondary | ICD-10-CM | POA: Diagnosis not present

## 2015-01-16 DIAGNOSIS — E876 Hypokalemia: Secondary | ICD-10-CM | POA: Diagnosis not present

## 2015-01-16 DIAGNOSIS — F0391 Unspecified dementia with behavioral disturbance: Secondary | ICD-10-CM | POA: Diagnosis not present

## 2015-01-17 DIAGNOSIS — E876 Hypokalemia: Secondary | ICD-10-CM | POA: Diagnosis not present

## 2015-01-17 DIAGNOSIS — I10 Essential (primary) hypertension: Secondary | ICD-10-CM | POA: Diagnosis not present

## 2015-01-17 DIAGNOSIS — H1045 Other chronic allergic conjunctivitis: Secondary | ICD-10-CM | POA: Diagnosis not present

## 2015-01-17 DIAGNOSIS — G3184 Mild cognitive impairment, so stated: Secondary | ICD-10-CM | POA: Diagnosis not present

## 2015-01-17 DIAGNOSIS — K2971 Gastritis, unspecified, with bleeding: Secondary | ICD-10-CM | POA: Diagnosis not present

## 2015-01-17 DIAGNOSIS — F0391 Unspecified dementia with behavioral disturbance: Secondary | ICD-10-CM | POA: Diagnosis not present

## 2015-01-17 DIAGNOSIS — D509 Iron deficiency anemia, unspecified: Secondary | ICD-10-CM | POA: Diagnosis not present

## 2015-01-17 DIAGNOSIS — F251 Schizoaffective disorder, depressive type: Secondary | ICD-10-CM | POA: Diagnosis not present

## 2015-01-17 DIAGNOSIS — F339 Major depressive disorder, recurrent, unspecified: Secondary | ICD-10-CM | POA: Diagnosis not present

## 2015-01-17 DIAGNOSIS — M6281 Muscle weakness (generalized): Secondary | ICD-10-CM | POA: Diagnosis not present

## 2015-01-17 DIAGNOSIS — H6123 Impacted cerumen, bilateral: Secondary | ICD-10-CM | POA: Diagnosis not present

## 2015-01-17 DIAGNOSIS — F419 Anxiety disorder, unspecified: Secondary | ICD-10-CM | POA: Diagnosis not present

## 2015-01-17 DIAGNOSIS — E669 Obesity, unspecified: Secondary | ICD-10-CM | POA: Diagnosis not present

## 2015-01-17 DIAGNOSIS — D649 Anemia, unspecified: Secondary | ICD-10-CM | POA: Diagnosis not present

## 2015-01-18 DIAGNOSIS — F339 Major depressive disorder, recurrent, unspecified: Secondary | ICD-10-CM | POA: Diagnosis not present

## 2015-01-18 DIAGNOSIS — F0391 Unspecified dementia with behavioral disturbance: Secondary | ICD-10-CM | POA: Diagnosis not present

## 2015-01-18 DIAGNOSIS — E876 Hypokalemia: Secondary | ICD-10-CM | POA: Diagnosis not present

## 2015-01-18 DIAGNOSIS — M6281 Muscle weakness (generalized): Secondary | ICD-10-CM | POA: Diagnosis not present

## 2015-01-18 DIAGNOSIS — F419 Anxiety disorder, unspecified: Secondary | ICD-10-CM | POA: Diagnosis not present

## 2015-01-18 DIAGNOSIS — I10 Essential (primary) hypertension: Secondary | ICD-10-CM | POA: Diagnosis not present

## 2015-01-18 DIAGNOSIS — K2971 Gastritis, unspecified, with bleeding: Secondary | ICD-10-CM | POA: Diagnosis not present

## 2015-01-18 DIAGNOSIS — D649 Anemia, unspecified: Secondary | ICD-10-CM | POA: Diagnosis not present

## 2015-01-18 DIAGNOSIS — E669 Obesity, unspecified: Secondary | ICD-10-CM | POA: Diagnosis not present

## 2015-01-23 DIAGNOSIS — I10 Essential (primary) hypertension: Secondary | ICD-10-CM | POA: Diagnosis not present

## 2015-01-23 DIAGNOSIS — D649 Anemia, unspecified: Secondary | ICD-10-CM | POA: Diagnosis not present

## 2015-01-23 DIAGNOSIS — F419 Anxiety disorder, unspecified: Secondary | ICD-10-CM | POA: Diagnosis not present

## 2015-01-23 DIAGNOSIS — E876 Hypokalemia: Secondary | ICD-10-CM | POA: Diagnosis not present

## 2015-01-23 DIAGNOSIS — M6281 Muscle weakness (generalized): Secondary | ICD-10-CM | POA: Diagnosis not present

## 2015-01-23 DIAGNOSIS — K2971 Gastritis, unspecified, with bleeding: Secondary | ICD-10-CM | POA: Diagnosis not present

## 2015-01-23 DIAGNOSIS — F339 Major depressive disorder, recurrent, unspecified: Secondary | ICD-10-CM | POA: Diagnosis not present

## 2015-01-23 DIAGNOSIS — F0391 Unspecified dementia with behavioral disturbance: Secondary | ICD-10-CM | POA: Diagnosis not present

## 2015-01-23 DIAGNOSIS — E669 Obesity, unspecified: Secondary | ICD-10-CM | POA: Diagnosis not present

## 2015-01-25 DIAGNOSIS — I10 Essential (primary) hypertension: Secondary | ICD-10-CM | POA: Diagnosis not present

## 2015-01-25 DIAGNOSIS — K2971 Gastritis, unspecified, with bleeding: Secondary | ICD-10-CM | POA: Diagnosis not present

## 2015-01-25 DIAGNOSIS — E876 Hypokalemia: Secondary | ICD-10-CM | POA: Diagnosis not present

## 2015-01-25 DIAGNOSIS — D649 Anemia, unspecified: Secondary | ICD-10-CM | POA: Diagnosis not present

## 2015-01-25 DIAGNOSIS — F0391 Unspecified dementia with behavioral disturbance: Secondary | ICD-10-CM | POA: Diagnosis not present

## 2015-01-25 DIAGNOSIS — F339 Major depressive disorder, recurrent, unspecified: Secondary | ICD-10-CM | POA: Diagnosis not present

## 2015-01-25 DIAGNOSIS — E669 Obesity, unspecified: Secondary | ICD-10-CM | POA: Diagnosis not present

## 2015-01-25 DIAGNOSIS — M6281 Muscle weakness (generalized): Secondary | ICD-10-CM | POA: Diagnosis not present

## 2015-01-25 DIAGNOSIS — F419 Anxiety disorder, unspecified: Secondary | ICD-10-CM | POA: Diagnosis not present

## 2015-01-26 DIAGNOSIS — E876 Hypokalemia: Secondary | ICD-10-CM | POA: Diagnosis not present

## 2015-01-26 DIAGNOSIS — M6281 Muscle weakness (generalized): Secondary | ICD-10-CM | POA: Diagnosis not present

## 2015-01-26 DIAGNOSIS — D649 Anemia, unspecified: Secondary | ICD-10-CM | POA: Diagnosis not present

## 2015-01-26 DIAGNOSIS — I10 Essential (primary) hypertension: Secondary | ICD-10-CM | POA: Diagnosis not present

## 2015-01-26 DIAGNOSIS — F419 Anxiety disorder, unspecified: Secondary | ICD-10-CM | POA: Diagnosis not present

## 2015-01-26 DIAGNOSIS — E669 Obesity, unspecified: Secondary | ICD-10-CM | POA: Diagnosis not present

## 2015-01-26 DIAGNOSIS — K2971 Gastritis, unspecified, with bleeding: Secondary | ICD-10-CM | POA: Diagnosis not present

## 2015-01-26 DIAGNOSIS — F339 Major depressive disorder, recurrent, unspecified: Secondary | ICD-10-CM | POA: Diagnosis not present

## 2015-01-26 DIAGNOSIS — F0391 Unspecified dementia with behavioral disturbance: Secondary | ICD-10-CM | POA: Diagnosis not present

## 2015-01-30 DIAGNOSIS — K2971 Gastritis, unspecified, with bleeding: Secondary | ICD-10-CM | POA: Diagnosis not present

## 2015-01-30 DIAGNOSIS — D509 Iron deficiency anemia, unspecified: Secondary | ICD-10-CM | POA: Diagnosis not present

## 2015-01-30 DIAGNOSIS — D649 Anemia, unspecified: Secondary | ICD-10-CM | POA: Diagnosis not present

## 2015-01-30 DIAGNOSIS — R609 Edema, unspecified: Secondary | ICD-10-CM | POA: Diagnosis not present

## 2015-01-30 DIAGNOSIS — F0391 Unspecified dementia with behavioral disturbance: Secondary | ICD-10-CM | POA: Diagnosis not present

## 2015-01-30 DIAGNOSIS — M6281 Muscle weakness (generalized): Secondary | ICD-10-CM | POA: Diagnosis not present

## 2015-01-30 DIAGNOSIS — H1045 Other chronic allergic conjunctivitis: Secondary | ICD-10-CM | POA: Diagnosis not present

## 2015-01-30 DIAGNOSIS — I10 Essential (primary) hypertension: Secondary | ICD-10-CM | POA: Diagnosis not present

## 2015-01-30 DIAGNOSIS — F339 Major depressive disorder, recurrent, unspecified: Secondary | ICD-10-CM | POA: Diagnosis not present

## 2015-01-30 DIAGNOSIS — F419 Anxiety disorder, unspecified: Secondary | ICD-10-CM | POA: Diagnosis not present

## 2015-01-30 DIAGNOSIS — H6123 Impacted cerumen, bilateral: Secondary | ICD-10-CM | POA: Diagnosis not present

## 2015-01-30 DIAGNOSIS — F251 Schizoaffective disorder, depressive type: Secondary | ICD-10-CM | POA: Diagnosis not present

## 2015-01-30 DIAGNOSIS — E669 Obesity, unspecified: Secondary | ICD-10-CM | POA: Diagnosis not present

## 2015-01-30 DIAGNOSIS — E876 Hypokalemia: Secondary | ICD-10-CM | POA: Diagnosis not present

## 2015-02-01 DIAGNOSIS — F419 Anxiety disorder, unspecified: Secondary | ICD-10-CM | POA: Diagnosis not present

## 2015-02-01 DIAGNOSIS — F0391 Unspecified dementia with behavioral disturbance: Secondary | ICD-10-CM | POA: Diagnosis not present

## 2015-02-01 DIAGNOSIS — M6281 Muscle weakness (generalized): Secondary | ICD-10-CM | POA: Diagnosis not present

## 2015-02-01 DIAGNOSIS — I10 Essential (primary) hypertension: Secondary | ICD-10-CM | POA: Diagnosis not present

## 2015-02-01 DIAGNOSIS — F339 Major depressive disorder, recurrent, unspecified: Secondary | ICD-10-CM | POA: Diagnosis not present

## 2015-02-01 DIAGNOSIS — E669 Obesity, unspecified: Secondary | ICD-10-CM | POA: Diagnosis not present

## 2015-02-01 DIAGNOSIS — E876 Hypokalemia: Secondary | ICD-10-CM | POA: Diagnosis not present

## 2015-02-01 DIAGNOSIS — K2971 Gastritis, unspecified, with bleeding: Secondary | ICD-10-CM | POA: Diagnosis not present

## 2015-02-01 DIAGNOSIS — D649 Anemia, unspecified: Secondary | ICD-10-CM | POA: Diagnosis not present

## 2015-02-05 DIAGNOSIS — F0391 Unspecified dementia with behavioral disturbance: Secondary | ICD-10-CM | POA: Diagnosis not present

## 2015-02-05 DIAGNOSIS — F339 Major depressive disorder, recurrent, unspecified: Secondary | ICD-10-CM | POA: Diagnosis not present

## 2015-02-05 DIAGNOSIS — I10 Essential (primary) hypertension: Secondary | ICD-10-CM | POA: Diagnosis not present

## 2015-02-05 DIAGNOSIS — E669 Obesity, unspecified: Secondary | ICD-10-CM | POA: Diagnosis not present

## 2015-02-05 DIAGNOSIS — F419 Anxiety disorder, unspecified: Secondary | ICD-10-CM | POA: Diagnosis not present

## 2015-02-05 DIAGNOSIS — K2971 Gastritis, unspecified, with bleeding: Secondary | ICD-10-CM | POA: Diagnosis not present

## 2015-02-05 DIAGNOSIS — M6281 Muscle weakness (generalized): Secondary | ICD-10-CM | POA: Diagnosis not present

## 2015-02-05 DIAGNOSIS — D649 Anemia, unspecified: Secondary | ICD-10-CM | POA: Diagnosis not present

## 2015-02-05 DIAGNOSIS — E876 Hypokalemia: Secondary | ICD-10-CM | POA: Diagnosis not present

## 2015-02-06 DIAGNOSIS — F0391 Unspecified dementia with behavioral disturbance: Secondary | ICD-10-CM | POA: Diagnosis not present

## 2015-02-06 DIAGNOSIS — F419 Anxiety disorder, unspecified: Secondary | ICD-10-CM | POA: Diagnosis not present

## 2015-02-06 DIAGNOSIS — F339 Major depressive disorder, recurrent, unspecified: Secondary | ICD-10-CM | POA: Diagnosis not present

## 2015-02-06 DIAGNOSIS — M6281 Muscle weakness (generalized): Secondary | ICD-10-CM | POA: Diagnosis not present

## 2015-02-06 DIAGNOSIS — E876 Hypokalemia: Secondary | ICD-10-CM | POA: Diagnosis not present

## 2015-02-06 DIAGNOSIS — I10 Essential (primary) hypertension: Secondary | ICD-10-CM | POA: Diagnosis not present

## 2015-02-06 DIAGNOSIS — D649 Anemia, unspecified: Secondary | ICD-10-CM | POA: Diagnosis not present

## 2015-02-06 DIAGNOSIS — E669 Obesity, unspecified: Secondary | ICD-10-CM | POA: Diagnosis not present

## 2015-02-06 DIAGNOSIS — K2971 Gastritis, unspecified, with bleeding: Secondary | ICD-10-CM | POA: Diagnosis not present

## 2015-02-12 DIAGNOSIS — M6281 Muscle weakness (generalized): Secondary | ICD-10-CM | POA: Diagnosis not present

## 2015-02-12 DIAGNOSIS — F0391 Unspecified dementia with behavioral disturbance: Secondary | ICD-10-CM | POA: Diagnosis not present

## 2015-02-12 DIAGNOSIS — E876 Hypokalemia: Secondary | ICD-10-CM | POA: Diagnosis not present

## 2015-02-12 DIAGNOSIS — F419 Anxiety disorder, unspecified: Secondary | ICD-10-CM | POA: Diagnosis not present

## 2015-02-12 DIAGNOSIS — E669 Obesity, unspecified: Secondary | ICD-10-CM | POA: Diagnosis not present

## 2015-02-12 DIAGNOSIS — D649 Anemia, unspecified: Secondary | ICD-10-CM | POA: Diagnosis not present

## 2015-02-12 DIAGNOSIS — I10 Essential (primary) hypertension: Secondary | ICD-10-CM | POA: Diagnosis not present

## 2015-02-12 DIAGNOSIS — K2971 Gastritis, unspecified, with bleeding: Secondary | ICD-10-CM | POA: Diagnosis not present

## 2015-02-12 DIAGNOSIS — F339 Major depressive disorder, recurrent, unspecified: Secondary | ICD-10-CM | POA: Diagnosis not present

## 2015-02-15 DIAGNOSIS — K2971 Gastritis, unspecified, with bleeding: Secondary | ICD-10-CM | POA: Diagnosis not present

## 2015-02-15 DIAGNOSIS — F0391 Unspecified dementia with behavioral disturbance: Secondary | ICD-10-CM | POA: Diagnosis not present

## 2015-02-15 DIAGNOSIS — D649 Anemia, unspecified: Secondary | ICD-10-CM | POA: Diagnosis not present

## 2015-02-15 DIAGNOSIS — F419 Anxiety disorder, unspecified: Secondary | ICD-10-CM | POA: Diagnosis not present

## 2015-02-15 DIAGNOSIS — F339 Major depressive disorder, recurrent, unspecified: Secondary | ICD-10-CM | POA: Diagnosis not present

## 2015-02-15 DIAGNOSIS — E669 Obesity, unspecified: Secondary | ICD-10-CM | POA: Diagnosis not present

## 2015-02-15 DIAGNOSIS — M6281 Muscle weakness (generalized): Secondary | ICD-10-CM | POA: Diagnosis not present

## 2015-02-15 DIAGNOSIS — E876 Hypokalemia: Secondary | ICD-10-CM | POA: Diagnosis not present

## 2015-02-15 DIAGNOSIS — I10 Essential (primary) hypertension: Secondary | ICD-10-CM | POA: Diagnosis not present

## 2015-02-27 DIAGNOSIS — F0391 Unspecified dementia with behavioral disturbance: Secondary | ICD-10-CM | POA: Diagnosis not present

## 2015-02-27 DIAGNOSIS — I10 Essential (primary) hypertension: Secondary | ICD-10-CM | POA: Diagnosis not present

## 2015-02-27 DIAGNOSIS — E876 Hypokalemia: Secondary | ICD-10-CM | POA: Diagnosis not present

## 2015-02-27 DIAGNOSIS — E669 Obesity, unspecified: Secondary | ICD-10-CM | POA: Diagnosis not present

## 2015-02-27 DIAGNOSIS — F339 Major depressive disorder, recurrent, unspecified: Secondary | ICD-10-CM | POA: Diagnosis not present

## 2015-02-27 DIAGNOSIS — M6281 Muscle weakness (generalized): Secondary | ICD-10-CM | POA: Diagnosis not present

## 2015-02-27 DIAGNOSIS — F419 Anxiety disorder, unspecified: Secondary | ICD-10-CM | POA: Diagnosis not present

## 2015-02-27 DIAGNOSIS — D649 Anemia, unspecified: Secondary | ICD-10-CM | POA: Diagnosis not present

## 2015-02-27 DIAGNOSIS — K2971 Gastritis, unspecified, with bleeding: Secondary | ICD-10-CM | POA: Diagnosis not present

## 2015-02-28 DIAGNOSIS — K2971 Gastritis, unspecified, with bleeding: Secondary | ICD-10-CM | POA: Diagnosis not present

## 2015-02-28 DIAGNOSIS — F0391 Unspecified dementia with behavioral disturbance: Secondary | ICD-10-CM | POA: Diagnosis not present

## 2015-02-28 DIAGNOSIS — I10 Essential (primary) hypertension: Secondary | ICD-10-CM | POA: Diagnosis not present

## 2015-02-28 DIAGNOSIS — D649 Anemia, unspecified: Secondary | ICD-10-CM | POA: Diagnosis not present

## 2015-02-28 DIAGNOSIS — E669 Obesity, unspecified: Secondary | ICD-10-CM | POA: Diagnosis not present

## 2015-02-28 DIAGNOSIS — M6281 Muscle weakness (generalized): Secondary | ICD-10-CM | POA: Diagnosis not present

## 2015-02-28 DIAGNOSIS — E876 Hypokalemia: Secondary | ICD-10-CM | POA: Diagnosis not present

## 2015-02-28 DIAGNOSIS — F419 Anxiety disorder, unspecified: Secondary | ICD-10-CM | POA: Diagnosis not present

## 2015-02-28 DIAGNOSIS — F339 Major depressive disorder, recurrent, unspecified: Secondary | ICD-10-CM | POA: Diagnosis not present

## 2015-03-26 DIAGNOSIS — D519 Vitamin B12 deficiency anemia, unspecified: Secondary | ICD-10-CM | POA: Diagnosis not present

## 2015-03-26 DIAGNOSIS — H6123 Impacted cerumen, bilateral: Secondary | ICD-10-CM | POA: Diagnosis not present

## 2015-03-26 DIAGNOSIS — R634 Abnormal weight loss: Secondary | ICD-10-CM | POA: Diagnosis not present

## 2015-03-26 DIAGNOSIS — I10 Essential (primary) hypertension: Secondary | ICD-10-CM | POA: Diagnosis not present

## 2015-03-26 DIAGNOSIS — H1045 Other chronic allergic conjunctivitis: Secondary | ICD-10-CM | POA: Diagnosis not present

## 2015-04-04 DIAGNOSIS — E785 Hyperlipidemia, unspecified: Secondary | ICD-10-CM | POA: Diagnosis not present

## 2015-04-04 DIAGNOSIS — Z79899 Other long term (current) drug therapy: Secondary | ICD-10-CM | POA: Diagnosis not present

## 2015-04-10 DIAGNOSIS — K922 Gastrointestinal hemorrhage, unspecified: Secondary | ICD-10-CM | POA: Diagnosis not present

## 2015-04-10 DIAGNOSIS — H6990 Unspecified Eustachian tube disorder, unspecified ear: Secondary | ICD-10-CM | POA: Diagnosis not present

## 2015-04-10 DIAGNOSIS — Z79899 Other long term (current) drug therapy: Secondary | ICD-10-CM | POA: Diagnosis not present

## 2015-04-10 DIAGNOSIS — F251 Schizoaffective disorder, depressive type: Secondary | ICD-10-CM | POA: Diagnosis not present

## 2015-04-10 DIAGNOSIS — D509 Iron deficiency anemia, unspecified: Secondary | ICD-10-CM | POA: Diagnosis not present

## 2015-04-17 ENCOUNTER — Emergency Department (HOSPITAL_COMMUNITY): Payer: Medicare Other

## 2015-04-17 ENCOUNTER — Encounter (HOSPITAL_COMMUNITY): Payer: Self-pay | Admitting: Emergency Medicine

## 2015-04-17 ENCOUNTER — Inpatient Hospital Stay (HOSPITAL_COMMUNITY)
Admission: EM | Admit: 2015-04-17 | Discharge: 2015-04-24 | DRG: 871 | Disposition: A | Discharge status: Skilled Nursing Facility | Payer: Medicare Other | Attending: Internal Medicine | Admitting: Internal Medicine

## 2015-04-17 DIAGNOSIS — Z23 Encounter for immunization: Secondary | ICD-10-CM | POA: Diagnosis not present

## 2015-04-17 DIAGNOSIS — Z809 Family history of malignant neoplasm, unspecified: Secondary | ICD-10-CM

## 2015-04-17 DIAGNOSIS — F0390 Unspecified dementia without behavioral disturbance: Secondary | ICD-10-CM | POA: Diagnosis present

## 2015-04-17 DIAGNOSIS — J9602 Acute respiratory failure with hypercapnia: Secondary | ICD-10-CM | POA: Diagnosis not present

## 2015-04-17 DIAGNOSIS — R918 Other nonspecific abnormal finding of lung field: Secondary | ICD-10-CM | POA: Diagnosis not present

## 2015-04-17 DIAGNOSIS — I5023 Acute on chronic systolic (congestive) heart failure: Secondary | ICD-10-CM | POA: Diagnosis present

## 2015-04-17 DIAGNOSIS — R748 Abnormal levels of other serum enzymes: Secondary | ICD-10-CM | POA: Diagnosis not present

## 2015-04-17 DIAGNOSIS — R069 Unspecified abnormalities of breathing: Secondary | ICD-10-CM | POA: Diagnosis not present

## 2015-04-17 DIAGNOSIS — Z7982 Long term (current) use of aspirin: Secondary | ICD-10-CM

## 2015-04-17 DIAGNOSIS — N189 Chronic kidney disease, unspecified: Secondary | ICD-10-CM

## 2015-04-17 DIAGNOSIS — R489 Unspecified symbolic dysfunctions: Secondary | ICD-10-CM | POA: Diagnosis not present

## 2015-04-17 DIAGNOSIS — F39 Unspecified mood [affective] disorder: Secondary | ICD-10-CM | POA: Diagnosis not present

## 2015-04-17 DIAGNOSIS — Z6832 Body mass index (BMI) 32.0-32.9, adult: Secondary | ICD-10-CM

## 2015-04-17 DIAGNOSIS — I10 Essential (primary) hypertension: Secondary | ICD-10-CM | POA: Diagnosis present

## 2015-04-17 DIAGNOSIS — J181 Lobar pneumonia, unspecified organism: Secondary | ICD-10-CM

## 2015-04-17 DIAGNOSIS — J984 Other disorders of lung: Secondary | ICD-10-CM | POA: Diagnosis not present

## 2015-04-17 DIAGNOSIS — Z87891 Personal history of nicotine dependence: Secondary | ICD-10-CM

## 2015-04-17 DIAGNOSIS — N181 Chronic kidney disease, stage 1: Secondary | ICD-10-CM | POA: Diagnosis not present

## 2015-04-17 DIAGNOSIS — E876 Hypokalemia: Secondary | ICD-10-CM | POA: Diagnosis not present

## 2015-04-17 DIAGNOSIS — I509 Heart failure, unspecified: Secondary | ICD-10-CM | POA: Diagnosis not present

## 2015-04-17 DIAGNOSIS — R0902 Hypoxemia: Secondary | ICD-10-CM | POA: Diagnosis present

## 2015-04-17 DIAGNOSIS — Y95 Nosocomial condition: Secondary | ICD-10-CM | POA: Diagnosis present

## 2015-04-17 DIAGNOSIS — E785 Hyperlipidemia, unspecified: Secondary | ICD-10-CM | POA: Diagnosis not present

## 2015-04-17 DIAGNOSIS — E669 Obesity, unspecified: Secondary | ICD-10-CM | POA: Diagnosis not present

## 2015-04-17 DIAGNOSIS — Z8249 Family history of ischemic heart disease and other diseases of the circulatory system: Secondary | ICD-10-CM | POA: Diagnosis not present

## 2015-04-17 DIAGNOSIS — J189 Pneumonia, unspecified organism: Secondary | ICD-10-CM | POA: Diagnosis not present

## 2015-04-17 DIAGNOSIS — Z8711 Personal history of peptic ulcer disease: Secondary | ICD-10-CM | POA: Diagnosis not present

## 2015-04-17 DIAGNOSIS — R0682 Tachypnea, not elsewhere classified: Secondary | ICD-10-CM

## 2015-04-17 DIAGNOSIS — D649 Anemia, unspecified: Secondary | ICD-10-CM | POA: Diagnosis not present

## 2015-04-17 DIAGNOSIS — R6521 Severe sepsis with septic shock: Secondary | ICD-10-CM | POA: Diagnosis not present

## 2015-04-17 DIAGNOSIS — F419 Anxiety disorder, unspecified: Secondary | ICD-10-CM | POA: Diagnosis not present

## 2015-04-17 DIAGNOSIS — J80 Acute respiratory distress syndrome: Secondary | ICD-10-CM | POA: Diagnosis not present

## 2015-04-17 DIAGNOSIS — J9601 Acute respiratory failure with hypoxia: Secondary | ICD-10-CM | POA: Diagnosis present

## 2015-04-17 DIAGNOSIS — Z79891 Long term (current) use of opiate analgesic: Secondary | ICD-10-CM | POA: Diagnosis not present

## 2015-04-17 DIAGNOSIS — F329 Major depressive disorder, single episode, unspecified: Secondary | ICD-10-CM | POA: Diagnosis present

## 2015-04-17 DIAGNOSIS — K5900 Constipation, unspecified: Secondary | ICD-10-CM | POA: Diagnosis not present

## 2015-04-17 DIAGNOSIS — M6281 Muscle weakness (generalized): Secondary | ICD-10-CM | POA: Diagnosis not present

## 2015-04-17 DIAGNOSIS — R6520 Severe sepsis without septic shock: Secondary | ICD-10-CM | POA: Diagnosis not present

## 2015-04-17 DIAGNOSIS — R1312 Dysphagia, oropharyngeal phase: Secondary | ICD-10-CM | POA: Diagnosis not present

## 2015-04-17 DIAGNOSIS — N179 Acute kidney failure, unspecified: Secondary | ICD-10-CM | POA: Diagnosis not present

## 2015-04-17 DIAGNOSIS — I501 Left ventricular failure: Secondary | ICD-10-CM | POA: Diagnosis not present

## 2015-04-17 DIAGNOSIS — Z79899 Other long term (current) drug therapy: Secondary | ICD-10-CM | POA: Diagnosis not present

## 2015-04-17 DIAGNOSIS — H6990 Unspecified Eustachian tube disorder, unspecified ear: Secondary | ICD-10-CM | POA: Diagnosis not present

## 2015-04-17 DIAGNOSIS — G8929 Other chronic pain: Secondary | ICD-10-CM | POA: Diagnosis present

## 2015-04-17 DIAGNOSIS — A419 Sepsis, unspecified organism: Principal | ICD-10-CM | POA: Diagnosis present

## 2015-04-17 DIAGNOSIS — T502X5A Adverse effect of carbonic-anhydrase inhibitors, benzothiadiazides and other diuretics, initial encounter: Secondary | ICD-10-CM | POA: Diagnosis not present

## 2015-04-17 DIAGNOSIS — I129 Hypertensive chronic kidney disease with stage 1 through stage 4 chronic kidney disease, or unspecified chronic kidney disease: Secondary | ICD-10-CM | POA: Diagnosis not present

## 2015-04-17 DIAGNOSIS — R0602 Shortness of breath: Secondary | ICD-10-CM | POA: Diagnosis not present

## 2015-04-17 DIAGNOSIS — I5022 Chronic systolic (congestive) heart failure: Secondary | ICD-10-CM | POA: Diagnosis not present

## 2015-04-17 DIAGNOSIS — K922 Gastrointestinal hemorrhage, unspecified: Secondary | ICD-10-CM | POA: Diagnosis not present

## 2015-04-17 LAB — BLOOD GAS, ARTERIAL
Acid-Base Excess: 0.2 mmol/L (ref 0.0–2.0)
BICARBONATE: 25.4 meq/L — AB (ref 20.0–24.0)
DRAWN BY: 103701
O2 Content: 5 L/min
O2 Saturation: 90.5 %
PCO2 ART: 48.9 mmHg — AB (ref 35.0–45.0)
PH ART: 7.34 — AB (ref 7.350–7.450)
Patient temperature: 100.3
TCO2: 24.3 mmol/L (ref 0–100)
pO2, Arterial: 71.2 mmHg — ABNORMAL LOW (ref 80.0–100.0)

## 2015-04-17 LAB — I-STAT TROPONIN, ED: Troponin i, poc: 0.01 ng/mL (ref 0.00–0.08)

## 2015-04-17 LAB — CBC WITH DIFFERENTIAL/PLATELET
BASOS PCT: 0 % (ref 0–1)
Basophils Absolute: 0 10*3/uL (ref 0.0–0.1)
EOS ABS: 0.2 10*3/uL (ref 0.0–0.7)
Eosinophils Relative: 2 % (ref 0–5)
HCT: 30.2 % — ABNORMAL LOW (ref 36.0–46.0)
HEMOGLOBIN: 9.1 g/dL — AB (ref 12.0–15.0)
LYMPHS PCT: 20 % (ref 12–46)
Lymphs Abs: 1.6 10*3/uL (ref 0.7–4.0)
MCH: 21.3 pg — AB (ref 26.0–34.0)
MCHC: 30.1 g/dL (ref 30.0–36.0)
MCV: 70.7 fL — AB (ref 78.0–100.0)
Monocytes Absolute: 0.9 10*3/uL (ref 0.1–1.0)
Monocytes Relative: 11 % (ref 3–12)
NEUTROS ABS: 5.1 10*3/uL (ref 1.7–7.7)
Neutrophils Relative %: 67 % (ref 43–77)
Platelets: 250 10*3/uL (ref 150–400)
RBC: 4.27 MIL/uL (ref 3.87–5.11)
RDW: 19.8 % — ABNORMAL HIGH (ref 11.5–15.5)
WBC: 7.8 10*3/uL (ref 4.0–10.5)

## 2015-04-17 LAB — COMPREHENSIVE METABOLIC PANEL
ALK PHOS: 80 U/L (ref 38–126)
ALT: 23 U/L (ref 14–54)
AST: 43 U/L — ABNORMAL HIGH (ref 15–41)
Albumin: 3.4 g/dL — ABNORMAL LOW (ref 3.5–5.0)
Anion gap: 8 (ref 5–15)
BUN: 26 mg/dL — ABNORMAL HIGH (ref 6–20)
CALCIUM: 10.1 mg/dL (ref 8.9–10.3)
CO2: 30 mmol/L (ref 22–32)
Chloride: 101 mmol/L (ref 101–111)
Creatinine, Ser: 1.53 mg/dL — ABNORMAL HIGH (ref 0.44–1.00)
GFR calc Af Amer: 35 mL/min — ABNORMAL LOW (ref >60)
GFR calc non Af Amer: 30 mL/min — ABNORMAL LOW (ref >60)
GLUCOSE: 125 mg/dL — AB (ref 65–99)
Potassium: 3.4 mmol/L — ABNORMAL LOW (ref 3.5–5.1)
Sodium: 139 mmol/L (ref 135–145)
Total Bilirubin: 0.5 mg/dL (ref 0.3–1.2)
Total Protein: 6.9 g/dL (ref 6.5–8.1)

## 2015-04-17 LAB — URINE MICROSCOPIC-ADD ON

## 2015-04-17 LAB — URINALYSIS, ROUTINE W REFLEX MICROSCOPIC
BILIRUBIN URINE: NEGATIVE
GLUCOSE, UA: NEGATIVE mg/dL
KETONES UR: NEGATIVE mg/dL
LEUKOCYTES UA: NEGATIVE
Nitrite: NEGATIVE
PROTEIN: NEGATIVE mg/dL
Specific Gravity, Urine: 1.017 (ref 1.005–1.030)
Urobilinogen, UA: 0.2 mg/dL (ref 0.0–1.0)
pH: 5.5 (ref 5.0–8.0)

## 2015-04-17 LAB — BRAIN NATRIURETIC PEPTIDE: B NATRIURETIC PEPTIDE 5: 125.7 pg/mL — AB (ref 0.0–100.0)

## 2015-04-17 LAB — I-STAT CG4 LACTIC ACID, ED: Lactic Acid, Venous: 1.49 mmol/L (ref 0.5–2.0)

## 2015-04-17 MED ORDER — VANCOMYCIN HCL 10 G IV SOLR
1250.0000 mg | INTRAVENOUS | Status: AC
  Administered 2015-04-17: 1250 mg via INTRAVENOUS
  Filled 2015-04-17: qty 1250

## 2015-04-17 MED ORDER — HEPARIN SODIUM (PORCINE) 5000 UNIT/ML IJ SOLN
5000.0000 [IU] | Freq: Three times a day (TID) | INTRAMUSCULAR | Status: DC
  Administered 2015-04-17 – 2015-04-24 (×19): 5000 [IU] via SUBCUTANEOUS
  Filled 2015-04-17 (×23): qty 1

## 2015-04-17 MED ORDER — VANCOMYCIN HCL IN DEXTROSE 750-5 MG/150ML-% IV SOLN
750.0000 mg | INTRAVENOUS | Status: DC
  Filled 2015-04-17: qty 150

## 2015-04-17 MED ORDER — PIPERACILLIN-TAZOBACTAM 3.375 G IVPB 30 MIN
3.3750 g | Freq: Three times a day (TID) | INTRAVENOUS | Status: DC
  Administered 2015-04-17: 3.375 g via INTRAVENOUS
  Filled 2015-04-17: qty 50

## 2015-04-17 MED ORDER — SODIUM CHLORIDE 0.9 % IV SOLN
INTRAVENOUS | Status: DC
  Administered 2015-04-17 – 2015-04-23 (×3): via INTRAVENOUS

## 2015-04-17 MED ORDER — SODIUM CHLORIDE 0.9 % IV BOLUS (SEPSIS)
1000.0000 mL | Freq: Once | INTRAVENOUS | Status: AC
Start: 2015-04-17 — End: 2015-04-17
  Administered 2015-04-17: 1000 mL via INTRAVENOUS

## 2015-04-17 MED ORDER — IPRATROPIUM-ALBUTEROL 0.5-2.5 (3) MG/3ML IN SOLN
3.0000 mL | Freq: Four times a day (QID) | RESPIRATORY_TRACT | Status: DC
  Administered 2015-04-17 – 2015-04-21 (×16): 3 mL via RESPIRATORY_TRACT
  Filled 2015-04-17 (×16): qty 3

## 2015-04-17 MED ORDER — PIPERACILLIN-TAZOBACTAM 3.375 G IVPB
3.3750 g | Freq: Three times a day (TID) | INTRAVENOUS | Status: DC
  Administered 2015-04-18 – 2015-04-24 (×20): 3.375 g via INTRAVENOUS
  Filled 2015-04-17 (×22): qty 50

## 2015-04-17 MED ORDER — SODIUM CHLORIDE 0.9 % IV BOLUS (SEPSIS)
1000.0000 mL | Freq: Once | INTRAVENOUS | Status: AC
  Administered 2015-04-17: 1000 mL via INTRAVENOUS

## 2015-04-17 MED ORDER — GUAIFENESIN ER 600 MG PO TB12
600.0000 mg | ORAL_TABLET | Freq: Two times a day (BID) | ORAL | Status: DC
  Administered 2015-04-18 – 2015-04-24 (×10): 600 mg via ORAL
  Filled 2015-04-17 (×12): qty 1

## 2015-04-17 MED ORDER — METHYLPREDNISOLONE SODIUM SUCC 125 MG IJ SOLR
60.0000 mg | Freq: Four times a day (QID) | INTRAMUSCULAR | Status: DC
  Administered 2015-04-17 – 2015-04-22 (×19): 60 mg via INTRAVENOUS
  Filled 2015-04-17 (×19): qty 2

## 2015-04-17 NOTE — ED Notes (Signed)
Bed: XY80 Expected date:  Expected time:  Means of arrival:  Comments: EMS/86F/resp. Distress

## 2015-04-17 NOTE — ED Notes (Signed)
Respiratory called for bipap [phone]

## 2015-04-17 NOTE — Progress Notes (Signed)
ANTIBIOTIC CONSULT NOTE - INITIAL  Pharmacy Consult for Vancomycin, Zosyn Indication: pneumonia  Allergies  Allergen Reactions  . Pollen Extract Other (See Comments)    Swollen Eyes & Runny Nose.    Patient Measurements: Height: 5' (152.4 cm) Weight: 165 lb (74.844 kg) IBW/kg (Calculated) : 45.5  Vital Signs: Temp: 100.3 F (37.9 C) (08/16 1651) Temp Source: Rectal (08/16 1651) BP: 131/61 mmHg (08/16 1651) Pulse Rate: 121 (08/16 1651) Intake/Output from previous day:    Labs:  Recent Labs  04/17/15 1655  WBC 7.8  HGB 9.1*  PLT 250  CREATININE 1.53*   Estimated Creatinine Clearance: 25.2 mL/min (by C-G formula based on Cr of 1.53).  No results for input(s): VANCOTROUGH, VANCOPEAK, VANCORANDOM, GENTTROUGH, GENTPEAK, GENTRANDOM, TOBRATROUGH, TOBRAPEAK, TOBRARND, AMIKACINPEAK, AMIKACINTROU, AMIKACIN in the last 72 hours.   Microbiology: No results found for this or any previous visit (from the past 720 hour(s)).  Medical History: Past Medical History  Diagnosis Date  . Hyperlipidemia   . Hypertension   . Obesity     5'3"  . Gastric ulcer 05/2011  . Chronic pain   . Anxiety   . Depression     Medications:  Anti-infectives    Start     Dose/Rate Route Frequency Ordered Stop   04/17/15 1700  vancomycin (VANCOCIN) 1,250 mg in sodium chloride 0.9 % 250 mL IVPB     1,250 mg 166.7 mL/hr over 90 Minutes Intravenous STAT 04/17/15 1637 04/18/15 1700   04/17/15 1645  piperacillin-tazobactam (ZOSYN) IVPB 3.375 g     3.375 g 100 mL/hr over 30 Minutes Intravenous 3 times per day 04/17/15 1637       Assessment: 77 yoF [facility] ED [facility] from [facility] with fever, cough, and wheezing.  CXR shows potential aspiration pneumonitis, multilobar pneumonia, or acute pulmonary edema.  Pharmacy is consulted to dose vancomycin and Zosyn for HCAP.  8/16 >> Vanc >> 8/16 >> Zosyn >>    Today, 04/17/2015:  Tm 100.3  WBC 7.8  SCr 1.53 with CrCl ~ 25 ml/min  Blood and urine  cultures ordered  Lactic acid 1.49   Goal of Therapy:  Vancomycin trough level 15-20 mcg/ml Appropriate abx dosing, eradication of infection.   Plan:   Zosyn 3.375g IV Q8H infused over 4hrs.   Vancomycin 1250mg  IV once, then 750 mg IV q24h.  Measure Vanc trough at steady state.  Follow up renal fxn, culture results, and clinical course.   Gretta Arab PharmD, BCPS Pager [phone] 04/17/2015 5:12 PM

## 2015-04-17 NOTE — ED Notes (Signed)
Unable to collect labs at this time patient getting a xray

## 2015-04-17 NOTE — ED Notes (Signed)
Per EMS-Pt from [facility] at [facility] Staff reports decreased ambulation and increased coughing/wheezing starting today. Tmax at SNF was 100.1. Received 10 mg Albuterol and 0.5 Atrovent en route per EMS. Arrived with treatment finishing. ST at 104. No other c/c.

## 2015-04-17 NOTE — Progress Notes (Signed)
BiPAP not needed at this time.  Pt stable on 50% venturi mask with HR-104, RR-21, and SpO2-97%.  Discussed with MD and will use BiPAP as needed.  MD aware she is currently stable on 50% VM.

## 2015-04-17 NOTE — Clinical Social Work Note (Signed)
Clinical Social Work Assessment  Patient Details  Name: Jamie [name] MRN: [id number] Date of Birth: 07-22-1930  Date of referral:  04/17/15               Reason for consult:   (Patient is from [facility] facility.)                Permission sought to share information with:   (None.) Permission granted to share information::  No  Name::        Agency::     Relationship::     Contact Information:     Housing/Transportation Living arrangements for the past 2 months:  [place] of Information:  Patient Patient Interpreter Needed:  None Criminal Activity/Legal Involvement Pertinent to Current Situation/Hospitalization:  No - Comment as needed Significant Relationships:   (Not able to assess.) Lives with:  Facility Resident Do you feel safe going back to the place where you live?   (Patient was not able to effectilvely answer questions.) Need for family participation in patient care:  Yes (Comment) (CSW believes it would be helpful if patient has support from family.)  Care giving concerns:  There are no care giving concerns at this time. The patient is from [facility].    Social Worker assessment / plan:  CSW met with patient at bedside. However, she is not effectively communicative. Patient is hard of hearing. There was no family present. Per note, the patient presents to [facility] with pneumonia, wheezing, and respiratory distress. Also, the patient has a hx of anxiety and depression.  Patient was able to confirm that she comes from [facility]. However, patient was not able to answer any other questions effectively. Per note, Staff reports decreased ambulation and increased coughing/wheezing starting today.   Employment status:  Retired Forensic scientist:   Education officer, environmental.) PT Recommendations:  Not assessed at this time Information / Referral to community resources:   (Patient lives at an ALF.)  Patient/Family's Response to care:  There was no  family present. Patient is aware that she will be admitted. Patient is accepting.   Patient/Family's Understanding of and Emotional Response to Diagnosis, Current Treatment, and Prognosis:  Patient is aware that she will be admitted.   Emotional Assessment Appearance:  Appears stated age Attitude/Demeanor/Rapport:  Unable to Assess Affect (typically observed):   (Sleepy.) Orientation:  Fluctuating Orientation (Suspected and/or reported Sundowners) Alcohol / Substance use:  Not Applicable Psych involvement (Current and /or in the community):  No (Comment)  Discharge Needs  Concerns to be addressed:  No discharge needs identified Readmission within the last 30 days:  No Current discharge risk:  None Barriers to Discharge:  No Barriers Identified   Bernita Buffy, LCSW 04/17/2015, 6:13 PM

## 2015-04-17 NOTE — ED Provider Notes (Signed)
CSN: [id number]     Arrival date & time 04/17/15  1627 History   First MD Initiated Contact with Patient 04/17/15 1627     Chief Complaint  Patient presents with  . Pneumonia  . Wheezing  . Respiratory Distress     (Consider location/radiation/quality/duration/timing/severity/associated sxs/prior Treatment) Patient is a 81 y.o. female presenting with pneumonia and wheezing. The history is provided by the EMS personnel and the nursing home.  Pneumonia This is a new problem. The current episode started yesterday. The problem occurs constantly. The problem has been gradually worsening. Associated symptoms include shortness of breath. Nothing aggravates the symptoms. Nothing relieves the symptoms. She has tried nothing for the symptoms. The treatment provided no relief.  Wheezing Severity:  Moderate Severity compared to prior episodes:  More severe Onset quality:  Gradual Timing:  Constant Chronicity:  New Relieved by:  Nothing Worsened by:  Nothing tried Ineffective treatments:  None tried Associated symptoms: cough, fever and shortness of breath     Past Medical History  Diagnosis Date  . Hyperlipidemia   . Hypertension   . Obesity     5'3"  . Gastric ulcer 05/2011  . Chronic pain   . Anxiety   . Depression    Past Surgical History  Procedure Laterality Date  . Bilateral oophorectomy    . Skin biopsy      nose lesion, noncancerous  . Esophagogastroduodenoscopy  08/12/2012    Procedure: ESOPHAGOGASTRODUODENOSCOPY (EGD);  Surgeon: Arta Silence, MD;  Location: [facility];  Service: Endoscopy;  Laterality: Left;  . Abdominal hysterectomy      PARTIAL  . Esophagogastroduodenoscopy (egd) with propofol N/A 12/28/2014    Procedure: ESOPHAGOGASTRODUODENOSCOPY (EGD) WITH PROPOFOL;  Surgeon: Wilford Corner, MD;  Location: [facility];  Service: Endoscopy;  Laterality: N/A;   Family History  Problem Relation Age of Onset  . Hypertension Mother   . Hyperlipidemia Mother    . Heart disease Father     64s  . Cancer Brother    Social History  Substance Use Topics  . Smoking status: Former Smoker -- 1.00 packs/day    Types: Cigarettes    Quit date: 09/02/1971  . Smokeless tobacco: Never Used  . Alcohol Use: Yes     Comment: occasionally   OB History    No data available     Review of Systems  Unable to perform ROS: Dementia  Constitutional: Positive for fever.  Respiratory: Positive for cough, shortness of breath and wheezing.       Allergies  Pollen extract  Home Medications   Prior to Admission medications   Medication Sig Start Date End Date Taking? Authorizing Provider  ARIPiprazole (ABILIFY) 5 MG tablet Take 5 mg by mouth at bedtime.   Yes Historical Provider, MD  aspirin EC 81 MG tablet Take 1 tablet (81 mg total) by mouth daily with breakfast. Resume in 2 weeks. 01/15/15  Yes Eugenie Filler, MD  atorvastatin (LIPITOR) 20 MG tablet Take 20 mg by mouth at bedtime.   Yes Historical Provider, MD  Calcium Carbonate-Vitamin D 600-400 MG-UNIT per tablet Take 1 tablet by mouth daily with breakfast. 10/26/14 10/26/15 Yes Historical Provider, MD  Cholecalciferol (VITAMIN D) 2000 UNITS tablet Take 2,000 Units by mouth daily.   Yes Historical Provider, MD  fluticasone (FLONASE) 50 MCG/ACT nasal spray Place 2 sprays into both nostrils daily.   Yes Historical Provider, MD  gabapentin (NEURONTIN) 100 MG capsule Take 100 mg by mouth at bedtime.   Yes Historical Provider,  MD  HYDROcodone-acetaminophen (NORCO/VICODIN) 5-325 MG per tablet Take 1 tablet by mouth every 4 (four) hours as needed for moderate pain. 01/01/15  Yes Eugenie Filler, MD  hydroxypropyl methylcellulose / hypromellose (ISOPTO TEARS / GONIOVISC) 2.5 % ophthalmic solution Place 1 drop into both eyes every 2 (two) hours as needed for dry eyes.   Yes Historical Provider, MD  lisinopril (PRINIVIL,ZESTRIL) 20 MG tablet Take 20 mg by mouth daily.   Yes Historical Provider, MD  loratadine  (CLARITIN) 10 MG tablet Take 10 mg by mouth daily.   Yes Historical Provider, MD  Melatonin 1 MG TABS Take 1 tablet by mouth at bedtime.   Yes Historical Provider, MD  Multiple Vitamins-Minerals (THERA-TABS M) TABS Take 1 tablet by mouth daily with breakfast.   Yes Historical Provider, MD  olopatadine (PATANOL) 0.1 % ophthalmic solution Place 1 drop into both eyes 2 (two) times daily.   Yes Historical Provider, MD  pantoprazole (PROTONIX) 40 MG tablet Take 1 tablet (40 mg total) by mouth 2 (two) times daily before a meal. Take 1 tablet 2 times daily x 4 weeks, then 1 tablet daily. Patient taking differently: Take 40 mg by mouth 2 (two) times daily before a meal.  01/01/15  Yes Eugenie Filler, MD  polyethylene glycol (MIRALAX / GLYCOLAX) packet Take 17 g by mouth daily.   Yes Historical Provider, MD  risperiDONE (RISPERDAL) 0.5 MG tablet Take 0.5-1 mg by mouth 2 (two) times daily. 1 tab in the morning and 2 tabs at bedtime   Yes Historical Provider, MD  senna (SENOKOT) 8.6 MG TABS tablet Take 1 tablet (8.6 mg total) by mouth at bedtime. 01/01/15  Yes Eugenie Filler, MD  sertraline (ZOLOFT) 50 MG tablet Take 50 mg by mouth daily.   Yes Historical Provider, MD  vitamin B-12 (CYANOCOBALAMIN) 500 MCG tablet Take 1,000 mcg by mouth daily.    Yes Historical Provider, MD  donepezil (ARICEPT) 5 MG tablet Take 1 tablet (5 mg total) by mouth at bedtime. Patient not taking: Reported on 04/17/2015 08/14/14   Kennyth Arnold, FNP  risperiDONE (RISPERDAL) 1 MG tablet Take 1 tablet (1 mg total) by mouth at bedtime. Patient not taking: Reported on 04/17/2015 08/14/14   Kennyth Arnold, FNP  sulfamethoxazole-trimethoprim (BACTRIM DS,SEPTRA DS) 800-160 MG per tablet Take 1 tablet by mouth every 12 (twelve) hours. Take for 4 days then stop. Patient not taking: Reported on 04/17/2015 01/01/15   Eugenie Filler, MD   BP 131/61 mmHg  Pulse 121  Temp(Src) 100.3 F (37.9 C) (Rectal)  Resp 16  Ht 5' (1.524 m)  Wt 165 lb  (74.844 kg)  BMI 32.22 kg/m2  SpO2 97% Physical Exam  Constitutional: She appears well-developed. She appears listless. She appears ill. Face mask in place.  HENT:  Head: Normocephalic and atraumatic.  Eyes: Conjunctivae are normal.  Neck: Neck supple. No tracheal deviation present.  Cardiovascular: Normal rate and regular rhythm.   Pulmonary/Chest: No accessory muscle usage. No respiratory distress. She has no decreased breath sounds. She has wheezes (coarse diffuse bilateral).  Abdominal: Soft. She exhibits no distension. There is no tenderness.  Neurological: She appears listless. GCS eye subscore is 4. GCS verbal subscore is 4. GCS motor subscore is 6.  Skin: Skin is warm and dry. There is pallor.  Psychiatric: She has a normal mood and affect.    ED Course  Procedures (including critical care time) CRITICAL CARE Performed by: Leo Grosser Total critical care time: 74  min Critical care time was exclusive of separately billable procedures and treating other patients. Critical care was necessary to treat or prevent imminent or life-threatening deterioration. Critical care was time spent personally by me on the following activities: development of treatment plan with patient and/or surrogate as well as nursing, discussions with consultants, evaluation of patient's response to treatment, examination of patient, obtaining history from patient or surrogate, ordering and performing treatments and interventions, ordering and review of laboratory studies, ordering and review of radiographic studies, pulse oximetry and re-evaluation of patient's condition.  Labs Review Labs Reviewed  COMPREHENSIVE METABOLIC PANEL - Abnormal; Notable for the following:    Potassium 3.4 (*)    Glucose, Bld 125 (*)    BUN 26 (*)    Creatinine, Ser 1.53 (*)    Albumin 3.4 (*)    AST 43 (*)    GFR calc non Af Amer 30 (*)    GFR calc Af Amer 35 (*)    All other components within normal limits  CBC WITH  DIFFERENTIAL/PLATELET - Abnormal; Notable for the following:    Hemoglobin 9.1 (*)    HCT 30.2 (*)    MCV 70.7 (*)    MCH 21.3 (*)    RDW 19.8 (*)    All other components within normal limits  URINALYSIS, ROUTINE W REFLEX MICROSCOPIC (NOT AT [facility]) - Abnormal; Notable for the following:    APPearance CLOUDY (*)    Hgb urine dipstick LARGE (*)    All other components within normal limits  BRAIN NATRIURETIC PEPTIDE - Abnormal; Notable for the following:    B Natriuretic Peptide 125.7 (*)    All other components within normal limits  BLOOD GAS, ARTERIAL - Abnormal; Notable for the following:    pH, Arterial 7.340 (*)    pCO2 arterial 48.9 (*)    pO2, Arterial 71.2 (*)    Bicarbonate 25.4 (*)    All other components within normal limits  URINE MICROSCOPIC-ADD ON - Abnormal; Notable for the following:    Bacteria, UA FEW (*)    All other components within normal limits  CULTURE, BLOOD (ROUTINE X 2)  CULTURE, BLOOD (ROUTINE X 2)  URINE CULTURE  I-STAT CG4 LACTIC ACID, ED  I-STAT TROPOININ, ED  I-STAT CG4 LACTIC ACID, ED    Imaging Review Dg Chest Port 1 View  04/17/2015   CLINICAL DATA:  Pneumonia. Shortness of breath. Symptoms started today.  EXAM: PORTABLE CHEST - 1 VIEW  COMPARISON:  12/27/2014  FINDINGS: Bilateral indistinct pulmonary vasculature observed with hazy opacities in the right upper lobe and potentially in the left upper lobe.  The patient is rotated to the right on today's radiograph, reducing diagnostic sensitivity and specificity. Atherosclerotic aortic arch noted. Possible underlying hiatal hernia.  IMPRESSION: 1. Indistinct densities in the upper lobes, right greater than left, potentially from aspiration pneumonitis, multilobar pneumonia, or acute pulmonary edema. Upper normal heart size. 2. Retrocardiac density, possibly from a chronic hiatal hernia. 3. Rightward mediastinal prominence is primarily due to rightward rotation. 4. Followup radiography to ensure clearance  is recommended.   Electronically Signed   By: Van Clines M.D.   On: 04/17/2015 17:04   I have personally reviewed and evaluated these images and lab results as part of my medical decision-making.   EKG Interpretation   Date/Time:  Tuesday April 17 2015 16:54:43 EDT Ventricular Rate:  132 PR Interval:    QRS Duration: 74 QT Interval:  287 QTC Calculation: 425 R Axis:  15 Text Interpretation:  Sinus tachycardia Repolarization abnormality, prob  rate related Artifact in lead(s) I II aVR Confirmed by Larri Brewton MD, Quillian Quince  ([id number]) on 04/17/2015 5:35:32 PM      MDM   Final diagnoses:  HCAP (healthcare-associated pneumonia)  Multifocal lung consolidation  AKI (acute kidney injury)  Severe sepsis with acute organ dysfunction  Acute respiratory failure with hypoxia and hypercapnia    81 year old female presents with productive cough, wheezing, fever, difficulty breathing over the last day from her nursing facility. Clinical appearance is consistent with multifocal pneumonia and this is demonstrated on x-ray. Patient is from health care facility, was covered for HCAP with vancomycin and Zosyn, lab work and further workup pending, no evidence of impending airway failure, continue supportive care with nebulization.  Patient had increasing oxygen requirement after stopping nebulization. She had no increased work of breathing but it appears her lung parenchyma is damaged in the setting of acute infection. Multifocal opacities are consistent clinically with healthcare associated pneumonia. Her tachycardia improved following fluid boluses, she will require close management for acute hypoxemic and hypercapnic respiratory failure. Blood gas is concerning for ongoing hypoxemia and mild hypercapnia so BiPAP was initiated to try to optimize her oxygenation and ventilation with hopes to avoid intubation in this frail elderly person.  I contacted her representative at the department of social services  who is on-call and they agreed to contact her main caseworker to go over [place] in the morning but for the time being she is full code. Hospitalist was consulted for admission and will see the patient in the emergency department for stepdown admission.   Leo Grosser, MD 04/18/15 [phone]

## 2015-04-17 NOTE — ED Notes (Signed)
Pt has continued to take venti mask off and pulse oximeter,  Pt redirected to leave on to help with breathing

## 2015-04-17 NOTE — H&P (Signed)
[facility] History and Physical  Patient: Jamie [name]  MRN: [id number]  DOB: December 25, 1929  DOS: the patient was seen and examined on 04/17/2015 PCP: Kennyth Arnold, FNP  Referring physician: Dr. Laneta Simmers Chief Complaint: Shortness of breath  HPI: Jamie [name] is a 81 y.o. female with Past medical history of dementia, hypertension, mood disorder. The patient is presenting from [facility]. The history is limited due to patient's dementia, although the time of my evaluation the patient denied any complaints of shortness of breath chest pain abdominal pain headache. As per my discussion with the med tech at the [facility], the patient at her baseline ambulates with a walker, does not have any dysphagia, and takes part in daily activity. There was no fall no nausea no vomiting no diarrhea reported. She saw her last on Sunday at which time she felt that she had some facial swelling which improved in the morning on Monday. Reportedly on Saturday the patient was feeling sick and tired and was laying in the bed for the whole day. Per EMS the patient started having shortness of breath on 04/16/2015 which progressively worsened and was associated with cough as well as fever. Says they noted also some changes in her mental status and brought her here for further workup.  The patient is coming from ALF.  At her baseline ambulates with walker And is dependent for most of her ADL does not manages her medication on her own.  Review of Systems: as mentioned in the history of present illness.  A comprehensive review of the other systems is negative.  Past Medical History  Diagnosis Date  . Hyperlipidemia   . Hypertension   . Obesity     5\'3"  . Gastric ulcer 05/2011  . Chronic pain   . Anxiety   . Depression    Past Surgical History  Procedure Laterality Date  . Bilateral oophorectomy    . Skin biopsy      nose lesion, noncancerous  . Esophagogastroduodenoscopy   08/12/2012    Procedure: ESOPHAGOGASTRODUODENOSCOPY (EGD);  Surgeon: William Outlaw, MD;  Location: [facility];  Service: Endoscopy;  Laterality: Left;  . Abdominal hysterectomy      PARTIAL  . Esophagogastroduodenoscopy (egd) with propofol N/A 12/28/2014    Procedure: ESOPHAGOGASTRODUODENOSCOPY (EGD) WITH PROPOFOL;  Surgeon: Vincent Schooler, MD;  Location: [facility];  Service: Endoscopy;  Laterality: N/A;   Social History:  reports that she quit smoking about 43 years ago. Her smoking use included Cigarettes. She smoked 1.00 pack per day. She has never used smokeless tobacco. She reports that she drinks alcohol. She reports that she does not use illicit drugs.  Allergies  Allergen Reactions  . Pollen Extract Other (See Comments)    Swollen Eyes & Runny Nose.    Family History  Problem Relation Age of Onset  . Hypertension Mother   . Hyperlipidemia Mother   . Heart disease Father     80 s  . Cancer Brother     Prior to Admission medications   Medication Sig Start Date End Date Taking? Authorizing Provider  ARIPiprazole (ABILIFY) 5 MG tablet Take 5 mg by mouth at bedtime.   Yes Historical Provider, MD  aspirin EC 81 MG tablet Take 1 tablet (81 mg total) by mouth daily with breakfast. Resume in 2 weeks. 01/15/15  Yes Eugenie Filler, MD  atorvastatin (LIPITOR) 20 MG tablet Take 20 mg by mouth at bedtime.   Yes Historical Provider, MD  Calcium  Carbonate-Vitamin D 600-400 MG-UNIT per tablet Take 1 tablet by mouth daily with breakfast. 10/26/14 10/26/15 Yes Historical Provider, MD  Cholecalciferol (VITAMIN D) 2000 UNITS tablet Take 2,000 Units by mouth daily.   Yes Historical Provider, MD  fluticasone (FLONASE) 50 MCG/ACT nasal spray Place 2 sprays into both nostrils daily.   Yes Historical Provider, MD  gabapentin (NEURONTIN) 100 MG capsule Take 100 mg by mouth at bedtime.   Yes Historical Provider, MD  HYDROcodone-acetaminophen (NORCO/VICODIN) 5-325 MG per tablet Take 1 tablet by  mouth every 4 (four) hours as needed for moderate pain. 01/01/15  Yes Eugenie Filler, MD  hydroxypropyl methylcellulose / hypromellose (ISOPTO TEARS / GONIOVISC) 2.5 % ophthalmic solution Place 1 drop into both eyes every 2 (two) hours as needed for dry eyes.   Yes Historical Provider, MD  lisinopril (PRINIVIL,ZESTRIL) 20 MG tablet Take 20 mg by mouth daily.   Yes Historical Provider, MD  loratadine (CLARITIN) 10 MG tablet Take 10 mg by mouth daily.   Yes Historical Provider, MD  Melatonin 1 MG TABS Take 1 tablet by mouth at bedtime.   Yes Historical Provider, MD  Multiple Vitamins-Minerals (THERA-TABS M) TABS Take 1 tablet by mouth daily with breakfast.   Yes Historical Provider, MD  olopatadine (PATANOL) 0.1 % ophthalmic solution Place 1 drop into both eyes 2 (two) times daily.   Yes Historical Provider, MD  pantoprazole (PROTONIX) 40 MG tablet Take 1 tablet (40 mg total) by mouth 2 (two) times daily before a meal. Take 1 tablet 2 times daily x 4 weeks, then 1 tablet daily. Patient taking differently: Take 40 mg by mouth 2 (two) times daily before a meal.  01/01/15  Yes Eugenie Filler, MD  polyethylene glycol (MIRALAX / GLYCOLAX) packet Take 17 g by mouth daily.   Yes Historical Provider, MD  risperiDONE (RISPERDAL) 0.5 MG tablet Take 0.5-1 mg by mouth 2 (two) times daily. 1 tab in the morning and 2 tabs at bedtime   Yes Historical Provider, MD  senna (SENOKOT) 8.6 MG TABS tablet Take 1 tablet (8.6 mg total) by mouth at bedtime. 01/01/15  Yes Eugenie Filler, MD  sertraline (ZOLOFT) 50 MG tablet Take 50 mg by mouth daily.   Yes Historical Provider, MD  vitamin B-12 (CYANOCOBALAMIN) 500 MCG tablet Take 1,000 mcg by mouth daily.    Yes Historical Provider, MD  donepezil (ARICEPT) 5 MG tablet Take 1 tablet (5 mg total) by mouth at bedtime. Patient not taking: Reported on 04/17/2015 08/14/14   Kennyth Arnold, FNP  risperiDONE (RISPERDAL) 1 MG tablet Take 1 tablet (1 mg total) by mouth at  bedtime. Patient not taking: Reported on 04/17/2015 08/14/14   Kennyth Arnold, FNP  sulfamethoxazole-trimethoprim (BACTRIM DS,SEPTRA DS) 800-160 MG per tablet Take 1 tablet by mouth every 12 (twelve) hours. Take for 4 days then stop. Patient not taking: Reported on 04/17/2015 01/01/15   Eugenie Filler, MD    Physical Exam: Filed Vitals:   04/17/15 1900 04/17/15 1915 04/17/15 1930 04/17/15 2014  BP: 129/63  134/70 131/60  Pulse: 113 111 113 108  Temp:      TempSrc:      Resp: 20 19 20 20   Height:      Weight:      SpO2: 91% 89% 91% 97%    General: Alert, Awake and Oriented to Place and Person. Appear in moderate distress Eyes: PERRL, significant mucus crusting of both eyes. ENT: Oral Mucosa clear moist. Neck: no JVD  Cardiovascular: S1 and S2 Present, aortic systolic Murmur, Peripheral Pulses Present Respiratory: Bilateral Air entry equal and Decreased,  Bilateral rhonchi and Crackles, bilateral expiratory wheezes Abdomen: Bowel Sound present, Soft and no tenderness Skin: no Rash Extremities: no Pedal edema, no calf tenderness Neurologic: Grossly no focal neuro deficit.  Labs on Admission:  CBC:  Recent Labs Lab 04/17/15 1655  WBC 7.8  NEUTROABS 5.1  HGB 9.1*  HCT 30.2*  MCV 70.7*  PLT 250    CMP     Component Value Date/Time   NA 139 04/17/2015 1655   K 3.4* 04/17/2015 1655   CL 101 04/17/2015 1655   CO2 30 04/17/2015 1655   GLUCOSE 125* 04/17/2015 1655   GLUCOSE 102* 08/10/2006 0939   BUN 26* 04/17/2015 1655   CREATININE 1.53* 04/17/2015 1655   CALCIUM 10.1 04/17/2015 1655   CALCIUM 10.6* 01/21/2008 0244   PROT 6.9 04/17/2015 1655   ALBUMIN 3.4* 04/17/2015 1655   AST 43* 04/17/2015 1655   ALT 23 04/17/2015 1655   ALKPHOS 80 04/17/2015 1655   BILITOT 0.5 04/17/2015 1655   GFRNONAA 30* 04/17/2015 1655   GFRAA 35* 04/17/2015 1655    No results for input(s): LIPASE, AMYLASE in the last 168 hours.  No results for input(s): CKTOTAL, CKMB, CKMBINDEX,  TROPONINI in the last 168 hours. BNP (last 3 results)  Recent Labs  04/17/15 1655  BNP 125.7*    ProBNP (last 3 results) No results for input(s): PROBNP in the last 8760 hours.   Radiological Exams on Admission: Dg Chest Port 1 View  04/17/2015   CLINICAL DATA:  Pneumonia. Shortness of breath. Symptoms started today.  EXAM: PORTABLE CHEST - 1 VIEW  COMPARISON:  12/27/2014  FINDINGS: Bilateral indistinct pulmonary vasculature observed with hazy opacities in the right upper lobe and potentially in the left upper lobe.  The patient is rotated to the right on today's radiograph, reducing diagnostic sensitivity and specificity. Atherosclerotic aortic arch noted. Possible underlying hiatal hernia.  IMPRESSION: 1. Indistinct densities in the upper lobes, right greater than left, potentially from aspiration pneumonitis, multilobar pneumonia, or acute pulmonary edema. Upper normal heart size. 2. Retrocardiac density, possibly from a chronic hiatal hernia. 3. Rightward mediastinal prominence is primarily due to rightward rotation. 4. Followup radiography to ensure clearance is recommended.   Electronically Signed   By: Van Clines M.D.   On: 04/17/2015 17:04   EKG: Independently reviewed. sinus tachycardia.  Assessment/Plan Principal Problem:   Acute respiratory failure with hypoxia and hypercapnia Active Problems:   Essential hypertension   Dementia   HCAP (healthcare-associated pneumonia)   Acute-on-chronic kidney injury   Anemia   Sepsis   1. Acute respiratory failure with hypoxia and hypercapnia  Healthcare associated pneumonia. Possible aspiration. Possible bronchiolitis.  The patient is presenting with complaints of cough and shortness of breath as well as fever. History is significantly limited but examination reveals that she has bilateral rhonchi as well as expiratory wheezing. Chest x-ray also shows that she has multifocal pneumonia with suspicion for aspiration. She is  coming from and dementia [facility] living facility. She will be treated with broad-spectrum antibiotics. She remains nothing by mouth until speech evaluation. We will use BiPAP as needed. Currently she does not appear in respiratory distress. I would also use DuoNeb's as well as Solu-Medrol 60 mg every 6 hours.  Follow cultures.  2. Acute on chronic kidney injury. Mild worsening of serum creatinine from her baseline of 1. We will give her gentle IV hydration.  Avoid nephrotoxic medication and renal dozing off antibiotics per pharmacy.  3. History of hypertension. Currently holding her blood pressure medications.  4. Dyslipidemia. Currently holding Lipitor.  5. Dementia. mood disorder. Currently holding her oral medications monitor for agitation.  Advance goals of care discussion: Full code as per discussion by the ER provider to the case manager on-call for Department of Social Services. Recommended to discuss with the designated case manager for the patient in the morning about discussion about CODE STATUS   DVT Prophylaxis: subcutaneous Heparin Nutrition: Nothing by mouth  Disposition: Admitted as inpatient, step-down unit.  Author: Berle Mull, MD [facility] Pager: [phone] 04/17/2015  If 7PM-7AM, please contact night-coverage [email or web address] Password [email or web address]

## 2015-04-17 NOTE — Progress Notes (Signed)
ED CM consulted by EDP D Knott to assist with finding contact information for legal guardian for pt  Cm looked in [facility] ED Update section demographic to find [place]  [phone]   Cm called this number and the Dept of Social services legal guardian left instructions for contact after hours  If after hours call for SW on call at [phone] or Art gallery manager during business hrs is [phone] CM gave EDP this contact information after speaking with Gae Bon of the DSS answering service to have the on call SW to call D Knott at his ED mobile number Cm informed EDP the on call SW for DSS will contact him in regards to the pt

## 2015-04-17 NOTE — ED Notes (Signed)
Charge RN [facility] aware of pt being placed on bipap

## 2015-04-17 NOTE — ED Notes (Signed)
Bed alarm placed on bed. 

## 2015-04-17 NOTE — ED Notes (Signed)
Pt adjusted in bed for comfort and breathing

## 2015-04-18 ENCOUNTER — Inpatient Hospital Stay (HOSPITAL_COMMUNITY): Payer: Medicare Other

## 2015-04-18 DIAGNOSIS — J9601 Acute respiratory failure with hypoxia: Secondary | ICD-10-CM

## 2015-04-18 DIAGNOSIS — R0902 Hypoxemia: Secondary | ICD-10-CM | POA: Diagnosis present

## 2015-04-18 DIAGNOSIS — N189 Chronic kidney disease, unspecified: Secondary | ICD-10-CM

## 2015-04-18 DIAGNOSIS — I10 Essential (primary) hypertension: Secondary | ICD-10-CM

## 2015-04-18 DIAGNOSIS — N179 Acute kidney failure, unspecified: Secondary | ICD-10-CM

## 2015-04-18 LAB — CBC WITH DIFFERENTIAL/PLATELET
BASOS PCT: 0 % (ref 0–1)
Basophils Absolute: 0 10*3/uL (ref 0.0–0.1)
EOS PCT: 0 % (ref 0–5)
Eosinophils Absolute: 0 10*3/uL (ref 0.0–0.7)
HEMATOCRIT: 28.8 % — AB (ref 36.0–46.0)
HEMOGLOBIN: 8.4 g/dL — AB (ref 12.0–15.0)
LYMPHS PCT: 3 % — AB (ref 12–46)
Lymphs Abs: 0.3 10*3/uL — ABNORMAL LOW (ref 0.7–4.0)
MCH: 20.7 pg — ABNORMAL LOW (ref 26.0–34.0)
MCHC: 29.2 g/dL — ABNORMAL LOW (ref 30.0–36.0)
MCV: 70.9 fL — AB (ref 78.0–100.0)
MONOS PCT: 3 % (ref 3–12)
Monocytes Absolute: 0.3 10*3/uL (ref 0.1–1.0)
NEUTROS PCT: 94 % — AB (ref 43–77)
Neutro Abs: 7.7 10*3/uL (ref 1.7–7.7)
Platelets: 232 10*3/uL (ref 150–400)
RBC: 4.06 MIL/uL (ref 3.87–5.11)
RDW: 19.8 % — ABNORMAL HIGH (ref 11.5–15.5)
WBC: 8.3 10*3/uL (ref 4.0–10.5)

## 2015-04-18 LAB — STREP PNEUMONIAE URINARY ANTIGEN: STREP PNEUMO URINARY ANTIGEN: NEGATIVE

## 2015-04-18 LAB — COMPREHENSIVE METABOLIC PANEL
ALT: 22 U/L (ref 14–54)
ANION GAP: 7 (ref 5–15)
AST: 38 U/L (ref 15–41)
Albumin: 3 g/dL — ABNORMAL LOW (ref 3.5–5.0)
Alkaline Phosphatase: 73 U/L (ref 38–126)
BUN: 20 mg/dL (ref 6–20)
CHLORIDE: 106 mmol/L (ref 101–111)
CO2: 26 mmol/L (ref 22–32)
CREATININE: 1.03 mg/dL — AB (ref 0.44–1.00)
Calcium: 9.2 mg/dL (ref 8.9–10.3)
GFR calc non Af Amer: 49 mL/min — ABNORMAL LOW (ref >60)
GFR, EST AFRICAN AMERICAN: 57 mL/min — AB (ref >60)
Glucose, Bld: 192 mg/dL — ABNORMAL HIGH (ref 65–99)
Potassium: 3.8 mmol/L (ref 3.5–5.1)
SODIUM: 139 mmol/L (ref 135–145)
Total Bilirubin: 0.5 mg/dL (ref 0.3–1.2)
Total Protein: 6.2 g/dL — ABNORMAL LOW (ref 6.5–8.1)

## 2015-04-18 LAB — INFLUENZA PANEL BY PCR (TYPE A & B)
H1N1FLUPCR: NOT DETECTED
Influenza A By PCR: NEGATIVE
Influenza B By PCR: NEGATIVE

## 2015-04-18 LAB — I-STAT CG4 LACTIC ACID, ED: LACTIC ACID, VENOUS: 1.02 mmol/L (ref 0.5–2.0)

## 2015-04-18 MED ORDER — LISINOPRIL 20 MG PO TABS
20.0000 mg | ORAL_TABLET | Freq: Every day | ORAL | Status: DC
  Administered 2015-04-19 – 2015-04-21 (×3): 20 mg via ORAL
  Filled 2015-04-18 (×3): qty 1

## 2015-04-18 MED ORDER — CALCIUM CARBONATE-VITAMIN D 500-200 MG-UNIT PO TABS
1.0000 | ORAL_TABLET | Freq: Every day | ORAL | Status: DC
  Administered 2015-04-20 – 2015-04-24 (×5): 1 via ORAL
  Filled 2015-04-18 (×6): qty 1

## 2015-04-18 MED ORDER — PNEUMOCOCCAL VAC POLYVALENT 25 MCG/0.5ML IJ INJ
0.5000 mL | INJECTION | INTRAMUSCULAR | Status: AC
  Administered 2015-04-21: 0.5 mL via INTRAMUSCULAR
  Filled 2015-04-18 (×3): qty 0.5

## 2015-04-18 MED ORDER — SERTRALINE HCL 50 MG PO TABS
50.0000 mg | ORAL_TABLET | Freq: Every day | ORAL | Status: DC
  Administered 2015-04-20 – 2015-04-24 (×5): 50 mg via ORAL
  Filled 2015-04-18 (×6): qty 1

## 2015-04-18 MED ORDER — FUROSEMIDE 10 MG/ML IJ SOLN
40.0000 mg | Freq: Once | INTRAMUSCULAR | Status: AC
  Administered 2015-04-18: 40 mg via INTRAVENOUS
  Filled 2015-04-18: qty 4

## 2015-04-18 MED ORDER — MORPHINE SULFATE (PF) 2 MG/ML IV SOLN
1.0000 mg | Freq: Once | INTRAVENOUS | Status: AC
  Administered 2015-04-18: 1 mg via INTRAVENOUS
  Filled 2015-04-18: qty 1

## 2015-04-18 MED ORDER — ASPIRIN EC 81 MG PO TBEC
81.0000 mg | DELAYED_RELEASE_TABLET | Freq: Once | ORAL | Status: AC
Start: 2015-04-18 — End: 2015-04-19
  Administered 2015-04-19: 81 mg via ORAL
  Filled 2015-04-18: qty 1

## 2015-04-18 MED ORDER — FUROSEMIDE 10 MG/ML IJ SOLN
40.0000 mg | Freq: Once | INTRAMUSCULAR | Status: AC
  Administered 2015-04-19: 40 mg via INTRAVENOUS
  Filled 2015-04-18: qty 4

## 2015-04-18 MED ORDER — CALCIUM CARBONATE-VITAMIN D 600-400 MG-UNIT PO TABS: 1.0000 | ORAL_TABLET | Freq: Every day | ORAL | Status: DC

## 2015-04-18 MED ORDER — LEVALBUTEROL HCL 0.63 MG/3ML IN NEBU
0.6300 mg | INHALATION_SOLUTION | RESPIRATORY_TRACT | Status: AC
Start: 2015-04-18 — End: 2015-04-18
  Administered 2015-04-18: 0.63 mg via RESPIRATORY_TRACT
  Filled 2015-04-18: qty 3

## 2015-04-18 MED ORDER — ATORVASTATIN CALCIUM 10 MG PO TABS
20.0000 mg | ORAL_TABLET | Freq: Every day | ORAL | Status: DC
  Administered 2015-04-20 – 2015-04-23 (×4): 20 mg via ORAL
  Filled 2015-04-18 (×5): qty 2

## 2015-04-18 MED ORDER — VANCOMYCIN HCL IN DEXTROSE 1-5 GM/200ML-% IV SOLN
1000.0000 mg | INTRAVENOUS | Status: DC
  Administered 2015-04-18 – 2015-04-23 (×6): 1000 mg via INTRAVENOUS
  Filled 2015-04-18 (×7): qty 200

## 2015-04-18 MED ORDER — ASPIRIN EC 81 MG PO TBEC
81.0000 mg | DELAYED_RELEASE_TABLET | Freq: Every day | ORAL | Status: DC
  Filled 2015-04-18: qty 1

## 2015-04-18 MED ORDER — CALCIUM CARBONATE-VITAMIN D 500-200 MG-UNIT PO TABS
1.0000 | ORAL_TABLET | Freq: Once | ORAL | Status: AC
Start: 2015-04-18 — End: 2015-04-19
  Administered 2015-04-19: 1 via ORAL
  Filled 2015-04-18: qty 1

## 2015-04-18 NOTE — Progress Notes (Signed)
[place] for Vancomycin, Zosyn Indication: pneumonia  Allergies  Allergen Reactions  . Pollen Extract Other (See Comments)    Swollen Eyes & Runny Nose.    Patient Measurements: Height: 5' (152.4 cm) Weight: 165 lb (74.844 kg) IBW/kg (Calculated) : 45.5  Vital Signs: Temp: 99.1 F (37.3 C) (08/17 1410) Temp Source: Oral (08/17 1410) BP: 155/97 mmHg (08/17 1410) Pulse Rate: 80 (08/17 1410) Intake/Output from previous day: 08/16 0701 - 08/17 0700 In: 2300 [IV Piggyback:2300] Out: 150 [Urine:150]  Labs:  Recent Labs  04/17/15 1655 04/18/15 0516  WBC 7.8 8.3  HGB 9.1* 8.4*  PLT 250 232  CREATININE 1.53* 1.03*   Estimated Creatinine Clearance: 37.4 mL/min (by C-G formula based on Cr of 1.03).  No results for input(s): VANCOTROUGH, VANCOPEAK, VANCORANDOM, GENTTROUGH, GENTPEAK, GENTRANDOM, TOBRATROUGH, TOBRAPEAK, TOBRARND, AMIKACINPEAK, AMIKACINTROU, AMIKACIN in the last 72 hours.   Microbiology: Recent Results (from the past 720 hour(s))  Blood Culture (routine x 2)     Status: None (Preliminary result)   Collection Time: 04/17/15  4:55 PM  Result Value Ref Range Status   Specimen Description BLOOD RIGHT WRIST  Final   Special Requests BOTTLES DRAWN AEROBIC AND ANAEROBIC 5 CC EA  Final   Culture   Final    NO GROWTH < 24 HOURS Performed at [facility]    Report Status PENDING  Incomplete  Blood Culture (routine x 2)     Status: None (Preliminary result)   Collection Time: 04/17/15  4:57 PM  Result Value Ref Range Status   Specimen Description BLOOD LEFT ANTECUBITAL  Final   Special Requests BOTTLES DRAWN AEROBIC AND ANAEROBIC 5 CC EA  Final   Culture   Final    NO GROWTH < 24 HOURS Performed at [facility]    Report Status PENDING  Incomplete  Urine culture     Status: None (Preliminary result)   Collection Time: 04/17/15  6:03 PM  Result Value Ref Range Status   Specimen Description URINE, CATHETERIZED   Final   Special Requests NONE  Final   Culture   Final    NO GROWTH < 24 HOURS Performed at [facility]    Report Status PENDING  Incomplete    Medications:  Anti-infectives    Start     Dose/Rate Route Frequency Ordered Stop   04/18/15 1800  vancomycin (VANCOCIN) IVPB 750 mg/150 ml premix     750 mg 150 mL/hr over 60 Minutes Intravenous Every 24 hours 04/17/15 1819     04/18/15 0200  piperacillin-tazobactam (ZOSYN) IVPB 3.375 g     3.375 g 12.5 mL/hr over 240 Minutes Intravenous Every 8 hours 04/17/15 1819     04/17/15 1700  vancomycin (VANCOCIN) 1,250 mg in sodium chloride 0.9 % 250 mL IVPB     1,250 mg 166.7 mL/hr over 90 Minutes Intravenous STAT 04/17/15 1637 04/18/15 0030   04/17/15 1645  piperacillin-tazobactam (ZOSYN) IVPB 3.375 g  Status:  Discontinued     3.375 g 100 mL/hr over 30 Minutes Intravenous 3 times per day 04/17/15 1637 04/17/15 1818     Assessment: 25 yoF [facility] ED [facility] with fever, cough, and wheezing.  CXR shows potential aspiration pneumonitis, multilobar pneumonia, or acute pulmonary edema.  Pharmacy is consulted to dose vancomycin and Zosyn for HCAP.  Tm 100.3 WBC wnl SCr bump resolved; CrCl 37 d/t advanced age and short stature LA wnl  8/16 >> vanc >> 8/16 >>  zosyn >>  8/16 blood x 2: NGTD 8/16 urine: NGTD S. pneumo UAg: neg Legionella UAg: IP Flu panel neg   Goal of Therapy:  Vancomycin trough level 15-20 mcg/ml Appropriate abx dosing, eradication of infection.   Plan:   Continue Zosyn extended infusion  Increase vancomycin to 1000 mg q24 with improved renal function  Measure Vanc trough at steady state.  Follow up renal fxn, culture results, and clinical course.   Reuel Boom, PharmD, BCPS Pager: [phone] 04/18/2015, 3:27 PM

## 2015-04-18 NOTE — Evaluation (Addendum)
SLP Cancellation Note  Patient Details Name: LAURALEI CLOUSE MRN: [id number] DOB: 1929-11-15   Cancelled treatment:       Reason Eval/Treat Not Completed: Other (comment);Medical issues which prohibited therapy (pt with increased WOB, on 4 liters nasal cannula and congested per RN and has h/o dysphagia, will follow up in am)  Luanna Salk, [facility] SLP [phone]

## 2015-04-18 NOTE — ED Notes (Signed)
Pt given breakfast during downtime. Venti mask removed for this. Placed on 4L O2 by cannula, sats maintained at 95%. [place] left in place of venti mask.

## 2015-04-18 NOTE — Progress Notes (Signed)
Shift event: RN, Barnett Applebaum, paged this NP earlier because pt was tachypneic with RR 30, O2 sat 92% on 4LNC, with new lung crackles on exam. Pt placed on NRB and given Lasix 40mg  IV and Morphine 1mg  IV. CXR ordered and showed pulmonary edema.  This NP called RN back later to check on pt. Tachypnea resolved. Has voided since Lasix. Resting comfortably now. Can change O2 back to [place] now. Will reorder 40mg  Lasix at 6am.  Report left for attending in am.  Clance Boll, NP [facility]

## 2015-04-18 NOTE — ED Notes (Signed)
Respiratory called for Duoneb treatment

## 2015-04-18 NOTE — Progress Notes (Signed)
[facility] PROGRESS NOTE  Jamie [name] YQM:[id number] DOB: Feb 18, 1930 DOA: 04/17/2015 PCP: Kennyth Arnold, FNP  Assessment/Plan: 1. Acute respiratory failure with hypoxia 1. Suspected HCAP with ?aspiration and possible bronchiolitis 2. Pt is continued on empiric vancomycin and zosyn 3. Pt is continued on scheduled nebs and IV steroids 4. Thus far, pt has improved and is now on [place] at 4L 5. Cont monitor closely 2. CKD 1. Cr has improved from 1.5 to 1.0 overnight 2. Cont monitor closely 3. As renal function has improved, will resume lisinopril 3. HTN 1. bp stable thus far 2. Cont monitor 3. More elevated today. Will resume lisinopril as renal function has improved 4. HLD 1. Continue statin per home regimen 5. Dementia 1. Stable  Code Status: Full Family Communication: Pt in room (indicate person spoken with, relationship, and if by phone, the number) Disposition Plan: Pending   Consultants:    Procedures:    Antibiotics:  Vancomycin 8/17>>>  Zosyn 8/17>>>   HPI/Subjective: Confused this AM, unable to obtain  Objective: Filed Vitals:   04/18/15 1130 04/18/15 1200 04/18/15 1402 04/18/15 1410  BP: 138/74 142/74  155/97  Pulse: 77 78  80  Temp:    99.1 F (37.3 C)  TempSrc:    Oral  Resp: 20 23  20   Height:    5' (1.524 m)  Weight:    75.161 kg (165 lb 11.2 oz)  SpO2: 95% 95% 95%     Intake/Output Summary (Last 24 hours) at 04/18/15 1643 Last data filed at 04/18/15 1619  Gross per 24 hour  Intake   2300 ml  Output    350 ml  Net   1950 ml   Filed Weights   04/17/15 1651 04/18/15 1410  Weight: 74.844 kg (165 lb) 75.161 kg (165 lb 11.2 oz)    Exam:   General:  Awake, confused  Cardiovascular: regular, s1, s2  Respiratory: slightly increased resp effort, coarse breath sounds, end-expiratory wheezing  Abdomen: soft,nondistended  Musculoskeletal: perfused, no clubbing   Data Reviewed: Basic Metabolic Panel:  Recent Labs Lab  04/17/15 1655 04/18/15 0516  NA 139 139  K 3.4* 3.8  CL 101 106  CO2 30 26  GLUCOSE 125* 192*  BUN 26* 20  CREATININE 1.53* 1.03*  CALCIUM 10.1 9.2   Liver Function Tests:  Recent Labs Lab 04/17/15 1655 04/18/15 0516  AST 43* 38  ALT 23 22  ALKPHOS 80 73  BILITOT 0.5 0.5  PROT 6.9 6.2*  ALBUMIN 3.4* 3.0*   No results for input(s): LIPASE, AMYLASE in the last 168 hours. No results for input(s): AMMONIA in the last 168 hours. CBC:  Recent Labs Lab 04/17/15 1655 04/18/15 0516  WBC 7.8 8.3  NEUTROABS 5.1 7.7  HGB 9.1* 8.4*  HCT 30.2* 28.8*  MCV 70.7* 70.9*  PLT 250 232   Cardiac Enzymes: No results for input(s): CKTOTAL, CKMB, CKMBINDEX, TROPONINI in the last 168 hours. BNP (last 3 results)  Recent Labs  04/17/15 1655  BNP 125.7*    ProBNP (last 3 results) No results for input(s): PROBNP in the last 8760 hours.  CBG: No results for input(s): GLUCAP in the last 168 hours.  Recent Results (from the past 240 hour(s))  Blood Culture (routine x 2)     Status: None (Preliminary result)   Collection Time: 04/17/15  4:55 PM  Result Value Ref Range Status   Specimen Description BLOOD RIGHT WRIST  Final   Special Requests BOTTLES DRAWN AEROBIC AND ANAEROBIC  5 CC EA  Final   Culture   Final    NO GROWTH < 24 HOURS Performed at [facility]    Report Status PENDING  Incomplete  Blood Culture (routine x 2)     Status: None (Preliminary result)   Collection Time: 04/17/15  4:57 PM  Result Value Ref Range Status   Specimen Description BLOOD LEFT ANTECUBITAL  Final   Special Requests BOTTLES DRAWN AEROBIC AND ANAEROBIC 5 CC EA  Final   Culture   Final    NO GROWTH < 24 HOURS Performed at [facility]    Report Status PENDING  Incomplete  Urine culture     Status: None (Preliminary result)   Collection Time: 04/17/15  6:03 PM  Result Value Ref Range Status   Specimen Description URINE, CATHETERIZED  Final   Special Requests NONE  Final    Culture   Final    NO GROWTH < 24 HOURS Performed at [facility]    Report Status PENDING  Incomplete     Studies: Dg Chest Port 1 View  04/17/2015   CLINICAL DATA:  Pneumonia. Shortness of breath. Symptoms started today.  EXAM: PORTABLE CHEST - 1 VIEW  COMPARISON:  12/27/2014  FINDINGS: Bilateral indistinct pulmonary vasculature observed with hazy opacities in the right upper lobe and potentially in the left upper lobe.  The patient is rotated to the right on today's radiograph, reducing diagnostic sensitivity and specificity. Atherosclerotic aortic arch noted. Possible underlying hiatal hernia.  IMPRESSION: 1. Indistinct densities in the upper lobes, right greater than left, potentially from aspiration pneumonitis, multilobar pneumonia, or acute pulmonary edema. Upper normal heart size. 2. Retrocardiac density, possibly from a chronic hiatal hernia. 3. Rightward mediastinal prominence is primarily due to rightward rotation. 4. Followup radiography to ensure clearance is recommended.   Electronically Signed   By: Van Clines M.D.   On: 04/17/2015 17:04    Scheduled Meds: . [START ON 04/19/2015] aspirin EC  81 mg Oral Q breakfast  . atorvastatin  20 mg Oral QHS  . [START ON 04/19/2015] Calcium Carbonate-Vitamin D  1 tablet Oral Q breakfast  . guaiFENesin  600 mg Oral BID  . heparin  5,000 Units Subcutaneous 3 times per day  . ipratropium-albuterol  3 mL Nebulization Q6H  . lisinopril  20 mg Oral Daily  . methylPREDNISolone (SOLU-MEDROL) injection  60 mg Intravenous Q6H  . piperacillin-tazobactam (ZOSYN)  IV  3.375 g Intravenous Q8H  . [START ON 04/20/2015] pneumococcal 23 valent vaccine  0.5 mL Intramuscular Tomorrow-1000  . sertraline  50 mg Oral Daily  . vancomycin  1,000 mg Intravenous Q24H   Continuous Infusions: . sodium chloride 50 mL/hr at 04/17/15 2244    Principal Problem:   Acute respiratory failure with hypoxia and hypercapnia Active Problems:   Essential  hypertension   Dementia   Acute respiratory failure with hypoxia   HCAP (healthcare-associated pneumonia)   Acute-on-chronic kidney injury   Anemia   Sepsis   Hypoxia  CHIU, [place] [facility] Pager [phone]. If 7PM-7AM, please contact night-coverage at [email or web address], password [facility] 04/18/2015, 4:43 PM  LOS: 1 day

## 2015-04-18 NOTE — Progress Notes (Signed)
UR done. 

## 2015-04-18 NOTE — ED Notes (Signed)
Spoke w/ Hospitalist and he will be changing bed request to Telemetry.

## 2015-04-19 ENCOUNTER — Inpatient Hospital Stay (HOSPITAL_COMMUNITY): Payer: Medicare Other

## 2015-04-19 DIAGNOSIS — J189 Pneumonia, unspecified organism: Secondary | ICD-10-CM

## 2015-04-19 DIAGNOSIS — I509 Heart failure, unspecified: Secondary | ICD-10-CM

## 2015-04-19 DIAGNOSIS — R0902 Hypoxemia: Secondary | ICD-10-CM

## 2015-04-19 DIAGNOSIS — F0390 Unspecified dementia without behavioral disturbance: Secondary | ICD-10-CM

## 2015-04-19 LAB — BASIC METABOLIC PANEL
Anion gap: 12 (ref 5–15)
BUN: 20 mg/dL (ref 6–20)
CHLORIDE: 102 mmol/L (ref 101–111)
CO2: 30 mmol/L (ref 22–32)
CREATININE: 0.93 mg/dL (ref 0.44–1.00)
Calcium: 9.7 mg/dL (ref 8.9–10.3)
GFR, EST NON AFRICAN AMERICAN: 55 mL/min — AB (ref >60)
Glucose, Bld: 167 mg/dL — ABNORMAL HIGH (ref 65–99)
Potassium: 3 mmol/L — ABNORMAL LOW (ref 3.5–5.1)
Sodium: 144 mmol/L (ref 135–145)

## 2015-04-19 LAB — LEGIONELLA ANTIGEN, URINE

## 2015-04-19 LAB — CBC
HCT: 31.4 % — ABNORMAL LOW (ref 36.0–46.0)
HEMOGLOBIN: 9.3 g/dL — AB (ref 12.0–15.0)
MCH: 21 pg — AB (ref 26.0–34.0)
MCHC: 29.6 g/dL — AB (ref 30.0–36.0)
MCV: 70.9 fL — ABNORMAL LOW (ref 78.0–100.0)
PLATELETS: 317 10*3/uL (ref 150–400)
RBC: 4.43 MIL/uL (ref 3.87–5.11)
RDW: 19.8 % — ABNORMAL HIGH (ref 11.5–15.5)
WBC: 12.8 10*3/uL — ABNORMAL HIGH (ref 4.0–10.5)

## 2015-04-19 LAB — URINE CULTURE: Culture: NO GROWTH

## 2015-04-19 LAB — MAGNESIUM: MAGNESIUM: 1.5 mg/dL — AB (ref 1.7–2.4)

## 2015-04-19 LAB — TROPONIN I: Troponin I: 0.78 ng/mL (ref <0.031)

## 2015-04-19 MED ORDER — MAGNESIUM SULFATE 2 GM/50ML IV SOLN
2.0000 g | Freq: Once | INTRAVENOUS | Status: AC
  Administered 2015-04-19: 2 g via INTRAVENOUS
  Filled 2015-04-19: qty 50

## 2015-04-19 MED ORDER — POTASSIUM CHLORIDE 20 MEQ PO PACK: 40.0000 meq | PACK | Freq: Two times a day (BID) | ORAL | Status: DC

## 2015-04-19 MED ORDER — POTASSIUM CHLORIDE 10 MEQ/100ML IV SOLN
INTRAVENOUS | Status: AC
  Administered 2015-04-19: 10 meq
  Filled 2015-04-19: qty 100

## 2015-04-19 MED ORDER — FUROSEMIDE 10 MG/ML IJ SOLN
40.0000 mg | Freq: Two times a day (BID) | INTRAMUSCULAR | Status: DC
  Administered 2015-04-19 – 2015-04-20 (×2): 40 mg via INTRAVENOUS
  Filled 2015-04-19: qty 4

## 2015-04-19 MED ORDER — POTASSIUM CHLORIDE 10 MEQ/100ML IV SOLN
10.0000 meq | INTRAVENOUS | Status: AC
  Administered 2015-04-19 (×4): 10 meq via INTRAVENOUS
  Filled 2015-04-19 (×3): qty 100

## 2015-04-19 NOTE — Progress Notes (Signed)
HPI:  Other Pertinent Information: 81 yo female adm to [facility] with respiratory issues - increased shortness of breath - requiring Ventimask support - now on nasal cannula.  Pt had episode over night of tachycardia and increased respiratory issues - s/p lasix.  PMH + for obesity, gastric ulcer, HTN, HLD, prior smoker.  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  Swallow eval ordered.   No Data Recorded  Assessment / Plan / Recommendation [facility] IP CLINICAL IMPRESSIONS 04/19/2015  Therapy Diagnosis Mild oral pharyngeal  phase dysphagia  Clinical Impression Mild oral pharyngeal dysphagia resulting in delayed oral transiting due to weakness- worse with solids than liquids. Minimal oral residuals present with liquids without pt awareness- No aspiration/penetration of any consistency tested. Pt benefited from cues to swallow to clear. Pharyngeal swallow overall strong. Pt does have appearance of prominent cricopharyngeus. Of note, pt coughed during MBS with no barium visualized in larynx. Esophageal sweep x1 completed with appearance of adequate clearance. Barium tablet not tested due to pt reporting need to use urinal.  Recommend dys3/thin for energy conservation using aspiration precautions. Medicine with applesauce.using teach back, Educated pt to findings.       CH[facility] IP TREATMENT RECOMMENDATION 04/19/2015  Treatment Recommendations Therapy as outlined in treatment plan below     [facility] IP DIET RECOMMENDATION 04/19/2015  SLP Diet Recommendations Dysphagia 3 (Mech soft);Thin  Liquid Administration via Cup, straw   Medication Administration Whole meds with puree  Compensations Small sips/bites;Slow rate, intermittent dry swallow, rest if short of breath  Postural Changes and/or Swallow Maneuvers Reflux precautions      Luanna Salk, [place] [facility] SLP [phone]

## 2015-04-19 NOTE — Progress Notes (Signed)
MBS completed, full report to follow.  No aspiration/penetration of any consistency tested.  Delayed oral transiting across consistencies = worse with solids than liquids. Minimal oral residuals -  Pt benefited from cues to swallow to clear.  Pharyngeal swallow overall strong.  Pt does have appearance of prominent cricopharyngeus.  Of note, pt coughed during MBS with no barium visualized in larynx.  Esophageal sweep x1 completed with appearance of adequate clearance. Barium tablet due to pt reporting need to use urinal.    Recommend dys3 for energy conservation with thin.  Medicine with applesauce.    Luanna Salk, [place] [facility] SLP [phone]

## 2015-04-19 NOTE — Progress Notes (Signed)
Pt refused q4h eye care.

## 2015-04-19 NOTE — Progress Notes (Signed)
04/19/15  1015  Patient is HOH and states she does not want to be left alone in her room. Pt is a high fall risk. Patient bed alarm is on the most sensitive alarm. Pt bed alarm has gone off and by the time someone reached her room she was standing beside the bed. Spoke with charge RN Janett Billow regarding a Air cabin crew. Was told to call contact to see if family could come sit with patient due to hospital cut backs on sitters. Patient has a legal guardian, who I called and reached a voicemail where she has 48hrs to return calls or call he supervisor. Called Adria Devon the supervisor and left message on voicemail to call back regarding patient. Pt has frequent incontinent episodes.

## 2015-04-19 NOTE — Care Management Important Message (Signed)
Important Message  Patient Details  Name: Jamie [name] MRN: [id number] Date of Birth: 08-09-1930   Medicare Important Message Given:  Yes-second notification given    Camillo Flaming 04/19/2015, 1:14 [place] Message  Patient Details  Name: Jamie [name] MRN: [id number] Date of Birth: 1930/02/16   Medicare Important Message Given:  Yes-second notification given    Camillo Flaming 04/19/2015, 1:13 PM

## 2015-04-19 NOTE — Progress Notes (Signed)
*  PRELIMINARY RESULTS* Echocardiogram 2D Echocardiogram has been performed.  Jamie [name] 04/19/2015, 1:50 PM

## 2015-04-19 NOTE — Progress Notes (Signed)
MBS scheduled with xray today at approximately 12 noon.  RN informed. Luanna Salk, [place] [facility] SLP [phone]

## 2015-04-19 NOTE — Progress Notes (Signed)
[facility] PROGRESS NOTE  Jamie [name] RWE:[id number] DOB: 02-15-1930 DOA: 04/17/2015 PCP: Kennyth Arnold, FNP  Assessment/Plan: 1. Acute respiratory failure with hypoxia 1. Suspected HCAP with concerns for possible aspiration and possible bronchiolitis, also pulm edema 2. Pt is continued on empiric vancomycin and zosyn 3. Pt is continued on scheduled nebs and IV steroids 4. Worsened resp effort overnight with findings suggestive of pulm edema. Given lasix 5. 2d echo with reduced EF of 45-50%. Will continue scheduled IV lasix 6. O2 sats stable at [facility] 7. Cont to monitor closely. Patient does not appear to have increased work of breathing at this time 8. Will repeat CXR in AM for interval change 2. Possible acutely decompensated systolic CHF 1. EF 61-95% 2. CXR with findings suggestive of pulm edema 3. Will cont lasix as tolerated 4. Continue daily wts and i/o 5. Will check serial trop. Will check TSH 3. CKD 1. Cr has improved 2. Cont monitor closely 3. As renal function has improved, have resumed lisinopril 4. HTN 1. bp stable thus far 2. Cont monitor 3. More elevated today. Will resume lisinopril as renal function has improved 5. HLD 1. Continue statin per home regimen 6. Dementia 1. Stable 7. Hypokalemia 1. Likely secondary to diuresis this AM 2. Replace 8. Hypomagnesemia 1. replace  Code Status: Full Family Communication: Pt in room Disposition Plan: Pending   Consultants:    Procedures:    Antibiotics:  Vancomycin 8/17>>>  Zosyn 8/17>>>   HPI/Subjective: Confused today, cannot obtain  Objective: Filed Vitals:   04/19/15 0753 04/19/15 0910 04/19/15 1336 04/19/15 1454  BP:  156/73 166/90   Pulse:  95 103   Temp:   98.3 F (36.8 C)   TempSrc:   Oral   Resp:   21   Height:      Weight:      SpO2: 97% 97% 94% 94%    Intake/Output Summary (Last 24 hours) at 04/19/15 1746 Last data filed at 04/19/15 0800  Gross per 24 hour  Intake    841  ml  Output    600 ml  Net    241 ml   Filed Weights   04/17/15 1651 04/18/15 1410 04/19/15 0007  Weight: 74.844 kg (165 lb) 75.161 kg (165 lb 11.2 oz) 75.252 kg (165 lb 14.4 oz)    Exam:   General:  Awake, laying in bed, confused  Cardiovascular: regular, s1, s2  Respiratory: normal resp effort, coarse breath sounds  Abdomen: soft,nondistended  Musculoskeletal: perfused, no clubbing   Data Reviewed: Basic Metabolic Panel:  Recent Labs Lab 04/17/15 1655 04/18/15 0516 04/19/15 0610 04/19/15 0620  NA 139 139 144  --   K 3.4* 3.8 3.0*  --   CL 101 106 102  --   CO2 30 26 30   --   GLUCOSE 125* 192* 167*  --   BUN 26* 20 20  --   CREATININE 1.53* 1.03* 0.93  --   CALCIUM 10.1 9.2 9.7  --   MG  --   --   --  1.5*   Liver Function Tests:  Recent Labs Lab 04/17/15 1655 04/18/15 0516  AST 43* 38  ALT 23 22  ALKPHOS 80 73  BILITOT 0.5 0.5  PROT 6.9 6.2*  ALBUMIN 3.4* 3.0*   No results for input(s): LIPASE, AMYLASE in the last 168 hours. No results for input(s): AMMONIA in the last 168 hours. CBC:  Recent Labs Lab 04/17/15 1655 04/18/15 0516 04/19/15 0610  WBC  7.8 8.3 12.8*  NEUTROABS 5.1 7.7  --   HGB 9.1* 8.4* 9.3*  HCT 30.2* 28.8* 31.4*  MCV 70.7* 70.9* 70.9*  PLT 250 232 317   Cardiac Enzymes: No results for input(s): CKTOTAL, CKMB, CKMBINDEX, TROPONINI in the last 168 hours. BNP (last 3 results)  Recent Labs  04/17/15 1655  BNP 125.7*    ProBNP (last 3 results) No results for input(s): PROBNP in the last 8760 hours.  CBG: No results for input(s): GLUCAP in the last 168 hours.  Recent Results (from the past 240 hour(s))  Blood Culture (routine x 2)     Status: None (Preliminary result)   Collection Time: 04/17/15  4:55 PM  Result Value Ref Range Status   Specimen Description BLOOD RIGHT WRIST  Final   Special Requests BOTTLES DRAWN AEROBIC AND ANAEROBIC 5 CC EA  Final   Culture   Final    NO GROWTH 2 DAYS Performed at [facility]    Report Status PENDING  Incomplete  Blood Culture (routine x 2)     Status: None (Preliminary result)   Collection Time: 04/17/15  4:57 PM  Result Value Ref Range Status   Specimen Description BLOOD LEFT ANTECUBITAL  Final   Special Requests BOTTLES DRAWN AEROBIC AND ANAEROBIC 5 CC EA  Final   Culture   Final    NO GROWTH 2 DAYS Performed at [facility]    Report Status PENDING  Incomplete  Urine culture     Status: None   Collection Time: 04/17/15  6:03 PM  Result Value Ref Range Status   Specimen Description URINE, CATHETERIZED  Final   Special Requests NONE  Final   Culture   Final    NO GROWTH 2 DAYS Performed at [facility]    Report Status 04/19/2015 FINAL  Final     Studies: Dg Chest Port 1 View  04/18/2015   CLINICAL DATA:  Tachypnea and shortness of breath  EXAM: PORTABLE CHEST - 1 VIEW  COMPARISON:  Chest x-ray from yesterday  FINDINGS: Cardiomegaly. Prominent widening of the mediastinum from previously seen aortic tortuosity, accentuated by rightward rotation and vascular pedicle distension in the setting of volume overload.  Progressive diffuse interstitial opacity again with patchy right upper lobe airspace disease. No effusion.  IMPRESSION: 1. New pulmonary edema. 2. Persistent asymmetric right upper lobe opacity which could reflect edema or pneumonia. 3. Cardiomediastinal comparison limited by rotation.   Electronically Signed   By: Monte Fantasia M.D.   On: 04/18/2015 21:37   Dg Swallowing Func-speech Pathology  04/19/2015    Objective Swallowing Evaluation:    Patient Details  Name: Jamie [name] MRN: [id number] Date of Birth: 04/10/1930  Today's Date: 04/19/2015 Time: SLP Start Time (ACUTE ONLY): 1225-SLP Stop Time (ACUTE ONLY): 1245 SLP Time Calculation (min) (ACUTE ONLY): 20 min  Past Medical History:  Past Medical History  Diagnosis Date  . Hyperlipidemia   . Hypertension   . Obesity     5'3"  . Gastric ulcer 05/2011  . Chronic pain   . Anxiety    . Depression    Past Surgical History:  Past Surgical History  Procedure Laterality Date  . Bilateral oophorectomy    . Skin biopsy      nose lesion, noncancerous  . Esophagogastroduodenoscopy  08/12/2012    Procedure: ESOPHAGOGASTRODUODENOSCOPY (EGD);  Surgeon: Arta Silence,  MD;  Location: [facility];  Service: Endoscopy;  Laterality: Left;  . Abdominal hysterectomy  PARTIAL  . Esophagogastroduodenoscopy (egd) with propofol N/A 12/28/2014    Procedure: ESOPHAGOGASTRODUODENOSCOPY (EGD) WITH PROPOFOL;  Surgeon:  Wilford Corner, MD;  Location: [facility];  Service: Endoscopy;   Laterality: N/A;   HPI:  Other Pertinent Information: 81 yo female adm to [facility] with respiratory  issues - increased shortness of breath - requiring Ventimask support - now  on nasal cannula.  Pt had episode over night of tachycardia and increased  respiratory issues - s/p lasix.  PMH + for obesity, gastric ulcer, HTN,  HLD, prior smoker.  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  Swallow eval ordered.   No Data Recorded  Assessment / Plan / Recommendation CHL IP CLINICAL IMPRESSIONS 04/19/2015  Therapy Diagnosis Mild oral pharyngeal  phase dysphagia  Clinical Impression Mild oral pharyngeal dysphagia resulting in delayed  oral transiting due to weakness- worse with solids than liquids. Minimal  oral residuals present with liquids without pt awareness- No  aspiration/penetration of any consistency tested. Pt benefited from cues  to swallow to clear. Pharyngeal swallow overall strong. Pt does have  appearance of prominent cricopharyngeus. Of note, pt coughed during MBS  with no barium visualized in larynx. Esophageal sweep x1 completed with  appearance of adequate clearance. Barium tablet not tested due to pt  reporting need to use urinal.  Recommend dys3/thin for energy  conservation using aspiration precautions. Medicine with applesauce.using  teach back, Educated pt to findings.       CHL IP  TREATMENT RECOMMENDATION 04/19/2015  Treatment Recommendations Therapy as outlined in treatment plan below     CHL IP DIET RECOMMENDATION 04/19/2015  SLP Diet Recommendations Dysphagia 3 (Mech soft);Thin  Liquid Administration via Cup, straw   Medication Administration Whole meds with puree  Compensations Small sips/bites;Slow rate, intermittent dry swallow, rest  if short of breath  Postural Changes and/or Swallow Maneuvers Reflux precautions     CHL IP OTHER RECOMMENDATIONS 04/19/2015  Recommended Consults (None)  Oral Care Recommendations Oral care BID  Other Recommendations (None)     No flowsheet data found.   CHL IP FREQUENCY AND DURATION 04/19/2015  Speech Therapy Frequency (ACUTE ONLY) min 2x/week  Treatment Duration 1 week         CHL IP REASON FOR REFERRAL 04/19/2015  Reason for Referral Objectively evaluate swallowing function     CHL IP ORAL PHASE 04/19/2015  Oral Phase Impaired      CHL IP PHARYNGEAL PHASE 04/19/2015  Pharyngeal Phase Impaired      CHL IP CERVICAL ESOPHAGEAL PHASE 04/19/2015  Cervical Esophageal Phase Impaired  Nectar Cup Prominent cricopharyngeal segment  Thin Teaspoon Prominent cricopharyngeal segment  Thin Cup Prominent cricopharyngeal segment  Thin Straw Prominent cricopharyngeal segment  Cervical Esophageal Comment upon esophageal sweep, pt appeared clear -  radiologist not present to confirm    No flowsheet data found.         Luanna Salk, MS [facility] SLP [phone]     Scheduled Meds: . aspirin EC  81 mg Oral Q breakfast  . atorvastatin  20 mg Oral QHS  . calcium-vitamin D  1 tablet Oral Q breakfast  . calcium-vitamin D  1 tablet Oral Once  . guaiFENesin  600 mg Oral BID  . heparin  5,000 Units Subcutaneous 3 times per day  . ipratropium-albuterol  3 mL Nebulization Q6H  . lisinopril  20 mg Oral Daily  . methylPREDNISolone (SOLU-MEDROL) injection  60 mg Intravenous Q6H  . piperacillin-tazobactam (ZOSYN)  IV  3.375  g Intravenous Q8H  . [START ON 04/20/2015] pneumococcal 23 valent  vaccine  0.5 mL Intramuscular Tomorrow-1000  . potassium chloride      . sertraline  50 mg Oral Daily  . vancomycin  1,000 mg Intravenous Q24H   Continuous Infusions: . sodium chloride 30 mL (04/18/15 2103)    Principal Problem:   Acute respiratory failure with hypoxia and hypercapnia Active Problems:   Essential hypertension   Dementia   Acute respiratory failure with hypoxia   HCAP (healthcare-associated pneumonia)   Acute-on-chronic kidney injury   Anemia   Sepsis   Hypoxia  CH[facility], [place] [facility] Pager [phone]. If 7PM-7AM, please contact night-coverage at [email or web address], password [facility] 04/19/2015, 5:46 PM  LOS: 2 days

## 2015-04-19 NOTE — Evaluation (Signed)
Clinical/Bedside Swallow Evaluation Patient Details  Name: Jamie [name] MRN: [id number] Date of Birth: [birth year]  Today's Date: 04/19/2015 Time: SLP Start Time (ACUTE ONLY): 0841 SLP Stop Time (ACUTE ONLY): 0908 SLP Time Calculation (min) (ACUTE ONLY): 27 min  Past Medical History:  Past Medical History  Diagnosis Date  . Hyperlipidemia   . Hypertension   . Obesity     5'3"  . Gastric ulcer 05/2011  . Chronic pain   . Anxiety   . Depression    Past Surgical History:  Past Surgical History  Procedure Laterality Date  . Bilateral oophorectomy    . Skin biopsy      nose lesion, noncancerous  . Esophagogastroduodenoscopy  08/12/2012    Procedure: ESOPHAGOGASTRODUODENOSCOPY (EGD);  Surgeon: Arta Silence, MD;  Location: [facility];  Service: Endoscopy;  Laterality: Left;  . Abdominal hysterectomy      PARTIAL  . Esophagogastroduodenoscopy (egd) with propofol N/A 12/28/2014    Procedure: ESOPHAGOGASTRODUODENOSCOPY (EGD) WITH PROPOFOL;  Surgeon: Wilford Corner, MD;  Location: [facility];  Service: Endoscopy;  Laterality: N/A;   HPI:  81 yo female adm to [facility] with respiratory issues - increased shortness of breath - requiring Ventimask support - now on nasal cannula.  Pt had episode over night of tachycardia and increased respiratory issues - s/p lasix.  PMH + for obesity, gastric ulcer, HTN, HLD, prior smoker.  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  Swallow eval ordered.    Assessment / Plan / Recommendation Clinical Impression  Oral suction set up and used with pt prior to administration of po.  CN exam unremarkable but pt is grossly weak.  Pt demonstrating symptoms of possible laryngeal penetration/aspiration with po intake - SLP can not rule out at bedside.  Cough at baseline noted but also delayed cough with intake evident after 75% of boluses offered - ice, nectar, puree.    Given pt possible RUL opacity/pna, h/o large HH and symptoms at  bedside - will proceed with MBS.     Aspiration Risk    Moderate   Diet Recommendation NPO        Other  Recommendations Oral Care Recommendations: Oral care QID   Follow Up Recommendations       Frequency and Duration  (TBD)      Pertinent Vitals/Pain Afebrile decreased      Swallow Study Prior Functional Status    see [place] Date of Onset: 04/19/15 Other Pertinent Information: 81 yo female adm to [facility] with respiratory issues - increased shortness of breath - requiring Ventimask support - now on nasal cannula.  Pt had episode over night of tachycardia and increased respiratory issues - s/p lasix.  PMH + for obesity, gastric ulcer, HTN, HLD, prior smoker.  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  Swallow eval ordered.  Type of Study: Bedside swallow evaluation Diet Prior to this Study: NPO Temperature Spikes Noted: No Respiratory Status: Supplemental O2 delivered via (comment) History of Recent Intubation: No Behavior/Cognition: Alert;Cooperative;Confused Oral Cavity - Dentition: Adequate natural dentition/normal for age Self-Feeding Abilities: Needs assist Patient Positioning: Upright in bed Baseline Vocal Quality: Hoarse;Low vocal intensity Volitional Cough: Weak Volitional Swallow: Able to elicit    Oral/Motor/Sensory Function Overall Oral Motor/Sensory Function: Appears within functional limits for tasks assessed (except generalized weakness)   Ice Chips Ice chips: Impaired Presentation: Spoon Oral Phase Impairments: Impaired anterior to posterior transit;Reduced lingual movement/coordination Oral Phase Functional Implications: Prolonged oral transit Pharyngeal  Phase Impairments: Suspected delayed Swallow   Thin Liquid Thin Liquid: Not tested    Nectar Thick Nectar Thick Liquid: Impaired Presentation: Self Fed;Cup;Spoon Oral Phase Impairments: Impaired anterior to posterior transit;Reduced lingual  movement/coordination Pharyngeal Phase Impairments: Suspected delayed Swallow;Cough - Delayed   Honey Thick Honey Thick Liquid: Not tested   Puree Puree: Impaired Presentation: Spoon;Self Fed Oral Phase Impairments: Reduced lingual movement/coordination;Impaired anterior to posterior transit Oral Phase Functional Implications: Prolonged oral transit Pharyngeal Phase Impairments: Suspected delayed Swallow;Cough - Delayed   Solid   GO    Solid: Not tested       Luanna Salk, [place] [facility] SLP [phone]

## 2015-04-20 ENCOUNTER — Inpatient Hospital Stay (HOSPITAL_COMMUNITY): Payer: Medicare Other

## 2015-04-20 DIAGNOSIS — N179 Acute kidney failure, unspecified: Secondary | ICD-10-CM | POA: Insufficient documentation

## 2015-04-20 DIAGNOSIS — R0682 Tachypnea, not elsewhere classified: Secondary | ICD-10-CM | POA: Insufficient documentation

## 2015-04-20 LAB — CBC
HCT: 31.5 % — ABNORMAL LOW (ref 36.0–46.0)
HEMOGLOBIN: 9.4 g/dL — AB (ref 12.0–15.0)
MCH: 21.2 pg — AB (ref 26.0–34.0)
MCHC: 29.8 g/dL — ABNORMAL LOW (ref 30.0–36.0)
MCV: 71.1 fL — ABNORMAL LOW (ref 78.0–100.0)
Platelets: 381 10*3/uL (ref 150–400)
RBC: 4.43 MIL/uL (ref 3.87–5.11)
RDW: 19.9 % — ABNORMAL HIGH (ref 11.5–15.5)
WBC: 12.4 10*3/uL — AB (ref 4.0–10.5)

## 2015-04-20 LAB — BASIC METABOLIC PANEL
ANION GAP: 9 (ref 5–15)
BUN: 31 mg/dL — ABNORMAL HIGH (ref 6–20)
CO2: 36 mmol/L — ABNORMAL HIGH (ref 22–32)
Calcium: 9.6 mg/dL (ref 8.9–10.3)
Chloride: 97 mmol/L — ABNORMAL LOW (ref 101–111)
Creatinine, Ser: 1.14 mg/dL — ABNORMAL HIGH (ref 0.44–1.00)
GFR, EST AFRICAN AMERICAN: 50 mL/min — AB (ref >60)
GFR, EST NON AFRICAN AMERICAN: 43 mL/min — AB (ref >60)
GLUCOSE: 155 mg/dL — AB (ref 65–99)
POTASSIUM: 3.1 mmol/L — AB (ref 3.5–5.1)
Sodium: 142 mmol/L (ref 135–145)

## 2015-04-20 LAB — T4, FREE: FREE T4: 1.07 ng/dL (ref 0.61–1.12)

## 2015-04-20 LAB — TSH: TSH: 0.232 u[IU]/mL — ABNORMAL LOW (ref 0.350–4.500)

## 2015-04-20 LAB — TROPONIN I
TROPONIN I: 0.65 ng/mL — AB (ref <0.031)
Troponin I: 0.82 ng/mL (ref <0.031)

## 2015-04-20 LAB — VANCOMYCIN, TROUGH: VANCOMYCIN TR: 13 ug/mL (ref 10.0–20.0)

## 2015-04-20 LAB — MAGNESIUM: MAGNESIUM: 2 mg/dL (ref 1.7–2.4)

## 2015-04-20 MED ORDER — HALOPERIDOL LACTATE 5 MG/ML IJ SOLN
2.0000 mg | Freq: Four times a day (QID) | INTRAMUSCULAR | Status: DC | PRN
  Administered 2015-04-20 – 2015-04-24 (×6): 2 mg via INTRAVENOUS
  Filled 2015-04-20 (×6): qty 1

## 2015-04-20 MED ORDER — POTASSIUM CHLORIDE CRYS ER 20 MEQ PO TBCR
40.0000 meq | EXTENDED_RELEASE_TABLET | Freq: Two times a day (BID) | ORAL | Status: AC
  Administered 2015-04-20 (×2): 40 meq via ORAL
  Filled 2015-04-20: qty 2

## 2015-04-20 MED ORDER — IPRATROPIUM-ALBUTEROL 0.5-2.5 (3) MG/3ML IN SOLN: 3.0000 mL | RESPIRATORY_TRACT | Status: DC | PRN

## 2015-04-20 MED ORDER — ASPIRIN 300 MG RE SUPP
300.0000 mg | Freq: Once | RECTAL | Status: DC
  Filled 2015-04-20: qty 1

## 2015-04-20 MED ORDER — ASPIRIN 325 MG PO TABS
325.0000 mg | ORAL_TABLET | Freq: Once | ORAL | Status: AC
  Administered 2015-04-20: 325 mg via ORAL
  Filled 2015-04-20: qty 1

## 2015-04-20 MED ORDER — FUROSEMIDE 10 MG/ML IJ SOLN
40.0000 mg | Freq: Every day | INTRAMUSCULAR | Status: DC
  Administered 2015-04-21 – 2015-04-24 (×4): 40 mg via INTRAVENOUS
  Filled 2015-04-20 (×5): qty 4

## 2015-04-20 NOTE — Progress Notes (Signed)
[place] for Vancomycin, Zosyn Indication: pneumonia  Allergies  Allergen Reactions  . Pollen Extract Other (See Comments)    Swollen Eyes & Runny Nose.   Patient Measurements: Height: 5' (152.4 cm) Weight: 159 lb 3.2 oz (72.213 kg) IBW/kg (Calculated) : 45.5  Vital Signs: Temp: 97.9 F (36.6 C) (08/19 1243) Temp Source: Oral (08/19 1243) BP: 176/85 mmHg (08/19 1243) Pulse Rate: 71 (08/19 1243) Intake/Output from previous day: 08/18 0701 - 08/19 0700 In: 2100 [P.O.:480; I.V.:720; IV Piggyback:900] Out: 1660 [Urine:1660]  Labs:  Recent Labs  04/18/15 0516 04/19/15 0610 04/20/15 0805  WBC 8.3 12.8* 12.4*  HGB 8.4* 9.3* 9.4*  PLT 232 317 381  CREATININE 1.03* 0.93 1.14*   Estimated Creatinine Clearance: 33.2 mL/min (by C-G formula based on Cr of 1.14).   Recent Labs  04/20/15 1715  [place]    Microbiology: Recent Results (from the past 720 hour(s))  Blood Culture (routine x 2)     Status: None (Preliminary result)   Collection Time: 04/17/15  4:55 PM  Result Value Ref Range Status   Specimen Description BLOOD RIGHT WRIST  Final   Special Requests BOTTLES DRAWN AEROBIC AND ANAEROBIC 5 CC EA  Final   Culture   Final    NO GROWTH 3 DAYS Performed at [facility]    Report Status PENDING  Incomplete  Blood Culture (routine x 2)     Status: None (Preliminary result)   Collection Time: 04/17/15  4:57 PM  Result Value Ref Range Status   Specimen Description BLOOD LEFT ANTECUBITAL  Final   Special Requests BOTTLES DRAWN AEROBIC AND ANAEROBIC 5 CC EA  Final   Culture   Final    NO GROWTH 3 DAYS Performed at [facility]    Report Status PENDING  Incomplete  Urine culture     Status: None   Collection Time: 04/17/15  6:03 PM  Result Value Ref Range Status   Specimen Description URINE, CATHETERIZED  Final   Special Requests NONE  Final   Culture   Final    NO GROWTH 2 DAYS Performed at [facility]    Report Status 04/19/2015 FINAL  Final   Medications:  Anti-infectives    Start     Dose/Rate Route Frequency Ordered Stop   04/18/15 1800  vancomycin (VANCOCIN) IVPB 750 mg/150 ml premix  Status:  Discontinued     750 mg 150 mL/hr over 60 Minutes Intravenous Every 24 hours 04/17/15 1819 04/18/15 1529   04/18/15 1800  vancomycin (VANCOCIN) IVPB 1000 mg/200 mL premix     1,000 mg 200 mL/hr over 60 Minutes Intravenous Every 24 hours 04/18/15 1529     04/18/15 0200  piperacillin-tazobactam (ZOSYN) IVPB 3.375 g     3.375 g 12.5 mL/hr over 240 Minutes Intravenous Every 8 hours 04/17/15 1819     04/17/15 1700  vancomycin (VANCOCIN) 1,250 mg in sodium chloride 0.9 % 250 mL IVPB     1,250 mg 166.7 mL/hr over 90 Minutes Intravenous STAT 04/17/15 1637 04/18/15 0030   04/17/15 1645  piperacillin-tazobactam (ZOSYN) IVPB 3.375 g  Status:  Discontinued     3.375 g 100 mL/hr over 30 Minutes Intravenous 3 times per day 04/17/15 1637 04/17/15 1818     Assessment: 78 yoF [facility] ED [facility] from [facility] with fever, cough, and wheezing.  CXR shows potential aspiration pneumonitis, multilobar pneumonia, or acute pulmonary edema.  Pharmacy is consulted to dose vancomycin  and Zosyn for HCAP.  PCT/LA wnl. Afebrile WBC now elevated (SoluMedrol) SCr new smaller bump in SCr with CrCl now 33 ml/min 8/17 CXR shows persistent RUL opacity; still present in 8/19 CXR  8/16 >> vanc >> 8/16 >> zosyn >>  8/16 blood x 2: NGTD 8/16 urine: NGF S. pneumo UAg: neg Legionella UAg: neg Flu panel neg  Dose changes/levels: Vancomycin trough 13 mcg/ml prior to 3rd dose 1g q24 Increased vanc to 1g q24 with improved renal function  Goal of Therapy:  Vancomycin trough level 15-20 mcg/ml Appropriate abx dosing, eradication of infection.   Plan:   Continue Zosyn extended infusion  Continue vancomycin 1000 mg q24 hr, anticipate accumulation with poor renal function  Follow up renal fxn, culture  results, and clinical course.  Minda Ditto PharmD Pager [phone] 04/20/2015, 6:43 PM

## 2015-04-20 NOTE — Plan of Care (Signed)
[facility] PROGRESS NOTE  Jamie [name]  EOF:[id number]  DOB: 1930/04/22  DOS: 04/20/2015  PCP: Kennyth Arnold, FNP  Assessment and Plan: The patient's 2 sets of troponin were consistently elevated. Troponin at the time of admission was negative. Discussed with cardiology on phone who recommends to continue conservative management with aspirin and Lipitor and beta blocker. No indication for anticoagulation. Cardiology suspect this demand ischemia instead of NSTEMI.  Author: Berle Mull, MD [facility] Pager: [phone] 04/20/2015 5:22 AM   If 7PM-7AM, please contact night-coverage at [email or web address], password [facility]

## 2015-04-20 NOTE — Progress Notes (Signed)
TX scanned by RN but administered by RT

## 2015-04-20 NOTE — Clinical Documentation Improvement (Signed)
Hospitalist  Can the diagnosis of CKD be further specified?   CKD Stage I - GFR greater than or equal to 90  CKD Stage II - GFR 60-89  CKD Stage III - GFR 30-59  CKD Stage IV - GFR 15-29  CKD Stage V - GFR < 15  ESRD (End Stage Renal Disease)  Other condition  Unable to clinically determine   Supporting Information: : (risk factors, signs and symptoms, diagnostics, treatment) 1. 04/19/15 progr note..."CKD , 1. Cr has improved, 2. Cont monitor closely. As renal function has improved,"..   Ref. Range 04/17/2015 16:55 04/18/2015 05:16 04/19/2015 06:10 04/20/2015 08:05  BUN Latest Ref Range: 6-20 mg/dL 26 (H) 20 20 31  (H)  Creatinine Latest Ref Range: 0.44-1.00 mg/dL 1.53 (H) 1.03 (H) 0.93 1.14 (H)  EGFR (Non-African Amer.) Latest Ref Range: >60 mL/min 30 (L) 49 (L) 55 (L) 43 (L)    Please exercise your independent, professional judgment when responding. A specific answer is not anticipated or expected.   Thank You, Ermelinda Das, RN, BSN, CCDS Certified Clinical Documentation Specialist Pager: [place]: Health Information Management

## 2015-04-20 NOTE — Progress Notes (Signed)
[place] for Vancomycin, Zosyn Indication: pneumonia  Allergies  Allergen Reactions  . Pollen Extract Other (See Comments)    Swollen Eyes & Runny Nose.    Patient Measurements: Height: 5' (152.4 cm) Weight: 159 lb 3.2 oz (72.213 kg) IBW/kg (Calculated) : 45.5  Vital Signs: Temp: 97.9 F (36.6 C) (08/19 1243) Temp Source: Oral (08/19 1243) BP: 176/85 mmHg (08/19 1243) Pulse Rate: 71 (08/19 1243) Intake/Output from previous day: 08/18 0701 - 08/19 0700 In: 2100 [P.O.:480; I.V.:720; IV Piggyback:900] Out: 1660 [Urine:1660]  Labs:  Recent Labs  04/18/15 0516 04/19/15 0610 04/20/15 0805  WBC 8.3 12.8* 12.4*  HGB 8.4* 9.3* 9.4*  PLT 232 317 381  CREATININE 1.03* 0.93 1.14*   Estimated Creatinine Clearance: 33.2 mL/min (by C-G formula based on Cr of 1.14).  No results for input(s): VANCOTROUGH, VANCOPEAK, VANCORANDOM, GENTTROUGH, GENTPEAK, GENTRANDOM, TOBRATROUGH, TOBRAPEAK, TOBRARND, AMIKACINPEAK, AMIKACINTROU, AMIKACIN in the last 72 hours.   Microbiology: Recent Results (from the past 720 hour(s))  Blood Culture (routine x 2)     Status: None (Preliminary result)   Collection Time: 04/17/15  4:55 PM  Result Value Ref Range Status   Specimen Description BLOOD RIGHT WRIST  Final   Special Requests BOTTLES DRAWN AEROBIC AND ANAEROBIC 5 CC EA  Final   Culture   Final    NO GROWTH 2 DAYS Performed at [facility]    Report Status PENDING  Incomplete  Blood Culture (routine x 2)     Status: None (Preliminary result)   Collection Time: 04/17/15  4:57 PM  Result Value Ref Range Status   Specimen Description BLOOD LEFT ANTECUBITAL  Final   Special Requests BOTTLES DRAWN AEROBIC AND ANAEROBIC 5 CC EA  Final   Culture   Final    NO GROWTH 2 DAYS Performed at [facility]    Report Status PENDING  Incomplete  Urine culture     Status: None   Collection Time: 04/17/15  6:03 PM  Result Value Ref Range Status   Specimen  Description URINE, CATHETERIZED  Final   Special Requests NONE  Final   Culture   Final    NO GROWTH 2 DAYS Performed at [facility]    Report Status 04/19/2015 FINAL  Final    Medications:  Anti-infectives    Start     Dose/Rate Route Frequency Ordered Stop   04/18/15 1800  vancomycin (VANCOCIN) IVPB 750 mg/150 ml premix  Status:  Discontinued     750 mg 150 mL/hr over 60 Minutes Intravenous Every 24 hours 04/17/15 1819 04/18/15 1529   04/18/15 1800  vancomycin (VANCOCIN) IVPB 1000 mg/200 mL premix     1,000 mg 200 mL/hr over 60 Minutes Intravenous Every 24 hours 04/18/15 1529     04/18/15 0200  piperacillin-tazobactam (ZOSYN) IVPB 3.375 g     3.375 g 12.5 mL/hr over 240 Minutes Intravenous Every 8 hours 04/17/15 1819     04/17/15 1700  vancomycin (VANCOCIN) 1,250 mg in sodium chloride 0.9 % 250 mL IVPB     1,250 mg 166.7 mL/hr over 90 Minutes Intravenous STAT 04/17/15 1637 04/18/15 0030   04/17/15 1645  piperacillin-tazobactam (ZOSYN) IVPB 3.375 g  Status:  Discontinued     3.375 g 100 mL/hr over 30 Minutes Intravenous 3 times per day 04/17/15 1637 04/17/15 1818     Assessment: 82 yoF presented to ED on 8/16 from SNF with fever, cough, and wheezing.  CXR shows potential aspiration pneumonitis,  multilobar pneumonia, or acute pulmonary edema.  Pharmacy is consulted to dose vancomycin and Zosyn for HCAP.  PCT/LA wnl. Afebrile WBC now elevated (SoluMedrol) SCr new smaller bump in SCr with CrCl now 33 ml/min 8/17 CXR shows persistent RUL opacity; still present in 8/19 CXR  8/16 >> vanc >> 8/16 >> zosyn >>  8/16 blood x 2: NGTD 8/16 urine: NGF S. pneumo UAg: neg Legionella UAg: neg Flu panel neg  Dose changes/levels: Increased vanc to 1g q24 with improved renal function  Goal of Therapy:  Vancomycin trough level 15-20 mcg/ml Appropriate abx dosing, eradication of infection.   Plan:   Continue Zosyn extended infusion  Continue vancomycin 1000 mg q24  hr  Check vancomycin trough tonight  Follow up renal fxn, culture results, and clinical course.   Reuel Boom, PharmD, BCPS Pager: [phone] 04/20/2015, 1:10 PM

## 2015-04-20 NOTE — Progress Notes (Signed)
OT Cancellation Note  Patient Details Name: Jamie [name] MRN: [id number] DOB: 1930/02/07   Cancelled Treatment:    Reason Eval/Treat Not Completed: Other (comment).  Spoke to [employer]. Waiting for cardiology to see pt (troponin trending up) and pt has been confused and in restraints. Will check back.    Kasson Lamere 04/20/2015, 9:05 AM  Lesle Chris, OTR/L [phone] 04/20/2015

## 2015-04-20 NOTE — Progress Notes (Signed)
CSW continuing to follow.   Pt admitted from [facility].   CSW spoke with pt legal guardian, [name] and provided update. CSW discussed with pt legal guardian that CSW awaiting PT evaluation, but pt may require rehab at SNF prior to return to [facility]. Pt legal guardian stated that if PT recommends SNF then pt legal guardian is agreeable to [facility] search.  CSW to follow up on PT recommendations and assist as appropriate.   CSW to continue to follow.  [name], MSW, [place] [employer] [phone]

## 2015-04-20 NOTE — Progress Notes (Signed)
SLP Cancellation Note  Patient Details Name: Jamie [name] MRN: [id number] DOB: 12-09-1929   Cancelled treatment:       Reason Eval/Treat Not Completed: Medical issues which prohibited therapy;Other (comment) (pt npo due to increased troponins per slp conversation with RN, will reattempt follow up another time)   Luanna Salk, [place] [facility] SLP [phone]

## 2015-04-20 NOTE — Progress Notes (Addendum)
[facility] PROGRESS NOTE  Jamie [name] FAO:[id number] DOB: 03/21/1930 DOA: 04/17/2015 PCP: Kennyth Arnold, FNP  Assessment/Plan: 1. Acute respiratory failure with hypoxia 1. Suspected HCAP with concerns for possible aspiration and possible bronchiolitis, also pulm edema 2. Pt is continued on empiric vancomycin and zosyn 3. Pt is continued on scheduled nebs and IV steroids 4. Worsened resp effort overnight with findings suggestive of pulm edema. Given lasix.  5. 2d echo with reduced EF of 45-50%. Continued scheduled IV lasix, but will decrease from bid to qday as Cr has risen to 1.14 6. O2 sats stable at [facility], continue to wean O2 7. Patient with normal resp effort today 8. Repeat CXR without significant change 2. Possible acutely decompensated systolic CHF 1. EF 69-62% 2. CXR with findings suggestive of pulm edema 3. Will cont lasix as tolerated, dose decreased from BID to Qday given bump in Cr 4. Continue daily wts and i/o 5. Will check serial trop. Will check TSH 3. CKD, stage 1 1. Cr has improved 2. Cont monitor closely 3. As renal function has improved, have resumed lisinopril 4. HTN 1. bp stable thus far 2. Cont monitor 3. Continued on lisinopril 5. HLD 1. Continue statin per home regimen 6. Dementia 1. Stable 7. Hypokalemia 1. Likely secondary to diuresis 2. Replace 3. Cont to follow lytes and correct as needed 8. Hypomagnesemia 1. Replaced 9. Elevated Troponin 1. Peak trop overnight of 0.82, now trending down 2. Case was discussed with Cardiology overnight, with recs that elevated trop is likely trop leak from acute illness 3. Cont to monitor trends  Code Status: Full Family Communication: Pt in room Disposition Plan: Pending   Consultants:    Procedures:    Antibiotics:  Vancomycin 8/17>>>  Zosyn 8/17>>>   HPI/Subjective: Eager to go home.   Objective: Filed Vitals:   04/19/15 2130 04/20/15 0508 04/20/15 0950 04/20/15 1243  BP:  157/72   176/85  Pulse:  79  71  Temp:  97.9 F (36.6 C)  97.9 F (36.6 C)  TempSrc:  Axillary  Oral  Resp:  22  18  Height:      Weight:  72.213 kg (159 lb 3.2 oz)    SpO2: 95% 100% 95% 96%    Intake/Output Summary (Last 24 hours) at 04/20/15 1521 Last data filed at 04/20/15 1303  Gross per 24 hour  Intake   1600 ml  Output   2260 ml  Net   -660 ml   Filed Weights   04/18/15 1410 04/19/15 0007 04/20/15 0508  Weight: 75.161 kg (165 lb 11.2 oz) 75.252 kg (165 lb 14.4 oz) 72.213 kg (159 lb 3.2 oz)    Exam:   General:  Awake, laying in bed, in nad  Cardiovascular: regular, s1, s2  Respiratory: normal resp effort, coarse breath sounds  Abdomen: soft,nondistended, obese  Musculoskeletal: perfused, no clubbing   Data Reviewed: Basic Metabolic Panel:  Recent Labs Lab 04/17/15 1655 04/18/15 0516 04/19/15 0610 04/19/15 0620 04/20/15 0805  NA 139 139 144  --  142  K 3.4* 3.8 3.0*  --  3.1*  CL 101 106 102  --  97*  CO2 30 26 30   --  36*  GLUCOSE 125* 192* 167*  --  155*  BUN 26* 20 20  --  31*  CREATININE 1.53* 1.03* 0.93  --  1.14*  CALCIUM 10.1 9.2 9.7  --  9.6  MG  --   --   --  1.5* 2.0  Liver Function Tests:  Recent Labs Lab 04/17/15 1655 04/18/15 0516  AST 43* 38  ALT 23 22  ALKPHOS 80 73  BILITOT 0.5 0.5  PROT 6.9 6.2*  ALBUMIN 3.4* 3.0*   No results for input(s): LIPASE, AMYLASE in the last 168 hours. No results for input(s): AMMONIA in the last 168 hours. CBC:  Recent Labs Lab 04/17/15 1655 04/18/15 0516 04/19/15 0610 04/20/15 0805  WBC 7.8 8.3 12.8* 12.4*  NEUTROABS 5.1 7.7  --   --   HGB 9.1* 8.4* 9.3* 9.4*  HCT 30.2* 28.8* 31.4* 31.5*  MCV 70.7* 70.9* 70.9* 71.1*  PLT 250 232 317 381   Cardiac Enzymes:  Recent Labs Lab 04/19/15 1941 04/19/15 2358 04/20/15 0805  TROPONINI 0.78* 0.82* 0.65*   BNP (last 3 results)  Recent Labs  04/17/15 1655  BNP 125.7*    ProBNP (last 3 results) No results for input(s): PROBNP in the  last 8760 hours.  CBG: No results for input(s): GLUCAP in the last 168 hours.  Recent Results (from the past 240 hour(s))  Blood Culture (routine x 2)     Status: None (Preliminary result)   Collection Time: 04/17/15  4:55 PM  Result Value Ref Range Status   Specimen Description BLOOD RIGHT WRIST  Final   Special Requests BOTTLES DRAWN AEROBIC AND ANAEROBIC 5 CC EA  Final   Culture   Final    NO GROWTH 3 DAYS Performed at [facility]    Report Status PENDING  Incomplete  Blood Culture (routine x 2)     Status: None (Preliminary result)   Collection Time: 04/17/15  4:57 PM  Result Value Ref Range Status   Specimen Description BLOOD LEFT ANTECUBITAL  Final   Special Requests BOTTLES DRAWN AEROBIC AND ANAEROBIC 5 CC EA  Final   Culture   Final    NO GROWTH 3 DAYS Performed at [facility]    Report Status PENDING  Incomplete  Urine culture     Status: None   Collection Time: 04/17/15  6:03 PM  Result Value Ref Range Status   Specimen Description URINE, CATHETERIZED  Final   Special Requests NONE  Final   Culture   Final    NO GROWTH 2 DAYS Performed at [facility]    Report Status 04/19/2015 FINAL  Final     Studies: Dg Chest Port 1 View  04/20/2015   CLINICAL DATA:  Congestive heart failure.  Confusion.  EXAM: PORTABLE CHEST - 1 VIEW  COMPARISON:  One-view chest x-ray 04/18/2015.  FINDINGS: The heart is enlarged. The aorta is prominent. Moderate interstitial edema is again noted. Recurrent right upper lobe airspace disease is evident. Mild left basilar airspace opacities are noted as well.  IMPRESSION: 1. Cardiomegaly and persistent mild interstitial edema. 2. Right upper lobe airspace disease is concerning for pneumonia. 3. Mild left basilar airspace disease.   Electronically Signed   By: San Morelle M.D.   On: 04/20/2015 07:14   Dg Chest Port 1 View  04/18/2015   CLINICAL DATA:  Tachypnea and shortness of breath  EXAM: PORTABLE CHEST - 1 VIEW   COMPARISON:  Chest x-ray from yesterday  FINDINGS: Cardiomegaly. Prominent widening of the mediastinum from previously seen aortic tortuosity, accentuated by rightward rotation and vascular pedicle distension in the setting of volume overload.  Progressive diffuse interstitial opacity again with patchy right upper lobe airspace disease. No effusion.  IMPRESSION: 1. New pulmonary edema. 2. Persistent asymmetric right upper lobe  opacity which could reflect edema or pneumonia. 3. Cardiomediastinal comparison limited by rotation.   Electronically Signed   By: Monte Fantasia M.D.   On: 04/18/2015 21:37   Dg Swallowing Func-speech Pathology  04/19/2015    Objective Swallowing Evaluation:    Patient Details  Name: Jamie [name] MRN: [id number] Date of Birth: 06-18-1930  Today's Date: 04/19/2015 Time: SLP Start Time (ACUTE ONLY): 1225-SLP Stop Time (ACUTE ONLY): 1245 SLP Time Calculation (min) (ACUTE ONLY): 20 min  Past Medical History:  Past Medical History  Diagnosis Date  . Hyperlipidemia   . Hypertension   . Obesity     5'3"  . Gastric ulcer 05/2011  . Chronic pain   . Anxiety   . Depression    Past Surgical History:  Past Surgical History  Procedure Laterality Date  . Bilateral oophorectomy    . Skin biopsy      nose lesion, noncancerous  . Esophagogastroduodenoscopy  08/12/2012    Procedure: ESOPHAGOGASTRODUODENOSCOPY (EGD);  Surgeon: Arta Silence,  MD;  Location: [facility];  Service: Endoscopy;  Laterality: Left;  . Abdominal hysterectomy      PARTIAL  . Esophagogastroduodenoscopy (egd) with propofol N/A 12/28/2014    Procedure: ESOPHAGOGASTRODUODENOSCOPY (EGD) WITH PROPOFOL;  Surgeon:  Wilford Corner, MD;  Location: [facility];  Service: Endoscopy;   Laterality: N/A;   HPI:  Other Pertinent Information: 81 yo female adm to [facility] with respiratory  issues - increased shortness of breath - requiring Ventimask support - now  on nasal cannula.  Pt had episode over night of tachycardia and increased  respiratory  issues - s/p lasix.  PMH + for obesity, gastric ulcer, HTN,  HLD, prior smoker.  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  Swallow eval ordered.   No Data Recorded  Assessment / Plan / Recommendation CHL IP CLINICAL IMPRESSIONS 04/19/2015  Therapy Diagnosis Mild oral pharyngeal  phase dysphagia  Clinical Impression Mild oral pharyngeal dysphagia resulting in delayed  oral transiting due to weakness- worse with solids than liquids. Minimal  oral residuals present with liquids without pt awareness- No  aspiration/penetration of any consistency tested. Pt benefited from cues  to swallow to clear. Pharyngeal swallow overall strong. Pt does have  appearance of prominent cricopharyngeus. Of note, pt coughed during MBS  with no barium visualized in larynx. Esophageal sweep x1 completed with  appearance of adequate clearance. Barium tablet not tested due to pt  reporting need to use urinal.  Recommend dys3/thin for energy  conservation using aspiration precautions. Medicine with applesauce.using  teach back, Educated pt to findings.       CHL IP TREATMENT RECOMMENDATION 04/19/2015  Treatment Recommendations Therapy as outlined in treatment plan below     CHL IP DIET RECOMMENDATION 04/19/2015  SLP Diet Recommendations Dysphagia 3 (Mech soft);Thin  Liquid Administration via Cup, straw   Medication Administration Whole meds with puree  Compensations Small sips/bites;Slow rate, intermittent dry swallow, rest  if short of breath  Postural Changes and/or Swallow Maneuvers Reflux precautions     CHL IP OTHER RECOMMENDATIONS 04/19/2015  Recommended Consults (None)  Oral Care Recommendations Oral care BID  Other Recommendations (None)     No flowsheet data found.   CHL IP FREQUENCY AND DURATION 04/19/2015  Speech Therapy Frequency (ACUTE ONLY) min 2x/week  Treatment Duration 1 week         CHL IP REASON FOR REFERRAL 04/19/2015  Reason for Referral Objectively evaluate swallowing function     CHL  IP  ORAL PHASE 04/19/2015  Oral Phase Impaired      CHL IP PHARYNGEAL PHASE 04/19/2015  Pharyngeal Phase Impaired      CHL IP CERVICAL ESOPHAGEAL PHASE 04/19/2015  Cervical Esophageal Phase Impaired  Nectar Cup Prominent cricopharyngeal segment  Thin Teaspoon Prominent cricopharyngeal segment  Thin Cup Prominent cricopharyngeal segment  Thin Straw Prominent cricopharyngeal segment  Cervical Esophageal Comment upon esophageal sweep, pt appeared clear -  radiologist not present to confirm    No flowsheet data found.         Luanna Salk, MS [facility] SLP [phone]     Scheduled Meds: . atorvastatin  20 mg Oral QHS  . calcium-vitamin D  1 tablet Oral Q breakfast  . [START ON 04/21/2015] furosemide  40 mg Intravenous Daily  . guaiFENesin  600 mg Oral BID  . heparin  5,000 Units Subcutaneous 3 times per day  . ipratropium-albuterol  3 mL Nebulization Q6H  . lisinopril  20 mg Oral Daily  . methylPREDNISolone (SOLU-MEDROL) injection  60 mg Intravenous Q6H  . piperacillin-tazobactam (ZOSYN)  IV  3.375 g Intravenous Q8H  . pneumococcal 23 valent vaccine  0.5 mL Intramuscular Tomorrow-1000  . potassium chloride  40 mEq Oral BID  . sertraline  50 mg Oral Daily  . vancomycin  1,000 mg Intravenous Q24H   Continuous Infusions: . sodium chloride 30 mL (04/18/15 2103)    Principal Problem:   Acute respiratory failure with hypoxia and hypercapnia Active Problems:   Essential hypertension   Dementia   Acute respiratory failure with hypoxia   HCAP (healthcare-associated pneumonia)   Acute-on-chronic kidney injury   Anemia   Sepsis   Hypoxia  Trase Bunda, [place] [facility] Pager [phone]. If 7PM-7AM, please contact night-coverage at [email or web address], password [facility] 04/20/2015, 3:21 PM  LOS: 3 days

## 2015-04-20 NOTE — Progress Notes (Signed)
PT Cancellation Note  Patient Details Name: Jamie [name] MRN: [id number] DOB: 17-Nov-1929   Cancelled Treatment:    Reason Eval/Treat Not Completed: Medical issues which prohibited therapy (troponins trending up, hold PT/OT per RN. Will follow. )   [name] 04/20/2015, 9:06 AM [phone]

## 2015-04-21 DIAGNOSIS — J9601 Acute respiratory failure with hypoxia: Secondary | ICD-10-CM | POA: Diagnosis not present

## 2015-04-21 DIAGNOSIS — I5023 Acute on chronic systolic (congestive) heart failure: Secondary | ICD-10-CM | POA: Diagnosis not present

## 2015-04-21 DIAGNOSIS — E785 Hyperlipidemia, unspecified: Secondary | ICD-10-CM | POA: Diagnosis not present

## 2015-04-21 DIAGNOSIS — I129 Hypertensive chronic kidney disease with stage 1 through stage 4 chronic kidney disease, or unspecified chronic kidney disease: Secondary | ICD-10-CM | POA: Diagnosis not present

## 2015-04-21 DIAGNOSIS — E876 Hypokalemia: Secondary | ICD-10-CM | POA: Diagnosis not present

## 2015-04-21 DIAGNOSIS — J9602 Acute respiratory failure with hypercapnia: Secondary | ICD-10-CM | POA: Diagnosis not present

## 2015-04-21 DIAGNOSIS — F329 Major depressive disorder, single episode, unspecified: Secondary | ICD-10-CM | POA: Diagnosis not present

## 2015-04-21 DIAGNOSIS — F39 Unspecified mood [affective] disorder: Secondary | ICD-10-CM | POA: Diagnosis not present

## 2015-04-21 DIAGNOSIS — Z79899 Other long term (current) drug therapy: Secondary | ICD-10-CM | POA: Diagnosis not present

## 2015-04-21 DIAGNOSIS — Z87891 Personal history of nicotine dependence: Secondary | ICD-10-CM | POA: Diagnosis not present

## 2015-04-21 DIAGNOSIS — G8929 Other chronic pain: Secondary | ICD-10-CM | POA: Diagnosis not present

## 2015-04-21 DIAGNOSIS — J189 Pneumonia, unspecified organism: Secondary | ICD-10-CM | POA: Diagnosis not present

## 2015-04-21 DIAGNOSIS — Z8711 Personal history of peptic ulcer disease: Secondary | ICD-10-CM | POA: Diagnosis not present

## 2015-04-21 DIAGNOSIS — Z7982 Long term (current) use of aspirin: Secondary | ICD-10-CM | POA: Diagnosis not present

## 2015-04-21 DIAGNOSIS — T502X5A Adverse effect of carbonic-anhydrase inhibitors, benzothiadiazides and other diuretics, initial encounter: Secondary | ICD-10-CM | POA: Diagnosis not present

## 2015-04-21 DIAGNOSIS — Z79891 Long term (current) use of opiate analgesic: Secondary | ICD-10-CM | POA: Diagnosis not present

## 2015-04-21 DIAGNOSIS — F419 Anxiety disorder, unspecified: Secondary | ICD-10-CM | POA: Diagnosis not present

## 2015-04-21 DIAGNOSIS — A419 Sepsis, unspecified organism: Secondary | ICD-10-CM | POA: Diagnosis not present

## 2015-04-21 DIAGNOSIS — D649 Anemia, unspecified: Secondary | ICD-10-CM | POA: Diagnosis not present

## 2015-04-21 DIAGNOSIS — Z6832 Body mass index (BMI) 32.0-32.9, adult: Secondary | ICD-10-CM | POA: Diagnosis not present

## 2015-04-21 DIAGNOSIS — N179 Acute kidney failure, unspecified: Secondary | ICD-10-CM | POA: Diagnosis not present

## 2015-04-21 DIAGNOSIS — E669 Obesity, unspecified: Secondary | ICD-10-CM | POA: Diagnosis not present

## 2015-04-21 DIAGNOSIS — N181 Chronic kidney disease, stage 1: Secondary | ICD-10-CM | POA: Diagnosis not present

## 2015-04-21 DIAGNOSIS — F0390 Unspecified dementia without behavioral disturbance: Secondary | ICD-10-CM | POA: Diagnosis not present

## 2015-04-21 LAB — BASIC METABOLIC PANEL
ANION GAP: 8 (ref 5–15)
BUN: 33 mg/dL — ABNORMAL HIGH (ref 6–20)
CALCIUM: 9.5 mg/dL (ref 8.9–10.3)
CO2: 37 mmol/L — ABNORMAL HIGH (ref 22–32)
Chloride: 98 mmol/L — ABNORMAL LOW (ref 101–111)
Creatinine, Ser: 1.15 mg/dL — ABNORMAL HIGH (ref 0.44–1.00)
GFR, EST AFRICAN AMERICAN: 50 mL/min — AB (ref >60)
GFR, EST NON AFRICAN AMERICAN: 43 mL/min — AB (ref >60)
Glucose, Bld: 174 mg/dL — ABNORMAL HIGH (ref 65–99)
Potassium: 3.2 mmol/L — ABNORMAL LOW (ref 3.5–5.1)
SODIUM: 143 mmol/L (ref 135–145)

## 2015-04-21 LAB — CBC
HCT: 32 % — ABNORMAL LOW (ref 36.0–46.0)
HEMOGLOBIN: 9.2 g/dL — AB (ref 12.0–15.0)
MCH: 20.4 pg — ABNORMAL LOW (ref 26.0–34.0)
MCHC: 28.8 g/dL — ABNORMAL LOW (ref 30.0–36.0)
MCV: 71 fL — ABNORMAL LOW (ref 78.0–100.0)
PLATELETS: 353 10*3/uL (ref 150–400)
RBC: 4.51 MIL/uL (ref 3.87–5.11)
RDW: 19.7 % — ABNORMAL HIGH (ref 11.5–15.5)
WBC: 11.9 10*3/uL — AB (ref 4.0–10.5)

## 2015-04-21 MED ORDER — LISINOPRIL 20 MG PO TABS
40.0000 mg | ORAL_TABLET | Freq: Every day | ORAL | Status: DC
  Administered 2015-04-22 – 2015-04-24 (×3): 40 mg via ORAL
  Filled 2015-04-21 (×3): qty 2

## 2015-04-21 MED ORDER — HYDRALAZINE HCL 20 MG/ML IJ SOLN
5.0000 mg | INTRAMUSCULAR | Status: DC | PRN
  Administered 2015-04-21: 5 mg via INTRAVENOUS
  Filled 2015-04-21: qty 1

## 2015-04-21 MED ORDER — POTASSIUM CHLORIDE CRYS ER 20 MEQ PO TBCR
40.0000 meq | EXTENDED_RELEASE_TABLET | Freq: Two times a day (BID) | ORAL | Status: AC
  Administered 2015-04-21 (×2): 40 meq via ORAL
  Filled 2015-04-21 (×2): qty 2

## 2015-04-21 NOTE — Evaluation (Signed)
Physical Therapy Evaluation Patient Details Name: Jamie [name] MRN: [id number] DOB: May 07, 1930 Today's Date: 04/21/2015   History of Present Illness  pt was admitted with SOB.  Diagnosed with  acute respiratory failure with hypoxia.  She has a h/o dementia  Clinical Impression  Patient required encouragement for mobility, declined to ambulate but did stand at the edge of the bed x 1 and scoot up in bed. Patient will benefit from PT to address problems listed in note below.    Follow Up Recommendations SNF;Supervision/Assistance - 24 hour    Equipment Recommendations  None recommended by PT    Recommendations for Other Services       Precautions / Restrictions Precautions Precautions: Fall Precaution Comments: monitor sats Restrictions Weight Bearing Restrictions: No      Mobility  Bed Mobility Overal bed mobility: Needs Assistance Bed Mobility: Supine to Sit     Supine to sit: Min assist     General bed mobility comments: guiding assistance to initiate, one hand to pull to sitting, multimodal cues to scoot to the edge.  Transfers Overall transfer level: Needs assistance Equipment used: Rolling walker (2 wheeled) Transfers: Sit to/from Stand Sit to Stand: Min assist;+2 safety/equipment         General transfer comment: light assistance to stand and steady; pulled up on walker,+2 for safety, sat quickly without notice, much encouragement to stand  Ambulation/Gait             General Gait Details: patient declined.  Stairs            Wheelchair Mobility    Modified Rankin (Stroke Patients Only)       Balance Overall balance assessment: Needs assistance Sitting-balance support: Feet supported;Bilateral upper extremity supported Sitting balance-Leahy Scale: Fair     Standing balance support: During functional activity;Bilateral upper extremity supported Standing balance-Leahy Scale: Poor                               Pertinent  Vitals/Pain Pain Assessment: Faces Faces Pain Scale: No hurt    Home Living Family/patient expects to be discharged to:: Assisted living                 Additional Comments: per chart. lives at ALF    Prior Function           Comments: uncertain PLOF     Hand Dominance        Extremity/Trunk Assessment   Upper Extremity Assessment: Defer to OT evaluation           Lower Extremity Assessment: Generalized weakness         Communication   Communication:  (very little communication; nodded and shook head)  Cognition Arousal/Alertness: Awake/alert Behavior During Therapy: Flat affect Overall Cognitive Status: Impaired/Different from baseline (ptient did state that she lives in [place] and lives in [place]) Area of Impairment: Orientation Orientation Level: Place;Time;Situation                  General Comments      Exercises        Assessment/Plan    PT Assessment Patient needs continued PT services  PT Diagnosis Difficulty walking;Generalized weakness;Altered mental status   PT Problem List Decreased strength;Decreased activity tolerance;Decreased balance;Decreased mobility;Decreased knowledge of use of DME;Decreased safety awareness;Decreased knowledge of precautions;Cardiopulmonary status limiting activity  PT Treatment Interventions DME instruction;Gait training;Functional mobility training;Therapeutic activities;Therapeutic exercise;Patient/family education   PT Goals (Current  goals can be found in the Care Plan section) Acute Rehab PT Goals Patient Stated Goal: none stated PT Goal Formulation: Patient unable to participate in goal setting Time For Goal Achievement: 05/05/15 Potential to Achieve Goals: Good    Frequency Min 2X/week   Barriers to discharge Decreased caregiver support      Co-evaluation PT/OT/SLP Co-Evaluation/Treatment: Yes Reason for Co-Treatment: Complexity of the patient's impairments (multi-system  involvement);For patient/therapist safety;Necessary to address cognition/behavior during functional activity PT goals addressed during session: Mobility/safety with mobility OT goals addressed during session: ADL's and self-care       End of Session Equipment Utilized During Treatment: Gait belt Activity Tolerance: Patient tolerated treatment well Patient left: in bed;with call bell/phone within reach;with bed alarm set;with restraints reapplied Nurse Communication: Mobility status         Time: 8786-7672 PT Time Calculation (min) (ACUTE ONLY): 17 min   Charges:   PT Evaluation $Initial PT Evaluation Tier I: 1 Procedure     PT G CodesClaretha Cooper 04/21/2015, 1:20 PM [name] PT [phone]

## 2015-04-21 NOTE — Progress Notes (Signed)
Pt BP 177/89, HR 74. MD notified. Will continue to monitor.

## 2015-04-21 NOTE — Evaluation (Signed)
Occupational Therapy Evaluation Patient Details Name: Jamie [name] MRN: [id number] DOB: 1930-01-25 Today's Date: 04/21/2015    History of Present Illness pt was admitted with SOB.  Diagnosed with  acute respiratory failure with hypoxia.  She has a h/o dementia   Clinical Impression   This 81 year old female was admitted for the above. Per chart, she participated in ADLs at ALF, but unsure how much assistance she needed.  Will follow in acute with a trial of OT with min A level goals for UB adls and toilet transfers.    Follow Up Recommendations  SNF (vs ALF, if they can provide needed assistance)    Equipment Recommendations  3 in 1 bedside comode    Recommendations for Other Services       Precautions / Restrictions Precautions Precautions: Fall Restrictions Weight Bearing Restrictions: No      Mobility Bed Mobility Overal bed mobility: Needs Assistance Bed Mobility: Supine to Sit     Supine to sit: Min assist     General bed mobility comments: guiding assistance to initiate  Transfers Overall transfer level: Needs assistance Equipment used: Rolling walker (2 wheeled) Transfers: Sit to/from Stand Sit to Stand: Min assist;+2 safety/equipment         General transfer comment: light assistance to stand and steady; pulled up on walker--+2 for safety, sat quickly without notice    Balance                                            ADL Overall ADL's : Needs assistance/impaired                                       General ADL Comments: pt combed hair with mod A and washed face with set up.  She stood briefly with encouragement but would not take any steps. +2 for safety  Sats remained 92% when off 02 at EOB and 97% on 02     Vision     Perception     Praxis      Pertinent Vitals/Pain Pain Assessment: Faces Faces Pain Scale: No hurt     Hand Dominance     Extremity/Trunk Assessment Upper Extremity  Assessment Upper Extremity Assessment: Difficult to assess due to impaired cognition           Communication Communication Communication:  (very little communication; nodded and shook head)   Cognition Arousal/Alertness: Awake/alert Behavior During Therapy: WFL for tasks assessed/performed Overall Cognitive Status: No family/caregiver present to determine baseline cognitive functioning (h/o dementia; not oriented; delayed processing)  Pt was able to state that she lives in ALF but could not name it.  Did not state her name when asked                     General Comments       Exercises       Shoulder Instructions      Home Living Family/patient expects to be discharged to:: Unsure                                        Prior Functioning/Environment  Comments: per chart, walked with walker and participated in ADLs    OT Diagnosis: Generalized weakness   OT Problem List: Decreased strength;Decreased activity tolerance;Decreased cognition;Decreased safety awareness   OT Treatment/Interventions: Self-care/ADL training;DME and/or AE instruction;Patient/family education;Therapeutic activities    OT Goals(Current goals can be found in the care plan section) Acute Rehab OT Goals Patient Stated Goal: none stated OT Goal Formulation: Patient unable to participate in goal setting Time For Goal Achievement: 04/28/15 Potential to Achieve Goals: Fair ADL Goals Pt Will Transfer to Toilet: with min assist;stand pivot transfer;bedside commode Additional ADL Goal #1: pt will perform UB bathing with min A and mod cues to initiate and continue task Additional ADL Goal #2: pt will tolerate 20 minutes of adl activity with 3 rest breaks  OT Frequency: Min 2X/week   Barriers to D/C:            Co-evaluation PT/OT/SLP Co-Evaluation/Treatment: Yes Reason for Co-Treatment: For patient/therapist safety PT goals addressed during session: Mobility/safety  with mobility OT goals addressed during session: ADL's and self-care      End of Session    Activity Tolerance: Patient limited by fatigue Patient left: in bed;with call bell/phone within reach;with bed alarm set   Time: 0092-3300 OT Time Calculation (min): 17 min Charges:  OT General Charges $OT Visit: 1 Procedure OT Evaluation $Initial OT Evaluation Tier I: 1 Procedure G-Codes:    Vista Sawatzky May 07, 2015, 12:48 PM   Lesle Chris, OTR/L [phone] May 07, 2015

## 2015-04-21 NOTE — Progress Notes (Signed)
[facility] PROGRESS NOTE  Jamie [name] OLM:[id number] DOB: 27-Aug-1930 DOA: 04/17/2015 PCP: Kennyth Arnold, FNP  Assessment/Plan: 1. Acute respiratory failure with hypoxia 1. Suspected HCAP with concerns for possible aspiration and possible bronchiolitis, also pulm edema 2. Pt is continued on empiric vancomycin and zosyn 3. Pt is continued on scheduled nebs and IV steroids 4. 2d echo with reduced EF of 45-50%. Patient is continued on scheduled IV lasix daily 5. O2 sats stable improving and now down to [facility] 6. Patient with normal resp effort today 7. Repeat CXR without significant change 2. Possible acutely decompensated systolic CHF 1. EF 01-00% 2. CXR with findings suggestive of pulm edema 3. Will cont lasix as tolerated, dose decreased from BID to Qday given bump in Cr 4. Continue daily wts and i/o 5. Will check serial trop. Will check TSH 3. CKD, stage 1 1. Cr had improved and remains stable 2. Cont monitor closely 3. As renal function had improved, have resumed lisinopril 4. HTN 1. bp suboptimal 2. Cont monitor 3. Continued on lisinopril 4. Will add PRN hydralazine 5. HLD 1. Continue statin per home regimen 6. Dementia 1. Stable 7. Hypokalemia 1. Likely secondary to diuresis 2. K of 3.2 today 3. Replace 4. Cont to follow lytes and correct as needed 8. Hypomagnesemia 1. Replaced 9. Elevated Troponin 1. Peak trop recently noted to be 0.82, now trending down 2. Case was discussed with Cardiology recently, with recs that elevated trop is likely trop leak from acute illness 3. Cont to monitor trends  Code Status: Full Family Communication: Pt in room Disposition Plan: Pending   Consultants:    Procedures:    Antibiotics:  Vancomycin 8/17>>>  Zosyn 8/17>>>   HPI/Subjective: More awake. Complains of continued mild sob  Objective: Filed Vitals:   04/20/15 2002 04/21/15 0537 04/21/15 0830 04/21/15 1455  BP: 152/70 155/72  177/89  Pulse:    74   Temp: 98.5 F (36.9 C) 98.4 F (36.9 C)  98.6 F (37 C)  TempSrc: Oral Oral  Oral  Resp: 18 19    Height:      Weight:      SpO2: 91% 94% 90% 95%    Intake/Output Summary (Last 24 hours) at 04/21/15 1507 Last data filed at 04/21/15 1456  Gross per 24 hour  Intake    989 ml  Output   3975 ml  Net  -2986 ml   Filed Weights   04/18/15 1410 04/19/15 0007 04/20/15 0508  Weight: 75.161 kg (165 lb 11.2 oz) 75.252 kg (165 lb 14.4 oz) 72.213 kg (159 lb 3.2 oz)    Exam:   General:  Awake, laying in bed, in nad  Cardiovascular: regular, s1, s2  Respiratory: normal resp effort, coarse breath sounds  Abdomen: soft,nondistended, obese  Musculoskeletal: perfused, no clubbing   Data Reviewed: Basic Metabolic Panel:  Recent Labs Lab 04/17/15 1655 04/18/15 0516 04/19/15 0610 04/19/15 0620 04/20/15 0805 04/21/15 0500  NA 139 139 144  --  142 143  K 3.4* 3.8 3.0*  --  3.1* 3.2*  CL 101 106 102  --  97* 98*  CO2 30 26 30   --  36* 37*  GLUCOSE 125* 192* 167*  --  155* 174*  BUN 26* 20 20  --  31* 33*  CREATININE 1.53* 1.03* 0.93  --  1.14* 1.15*  CALCIUM 10.1 9.2 9.7  --  9.6 9.5  MG  --   --   --  1.5* 2.0  --  Liver Function Tests:  Recent Labs Lab 04/17/15 1655 04/18/15 0516  AST 43* 38  ALT 23 22  ALKPHOS 80 73  BILITOT 0.5 0.5  PROT 6.9 6.2*  ALBUMIN 3.4* 3.0*   No results for input(s): LIPASE, AMYLASE in the last 168 hours. No results for input(s): AMMONIA in the last 168 hours. CBC:  Recent Labs Lab 04/17/15 1655 04/18/15 0516 04/19/15 0610 04/20/15 0805 04/21/15 0500  WBC 7.8 8.3 12.8* 12.4* 11.9*  NEUTROABS 5.1 7.7  --   --   --   HGB 9.1* 8.4* 9.3* 9.4* 9.2*  HCT 30.2* 28.8* 31.4* 31.5* 32.0*  MCV 70.7* 70.9* 70.9* 71.1* 71.0*  PLT 250 232 317 381 353   Cardiac Enzymes:  Recent Labs Lab 04/19/15 1941 04/19/15 2358 04/20/15 0805  TROPONINI 0.78* 0.82* 0.65*   BNP (last 3 results)  Recent Labs  04/17/15 1655  BNP 125.7*     ProBNP (last 3 results) No results for input(s): PROBNP in the last 8760 hours.  CBG: No results for input(s): GLUCAP in the last 168 hours.  Recent Results (from the past 240 hour(s))  Blood Culture (routine x 2)     Status: None (Preliminary result)   Collection Time: 04/17/15  4:55 PM  Result Value Ref Range Status   Specimen Description BLOOD RIGHT WRIST  Final   Special Requests BOTTLES DRAWN AEROBIC AND ANAEROBIC 5 CC EA  Final   Culture   Final    NO GROWTH 3 DAYS Performed at [facility]    Report Status PENDING  Incomplete  Blood Culture (routine x 2)     Status: None (Preliminary result)   Collection Time: 04/17/15  4:57 PM  Result Value Ref Range Status   Specimen Description BLOOD LEFT ANTECUBITAL  Final   Special Requests BOTTLES DRAWN AEROBIC AND ANAEROBIC 5 CC EA  Final   Culture   Final    NO GROWTH 3 DAYS Performed at [facility]    Report Status PENDING  Incomplete  Urine culture     Status: None   Collection Time: 04/17/15  6:03 PM  Result Value Ref Range Status   Specimen Description URINE, CATHETERIZED  Final   Special Requests NONE  Final   Culture   Final    NO GROWTH 2 DAYS Performed at [facility]    Report Status 04/19/2015 FINAL  Final     Studies: Dg Chest Port 1 View  04/20/2015   CLINICAL DATA:  Congestive heart failure.  Confusion.  EXAM: PORTABLE CHEST - 1 VIEW  COMPARISON:  One-view chest x-ray 04/18/2015.  FINDINGS: The heart is enlarged. The aorta is prominent. Moderate interstitial edema is again noted. Recurrent right upper lobe airspace disease is evident. Mild left basilar airspace opacities are noted as well.  IMPRESSION: 1. Cardiomegaly and persistent mild interstitial edema. 2. Right upper lobe airspace disease is concerning for pneumonia. 3. Mild left basilar airspace disease.   Electronically Signed   By: San Morelle M.D.   On: 04/20/2015 07:14    Scheduled Meds: . atorvastatin  20 mg Oral  QHS  . calcium-vitamin D  1 tablet Oral Q breakfast  . furosemide  40 mg Intravenous Daily  . guaiFENesin  600 mg Oral BID  . heparin  5,000 Units Subcutaneous 3 times per day  . ipratropium-albuterol  3 mL Nebulization Q6H  . lisinopril  20 mg Oral Daily  . methylPREDNISolone (SOLU-MEDROL) injection  60 mg Intravenous Q6H  .  piperacillin-tazobactam (ZOSYN)  IV  3.375 g Intravenous Q8H  . potassium chloride  40 mEq Oral BID  . sertraline  50 mg Oral Daily  . vancomycin  1,000 mg Intravenous Q24H   Continuous Infusions: . sodium chloride 30 mL (04/18/15 2103)    Principal Problem:   Acute respiratory failure with hypoxia and hypercapnia Active Problems:   Essential hypertension   Dementia   Acute respiratory failure with hypoxia   HCAP (healthcare-associated pneumonia)   Acute-on-chronic kidney injury   Anemia   Sepsis   Hypoxia   AKI (acute kidney injury)   Tachypnea  CHIU, STEPHEN K  [facility] Pager [phone]. If 7PM-7AM, please contact night-coverage at [email or web address], password [facility] 04/21/2015, 3:07 PM  LOS: 4 days

## 2015-04-22 LAB — BASIC METABOLIC PANEL
Anion gap: 9 (ref 5–15)
BUN: 34 mg/dL — ABNORMAL HIGH (ref 6–20)
CHLORIDE: 97 mmol/L — AB (ref 101–111)
CO2: 37 mmol/L — ABNORMAL HIGH (ref 22–32)
Calcium: 9.8 mg/dL (ref 8.9–10.3)
Creatinine, Ser: 1.07 mg/dL — ABNORMAL HIGH (ref 0.44–1.00)
GFR, EST AFRICAN AMERICAN: 54 mL/min — AB (ref >60)
GFR, EST NON AFRICAN AMERICAN: 47 mL/min — AB (ref >60)
Glucose, Bld: 176 mg/dL — ABNORMAL HIGH (ref 65–99)
POTASSIUM: 3.7 mmol/L (ref 3.5–5.1)
SODIUM: 143 mmol/L (ref 135–145)

## 2015-04-22 LAB — URINALYSIS, ROUTINE W REFLEX MICROSCOPIC
BILIRUBIN URINE: NEGATIVE
Glucose, UA: NEGATIVE mg/dL
KETONES UR: NEGATIVE mg/dL
NITRITE: NEGATIVE
PH: 7.5 (ref 5.0–8.0)
Protein, ur: NEGATIVE mg/dL
SPECIFIC GRAVITY, URINE: 1.012 (ref 1.005–1.030)
Urobilinogen, UA: 0.2 mg/dL (ref 0.0–1.0)

## 2015-04-22 LAB — URINE MICROSCOPIC-ADD ON

## 2015-04-22 LAB — CULTURE, BLOOD (ROUTINE X 2)
CULTURE: NO GROWTH
Culture: NO GROWTH

## 2015-04-22 LAB — CBC
HEMATOCRIT: 34 % — AB (ref 36.0–46.0)
Hemoglobin: 9.8 g/dL — ABNORMAL LOW (ref 12.0–15.0)
MCH: 20.5 pg — ABNORMAL LOW (ref 26.0–34.0)
MCHC: 28.8 g/dL — ABNORMAL LOW (ref 30.0–36.0)
MCV: 71 fL — AB (ref 78.0–100.0)
PLATELETS: 388 10*3/uL (ref 150–400)
RBC: 4.79 MIL/uL (ref 3.87–5.11)
RDW: 19.7 % — ABNORMAL HIGH (ref 11.5–15.5)
WBC: 13.2 10*3/uL — AB (ref 4.0–10.5)

## 2015-04-22 MED ORDER — BENZONATATE 100 MG PO CAPS
100.0000 mg | ORAL_CAPSULE | Freq: Three times a day (TID) | ORAL | Status: DC | PRN
  Administered 2015-04-22: 100 mg via ORAL
  Filled 2015-04-22: qty 1

## 2015-04-22 MED ORDER — IPRATROPIUM-ALBUTEROL 0.5-2.5 (3) MG/3ML IN SOLN
3.0000 mL | Freq: Three times a day (TID) | RESPIRATORY_TRACT | Status: DC
  Administered 2015-04-22 – 2015-04-24 (×5): 3 mL via RESPIRATORY_TRACT
  Filled 2015-04-22 (×8): qty 3

## 2015-04-22 MED ORDER — METHYLPREDNISOLONE SODIUM SUCC 125 MG IJ SOLR
60.0000 mg | Freq: Two times a day (BID) | INTRAMUSCULAR | Status: DC
  Administered 2015-04-22 – 2015-04-23 (×2): 60 mg via INTRAVENOUS
  Filled 2015-04-22 (×2): qty 2

## 2015-04-22 MED ORDER — OXYMETAZOLINE HCL 0.05 % NA SOLN
2.0000 | Freq: Two times a day (BID) | NASAL | Status: DC | PRN
  Administered 2015-04-22: 2 via NASAL
  Filled 2015-04-22: qty 15

## 2015-04-22 MED ORDER — IPRATROPIUM-ALBUTEROL 0.5-2.5 (3) MG/3ML IN SOLN: 3.0000 mL | Freq: Four times a day (QID) | RESPIRATORY_TRACT | Status: DC | PRN

## 2015-04-22 NOTE — Progress Notes (Signed)
[facility] PROGRESS NOTE  Jamie [name] GBT:[id number] DOB: 22-Aug-1930 DOA: 04/17/2015 PCP: Kennyth Arnold, FNP  Assessment/Plan: 1. Acute respiratory failure with hypoxia 1. Suspected HCAP with concerns for possible aspiration and possible bronchiolitis, also pulm edema 2. Pt is continued on empiric vancomycin and zosyn 3. Pt is continued on scheduled nebs  4. As pt is on min O2 support, will decrease steroids fro Q6hrs to Q12 hrs 5. 2d echo with reduced EF of 45-50%. Patient is continued on scheduled IV lasix daily 6. O2 sats stable improving and now down to [facility] 7. Patient with normal resp effort today 8. Repeat CXR without significant change 2. Possible acutely decompensated systolic CHF 1. EF 71-06% 2. CXR with findings suggestive of pulm edema 3. Pt is cont lasix, dose was decreased from BID to Qday given slight bump in Cr 4. Continue daily wts and i/o 3. CKD, stage 1 1. Cr had improved and remains stable with diuresis 2. Cont monitor closely 3. As renal function had improved, had resumed lisinopril 4. HTN 1. bp stable and currently controlled 2. Cont monitor 3. Continued on lisinopril 4. Continue PRN hydralazine 5. HLD 1. Continue statin per home regimen 6. Dementia 1. Stable 7. Hypokalemia 1. Likely secondary to diuresis 2. Replaced 3. Cont to follow lytes and correct as needed 8. Hypomagnesemia 1. Replaced and normalized 9. Elevated Troponin 1. Peak trop recently noted to be 0.82, now trending down 2. Case was discussed with Cardiology recently, with recs that elevated trop is likely trop leak from acute illness Code Status: Full Family Communication: Pt in room Disposition Plan: Pending   Consultants:    Procedures:    Antibiotics:  Vancomycin 8/17>>>  Zosyn 8/17>>>   HPI/Subjective: Awake. Mildly confused. States feeling generally "bad" but denies sob  Objective: Filed Vitals:   04/21/15 1958 04/21/15 2103 04/22/15 0637 04/22/15 1524   BP:  124/69 128/56 123/63  Pulse: 83 89 86 86  Temp:  98.7 F (37.1 C) 98.4 F (36.9 C) 98.1 F (36.7 C)  TempSrc:  Oral Oral Oral  Resp: 18 18 20 20   Height:      Weight:      SpO2: 95% 96% 99% 95%    Intake/Output Summary (Last 24 hours) at 04/22/15 1658 Last data filed at 04/22/15 1523  Gross per 24 hour  Intake    820 ml  Output   3525 ml  Net  -2705 ml   Filed Weights   04/18/15 1410 04/19/15 0007 04/20/15 0508  Weight: 75.161 kg (165 lb 11.2 oz) 75.252 kg (165 lb 14.4 oz) 72.213 kg (159 lb 3.2 oz)    Exam:   General:  Awake, laying in bed, in nad  Cardiovascular: regular, s1, s2  Respiratory: normal resp effort, coarse breath sounds  Abdomen: soft,nondistended, obese  Musculoskeletal: perfused, no clubbing, no cyanosis  Data Reviewed: Basic Metabolic Panel:  Recent Labs Lab 04/18/15 0516 04/19/15 0610 04/19/15 0620 04/20/15 0805 04/21/15 0500 04/22/15 0536  NA 139 144  --  142 143 143  K 3.8 3.0*  --  3.1* 3.2* 3.7  CL 106 102  --  97* 98* 97*  CO2 26 30  --  36* 37* 37*  GLUCOSE 192* 167*  --  155* 174* 176*  BUN 20 20  --  31* 33* 34*  CREATININE 1.03* 0.93  --  1.14* 1.15* 1.07*  CALCIUM 9.2 9.7  --  9.6 9.5 9.8  MG  --   --  1.5*  2.0  --   --    Liver Function Tests:  Recent Labs Lab 04/17/15 1655 04/18/15 0516  AST 43* 38  ALT 23 22  ALKPHOS 80 73  BILITOT 0.5 0.5  PROT 6.9 6.2*  ALBUMIN 3.4* 3.0*   No results for input(s): LIPASE, AMYLASE in the last 168 hours. No results for input(s): AMMONIA in the last 168 hours. CBC:  Recent Labs Lab 04/17/15 1655 04/18/15 0516 04/19/15 0610 04/20/15 0805 04/21/15 0500 04/22/15 0536  WBC 7.8 8.3 12.8* 12.4* 11.9* 13.2*  NEUTROABS 5.1 7.7  --   --   --   --   HGB 9.1* 8.4* 9.3* 9.4* 9.2* 9.8*  HCT 30.2* 28.8* 31.4* 31.5* 32.0* 34.0*  MCV 70.7* 70.9* 70.9* 71.1* 71.0* 71.0*  PLT 250 232 317 381 353 388   Cardiac Enzymes:  Recent Labs Lab 04/19/15 1941 04/19/15 2358  04/20/15 0805  TROPONINI 0.78* 0.82* 0.65*   BNP (last 3 results)  Recent Labs  04/17/15 1655  BNP 125.7*    ProBNP (last 3 results) No results for input(s): PROBNP in the last 8760 hours.  CBG: No results for input(s): GLUCAP in the last 168 hours.  Recent Results (from the past 240 hour(s))  Blood Culture (routine x 2)     Status: None (Preliminary result)   Collection Time: 04/17/15  4:55 PM  Result Value Ref Range Status   Specimen Description BLOOD RIGHT WRIST  Final   Special Requests BOTTLES DRAWN AEROBIC AND ANAEROBIC 5 CC EA  Final   Culture   Final    NO GROWTH 4 DAYS Performed at [facility]    Report Status PENDING  Incomplete  Blood Culture (routine x 2)     Status: None (Preliminary result)   Collection Time: 04/17/15  4:57 PM  Result Value Ref Range Status   Specimen Description BLOOD LEFT ANTECUBITAL  Final   Special Requests BOTTLES DRAWN AEROBIC AND ANAEROBIC 5 CC EA  Final   Culture   Final    NO GROWTH 4 DAYS Performed at [facility]    Report Status PENDING  Incomplete  Urine culture     Status: None   Collection Time: 04/17/15  6:03 PM  Result Value Ref Range Status   Specimen Description URINE, CATHETERIZED  Final   Special Requests NONE  Final   Culture   Final    NO GROWTH 2 DAYS Performed at [facility]    Report Status 04/19/2015 FINAL  Final     Studies: No results found.  Scheduled Meds: . atorvastatin  20 mg Oral QHS  . calcium-vitamin D  1 tablet Oral Q breakfast  . furosemide  40 mg Intravenous Daily  . guaiFENesin  600 mg Oral BID  . heparin  5,000 Units Subcutaneous 3 times per day  . ipratropium-albuterol  3 mL Nebulization TID  . lisinopril  40 mg Oral Daily  . methylPREDNISolone (SOLU-MEDROL) injection  60 mg Intravenous Q12H  . piperacillin-tazobactam (ZOSYN)  IV  3.375 g Intravenous Q8H  . sertraline  50 mg Oral Daily  . vancomycin  1,000 mg Intravenous Q24H   Continuous Infusions: .  sodium chloride 30 mL/hr at 04/21/15 1851    Principal Problem:   Acute respiratory failure with hypoxia and hypercapnia Active Problems:   Essential hypertension   Dementia   Acute respiratory failure with hypoxia   HCAP (healthcare-associated pneumonia)   Acute-on-chronic kidney injury   Anemia   Sepsis  Hypoxia   AKI (acute kidney injury)   Tachypnea  CHIU, STEPHEN K  [facility] Pager [phone]. If 7PM-7AM, please contact night-coverage at [email or web address], password [facility] 04/22/2015, 4:58 PM  LOS: 5 days

## 2015-04-23 LAB — BASIC METABOLIC PANEL
ANION GAP: 6 (ref 5–15)
BUN: 35 mg/dL — ABNORMAL HIGH (ref 6–20)
CHLORIDE: 94 mmol/L — AB (ref 101–111)
CO2: 40 mmol/L — AB (ref 22–32)
CREATININE: 1.11 mg/dL — AB (ref 0.44–1.00)
Calcium: 9.4 mg/dL (ref 8.9–10.3)
GFR calc non Af Amer: 45 mL/min — ABNORMAL LOW (ref >60)
GFR, EST AFRICAN AMERICAN: 52 mL/min — AB (ref >60)
Glucose, Bld: 165 mg/dL — ABNORMAL HIGH (ref 65–99)
POTASSIUM: 3.3 mmol/L — AB (ref 3.5–5.1)
SODIUM: 140 mmol/L (ref 135–145)

## 2015-04-23 LAB — CBC
HEMATOCRIT: 35.7 % — AB (ref 36.0–46.0)
HEMOGLOBIN: 10.6 g/dL — AB (ref 12.0–15.0)
MCH: 21.3 pg — AB (ref 26.0–34.0)
MCHC: 29.7 g/dL — ABNORMAL LOW (ref 30.0–36.0)
MCV: 71.7 fL — AB (ref 78.0–100.0)
PLATELETS: 419 10*3/uL — AB (ref 150–400)
RBC: 4.98 MIL/uL (ref 3.87–5.11)
RDW: 19.9 % — ABNORMAL HIGH (ref 11.5–15.5)
WBC: 14.1 10*3/uL — AB (ref 4.0–10.5)

## 2015-04-23 MED ORDER — ENSURE ENLIVE PO LIQD
237.0000 mL | Freq: Two times a day (BID) | ORAL | Status: DC
  Administered 2015-04-23 – 2015-04-24 (×3): 237 mL via ORAL

## 2015-04-23 MED ORDER — METHYLPREDNISOLONE SODIUM SUCC 125 MG IJ SOLR
60.0000 mg | INTRAMUSCULAR | Status: DC
  Administered 2015-04-24: 60 mg via INTRAVENOUS
  Filled 2015-04-23: qty 2

## 2015-04-23 NOTE — Progress Notes (Signed)
ANTIBIOTIC CONSULT NOTE - FOLLOW UP  Pharmacy Consult for vancomycin and zosyn Indication: rule out pneumonia  Allergies  Allergen Reactions  . Pollen Extract Other (See Comments)    Swollen Eyes & Runny Nose.    Patient Measurements: Height: 5' (152.4 cm) Weight: 155 lb (70.308 kg) IBW/kg (Calculated) : 45.5  Vital Signs: Temp: 98 F (36.7 C) (08/22 0949) Temp Source: Oral (08/22 0949) BP: 120/60 mmHg (08/22 0949) Pulse Rate: 74 (08/22 0949) Intake/Output from previous day: 08/21 0701 - 08/22 0700 In: 1940 [P.O.:480; I.V.:1110; IV Piggyback:350] Out: 2225 [Urine:2225] Intake/Output from this shift: Total I/O In: 120 [P.O.:120] Out: -   Labs:  Recent Labs  04/21/15 0500 04/22/15 0536 04/23/15 0815  WBC 11.9* 13.2* 14.1*  HGB 9.2* 9.8* 10.6*  PLT 353 388 419*  CREATININE 1.15* 1.07* 1.11*   Estimated Creatinine Clearance: 33.6 mL/min (by C-G formula based on Cr of 1.11).  Recent Labs  04/20/15 1715  [place]     Microbiology: Recent Results (from the past 720 hour(s))  Blood Culture (routine x 2)     Status: None   Collection Time: 04/17/15  4:55 PM  Result Value Ref Range Status   Specimen Description BLOOD RIGHT WRIST  Final   Special Requests BOTTLES DRAWN AEROBIC AND ANAEROBIC 5 CC EA  Final   Culture   Final    NO GROWTH 5 DAYS Performed at [facility]    Report Status 04/22/2015 FINAL  Final  Blood Culture (routine x 2)     Status: None   Collection Time: 04/17/15  4:57 PM  Result Value Ref Range Status   Specimen Description BLOOD LEFT ANTECUBITAL  Final   Special Requests BOTTLES DRAWN AEROBIC AND ANAEROBIC 5 CC EA  Final   Culture   Final    NO GROWTH 5 DAYS Performed at [facility]    Report Status 04/22/2015 FINAL  Final  Urine culture     Status: None   Collection Time: 04/17/15  6:03 PM  Result Value Ref Range Status   Specimen Description URINE, CATHETERIZED  Final   Special Requests NONE  Final   Culture    Final    NO GROWTH 2 DAYS Performed at [facility]    Report Status 04/19/2015 FINAL  Final    Anti-infectives    Start     Dose/Rate Route Frequency Ordered Stop   04/18/15 1800  vancomycin (VANCOCIN) IVPB 750 mg/150 ml premix  Status:  Discontinued     750 mg 150 mL/hr over 60 Minutes Intravenous Every 24 hours 04/17/15 1819 04/18/15 1529   04/18/15 1800  vancomycin (VANCOCIN) IVPB 1000 mg/200 mL premix     1,000 mg 200 mL/hr over 60 Minutes Intravenous Every 24 hours 04/18/15 1529     04/18/15 0200  piperacillin-tazobactam (ZOSYN) IVPB 3.375 g     3.375 g 12.5 mL/hr over 240 Minutes Intravenous Every 8 hours 04/17/15 1819     04/17/15 1700  vancomycin (VANCOCIN) 1,250 mg in sodium chloride 0.9 % 250 mL IVPB     1,250 mg 166.7 mL/hr over 90 Minutes Intravenous STAT 04/17/15 1637 04/18/15 0030   04/17/15 1645  piperacillin-tazobactam (ZOSYN) IVPB 3.375 g  Status:  Discontinued     3.375 g 100 mL/hr over 30 Minutes Intravenous 3 times per day 04/17/15 1637 04/17/15 1818      Assessment: Patient's an 81 y.o F currently on vancomycin and zosyn day #6 for suspected PNA.  Today, 04/23/2015: -  8/17 CXR shows persistent RUL opacity.  - 8/19 CXR: RUL airspace dz concerning for PNA - afeb, wbc trending up 14.1, scr stable 1.11 (crcl~33)  8/16 >> vanc >> 8/16 >> zosyn >>  8/16 blood x 2: neg FINAL 8/16 urine: NGF S. pneumo UAg: neg Legionella UAg: neg Flu panel neg  Dose changes/levels: - 8/19 trough @ 1700 = 13 on 1g q24 (after 2 doses) - cont same dose as anticipate accumulation with poor renal function   Goal of Therapy:  Vancomycin trough level 15-20 mcg/ml  Plan:  - continue vancomycin 1gm IV q24h - continue zosyn 3.375 gm IV q8h (infuse over 4 hours) - please indicate plan for IV abx.  If to continue, pharmacy will plan on re-checking vancomycin level in the next 24-48 hrs  Konstantinos Cordoba P 04/23/2015,9:53 AM

## 2015-04-23 NOTE — Progress Notes (Signed)
Speech Language Pathology Treatment: Dysphagia  Patient Details Name: Jamie [name] MRN: [id number] DOB: 1930-04-07 Today's Date: 04/23/2015 Time: 4076-8088 SLP Time Calculation (min) (ACUTE ONLY): 10 min  Assessment / Plan / Recommendation Clinical Impression  Pt tolerating po well today based on clinical observation.  RN reports good tolerance of medication with applesauce and reports pt tends to eat entire applesauce container contents after pill consumption.  Poor intake otherwise reported by RN.  Pt today with good ability to self feed applesauce, crackers and water.  Adequate mastication and timely swallow noted.  No s/s of aspiration with all po observed despite pt's rapid rate of intake.  RN reports recently have given pt Haldol- suspect her anxiety contributed to her rapid rate of eating.   Suspect swallow ability is near baseline however recommend continue mechanical soft diet to compensate for pt's behavior/respiratory issue. SLP to sign off at this time, please reorder if indicated.  RN educated to plan.    HPI Other Pertinent Information: 81 yo female adm to [facility] with respiratory issues - increased shortness of breath - requiring Ventimask support - now on nasal cannula.  Pt had episode over night of tachycardia and increased respiratory issues - s/p lasix.  PMH + for obesity, gastric ulcer, HTN, HLD, prior smoker.  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  Swallow eval ordered.    Pertinent Vitals Pain Assessment: Faces Pain Score: 2  Pain Location: pt did not verbalize pain location and not appear to be in pain- articulated "help me"  over and over, educated her to catheter in place when she reported need to urinate  SLP Plan  All goals met    Recommendations Diet recommendations: Dysphagia 3 (mechanical soft);Thin liquid Liquids provided via: Cup;Straw Medication Administration: Whole meds with puree Supervision: Patient able to self feed;Intermittent  supervision to cue for compensatory strategies Compensations: Slow rate;Small sips/bites Postural Changes and/or Swallow Maneuvers: Seated upright 90 degrees;Upright 30-60 min after meal              Oral Care Recommendations: Oral care BID Plan: All goals met    [place], [place] [facility] SLP [phone]

## 2015-04-23 NOTE — Progress Notes (Signed)
Initial Nutrition Assessment  DOCUMENTATION CODES:   Obesity unspecified  INTERVENTION:  - Will order Ensure Enlive BID, each supplement provides 350 kcal and 20 grams of protein - RD will continue to monitor for needs  NUTRITION DIAGNOSIS:   Inadequate oral intake related to lethargy/confusion as evidenced by meal completion < 50%.  GOAL:   Patient will meet greater than or equal to 90% of their needs  MONITOR:   PO intake, Supplement acceptance, Weight trends, Labs, I & O's  REASON FOR ASSESSMENT:   Malnutrition Screening Tool  ASSESSMENT:   81 y.o. female with Past medical history of dementia, hypertension, mood disorder.  Pt seen for MST. BMI indicates obesity. Pt with dementia and was very confused during discussion stating that she was unable to understand RD and stating she needed help. When RD asked what pt needed help with she stated, "my personality."   Lunch tray in front of pt untouched. Per chart review, she has been eating 25% on average since admission. Will trial Ensure Enlive. If pt does not accept this supplement will try other supplements.  No family or visitors present to provide PTA information. Per weight hx review, pt has lost 8 lbs (5% body weight) in the past 6 months followed by weight gain which then  indicates 24 lb weight loss (13% body weight) in 4 months. No muscle or fat wasting noted.  Medications reviewed. Labs reviewed; K: 3.3 mmol/L, Cl: 94 mmol/L, BUN/creatinine elevated and continues to trend up, GFR: 45.   Diet Order:  DIET DYS 3 Room service appropriate?: No; Fluid consistency:: Thin  Skin:  Wound (see comment) (L breast wound)  Last BM:  8/18  Height:   Ht Readings from Last 1 Encounters:  04/19/15 5' (1.524 m)    Weight:   Wt Readings from Last 1 Encounters:  04/22/15 155 lb (70.308 kg)    Ideal Body Weight:  45.45 kg (kg)  BMI:  Body mass index is 30.27 kg/(m^2).  Estimated Nutritional Needs:   Kcal:   5638-9373  Protein:  55-65 grams  Fluid:  2.2 L/day  EDUCATION NEEDS:   No education needs identified at this time     Jarome Matin, RD, LDN Inpatient Clinical Dietitian Pager # [phone] After hours/weekend pager # [phone]

## 2015-04-23 NOTE — Progress Notes (Signed)
CSW continuing to follow.   Pt admitted from [facility], but PT recommending that pt needing SNF for rehab upon discharge.   CSW spoke with pt guardian, [name] this morning who confirmed agreement for SNF search in [facility]. Per legal guardian, pt will need a locked memory care unit at [facility]. Pt guardian hopeful for [facility] in [place].   CSW completed FL2 and initiated SNF search to [facility]. CSW contacted [facility] and notified facility of legal guardian through [place] interest in the facility's locked memory care unit.  CSW received return phone call from [facility] and facility confirmed that facility can offer pt a bed in the locked memory care unit at [facility].   Pt legal guardian, [name] was at bedside this afternoon and CSW updated pt legal guardian about bed offer from [facility] memory care unit and pt legal guardian would like to accept bed offer.   CSW notified [facility] of acceptance of bed offer.  Per MD, pt not yet medically ready for discharge today.   CSW to continue to follow to provide support and assist with pt discharge to [facility] memory care unit when pt medically ready for discharge.  [name], MSW, [place] Work [phone]

## 2015-04-23 NOTE — Progress Notes (Signed)
[facility] PROGRESS NOTE  Jamie [name] TDD:[id number] DOB: 07/18/1930 DOA: 04/17/2015 PCP: Kennyth Arnold, FNP  Assessment/Plan: 1. Acute respiratory failure with hypoxia 1. Suspected HCAP with concerns for possible aspiration and possible bronchiolitis, also pulm edema 2. Pt is continued on empiric vancomycin and zosyn 3. Pt is continued on scheduled nebs  4. As pt is on min O2 support, will decrease steroids fro Q12hrs to Q24 hrs 5. 2d echo with reduced EF of 45-50%. Patient is continued on scheduled IV lasix daily 6. O2 sats stable improving and now down to [facility] 7. Will repeat cxr in AM for interval change 8. Cr slightly rising, will repeat BMET in AM. Plan to transition to PO lasix soon 9. Will start trial of chest PT 2. Possible acutely decompensated systolic CHF 1. EF 62-37% 2. CXR with findings suggestive of pulm edema 3. Pt is cont lasix, dose was decreased from BID to Qday given slight bump in Cr 4. Continue daily wts and i/o 3. CKD, stage 1 1. Cr had improved and remains stable with diuresis 2. Cont monitor closely 3. As renal function had improved, had resumed lisinopril 4. HTN 1. bp stable and currently controlled 2. Cont monitor 3. Continued on lisinopril 4. Continue PRN hydralazine 5. HLD 1. Continue statin per home regimen 6. Dementia 1. Stable 7. Hypokalemia 1. Likely secondary to diuresis 2. Replaced 3. Cont to follow lytes and correct as needed 8. Hypomagnesemia 1. Replaced and normalized 9. Elevated Troponin 1. Peak trop recently noted to be 0.82, now trending down 2. Case was discussed with Cardiology recently, with recs that elevated trop is likely trop leak from acute illness 3. Stable thus far Code Status: Full Family Communication: Pt in room Disposition Plan: Pending   Consultants:    Procedures:    Antibiotics:  Vancomycin 8/17>>>  Zosyn 8/17>>>   HPI/Subjective: Confused but does complain of chest  congestion  Objective: Filed Vitals:   04/23/15 0842 04/23/15 0949 04/23/15 1314 04/23/15 1423  BP:  120/60  105/64  Pulse:  74  97  Temp:  98 F (36.7 C)  98.4 F (36.9 C)  TempSrc:  Oral  Oral  Resp:  18  18  Height:      Weight:      SpO2: 99% 95% 99% 98%    Intake/Output Summary (Last 24 hours) at 04/23/15 1528 Last data filed at 04/23/15 1428  Gross per 24 hour  Intake   1630 ml  Output   1975 ml  Net   -345 ml   Filed Weights   04/19/15 0007 04/20/15 0508 04/22/15 1731  Weight: 75.252 kg (165 lb 14.4 oz) 72.213 kg (159 lb 3.2 oz) 70.308 kg (155 lb)    Exam:   General:  Awake, laying in bed, in nad  Cardiovascular: regular, s1, s2  Respiratory: normal resp effort, coarse breath sounds, end-expiratory wheezing  Abdomen: soft,nondistended, obese  Musculoskeletal: perfused, no clubbing, no cyanosis  Data Reviewed: Basic Metabolic Panel:  Recent Labs Lab 04/19/15 0610 04/19/15 0620 04/20/15 0805 04/21/15 0500 04/22/15 0536 04/23/15 0815  NA 144  --  142 143 143 140  K 3.0*  --  3.1* 3.2* 3.7 3.3*  CL 102  --  97* 98* 97* 94*  CO2 30  --  36* 37* 37* 40*  GLUCOSE 167*  --  155* 174* 176* 165*  BUN 20  --  31* 33* 34* 35*  CREATININE 0.93  --  1.14* 1.15* 1.07* 1.11*  CALCIUM  9.7  --  9.6 9.5 9.8 9.4  MG  --  1.5* 2.0  --   --   --    Liver Function Tests:  Recent Labs Lab 04/17/15 1655 04/18/15 0516  AST 43* 38  ALT 23 22  ALKPHOS 80 73  BILITOT 0.5 0.5  PROT 6.9 6.2*  ALBUMIN 3.4* 3.0*   No results for input(s): LIPASE, AMYLASE in the last 168 hours. No results for input(s): AMMONIA in the last 168 hours. CBC:  Recent Labs Lab 04/17/15 1655 04/18/15 0516 04/19/15 0610 04/20/15 0805 04/21/15 0500 04/22/15 0536 04/23/15 0815  WBC 7.8 8.3 12.8* 12.4* 11.9* 13.2* 14.1*  NEUTROABS 5.1 7.7  --   --   --   --   --   HGB 9.1* 8.4* 9.3* 9.4* 9.2* 9.8* 10.6*  HCT 30.2* 28.8* 31.4* 31.5* 32.0* 34.0* 35.7*  MCV 70.7* 70.9* 70.9* 71.1*  71.0* 71.0* 71.7*  PLT 250 232 317 381 353 388 419*   Cardiac Enzymes:  Recent Labs Lab 04/19/15 1941 04/19/15 2358 04/20/15 0805  TROPONINI 0.78* 0.82* 0.65*   BNP (last 3 results)  Recent Labs  04/17/15 1655  BNP 125.7*    ProBNP (last 3 results) No results for input(s): PROBNP in the last 8760 hours.  CBG: No results for input(s): GLUCAP in the last 168 hours.  Recent Results (from the past 240 hour(s))  Blood Culture (routine x 2)     Status: None   Collection Time: 04/17/15  4:55 PM  Result Value Ref Range Status   Specimen Description BLOOD RIGHT WRIST  Final   Special Requests BOTTLES DRAWN AEROBIC AND ANAEROBIC 5 CC EA  Final   Culture   Final    NO GROWTH 5 DAYS Performed at [facility]    Report Status 04/22/2015 FINAL  Final  Blood Culture (routine x 2)     Status: None   Collection Time: 04/17/15  4:57 PM  Result Value Ref Range Status   Specimen Description BLOOD LEFT ANTECUBITAL  Final   Special Requests BOTTLES DRAWN AEROBIC AND ANAEROBIC 5 CC EA  Final   Culture   Final    NO GROWTH 5 DAYS Performed at [facility]    Report Status 04/22/2015 FINAL  Final  Urine culture     Status: None   Collection Time: 04/17/15  6:03 PM  Result Value Ref Range Status   Specimen Description URINE, CATHETERIZED  Final   Special Requests NONE  Final   Culture   Final    NO GROWTH 2 DAYS Performed at [facility]    Report Status 04/19/2015 FINAL  Final     Studies: No results found.  Scheduled Meds: . atorvastatin  20 mg Oral QHS  . calcium-vitamin D  1 tablet Oral Q breakfast  . feeding supplement (ENSURE ENLIVE)  237 mL Oral BID BM  . furosemide  40 mg Intravenous Daily  . guaiFENesin  600 mg Oral BID  . heparin  5,000 Units Subcutaneous 3 times per day  . ipratropium-albuterol  3 mL Nebulization TID  . lisinopril  40 mg Oral Daily  . [START ON 04/24/2015] methylPREDNISolone (SOLU-MEDROL) injection  60 mg Intravenous Q24H   . piperacillin-tazobactam (ZOSYN)  IV  3.375 g Intravenous Q8H  . sertraline  50 mg Oral Daily  . vancomycin  1,000 mg Intravenous Q24H   Continuous Infusions: . sodium chloride 30 mL/hr at 04/23/15 0941    Principal Problem:   Acute  respiratory failure with hypoxia and hypercapnia Active Problems:   Essential hypertension   Dementia   Acute respiratory failure with hypoxia   HCAP (healthcare-associated pneumonia)   Acute-on-chronic kidney injury   Anemia   Sepsis   Hypoxia   AKI (acute kidney injury)   Tachypnea  Ninetta Adelstein K  [facility] Pager [phone]. If 7PM-7AM, please contact night-coverage at [email or web address], password [facility] 04/23/2015, 3:28 PM  LOS: 6 days

## 2015-04-23 NOTE — Clinical Social Work Placement (Signed)
   CLINICAL SOCIAL WORK PLACEMENT  NOTE  Date:  04/23/2015  Patient Details  Name: Jamie [name] MRN: [id number] Date of Birth: [birth year]  Clinical Social Work is seeking post-discharge placement for this patient at the [place] level of care (*CSW will initial, date and re-position this form in  chart as items are completed):  Yes   Patient/family provided with [place] Work Department's list of facilities offering this level of care within the geographic area requested by the patient (or if unable, by the patient's family).  Yes   Patient/family informed of their freedom to choose among providers that offer the needed level of care, that participate in Medicare, Medicaid or managed care program needed by the patient, have an available bed and are willing to accept the patient.  Yes   Patient/family informed of [place]'s ownership interest in [facility] and [facility], as well as of the fact that they are under no obligation to receive care at these facilities.  PASRR submitted to EDS on 04/23/15     PASRR number received on 04/23/15     Existing PASRR number confirmed on       FL2 transmitted to all facilities in geographic area requested by pt/family on 04/23/15     FL2 transmitted to all facilities within larger geographic area on       Patient informed that his/her managed care company has contracts with or will negotiate with certain facilities, including the following:        Yes   Patient/family informed of bed offers received.  Patient chooses bed at [facility]     Physician recommends and patient chooses bed at      Patient to be transferred to   on  .  Patient to be transferred to facility by       Patient family notified on   of transfer.  Name of family member notified:        PHYSICIAN Please sign FL2     Additional Comment:    _______________________________________________ Ladell Pier,  LCSW 04/23/2015, 4:02 PM

## 2015-04-23 NOTE — Progress Notes (Signed)
Patient found by Nurse Tech with face full of blood.  Patient having moderate nose bleed mainly from Lt nare.  Also coughing up blood and has pink urine in foley bag. Notified NP on call, new orders for saline nasal spray received and NP said to place ice on nose.  Nose bleed continued for about 15-20 minutes.  Large thick clot removed from nare.  Patient denied any pain.  Will continue to monitor patient.

## 2015-04-24 ENCOUNTER — Inpatient Hospital Stay (HOSPITAL_COMMUNITY): Payer: Medicare Other

## 2015-04-24 DIAGNOSIS — M6281 Muscle weakness (generalized): Secondary | ICD-10-CM | POA: Diagnosis not present

## 2015-04-24 DIAGNOSIS — I10 Essential (primary) hypertension: Secondary | ICD-10-CM | POA: Diagnosis not present

## 2015-04-24 DIAGNOSIS — J80 Acute respiratory distress syndrome: Secondary | ICD-10-CM | POA: Diagnosis not present

## 2015-04-24 DIAGNOSIS — F0391 Unspecified dementia with behavioral disturbance: Secondary | ICD-10-CM | POA: Diagnosis not present

## 2015-04-24 DIAGNOSIS — G4700 Insomnia, unspecified: Secondary | ICD-10-CM | POA: Diagnosis not present

## 2015-04-24 DIAGNOSIS — I5022 Chronic systolic (congestive) heart failure: Secondary | ICD-10-CM | POA: Diagnosis not present

## 2015-04-24 DIAGNOSIS — N189 Chronic kidney disease, unspecified: Secondary | ICD-10-CM | POA: Diagnosis not present

## 2015-04-24 DIAGNOSIS — G894 Chronic pain syndrome: Secondary | ICD-10-CM | POA: Diagnosis not present

## 2015-04-24 DIAGNOSIS — N179 Acute kidney failure, unspecified: Secondary | ICD-10-CM | POA: Diagnosis not present

## 2015-04-24 DIAGNOSIS — J189 Pneumonia, unspecified organism: Secondary | ICD-10-CM | POA: Diagnosis not present

## 2015-04-24 DIAGNOSIS — R489 Unspecified symbolic dysfunctions: Secondary | ICD-10-CM | POA: Diagnosis not present

## 2015-04-24 DIAGNOSIS — F0390 Unspecified dementia without behavioral disturbance: Secondary | ICD-10-CM | POA: Diagnosis not present

## 2015-04-24 DIAGNOSIS — R1312 Dysphagia, oropharyngeal phase: Secondary | ICD-10-CM | POA: Diagnosis not present

## 2015-04-24 DIAGNOSIS — D6489 Other specified anemias: Secondary | ICD-10-CM | POA: Diagnosis not present

## 2015-04-24 DIAGNOSIS — I509 Heart failure, unspecified: Secondary | ICD-10-CM | POA: Diagnosis not present

## 2015-04-24 DIAGNOSIS — J9601 Acute respiratory failure with hypoxia: Secondary | ICD-10-CM | POA: Diagnosis not present

## 2015-04-24 DIAGNOSIS — R0902 Hypoxemia: Secondary | ICD-10-CM | POA: Diagnosis not present

## 2015-04-24 DIAGNOSIS — R6520 Severe sepsis without septic shock: Secondary | ICD-10-CM | POA: Diagnosis not present

## 2015-04-24 DIAGNOSIS — R918 Other nonspecific abnormal finding of lung field: Secondary | ICD-10-CM | POA: Diagnosis not present

## 2015-04-24 DIAGNOSIS — A419 Sepsis, unspecified organism: Secondary | ICD-10-CM | POA: Diagnosis not present

## 2015-04-24 LAB — MRSA PCR SCREENING: MRSA by PCR: NEGATIVE

## 2015-04-24 LAB — BASIC METABOLIC PANEL
ANION GAP: 9 (ref 5–15)
BUN: 35 mg/dL — AB (ref 6–20)
CHLORIDE: 93 mmol/L — AB (ref 101–111)
CO2: 38 mmol/L — ABNORMAL HIGH (ref 22–32)
Calcium: 9.2 mg/dL (ref 8.9–10.3)
Creatinine, Ser: 1.13 mg/dL — ABNORMAL HIGH (ref 0.44–1.00)
GFR calc Af Amer: 51 mL/min — ABNORMAL LOW (ref >60)
GFR, EST NON AFRICAN AMERICAN: 44 mL/min — AB (ref >60)
GLUCOSE: 108 mg/dL — AB (ref 65–99)
POTASSIUM: 2.7 mmol/L — AB (ref 3.5–5.1)
Sodium: 140 mmol/L (ref 135–145)

## 2015-04-24 LAB — POTASSIUM: Potassium: 3.5 mmol/L (ref 3.5–5.1)

## 2015-04-24 LAB — CBC
HEMATOCRIT: 33.5 % — AB (ref 36.0–46.0)
HEMOGLOBIN: 10 g/dL — AB (ref 12.0–15.0)
MCH: 21.4 pg — ABNORMAL LOW (ref 26.0–34.0)
MCHC: 29.9 g/dL — AB (ref 30.0–36.0)
MCV: 71.6 fL — AB (ref 78.0–100.0)
Platelets: 384 10*3/uL (ref 150–400)
RBC: 4.68 MIL/uL (ref 3.87–5.11)
RDW: 19.9 % — AB (ref 11.5–15.5)
WBC: 15 10*3/uL — ABNORMAL HIGH (ref 4.0–10.5)

## 2015-04-24 LAB — MAGNESIUM: MAGNESIUM: 1.9 mg/dL (ref 1.7–2.4)

## 2015-04-24 LAB — VANCOMYCIN, TROUGH: VANCOMYCIN TR: 17 ug/mL (ref 10.0–20.0)

## 2015-04-24 MED ORDER — IPRATROPIUM-ALBUTEROL 0.5-2.5 (3) MG/3ML IN SOLN: 3.0000 mL | RESPIRATORY_TRACT | Status: DC | PRN

## 2015-04-24 MED ORDER — FUROSEMIDE 40 MG PO TABS: 40.0000 mg | ORAL_TABLET | Freq: Every day | ORAL | Status: DC

## 2015-04-24 MED ORDER — POTASSIUM CHLORIDE CRYS ER 20 MEQ PO TBCR
60.0000 meq | EXTENDED_RELEASE_TABLET | Freq: Once | ORAL | Status: AC
  Administered 2015-04-24: 60 meq via ORAL
  Filled 2015-04-24: qty 3

## 2015-04-24 MED ORDER — POTASSIUM CHLORIDE ER 10 MEQ PO TBCR: 20.0000 meq | EXTENDED_RELEASE_TABLET | Freq: Every day | ORAL | Status: DC

## 2015-04-24 MED ORDER — BENZONATATE 100 MG PO CAPS: 100.0000 mg | ORAL_CAPSULE | Freq: Three times a day (TID) | ORAL | Status: DC | PRN

## 2015-04-24 MED ORDER — POTASSIUM CHLORIDE 10 MEQ/100ML IV SOLN
10.0000 meq | INTRAVENOUS | Status: AC
  Administered 2015-04-24 (×4): 10 meq via INTRAVENOUS
  Filled 2015-04-24 (×4): qty 100

## 2015-04-24 MED ORDER — AMOXICILLIN-POT CLAVULANATE 875-125 MG PO TABS: 1.0000 | ORAL_TABLET | Freq: Two times a day (BID) | ORAL | Status: DC

## 2015-04-24 MED ORDER — PREDNISONE 5 MG PO TABS: 5.0000 mg | ORAL_TABLET | Freq: Every day | ORAL | Status: DC

## 2015-04-24 NOTE — Progress Notes (Signed)
Dr Tana Coast paged with lumbar puncture spinal fluid results reported by lab.  Lab reported to RN spinal fluid showed white blood cells seen poly and mononuclear. No organisms seen.  Will continue to monitor patient.

## 2015-04-24 NOTE — Progress Notes (Signed)
CRITICAL VALUE ALERT  Critical value received:  K 2.7  Date of notification:  04/24/15   Time of notification:  0624  Critical value read back:Yes.    Nurse who received alert: Virgina Norfolk  MD notified (1st page):  Schorr  Time of first page:  0626  MD notified (2nd page):  Time of second page:  Responding MD:  Schorr  Time MD responded:  [phone]

## 2015-04-24 NOTE — Care Management Important Message (Signed)
Important Message  Patient Details  Name: Jamie [name] MRN: [id number] Date of Birth: 02/27/1930   Medicare Important Message Given:  Yes-third notification given    Camillo Flaming 04/24/2015, 12:17 Start Message  Patient Details  Name: Jamie [name] MRN: [id number] Date of Birth: February 08, 1930   Medicare Important Message Given:  Yes-third notification given    Camillo Flaming 04/24/2015, 12:17 PM

## 2015-04-24 NOTE — Care Management Note (Signed)
Case Management Note  Patient Details  Name: EYANA STOLZE MRN: [id number] Date of Birth: [birth year]  Subjective/Objective:       81 yo admitted with acute respiratory failure             Action/Plan: From [facility]  Expected Discharge Date:                  Expected Discharge Plan:     In-House Referral:  Clinical Social Work  Discharge planning Services  CM Consult  Post Acute Care Choice:    Choice offered to:     DME Arranged:    DME Agency:     HH Arranged:    [place] Agency:     Status of Service:  In process, will continue to follow  Medicare Important Message Given:  Yes-third notification given Date Medicare IM Given:    Medicare IM give by:    Date Additional Medicare IM Given:    Additional Medicare Important Message give by:     If discussed at [place] of Stay Meetings, dates discussed:    Additional Comments: Pt to DC to a memory care unit. No DC needs noted. Lynnell Catalan, RN 04/24/2015, 2:11 PM

## 2015-04-24 NOTE — Progress Notes (Signed)
Pt for discharge to [facility] memory care unit.   CSW facilitated pt discharge needs including contacting facility, faxing pt discharge information via [facility], contacted pt guardian, [name] via telephone and leaving two voice messages today, providing RN phone number to call report, and arranging ambulance transport for pt to [facility].   No further social work needs identified at this time.  CSW signing off.   [name], MSW, [place] Work [phone]

## 2015-04-24 NOTE — Clinical Social Work Placement (Signed)
   CLINICAL SOCIAL WORK PLACEMENT  NOTE  Date:  04/24/2015  Patient Details  Name: Jamie [name] MRN: [id number] Date of Birth: 30-Dec-1929  Clinical Social Work is seeking post-discharge placement for this patient at the [place] level of care (*CSW will initial, date and re-position this form in  chart as items are completed):  Yes   Patient/family provided with [place] Work Department's list of facilities offering this level of care within the geographic area requested by the patient (or if unable, by the patient's family).  Yes   Patient/family informed of their freedom to choose among providers that offer the needed level of care, that participate in Medicare, Medicaid or managed care program needed by the patient, have an available bed and are willing to accept the patient.  Yes   Patient/family informed of [place]'s ownership interest in [facility] and [facility], as well as of the fact that they are under no obligation to receive care at these facilities.  PASRR submitted to EDS on 04/23/15     PASRR number received on 04/23/15     Existing PASRR number confirmed on       FL2 transmitted to all facilities in geographic area requested by pt/family on 04/23/15     FL2 transmitted to all facilities within larger geographic area on       Patient informed that his/her managed care company has contracts with or will negotiate with certain facilities, including the following:        Yes   Patient/family informed of bed offers received.  Patient chooses bed at [facility]     Physician recommends and patient chooses bed at      Patient to be transferred to [facility] on 04/24/15.  Patient to be transferred to facility by ambulance Corey Harold)     Patient family notified on 04/24/15 of transfer.  Name of family member notified:  pt legal guardian, [name] notified via voice message, but was aware of transfer today  from discussion yesterday.     PHYSICIAN Please sign FL2     Additional Comment:    _______________________________________________ Ladell Pier, LCSW 04/24/2015, 4:11 PM

## 2015-04-24 NOTE — Progress Notes (Signed)
SATURATION QUALIFICATIONS: (This note is used to comply with regulatory documentation for home oxygen)  Patient Saturations on Room Air at Rest = 87 %  Patient Saturations on 2 Liters of oxygen while at rest = 96%  Please briefly explain why patient needs home oxygen: Desats on room air while at rest

## 2015-04-24 NOTE — Progress Notes (Signed)
Report called to Dietrich Pates, RN at [facility].

## 2015-04-24 NOTE — Discharge Summary (Signed)
Physician Discharge Summary  Jamie [name] XHB:[id number] DOB: 07-14-1930 DOA: 04/17/2015  PCP: Kennyth Arnold, FNP  Admit date: 04/17/2015 Discharge date: 04/24/2015  Time spent: 20 minutes  Recommendations for Outpatient Follow-up:  1. Follow up with PCP in 1-2 weeks 2. Repeat renal panel in 1 week, focus on Potassium and renal function 3. Please weigh patient daily and notify PCP if weight increases 5 pounds or more in 7 days  Discharge Diagnoses:  Principal Problem:   Acute respiratory failure with hypoxia and hypercapnia Active Problems:   Essential hypertension   Dementia   Acute respiratory failure with hypoxia   HCAP (healthcare-associated pneumonia)   Acute-on-chronic kidney injury   Anemia   Sepsis   Hypoxia   AKI (acute kidney injury)   Tachypnea   Discharge Condition: Improved  Diet recommendation: Heart healthy  Filed Weights   04/20/15 0508 04/22/15 1731 04/24/15 0506  Weight: 72.213 kg (159 lb 3.2 oz) 70.308 kg (155 lb) 68.7 kg (151 lb 7.3 oz)    History of present illness:  Please see admit h and p from 8/16 for details. Briefly, pt presented with marked SOB with coughing and fevers. The patient was admitted for further workup.  Hospital Course: 1. Acute respiratory failure with hypoxia 1. Suspected HCAP with concerns for possible aspiration and possible bronchiolitis, also noted to have pulmonary edema 2. Pt was continued on empiric vancomycin and zosyn 3. Pt was continued on scheduled nebs  4. Patient improved with gradual steroid taper, to be completed after discharge 5. 2d echo with reduced EF of 45-50%. Patient was continued on scheduled IV lasix, to be transitioned to PO lasix on discharge 6. O2 sats stable improving and now down to [facility] 7. Follow up cxr demonstrated interim partial clearing of R upper lobe infiltrate  2. Possible acutely decompensated systolic CHF 1. EF 81-01% 2. CXR with findings suggestive of pulm edema, overall much improved  by day of discharge 3. Pt is cont lasix, dose was decreased from BID to Qday given slight bump in Cr 4. Weight on discharge: 68.7kg 3. CKD, stage 1 1. Cr had improved and remains stable with diuresis 2. Cont monitor closely 3. As renal function had improved, had resumed lisinopril 4. HTN 1. bp stable and currently controlled 2. Cont monitor 3. Continued on lisinopril 4. Continue PRN hydralazine while inpatient 5. HLD 1. Continue statin per home regimen 6. Dementia 1. Stable 7. Hypokalemia 1. Likely secondary to diuresis 2. Replaced 3. Cont to follow lytes and correct as needed 8. Hypomagnesemia 1. Replaced and normalized 9. Elevated Troponin 1. Peak trop recently noted to be 0.82, now trending down 2. Case was discussed with Cardiology recently, with recs that elevated trop is likely trop leak from acute illness 3. Stable thus far   Discharge Exam: Filed Vitals:   04/24/15 0515 04/24/15 0530 04/24/15 0849 04/24/15 1021  BP:    132/66  Pulse:      Temp:      TempSrc:      Resp:      Height:      Weight:      SpO2: 87% 95% 98%     General: Awake, in nad Cardiovascular: regular, s1, s2 Respiratory: normal resp effort, no audible wheezing  Discharge Instructions     Medication List    STOP taking these medications        donepezil 5 MG tablet  Commonly known as:  ARICEPT     HYDROcodone-acetaminophen 5-325 MG per  tablet  Commonly known as:  NORCO/VICODIN     sulfamethoxazole-trimethoprim 800-160 MG per tablet  Commonly known as:  BACTRIM DS,SEPTRA DS      TAKE these medications        amoxicillin-clavulanate 875-125 MG per tablet  Commonly known as:  AUGMENTIN  Take 1 tablet by mouth 2 (two) times daily.     ARIPiprazole 5 MG tablet  Commonly known as:  ABILIFY  Take 5 mg by mouth at bedtime.     aspirin EC 81 MG tablet  Take 1 tablet (81 mg total) by mouth daily with breakfast. Resume in 2 weeks.     atorvastatin 20 MG tablet  Commonly known  as:  LIPITOR  Take 20 mg by mouth at bedtime.     benzonatate 100 MG capsule  Commonly known as:  TESSALON  Take 1 capsule (100 mg total) by mouth 3 (three) times daily as needed for cough.     Calcium Carbonate-Vitamin D 600-400 MG-UNIT per tablet  Take 1 tablet by mouth daily with breakfast.     fluticasone 50 MCG/ACT nasal spray  Commonly known as:  FLONASE  Place 2 sprays into both nostrils daily.     furosemide 40 MG tablet  Commonly known as:  LASIX  Take 1 tablet (40 mg total) by mouth daily.  Start taking on:  04/25/2015     gabapentin 100 MG capsule  Commonly known as:  NEURONTIN  Take 100 mg by mouth at bedtime.     hydroxypropyl methylcellulose / hypromellose 2.5 % ophthalmic solution  Commonly known as:  ISOPTO TEARS / GONIOVISC  Place 1 drop into both eyes every 2 (two) hours as needed for dry eyes.     ipratropium-albuterol 0.5-2.5 (3) MG/3ML Soln  Commonly known as:  DUONEB  Take 3 mLs by nebulization every 4 (four) hours as needed (wheezing, sob).     lisinopril 20 MG tablet  Commonly known as:  PRINIVIL,ZESTRIL  Take 20 mg by mouth daily.     loratadine 10 MG tablet  Commonly known as:  CLARITIN  Take 10 mg by mouth daily.     Melatonin 1 MG Tabs  Take 1 tablet by mouth at bedtime.     olopatadine 0.1 % ophthalmic solution  Commonly known as:  PATANOL  Place 1 drop into both eyes 2 (two) times daily.     pantoprazole 40 MG tablet  Commonly known as:  PROTONIX  Take 1 tablet (40 mg total) by mouth 2 (two) times daily before a meal. Take 1 tablet 2 times daily x 4 weeks, then 1 tablet daily.     polyethylene glycol packet  Commonly known as:  MIRALAX / GLYCOLAX  Take 17 g by mouth daily.     potassium chloride 10 MEQ tablet  Commonly known as:  K-DUR  Take 2 tablets (20 mEq total) by mouth daily.  Start taking on:  04/25/2015     predniSONE 5 MG tablet  Commonly known as:  DELTASONE  Take 1 tablet (5 mg total) by mouth daily with breakfast.      risperiDONE 0.5 MG tablet  Commonly known as:  RISPERDAL  Take 0.5-1 mg by mouth 2 (two) times daily. 1 tab in the morning and 2 tabs at bedtime     risperiDONE 1 MG tablet  Commonly known as:  RISPERDAL  Take 1 tablet (1 mg total) by mouth at bedtime.     senna 8.6 MG Tabs tablet  Commonly known  asDonavan Burnet  Take 1 tablet (8.6 mg total) by mouth at bedtime.     sertraline 50 MG tablet  Commonly known as:  ZOLOFT  Take 50 mg by mouth daily.     THERA-TABS M Tabs  Take 1 tablet by mouth daily with breakfast.     vitamin B-12 500 MCG tablet  Commonly known as:  CYANOCOBALAMIN  Take 1,000 mcg by mouth daily.     Vitamin D 2000 UNITS tablet  Take 2,000 Units by mouth daily.       Allergies  Allergen Reactions  . Pollen Extract Other (See Comments)    Swollen Eyes & Runny Nose.      The results of significant diagnostics from this hospitalization (including imaging, microbiology, ancillary and laboratory) are listed below for reference.    Significant Diagnostic Studies: Dg Chest Port 1 View  04/24/2015   CLINICAL DATA:  CHF.  EXAM: PORTABLE CHEST - 1 VIEW  COMPARISON:  04/19/2015.  FINDINGS: Patient is rotated to the right making evaluation difficult. Stable cardiomegaly. Thoracic aorta is tortuous. This is stable. Pulmonary vascularity is normal. Interim partial clearing of right upper lobe infiltrate. No pleural effusion or pneumothorax.  IMPRESSION: Interim partial clearing of right upper lobe infiltrate.   Electronically Signed   By: Marcello Moores  Register   On: 04/24/2015 07:28   Dg Chest Port 1 View  04/20/2015   CLINICAL DATA:  Congestive heart failure.  Confusion.  EXAM: PORTABLE CHEST - 1 VIEW  COMPARISON:  One-view chest x-ray 04/18/2015.  FINDINGS: The heart is enlarged. The aorta is prominent. Moderate interstitial edema is again noted. Recurrent right upper lobe airspace disease is evident. Mild left basilar airspace opacities are noted as well.  IMPRESSION: 1.  Cardiomegaly and persistent mild interstitial edema. 2. Right upper lobe airspace disease is concerning for pneumonia. 3. Mild left basilar airspace disease.   Electronically Signed   By: San Morelle M.D.   On: 04/20/2015 07:14   Dg Chest Port 1 View  04/18/2015   CLINICAL DATA:  Tachypnea and shortness of breath  EXAM: PORTABLE CHEST - 1 VIEW  COMPARISON:  Chest x-ray from yesterday  FINDINGS: Cardiomegaly. Prominent widening of the mediastinum from previously seen aortic tortuosity, accentuated by rightward rotation and vascular pedicle distension in the setting of volume overload.  Progressive diffuse interstitial opacity again with patchy right upper lobe airspace disease. No effusion.  IMPRESSION: 1. New pulmonary edema. 2. Persistent asymmetric right upper lobe opacity which could reflect edema or pneumonia. 3. Cardiomediastinal comparison limited by rotation.   Electronically Signed   By: Monte Fantasia M.D.   On: 04/18/2015 21:37   Dg Chest Port 1 View  04/17/2015   CLINICAL DATA:  Pneumonia. Shortness of breath. Symptoms started today.  EXAM: PORTABLE CHEST - 1 VIEW  COMPARISON:  12/27/2014  FINDINGS: Bilateral indistinct pulmonary vasculature observed with hazy opacities in the right upper lobe and potentially in the left upper lobe.  The patient is rotated to the right on today's radiograph, reducing diagnostic sensitivity and specificity. Atherosclerotic aortic arch noted. Possible underlying hiatal hernia.  IMPRESSION: 1. Indistinct densities in the upper lobes, right greater than left, potentially from aspiration pneumonitis, multilobar pneumonia, or acute pulmonary edema. Upper normal heart size. 2. Retrocardiac density, possibly from a chronic hiatal hernia. 3. Rightward mediastinal prominence is primarily due to rightward rotation. 4. Followup radiography to ensure clearance is recommended.   Electronically Signed   By: Van Clines M.D.   On:  04/17/2015 17:04   Dg Swallowing  Func-speech Pathology  04/19/2015    Objective Swallowing Evaluation:    Patient Details  Name: JAMIE-LEE GALDAMEZ MRN: [id number] Date of Birth: May 16, 1930  Today's Date: 04/19/2015 Time: SLP Start Time (ACUTE ONLY): 1225-SLP Stop Time (ACUTE ONLY): 1245 SLP Time Calculation (min) (ACUTE ONLY): 20 min  Past Medical History:  Past Medical History  Diagnosis Date  . Hyperlipidemia   . Hypertension   . Obesity     5'3"  . Gastric ulcer 05/2011  . Chronic pain   . Anxiety   . Depression    Past Surgical History:  Past Surgical History  Procedure Laterality Date  . Bilateral oophorectomy    . Skin biopsy      nose lesion, noncancerous  . Esophagogastroduodenoscopy  08/12/2012    Procedure: ESOPHAGOGASTRODUODENOSCOPY (EGD);  Surgeon: Arta Silence,  MD;  Location: [facility];  Service: Endoscopy;  Laterality: Left;  . Abdominal hysterectomy      PARTIAL  . Esophagogastroduodenoscopy (egd) with propofol N/A 12/28/2014    Procedure: ESOPHAGOGASTRODUODENOSCOPY (EGD) WITH PROPOFOL;  Surgeon:  Wilford Corner, MD;  Location: [facility];  Service: Endoscopy;   Laterality: N/A;   HPI:  Other Pertinent Information: 81 yo female adm to [facility] with respiratory  issues - increased shortness of breath - requiring Ventimask support - now  on nasal cannula.  Pt had episode over night of tachycardia and increased  respiratory issues - s/p lasix.  PMH + for obesity, gastric ulcer, HTN,  HLD, prior smoker.  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  Swallow eval ordered.   No Data Recorded  Assessment / Plan / Recommendation CHL IP CLINICAL IMPRESSIONS 04/19/2015  Therapy Diagnosis Mild oral pharyngeal  phase dysphagia  Clinical Impression Mild oral pharyngeal dysphagia resulting in delayed  oral transiting due to weakness- worse with solids than liquids. Minimal  oral residuals present with liquids without pt awareness- No  aspiration/penetration of any consistency tested. Pt benefited from cues  to swallow to  clear. Pharyngeal swallow overall strong. Pt does have  appearance of prominent cricopharyngeus. Of note, pt coughed during MBS  with no barium visualized in larynx. Esophageal sweep x1 completed with  appearance of adequate clearance. Barium tablet not tested due to pt  reporting need to use urinal.  Recommend dys3/thin for energy  conservation using aspiration precautions. Medicine with applesauce.using  teach back, Educated pt to findings.       CHL IP TREATMENT RECOMMENDATION 04/19/2015  Treatment Recommendations Therapy as outlined in treatment plan below     CHL IP DIET RECOMMENDATION 04/19/2015  SLP Diet Recommendations Dysphagia 3 (Mech soft);Thin  Liquid Administration via Cup, straw   Medication Administration Whole meds with puree  Compensations Small sips/bites;Slow rate, intermittent dry swallow, rest  if short of breath  Postural Changes and/or Swallow Maneuvers Reflux precautions     CHL IP OTHER RECOMMENDATIONS 04/19/2015  Recommended Consults (None)  Oral Care Recommendations Oral care BID  Other Recommendations (None)     No flowsheet data found.   CHL IP FREQUENCY AND DURATION 04/19/2015  Speech Therapy Frequency (ACUTE ONLY) min 2x/week  Treatment Duration 1 week         CHL IP REASON FOR REFERRAL 04/19/2015  Reason for Referral Objectively evaluate swallowing function     CHL IP ORAL PHASE 04/19/2015  Oral Phase Impaired      CHL IP PHARYNGEAL PHASE 04/19/2015  Pharyngeal Phase Impaired  CHL IP CERVICAL ESOPHAGEAL PHASE 04/19/2015  Cervical Esophageal Phase Impaired  Nectar Cup Prominent cricopharyngeal segment  Thin Teaspoon Prominent cricopharyngeal segment  Thin Cup Prominent cricopharyngeal segment  Thin Straw Prominent cricopharyngeal segment  Cervical Esophageal Comment upon esophageal sweep, pt appeared clear -  radiologist not present to confirm    No flowsheet data found.         Luanna Salk, [facility] SLP [phone]     Microbiology: Recent Results (from the past 240 hour(s))   Blood Culture (routine x 2)     Status: None   Collection Time: 04/17/15  4:55 PM  Result Value Ref Range Status   Specimen Description BLOOD RIGHT WRIST  Final   Special Requests BOTTLES DRAWN AEROBIC AND ANAEROBIC 5 CC EA  Final   Culture   Final    NO GROWTH 5 DAYS Performed at [facility]    Report Status 04/22/2015 FINAL  Final  Blood Culture (routine x 2)     Status: None   Collection Time: 04/17/15  4:57 PM  Result Value Ref Range Status   Specimen Description BLOOD LEFT ANTECUBITAL  Final   Special Requests BOTTLES DRAWN AEROBIC AND ANAEROBIC 5 CC EA  Final   Culture   Final    NO GROWTH 5 DAYS Performed at [facility]    Report Status 04/22/2015 FINAL  Final  Urine culture     Status: None   Collection Time: 04/17/15  6:03 PM  Result Value Ref Range Status   Specimen Description URINE, CATHETERIZED  Final   Special Requests NONE  Final   Culture   Final    NO GROWTH 2 DAYS Performed at [facility]    Report Status 04/19/2015 FINAL  Final  MRSA PCR Screening     Status: None   Collection Time: 04/23/15 10:27 PM  Result Value Ref Range Status   MRSA by PCR NEGATIVE NEGATIVE Final    Comment:        The GeneXpert MRSA Assay (FDA approved for NASAL specimens only), is one component of a comprehensive MRSA colonization surveillance program. It is not intended to diagnose MRSA infection nor to guide or monitor treatment for MRSA infections.      Labs: Basic Metabolic Panel:  Recent Labs Lab 04/19/15 0620 04/20/15 0805 04/21/15 0500 04/22/15 0536 04/23/15 0815 04/24/15 0518 04/24/15 1331  NA  --  142 143 143 140 140  --   K  --  3.1* 3.2* 3.7 3.3* 2.7* 3.5  CL  --  97* 98* 97* 94* 93*  --   CO2  --  36* 37* 37* 40* 38*  --   GLUCOSE  --  155* 174* 176* 165* 108*  --   BUN  --  31* 33* 34* 35* 35*  --   CREATININE  --  1.14* 1.15* 1.07* 1.11* 1.13*  --   CALCIUM  --  9.6 9.5 9.8 9.4 9.2  --   MG 1.5* 2.0  --   --   --  1.9   --    Liver Function Tests:  Recent Labs Lab 04/17/15 1655 04/18/15 0516  AST 43* 38  ALT 23 22  ALKPHOS 80 73  BILITOT 0.5 0.5  PROT 6.9 6.2*  ALBUMIN 3.4* 3.0*   No results for input(s): LIPASE, AMYLASE in the last 168 hours. No results for input(s): AMMONIA in the last 168 hours. CBC:  Recent Labs Lab 04/17/15 1655 04/18/15 0516  04/20/15  1572 04/21/15 0500 04/22/15 0536 04/23/15 0815 04/24/15 0518  WBC 7.8 8.3  < > 12.4* 11.9* 13.2* 14.1* 15.0*  NEUTROABS 5.1 7.7  --   --   --   --   --   --   HGB 9.1* 8.4*  < > 9.4* 9.2* 9.8* 10.6* 10.0*  HCT 30.2* 28.8*  < > 31.5* 32.0* 34.0* 35.7* 33.5*  MCV 70.7* 70.9*  < > 71.1* 71.0* 71.0* 71.7* 71.6*  PLT 250 232  < > 381 353 388 419* 384  < > = values in this interval not displayed. Cardiac Enzymes:  Recent Labs Lab 04/19/15 1941 04/19/15 2358 04/20/15 0805  TROPONINI 0.78* 0.82* 0.65*   BNP: BNP (last 3 results)  Recent Labs  04/17/15 1655  BNP 125.7*    ProBNP (last 3 results) No results for input(s): PROBNP in the last 8760 hours.  CBG: No results for input(s): GLUCAP in the last 168 hours.   Signed:  Karalyne Nusser, Orpah Melter  [facility] 04/24/2015, 2:33 PM

## 2015-04-26 DIAGNOSIS — F0390 Unspecified dementia without behavioral disturbance: Secondary | ICD-10-CM | POA: Diagnosis not present

## 2015-04-26 DIAGNOSIS — G894 Chronic pain syndrome: Secondary | ICD-10-CM | POA: Diagnosis not present

## 2015-04-26 DIAGNOSIS — J9601 Acute respiratory failure with hypoxia: Secondary | ICD-10-CM | POA: Diagnosis not present

## 2015-04-26 DIAGNOSIS — J189 Pneumonia, unspecified organism: Secondary | ICD-10-CM | POA: Diagnosis not present

## 2015-04-30 DIAGNOSIS — J189 Pneumonia, unspecified organism: Secondary | ICD-10-CM | POA: Diagnosis not present

## 2015-04-30 DIAGNOSIS — G4700 Insomnia, unspecified: Secondary | ICD-10-CM | POA: Diagnosis not present

## 2015-05-11 DIAGNOSIS — F0390 Unspecified dementia without behavioral disturbance: Secondary | ICD-10-CM | POA: Diagnosis not present

## 2015-05-11 DIAGNOSIS — G4700 Insomnia, unspecified: Secondary | ICD-10-CM | POA: Diagnosis not present

## 2015-05-21 DIAGNOSIS — F0391 Unspecified dementia with behavioral disturbance: Secondary | ICD-10-CM | POA: Diagnosis not present

## 2015-05-21 DIAGNOSIS — I10 Essential (primary) hypertension: Secondary | ICD-10-CM | POA: Diagnosis not present

## 2015-05-21 DIAGNOSIS — D6489 Other specified anemias: Secondary | ICD-10-CM | POA: Diagnosis not present

## 2015-05-21 DIAGNOSIS — N189 Chronic kidney disease, unspecified: Secondary | ICD-10-CM | POA: Diagnosis not present

## 2015-05-25 DIAGNOSIS — I130 Hypertensive heart and chronic kidney disease with heart failure and stage 1 through stage 4 chronic kidney disease, or unspecified chronic kidney disease: Secondary | ICD-10-CM | POA: Diagnosis not present

## 2015-05-25 DIAGNOSIS — I5022 Chronic systolic (congestive) heart failure: Secondary | ICD-10-CM | POA: Diagnosis not present

## 2015-05-25 DIAGNOSIS — F0390 Unspecified dementia without behavioral disturbance: Secondary | ICD-10-CM | POA: Diagnosis not present

## 2015-05-25 DIAGNOSIS — N181 Chronic kidney disease, stage 1: Secondary | ICD-10-CM | POA: Diagnosis not present

## 2015-05-25 DIAGNOSIS — G894 Chronic pain syndrome: Secondary | ICD-10-CM | POA: Diagnosis not present

## 2015-05-28 DIAGNOSIS — I5022 Chronic systolic (congestive) heart failure: Secondary | ICD-10-CM | POA: Diagnosis not present

## 2015-05-28 DIAGNOSIS — G894 Chronic pain syndrome: Secondary | ICD-10-CM | POA: Diagnosis not present

## 2015-05-28 DIAGNOSIS — F0390 Unspecified dementia without behavioral disturbance: Secondary | ICD-10-CM | POA: Diagnosis not present

## 2015-05-28 DIAGNOSIS — I130 Hypertensive heart and chronic kidney disease with heart failure and stage 1 through stage 4 chronic kidney disease, or unspecified chronic kidney disease: Secondary | ICD-10-CM | POA: Diagnosis not present

## 2015-05-28 DIAGNOSIS — N181 Chronic kidney disease, stage 1: Secondary | ICD-10-CM | POA: Diagnosis not present

## 2015-05-29 DIAGNOSIS — H6990 Unspecified Eustachian tube disorder, unspecified ear: Secondary | ICD-10-CM | POA: Diagnosis not present

## 2015-05-29 DIAGNOSIS — G894 Chronic pain syndrome: Secondary | ICD-10-CM | POA: Diagnosis not present

## 2015-05-29 DIAGNOSIS — I10 Essential (primary) hypertension: Secondary | ICD-10-CM | POA: Diagnosis not present

## 2015-05-29 DIAGNOSIS — N181 Chronic kidney disease, stage 1: Secondary | ICD-10-CM | POA: Diagnosis not present

## 2015-05-29 DIAGNOSIS — E785 Hyperlipidemia, unspecified: Secondary | ICD-10-CM | POA: Diagnosis not present

## 2015-05-29 DIAGNOSIS — I350 Nonrheumatic aortic (valve) stenosis: Secondary | ICD-10-CM | POA: Diagnosis not present

## 2015-05-29 DIAGNOSIS — F0390 Unspecified dementia without behavioral disturbance: Secondary | ICD-10-CM | POA: Diagnosis not present

## 2015-05-29 DIAGNOSIS — I130 Hypertensive heart and chronic kidney disease with heart failure and stage 1 through stage 4 chronic kidney disease, or unspecified chronic kidney disease: Secondary | ICD-10-CM | POA: Diagnosis not present

## 2015-05-29 DIAGNOSIS — I5022 Chronic systolic (congestive) heart failure: Secondary | ICD-10-CM | POA: Diagnosis not present

## 2015-05-31 DIAGNOSIS — I130 Hypertensive heart and chronic kidney disease with heart failure and stage 1 through stage 4 chronic kidney disease, or unspecified chronic kidney disease: Secondary | ICD-10-CM | POA: Diagnosis not present

## 2015-05-31 DIAGNOSIS — N181 Chronic kidney disease, stage 1: Secondary | ICD-10-CM | POA: Diagnosis not present

## 2015-05-31 DIAGNOSIS — F0390 Unspecified dementia without behavioral disturbance: Secondary | ICD-10-CM | POA: Diagnosis not present

## 2015-05-31 DIAGNOSIS — I5022 Chronic systolic (congestive) heart failure: Secondary | ICD-10-CM | POA: Diagnosis not present

## 2015-05-31 DIAGNOSIS — G894 Chronic pain syndrome: Secondary | ICD-10-CM | POA: Diagnosis not present

## 2015-06-05 DIAGNOSIS — N181 Chronic kidney disease, stage 1: Secondary | ICD-10-CM | POA: Diagnosis not present

## 2015-06-05 DIAGNOSIS — G894 Chronic pain syndrome: Secondary | ICD-10-CM | POA: Diagnosis not present

## 2015-06-05 DIAGNOSIS — F0390 Unspecified dementia without behavioral disturbance: Secondary | ICD-10-CM | POA: Diagnosis not present

## 2015-06-05 DIAGNOSIS — I5022 Chronic systolic (congestive) heart failure: Secondary | ICD-10-CM | POA: Diagnosis not present

## 2015-06-05 DIAGNOSIS — I130 Hypertensive heart and chronic kidney disease with heart failure and stage 1 through stage 4 chronic kidney disease, or unspecified chronic kidney disease: Secondary | ICD-10-CM | POA: Diagnosis not present

## 2015-06-07 DIAGNOSIS — N181 Chronic kidney disease, stage 1: Secondary | ICD-10-CM | POA: Diagnosis not present

## 2015-06-07 DIAGNOSIS — I5022 Chronic systolic (congestive) heart failure: Secondary | ICD-10-CM | POA: Diagnosis not present

## 2015-06-07 DIAGNOSIS — G894 Chronic pain syndrome: Secondary | ICD-10-CM | POA: Diagnosis not present

## 2015-06-07 DIAGNOSIS — I130 Hypertensive heart and chronic kidney disease with heart failure and stage 1 through stage 4 chronic kidney disease, or unspecified chronic kidney disease: Secondary | ICD-10-CM | POA: Diagnosis not present

## 2015-06-07 DIAGNOSIS — F0390 Unspecified dementia without behavioral disturbance: Secondary | ICD-10-CM | POA: Diagnosis not present

## 2015-06-08 DIAGNOSIS — I130 Hypertensive heart and chronic kidney disease with heart failure and stage 1 through stage 4 chronic kidney disease, or unspecified chronic kidney disease: Secondary | ICD-10-CM | POA: Diagnosis not present

## 2015-06-08 DIAGNOSIS — N181 Chronic kidney disease, stage 1: Secondary | ICD-10-CM | POA: Diagnosis not present

## 2015-06-08 DIAGNOSIS — G894 Chronic pain syndrome: Secondary | ICD-10-CM | POA: Diagnosis not present

## 2015-06-08 DIAGNOSIS — F0390 Unspecified dementia without behavioral disturbance: Secondary | ICD-10-CM | POA: Diagnosis not present

## 2015-06-08 DIAGNOSIS — I5022 Chronic systolic (congestive) heart failure: Secondary | ICD-10-CM | POA: Diagnosis not present

## 2015-06-13 DIAGNOSIS — N181 Chronic kidney disease, stage 1: Secondary | ICD-10-CM | POA: Diagnosis not present

## 2015-06-13 DIAGNOSIS — I130 Hypertensive heart and chronic kidney disease with heart failure and stage 1 through stage 4 chronic kidney disease, or unspecified chronic kidney disease: Secondary | ICD-10-CM | POA: Diagnosis not present

## 2015-06-13 DIAGNOSIS — G894 Chronic pain syndrome: Secondary | ICD-10-CM | POA: Diagnosis not present

## 2015-06-13 DIAGNOSIS — F0390 Unspecified dementia without behavioral disturbance: Secondary | ICD-10-CM | POA: Diagnosis not present

## 2015-06-13 DIAGNOSIS — I5022 Chronic systolic (congestive) heart failure: Secondary | ICD-10-CM | POA: Diagnosis not present

## 2015-06-14 DIAGNOSIS — F0390 Unspecified dementia without behavioral disturbance: Secondary | ICD-10-CM | POA: Diagnosis not present

## 2015-06-14 DIAGNOSIS — I130 Hypertensive heart and chronic kidney disease with heart failure and stage 1 through stage 4 chronic kidney disease, or unspecified chronic kidney disease: Secondary | ICD-10-CM | POA: Diagnosis not present

## 2015-06-14 DIAGNOSIS — I5022 Chronic systolic (congestive) heart failure: Secondary | ICD-10-CM | POA: Diagnosis not present

## 2015-06-14 DIAGNOSIS — N181 Chronic kidney disease, stage 1: Secondary | ICD-10-CM | POA: Diagnosis not present

## 2015-06-14 DIAGNOSIS — G894 Chronic pain syndrome: Secondary | ICD-10-CM | POA: Diagnosis not present

## 2015-06-15 DIAGNOSIS — F0390 Unspecified dementia without behavioral disturbance: Secondary | ICD-10-CM | POA: Diagnosis not present

## 2015-06-15 DIAGNOSIS — G894 Chronic pain syndrome: Secondary | ICD-10-CM | POA: Diagnosis not present

## 2015-06-15 DIAGNOSIS — I130 Hypertensive heart and chronic kidney disease with heart failure and stage 1 through stage 4 chronic kidney disease, or unspecified chronic kidney disease: Secondary | ICD-10-CM | POA: Diagnosis not present

## 2015-06-15 DIAGNOSIS — I5022 Chronic systolic (congestive) heart failure: Secondary | ICD-10-CM | POA: Diagnosis not present

## 2015-06-15 DIAGNOSIS — N181 Chronic kidney disease, stage 1: Secondary | ICD-10-CM | POA: Diagnosis not present

## 2015-06-20 DIAGNOSIS — G894 Chronic pain syndrome: Secondary | ICD-10-CM | POA: Diagnosis not present

## 2015-06-20 DIAGNOSIS — I130 Hypertensive heart and chronic kidney disease with heart failure and stage 1 through stage 4 chronic kidney disease, or unspecified chronic kidney disease: Secondary | ICD-10-CM | POA: Diagnosis not present

## 2015-06-20 DIAGNOSIS — I5022 Chronic systolic (congestive) heart failure: Secondary | ICD-10-CM | POA: Diagnosis not present

## 2015-06-20 DIAGNOSIS — F0390 Unspecified dementia without behavioral disturbance: Secondary | ICD-10-CM | POA: Diagnosis not present

## 2015-06-20 DIAGNOSIS — N181 Chronic kidney disease, stage 1: Secondary | ICD-10-CM | POA: Diagnosis not present

## 2015-06-21 ENCOUNTER — Inpatient Hospital Stay (HOSPITAL_COMMUNITY)
Admission: EM | Admit: 2015-06-21 | Discharge: 2015-06-25 | DRG: 315 | Disposition: A | Discharge status: Skilled Nursing Facility | Payer: Medicare Other | Attending: Internal Medicine | Admitting: Internal Medicine

## 2015-06-21 ENCOUNTER — Emergency Department (HOSPITAL_COMMUNITY): Payer: Medicare Other

## 2015-06-21 DIAGNOSIS — Z7951 Long term (current) use of inhaled steroids: Secondary | ICD-10-CM

## 2015-06-21 DIAGNOSIS — N179 Acute kidney failure, unspecified: Secondary | ICD-10-CM | POA: Diagnosis not present

## 2015-06-21 DIAGNOSIS — R7989 Other specified abnormal findings of blood chemistry: Secondary | ICD-10-CM

## 2015-06-21 DIAGNOSIS — N183 Chronic kidney disease, stage 3 (moderate): Secondary | ICD-10-CM | POA: Diagnosis not present

## 2015-06-21 DIAGNOSIS — Z66 Do not resuscitate: Secondary | ICD-10-CM | POA: Diagnosis present

## 2015-06-21 DIAGNOSIS — E861 Hypovolemia: Secondary | ICD-10-CM

## 2015-06-21 DIAGNOSIS — I5042 Chronic combined systolic (congestive) and diastolic (congestive) heart failure: Secondary | ICD-10-CM | POA: Diagnosis present

## 2015-06-21 DIAGNOSIS — I350 Nonrheumatic aortic (valve) stenosis: Secondary | ICD-10-CM | POA: Diagnosis present

## 2015-06-21 DIAGNOSIS — Z8711 Personal history of peptic ulcer disease: Secondary | ICD-10-CM | POA: Diagnosis not present

## 2015-06-21 DIAGNOSIS — Z79899 Other long term (current) drug therapy: Secondary | ICD-10-CM

## 2015-06-21 DIAGNOSIS — H6120 Impacted cerumen, unspecified ear: Secondary | ICD-10-CM | POA: Diagnosis present

## 2015-06-21 DIAGNOSIS — D62 Acute posthemorrhagic anemia: Secondary | ICD-10-CM | POA: Diagnosis not present

## 2015-06-21 DIAGNOSIS — Z7982 Long term (current) use of aspirin: Secondary | ICD-10-CM | POA: Diagnosis not present

## 2015-06-21 DIAGNOSIS — Z515 Encounter for palliative care: Secondary | ICD-10-CM | POA: Diagnosis not present

## 2015-06-21 DIAGNOSIS — D509 Iron deficiency anemia, unspecified: Secondary | ICD-10-CM | POA: Diagnosis not present

## 2015-06-21 DIAGNOSIS — F419 Anxiety disorder, unspecified: Secondary | ICD-10-CM | POA: Diagnosis present

## 2015-06-21 DIAGNOSIS — F329 Major depressive disorder, single episode, unspecified: Secondary | ICD-10-CM | POA: Diagnosis not present

## 2015-06-21 DIAGNOSIS — F0391 Unspecified dementia with behavioral disturbance: Secondary | ICD-10-CM | POA: Diagnosis present

## 2015-06-21 DIAGNOSIS — N189 Chronic kidney disease, unspecified: Secondary | ICD-10-CM | POA: Diagnosis present

## 2015-06-21 DIAGNOSIS — E860 Dehydration: Secondary | ICD-10-CM | POA: Diagnosis present

## 2015-06-21 DIAGNOSIS — Z7952 Long term (current) use of systemic steroids: Secondary | ICD-10-CM | POA: Diagnosis not present

## 2015-06-21 DIAGNOSIS — G894 Chronic pain syndrome: Secondary | ICD-10-CM | POA: Diagnosis not present

## 2015-06-21 DIAGNOSIS — E785 Hyperlipidemia, unspecified: Secondary | ICD-10-CM | POA: Diagnosis present

## 2015-06-21 DIAGNOSIS — E872 Acidosis: Secondary | ICD-10-CM | POA: Diagnosis not present

## 2015-06-21 DIAGNOSIS — Z87891 Personal history of nicotine dependence: Secondary | ICD-10-CM | POA: Diagnosis not present

## 2015-06-21 DIAGNOSIS — R079 Chest pain, unspecified: Secondary | ICD-10-CM | POA: Diagnosis present

## 2015-06-21 DIAGNOSIS — G8929 Other chronic pain: Secondary | ICD-10-CM | POA: Diagnosis present

## 2015-06-21 DIAGNOSIS — I9589 Other hypotension: Secondary | ICD-10-CM | POA: Diagnosis not present

## 2015-06-21 DIAGNOSIS — I248 Other forms of acute ischemic heart disease: Secondary | ICD-10-CM | POA: Diagnosis not present

## 2015-06-21 DIAGNOSIS — F0390 Unspecified dementia without behavioral disturbance: Secondary | ICD-10-CM | POA: Diagnosis not present

## 2015-06-21 DIAGNOSIS — I130 Hypertensive heart and chronic kidney disease with heart failure and stage 1 through stage 4 chronic kidney disease, or unspecified chronic kidney disease: Secondary | ICD-10-CM | POA: Diagnosis present

## 2015-06-21 DIAGNOSIS — D649 Anemia, unspecified: Secondary | ICD-10-CM

## 2015-06-21 DIAGNOSIS — R778 Other specified abnormalities of plasma proteins: Secondary | ICD-10-CM | POA: Diagnosis present

## 2015-06-21 DIAGNOSIS — Z6826 Body mass index (BMI) 26.0-26.9, adult: Secondary | ICD-10-CM

## 2015-06-21 DIAGNOSIS — I209 Angina pectoris, unspecified: Secondary | ICD-10-CM

## 2015-06-21 DIAGNOSIS — I214 Non-ST elevation (NSTEMI) myocardial infarction: Secondary | ICD-10-CM

## 2015-06-21 DIAGNOSIS — N181 Chronic kidney disease, stage 1: Secondary | ICD-10-CM | POA: Diagnosis not present

## 2015-06-21 DIAGNOSIS — R0789 Other chest pain: Secondary | ICD-10-CM | POA: Diagnosis not present

## 2015-06-21 DIAGNOSIS — E669 Obesity, unspecified: Secondary | ICD-10-CM | POA: Diagnosis not present

## 2015-06-21 DIAGNOSIS — I959 Hypotension, unspecified: Secondary | ICD-10-CM | POA: Diagnosis not present

## 2015-06-21 DIAGNOSIS — I5022 Chronic systolic (congestive) heart failure: Secondary | ICD-10-CM | POA: Diagnosis not present

## 2015-06-21 DIAGNOSIS — F03918 Unspecified dementia, unspecified severity, with other behavioral disturbance: Secondary | ICD-10-CM | POA: Diagnosis present

## 2015-06-21 DIAGNOSIS — K449 Diaphragmatic hernia without obstruction or gangrene: Secondary | ICD-10-CM | POA: Diagnosis not present

## 2015-06-21 LAB — COMPREHENSIVE METABOLIC PANEL
ALBUMIN: 3.1 g/dL — AB (ref 3.5–5.0)
ALK PHOS: 53 U/L (ref 38–126)
ALT: 17 U/L (ref 14–54)
ANION GAP: 9 (ref 5–15)
AST: 55 U/L — ABNORMAL HIGH (ref 15–41)
BILIRUBIN TOTAL: 0.4 mg/dL (ref 0.3–1.2)
BUN: 33 mg/dL — ABNORMAL HIGH (ref 6–20)
CALCIUM: 9.7 mg/dL (ref 8.9–10.3)
CO2: 22 mmol/L (ref 22–32)
CREATININE: 2.27 mg/dL — AB (ref 0.44–1.00)
Chloride: 104 mmol/L (ref 101–111)
GFR calc Af Amer: 22 mL/min — ABNORMAL LOW (ref >60)
GFR calc non Af Amer: 19 mL/min — ABNORMAL LOW (ref >60)
GLUCOSE: 119 mg/dL — AB (ref 65–99)
Potassium: 4.1 mmol/L (ref 3.5–5.1)
Sodium: 135 mmol/L (ref 135–145)
TOTAL PROTEIN: 5.6 g/dL — AB (ref 6.5–8.1)

## 2015-06-21 LAB — URINE MICROSCOPIC-ADD ON

## 2015-06-21 LAB — CBC WITH DIFFERENTIAL/PLATELET
BASOS ABS: 0 10*3/uL (ref 0.0–0.1)
Basophils Relative: 0 %
EOS ABS: 0.1 10*3/uL (ref 0.0–0.7)
Eosinophils Relative: 1 %
HCT: 17.7 % — ABNORMAL LOW (ref 36.0–46.0)
HEMOGLOBIN: 5.1 g/dL — AB (ref 12.0–15.0)
LYMPHS ABS: 1.7 10*3/uL (ref 0.7–4.0)
LYMPHS PCT: 16 %
MCH: 21.5 pg — ABNORMAL LOW (ref 26.0–34.0)
MCHC: 28.8 g/dL — AB (ref 30.0–36.0)
MCV: 74.7 fL — ABNORMAL LOW (ref 78.0–100.0)
MONO ABS: 0.6 10*3/uL (ref 0.1–1.0)
Monocytes Relative: 6 %
NEUTROS ABS: 8.2 10*3/uL — AB (ref 1.7–7.7)
Neutrophils Relative %: 77 %
PLATELETS: 422 10*3/uL — AB (ref 150–400)
RBC: 2.37 MIL/uL — ABNORMAL LOW (ref 3.87–5.11)
RDW: 21.4 % — AB (ref 11.5–15.5)
WBC: 10.6 10*3/uL — ABNORMAL HIGH (ref 4.0–10.5)

## 2015-06-21 LAB — I-STAT TROPONIN, ED: Troponin i, poc: 5.23 ng/mL (ref 0.00–0.08)

## 2015-06-21 LAB — URINALYSIS, ROUTINE W REFLEX MICROSCOPIC
BILIRUBIN URINE: NEGATIVE
GLUCOSE, UA: NEGATIVE mg/dL
Ketones, ur: NEGATIVE mg/dL
Leukocytes, UA: NEGATIVE
Nitrite: NEGATIVE
PROTEIN: NEGATIVE mg/dL
Specific Gravity, Urine: 1.009 (ref 1.005–1.030)
UROBILINOGEN UA: 0.2 mg/dL (ref 0.0–1.0)
pH: 6.5 (ref 5.0–8.0)

## 2015-06-21 LAB — I-STAT CG4 LACTIC ACID, ED
LACTIC ACID, VENOUS: 3.78 mmol/L — AB (ref 0.5–2.0)
LACTIC ACID, VENOUS: 0.87 mmol/L (ref 0.5–2.0)

## 2015-06-21 LAB — RETICULOCYTES
RBC.: 3.1 MIL/uL — ABNORMAL LOW (ref 3.87–5.11)
Retic Count, Absolute: 68.2 10*3/uL (ref 19.0–186.0)
Retic Ct Pct: 2.2 % (ref 0.4–3.1)

## 2015-06-21 LAB — TROPONIN I
Troponin I: 6.3 ng/mL (ref <0.031)
Troponin I: 8.22 ng/mL (ref <0.031)

## 2015-06-21 LAB — BRAIN NATRIURETIC PEPTIDE: B NATRIURETIC PEPTIDE 5: 2094.4 pg/mL — AB (ref 0.0–100.0)

## 2015-06-21 LAB — LIPASE, BLOOD: LIPASE: 53 U/L — AB (ref 11–51)

## 2015-06-21 LAB — SAVE SMEAR

## 2015-06-21 LAB — PREPARE RBC (CROSSMATCH)

## 2015-06-21 LAB — PROTIME-INR
INR: 1.11 (ref 0.00–1.49)
PROTHROMBIN TIME: 14.5 s (ref 11.6–15.2)

## 2015-06-21 LAB — POC OCCULT BLOOD, ED: Fecal Occult Bld: NEGATIVE

## 2015-06-21 LAB — APTT: aPTT: 24 seconds (ref 24–37)

## 2015-06-21 LAB — ABO/RH: ABO/RH(D): O POS

## 2015-06-21 LAB — MRSA PCR SCREENING: MRSA by PCR: NEGATIVE

## 2015-06-21 MED ORDER — RISPERIDONE 0.5 MG PO TABS: 0.5000 mg | ORAL_TABLET | Freq: Two times a day (BID) | ORAL | Status: DC

## 2015-06-21 MED ORDER — LORATADINE 10 MG PO TABS
10.0000 mg | ORAL_TABLET | Freq: Every day | ORAL | Status: DC
  Administered 2015-06-22: 10 mg via ORAL
  Filled 2015-06-21: qty 1

## 2015-06-21 MED ORDER — RISPERIDONE 0.5 MG PO TABS
0.5000 mg | ORAL_TABLET | Freq: Every day | ORAL | Status: DC
  Administered 2015-06-22 – 2015-06-25 (×4): 0.5 mg via ORAL
  Filled 2015-06-21 (×4): qty 1

## 2015-06-21 MED ORDER — SODIUM CHLORIDE 0.9 % IV BOLUS (SEPSIS)
1000.0000 mL | Freq: Once | INTRAVENOUS | Status: AC
Start: 2015-06-21 — End: 2015-06-21
  Administered 2015-06-21: 1000 mL via INTRAVENOUS

## 2015-06-21 MED ORDER — SODIUM CHLORIDE 0.9 % IV BOLUS (SEPSIS)
1000.0000 mL | Freq: Once | INTRAVENOUS | Status: AC
  Administered 2015-06-21: 1000 mL via INTRAVENOUS

## 2015-06-21 MED ORDER — RISPERIDONE 1 MG PO TABS
1.0000 mg | ORAL_TABLET | Freq: Every day | ORAL | Status: DC
  Administered 2015-06-21 – 2015-06-24 (×4): 1 mg via ORAL
  Filled 2015-06-21 (×6): qty 1

## 2015-06-21 MED ORDER — MORPHINE SULFATE (PF) 2 MG/ML IV SOLN
1.0000 mg | INTRAVENOUS | Status: DC | PRN
  Administered 2015-06-24: 1 mg via INTRAVENOUS
  Filled 2015-06-21: qty 1

## 2015-06-21 MED ORDER — ONDANSETRON HCL 4 MG/2ML IJ SOLN: 4.0000 mg | Freq: Four times a day (QID) | INTRAMUSCULAR | Status: DC | PRN

## 2015-06-21 MED ORDER — ARIPIPRAZOLE 5 MG PO TABS
5.0000 mg | ORAL_TABLET | Freq: Every day | ORAL | Status: DC
  Administered 2015-06-21 – 2015-06-24 (×4): 5 mg via ORAL
  Filled 2015-06-21 (×7): qty 1

## 2015-06-21 MED ORDER — SODIUM CHLORIDE 0.9 % IV SOLN
80.0000 mg | Freq: Once | INTRAVENOUS | Status: DC
  Filled 2015-06-21: qty 80

## 2015-06-21 MED ORDER — FLUTICASONE PROPIONATE 50 MCG/ACT NA SUSP
2.0000 | Freq: Every day | NASAL | Status: DC
  Administered 2015-06-21 – 2015-06-23 (×3): 2 via NASAL
  Filled 2015-06-21: qty 16

## 2015-06-21 MED ORDER — SODIUM CHLORIDE 0.9 % IV SOLN: Freq: Once | INTRAVENOUS | Status: DC

## 2015-06-21 MED ORDER — ONDANSETRON HCL 4 MG PO TABS: 4.0000 mg | ORAL_TABLET | Freq: Four times a day (QID) | ORAL | Status: DC | PRN

## 2015-06-21 MED ORDER — SERTRALINE HCL 50 MG PO TABS
50.0000 mg | ORAL_TABLET | Freq: Every day | ORAL | Status: DC
Start: 2015-06-21 — End: 2015-06-26
  Administered 2015-06-21 – 2015-06-25 (×5): 50 mg via ORAL
  Filled 2015-06-21 (×5): qty 1

## 2015-06-21 MED ORDER — SODIUM CHLORIDE 0.9 % IJ SOLN
3.0000 mL | Freq: Two times a day (BID) | INTRAMUSCULAR | Status: DC
  Administered 2015-06-21 – 2015-06-24 (×6): 3 mL via INTRAVENOUS

## 2015-06-21 MED ORDER — CYANOCOBALAMIN 500 MCG PO TABS
1000.0000 ug | ORAL_TABLET | Freq: Every day | ORAL | Status: DC
  Administered 2015-06-22 – 2015-06-25 (×4): 1000 ug via ORAL
  Filled 2015-06-21 (×4): qty 2

## 2015-06-21 MED ORDER — SODIUM CHLORIDE 0.9 % IV SOLN
8.0000 mg/h | INTRAVENOUS | Status: DC
  Administered 2015-06-21 – 2015-06-23 (×4): 8 mg/h via INTRAVENOUS
  Filled 2015-06-21 (×8): qty 80

## 2015-06-21 MED ORDER — POLYETHYLENE GLYCOL 3350 17 G PO PACK
17.0000 g | PACK | Freq: Every day | ORAL | Status: DC
  Administered 2015-06-21 – 2015-06-25 (×5): 17 g via ORAL
  Filled 2015-06-21 (×5): qty 1

## 2015-06-21 MED ORDER — ATORVASTATIN CALCIUM 20 MG PO TABS
20.0000 mg | ORAL_TABLET | Freq: Every day | ORAL | Status: DC
  Administered 2015-06-21 – 2015-06-24 (×4): 20 mg via ORAL
  Filled 2015-06-21 (×4): qty 1

## 2015-06-21 MED ORDER — ACETAMINOPHEN 650 MG RE SUPP: 650.0000 mg | Freq: Four times a day (QID) | RECTAL | Status: DC | PRN

## 2015-06-21 MED ORDER — SENNA 8.6 MG PO TABS
1.0000 | ORAL_TABLET | Freq: Every day | ORAL | Status: DC
  Administered 2015-06-21 – 2015-06-24 (×4): 8.6 mg via ORAL
  Filled 2015-06-21 (×4): qty 1

## 2015-06-21 MED ORDER — PANTOPRAZOLE SODIUM 40 MG IV SOLR: 40.0000 mg | Freq: Two times a day (BID) | INTRAVENOUS | Status: DC

## 2015-06-21 MED ORDER — VITAMIN D 1000 UNITS PO TABS
2000.0000 [IU] | ORAL_TABLET | Freq: Every day | ORAL | Status: DC
  Administered 2015-06-21 – 2015-06-22 (×2): 2000 [IU] via ORAL
  Filled 2015-06-21 (×3): qty 2

## 2015-06-21 MED ORDER — ACETAMINOPHEN 325 MG PO TABS: 650.0000 mg | ORAL_TABLET | Freq: Four times a day (QID) | ORAL | Status: DC | PRN

## 2015-06-21 MED ORDER — GABAPENTIN 100 MG PO CAPS
100.0000 mg | ORAL_CAPSULE | Freq: Every day | ORAL | Status: DC
  Administered 2015-06-21 – 2015-06-24 (×4): 100 mg via ORAL
  Filled 2015-06-21 (×4): qty 1

## 2015-06-21 MED ORDER — POLYVINYL ALCOHOL 1.4 % OP SOLN
1.0000 [drp] | Freq: Three times a day (TID) | OPHTHALMIC | Status: DC
  Administered 2015-06-21 – 2015-06-25 (×12): 1 [drp] via OPHTHALMIC
  Filled 2015-06-21: qty 15

## 2015-06-21 NOTE — Consult Note (Signed)
Name: JOEL MERICLE MRN: [id number] DOB: 1930/07/25    ADMISSION DATE:  06/21/2015 CONSULTATION DATE:  06/21/2015  REFERRING MD :  EDP  CHIEF COMPLAINT:  CP  BRIEF PATIENT DESCRIPTION: 81 year old female presented from dementia unit at [place] complaining of chest pain. In ED troponin and lactic were elevated in setting of profound anemia with hemoglobin 5. Symptoms, lactic improved with IVF. PCCM asked to eval.  SIGNIFICANT EVENTS  8/16 admit to [facility] for GIB/HCAP 10/20 admit  STUDIES:    HISTORY OF PRESENT ILLNESS:  81 year old female with PMH as below, which includes HTN, Systolic CHF, HLD, HCAP, GIB from PUD, and dementia. Marland Kitchen She was recently admitted to [facility] 04/2015 with respiratory failure thought secondary to primarily HCAP, but also component CHF, COPD. She was discharged after about a week of ABX, steroids, nebs, and diuresis. She resides in dementia unit at [facility]. 10/12 she was complaining to staff members there of chest pain. She was transported to ED, where she was found to be hypotensive. Initial workup revealed elevated troponin, lactic acid, and a hemoglobin of 5.1. Her baseline was 10 at the time of her most recent discharge 04/2015. No ST elevation on EKG. She was given IVF resuscitation and SBP improved nicely. She is scheduled to receive PRBC as well. PCCM consulted for further evaluation.   PAST MEDICAL HISTORY :   has a past medical history of Hyperlipidemia; Hypertension; Obesity; Gastric ulcer (05/2011); Chronic pain; Anxiety; and Depression.  has past surgical history that includes Bilateral oophorectomy; skin biopsy; Esophagogastroduodenoscopy (08/12/2012); Abdominal hysterectomy; and Esophagogastroduodenoscopy (egd) with propofol (N/A, 12/28/2014). Prior to Admission medications   Medication Sig Start Date End Date Taking? Authorizing Provider  ARIPiprazole (ABILIFY) 5 MG tablet Take 5 mg by mouth at bedtime.   Yes Historical Provider, MD  aspirin EC 81 MG  tablet Take 1 tablet (81 mg total) by mouth daily with breakfast. Resume in 2 weeks. 01/15/15  Yes Eugenie Filler, MD  atorvastatin (LIPITOR) 20 MG tablet Take 20 mg by mouth at bedtime.   Yes Historical Provider, MD  Cholecalciferol (VITAMIN D) 2000 UNITS tablet Take 2,000 Units by mouth daily.   Yes Historical Provider, MD  fluticasone (FLONASE) 50 MCG/ACT nasal spray Place 2 sprays into both nostrils daily.   Yes Historical Provider, MD  furosemide (LASIX) 40 MG tablet Take 1 tablet (40 mg total) by mouth daily. 04/25/15  Yes Donne Hazel, MD  gabapentin (NEURONTIN) 100 MG capsule Take 100 mg by mouth at bedtime.   Yes Historical Provider, MD  Hypromellose (ISOPTO TEARS) 0.5 % SOLN Apply 1 drop to eye 3 (three) times daily. One drop in each eye, three times daily.   Yes Historical Provider, MD  lisinopril (PRINIVIL,ZESTRIL) 20 MG tablet Take 20 mg by mouth daily.   Yes Historical Provider, MD  loratadine (CLARITIN) 10 MG tablet Take 10 mg by mouth daily.   Yes Historical Provider, MD  Melatonin 1 MG TABS Take 1 tablet by mouth at bedtime.   Yes Historical Provider, MD  Multiple Vitamins-Minerals (THERA-TABS M) TABS Take 1 tablet by mouth daily with breakfast.   Yes Historical Provider, MD  olopatadine (PATANOL) 0.1 % ophthalmic solution Place 1 drop into both eyes 2 (two) times daily.   Yes Historical Provider, MD  pantoprazole (PROTONIX) 40 MG tablet Take 1 tablet (40 mg total) by mouth 2 (two) times daily before a meal. Take 1 tablet 2 times daily x 4 weeks, then  1 tablet daily. Patient taking differently: Take 40 mg by mouth 2 (two) times daily before a meal.  01/01/15  Yes Eugenie Filler, MD  polyethylene glycol (MIRALAX / GLYCOLAX) packet Take 17 g by mouth daily.   Yes Historical Provider, MD  potassium chloride (K-DUR) 10 MEQ tablet Take 2 tablets (20 mEq total) by mouth daily. 04/25/15  Yes Donne Hazel, MD  risperiDONE (RISPERDAL) 0.5 MG tablet Take 0.5-1 mg by mouth 2 (two) times  daily. 1 tab in the morning and 2 tabs at bedtime   Yes Historical Provider, MD  risperiDONE (RISPERDAL) 1 MG tablet Take 1 tablet (1 mg total) by mouth at bedtime. 08/14/14  Yes Kennyth Arnold, FNP  senna (SENOKOT) 8.6 MG TABS tablet Take 1 tablet (8.6 mg total) by mouth at bedtime. 01/01/15  Yes Eugenie Filler, MD  sertraline (ZOLOFT) 50 MG tablet Take 50 mg by mouth daily.   Yes Historical Provider, MD  vitamin B-12 (CYANOCOBALAMIN) 500 MCG tablet Take 1,000 mcg by mouth daily.    Yes Historical Provider, MD  benzonatate (TESSALON) 100 MG capsule Take 1 capsule (100 mg total) by mouth 3 (three) times daily as needed for cough. Patient not taking: Reported on 06/21/2015 04/24/15   Donne Hazel, MD  hydroxypropyl methylcellulose / hypromellose (ISOPTO TEARS / GONIOVISC) 2.5 % ophthalmic solution Place 1 drop into both eyes every 2 (two) hours as needed for dry eyes.    Historical Provider, MD  ipratropium-albuterol (DUONEB) 0.5-2.5 (3) MG/3ML SOLN Take 3 mLs by nebulization every 4 (four) hours as needed (wheezing, sob). Patient not taking: Reported on 06/21/2015 04/24/15   Donne Hazel, MD  predniSONE (DELTASONE) 5 MG tablet Take 1 tablet (5 mg total) by mouth daily with breakfast. Patient not taking: Reported on 06/21/2015 04/24/15   Donne Hazel, MD   Allergies  Allergen Reactions  . Pollen Extract Other (See Comments)    Swollen Eyes & Runny Nose.    FAMILY HISTORY:  family history includes Cancer in her brother; Heart disease in her father; Hyperlipidemia in her mother; Hypertension in her mother. SOCIAL HISTORY:  reports that she quit smoking about 43 years ago. Her smoking use included Cigarettes. She smoked 1.00 pack per day. She has never used smokeless tobacco. She reports that she drinks alcohol. She reports that she does not use illicit drugs.  REVIEW OF SYSTEMS: limited by dementia Bolds are positive  Constitutional: weight loss, gain, night sweats, Fevers, chills, fatigue  .  HEENT: headaches, Sore throat, sneezing, nasal congestion, post nasal drip, Difficulty swallowing, Tooth/dental problems, visual complaints visual changes, ear ache CV:  chest pain, radiates: ,Orthopnea, PND, swelling in lower extremities, dizziness, palpitations, syncope.  GI  heartburn, indigestion, abdominal pain, nausea, vomiting, diarrhea, change in bowel habits, loss of appetite, bloody stools.  Resp: cough, productive: , hemoptysis, dyspnea, chest pain, pleuritic.  Skin: rash or itching or icterus GU: dysuria, change in color of urine, urgency or frequency. flank pain, hematuria  MS: joint pain or swelling. decreased range of motion  Psych: change in mood or affect. depression or anxiety.  Neuro: difficulty with speech, weakness, numbness, ataxia    SUBJECTIVE:   VITAL SIGNS: Temp:  [97.9 F (36.6 C)-99 F (37.2 C)] 99 F (37.2 C) (10/20 1713) Pulse Rate:  [88-96] 88 (10/20 1713) Resp:  [15-27] 20 (10/20 1713) BP: (71-88)/(34-59) 88/50 mmHg (10/20 1713) SpO2:  [95 %-100 %] 99 % (10/20 1713) Weight:  [68.266 kg (150 lb 8  oz)] 68.266 kg (150 lb 8 oz) (10/20 1500)  PHYSICAL EXAMINATION: General:  Elderly female in NAD Neuro:  Alert, oriented to self HEENT:  [place]/AT, no JVD, PERRL Cardiovascular:  RRR, no MRG Lungs:  Clear bilateral breath sounds Abdomen:  Soft, non-tender, non-distended Musculoskeletal:  No acute deformity or ROM limitaion Skin:  Grossly intact   Recent Labs Lab 06/21/15 1440  NA 135  K 4.1  CL 104  CO2 22  BUN 33*  CREATININE 2.27*  GLUCOSE 119*    Recent Labs Lab 06/21/15 1440  HGB 5.1*  HCT 17.7*  WBC 10.6*  PLT 422*   Dg Chest 2 View  06/21/2015  CLINICAL DATA:  Chest pain. EXAM: CHEST  2 VIEW COMPARISON:  04/24/2015 FINDINGS: Mild patient rotation right. Cardiomegaly accentuated by AP portable technique. Tortuous thoracic aorta. No pleural effusion or pneumothorax. Pulmonary interstitial prominence and indistinctness is slightly  increased. No lobar consolidation. IMPRESSION: Cardiomegaly with low lung volumes and mild interstitial edema. Electronically Signed   By: Abigail Miyamoto M.D.   On: 06/21/2015 16:13    ASSESSMENT / PLAN:  Hemorrhagic shock (improving) > likely etiology is acute anemia in setting GI bleeding, however, hemoccult test negative NSTEMI in setting anemia  Recommendation:  - admit to stepdown unit under hospitalist team. - would transfuse 3 units PRBC with hemoglobin goal 8.5 - Continue IVF resuscitation as well - Protonix gtt - Would consult GI, followed by Dr. Cristina Gong Sadie Haber)  - Check peripheral blood smear, reticulocytes    Georgann Housekeeper, AGACNP-BC [facility] Pulmonology/Critical Care Pager [phone] or [phone]  06/21/2015 5:27 PM   STAFF NOTE: Linwood Dibbles, MD FACP have personally reviewed patient's available data, including medical history, events of note, physical examination and test results as part of my evaluation. I have discussed with resident/NP and other care providers such as pharmacist, RN and RRT. In addition, I personally evaluated patient and elicited key findings of: No distress, awake, alert, bseline dementia, no active chest pain, MAP 60, HR not tachy, has Sys murmur, concerning for Aortic stenosis raDIATING TO CAROTIDS, she presents with soft abdomen, anemia, likely GI bleed but hemo occult neg thus far, but has had upper gi bleeding, she also has received over 2 liters fluid that will dilute further her hgb, ecg without ST changed, has trop leak and likely ischemia from severe anemia contribution, wold kvo, transfuse 3 units prbc now, re assess seriel cbc, add ppi drip with prior gastric ulcer, call Dr Carma Leaven [facility] who friend reports has taken care of her in past, does NOT require ICU care, no repeat lactic required, consider repeat hemoccult, if neg again without bleeding, may need retic, ldh and [eriph smear, bili fractionated and hapto, likely this is GI  bleeding, also low threshold to place NGT and lavage, see my other note with discussions with social services AND the clear wishes from the pt told to her prior med poa in past, will sign off, call if needed   Lavon Paganini. Titus Mould, MD, [place] Pgr: [facility] 06/21/2015 8:23 PM

## 2015-06-21 NOTE — Progress Notes (Signed)
Patient ID: Jamie [name], female   DOB: June 15, 1930, 81 y.o.   MRN: [id number]  See consult note Fortunately [name] likely will improve with blood but concerns for ischemia from anemia. I had an extensive d/w [name] long time friend and recent med poa. She described clear wished (when she had capacity) from the pt to never have heroic measures under any circumstances. The state now has guardianship. I attempted twice to call state to discuss dnr status. It is clear to me and her pt wishes to NOT have any form of life support, this would be medically ineffective and futile and only allow her to suffer. Will place DNR order and avoid any life support forms. We will recommend medical management, see note  Lavon Paganini. Titus Mould, MD, [place] Pgr: [facility]

## 2015-06-21 NOTE — Progress Notes (Signed)
Pt arrived on unit at 1845. Pt calm and cooperative. Pt verbalizes no concerns. Given chlorhexidine bath and vitals stable. Nose swapped. Pt given education regarding bed alarm, call bell and safety.

## 2015-06-21 NOTE — ED Notes (Addendum)
Pt arrives via EMS from [facility] with hx of dementia. Told staff she had chest pain. On arrival, fire dept, pt c/o left shoulder pain. For EMS c/o generalized weakness. Pt alert, oriented x3, appropriate for baseline per staff. EMS vitals 68/40, HR 110. CBG 228, 18g LAC, 324mg  ASA PTA.

## 2015-06-21 NOTE — ED Notes (Signed)
Reported critical Troponin to Dr Johnney Killian.

## 2015-06-21 NOTE — ED Notes (Signed)
Spoke with main lab, states that they will add on a BNP and PT/PTT/INR

## 2015-06-21 NOTE — Progress Notes (Signed)
Unit CM UR Completed by [facility] ED CM  W. Alandis Bluemel RN  

## 2015-06-21 NOTE — ED Provider Notes (Signed)
CSN: [id number]     Arrival date & time 06/21/15  1427 History   First MD Initiated Contact with Patient 06/21/15 1429     Chief Complaint  Patient presents with  . Chest Pain  . Hypotension     (Consider location/radiation/quality/duration/timing/severity/associated sxs/prior Treatment) HPI  History is provided by EMS.  History is limited due to patient's dementia.  Jamie [name] is a 81 y.o. female with PMH significant for HLD, HTN, anxiety, depression, gastric ulcer and dementia who presents via EMS from [place].  Per EMS, she told staff she had chest pain, and upon fire dept arrival she was complaining of left shoulder pain.  Upon questioning from EMS she complained of generalized weakness.  Pt alert and oriented x 1, baseline.  EMS vitals 68/40 with HR 110.  Patient has no complaints at this time.  Denies headache, CP, cough, SOB, abdominal pain, N/V, or urinary symptoms.   Past Medical History  Diagnosis Date  . Hyperlipidemia   . Hypertension   . Obesity     5'3"  . Gastric ulcer 05/2011  . Chronic pain   . Anxiety   . Depression    Past Surgical History  Procedure Laterality Date  . Bilateral oophorectomy    . Skin biopsy      nose lesion, noncancerous  . Esophagogastroduodenoscopy  08/12/2012    Procedure: ESOPHAGOGASTRODUODENOSCOPY (EGD);  Surgeon: Arta Silence, MD;  Location: [facility];  Service: Endoscopy;  Laterality: Left;  . Abdominal hysterectomy      PARTIAL  . Esophagogastroduodenoscopy (egd) with propofol N/A 12/28/2014    Procedure: ESOPHAGOGASTRODUODENOSCOPY (EGD) WITH PROPOFOL;  Surgeon: Wilford Corner, MD;  Location: [facility] ENDOSCOPY;  Service: Endoscopy;  Laterality: N/A;   Family History  Problem Relation Age of Onset  . Hypertension Mother   . Hyperlipidemia Mother   . Heart disease Father     5s  . Cancer Brother    Social History  Substance Use Topics  . Smoking status: Former Smoker -- 1.00 packs/day    Types: Cigarettes    Quit  date: 09/02/1971  . Smokeless tobacco: Never Used  . Alcohol Use: Yes     Comment: occasionally   OB History    No data available     Review of Systems All other systems negative unless otherwise stated in HPI    Allergies  Pollen extract  Home Medications   Prior to Admission medications   Medication Sig Start Date End Date Taking? Authorizing Provider  ARIPiprazole (ABILIFY) 5 MG tablet Take 5 mg by mouth at bedtime.   Yes Historical Provider, MD  aspirin EC 81 MG tablet Take 1 tablet (81 mg total) by mouth daily with breakfast. Resume in 2 weeks. 01/15/15  Yes Eugenie Filler, MD  atorvastatin (LIPITOR) 20 MG tablet Take 20 mg by mouth at bedtime.   Yes Historical Provider, MD  Cholecalciferol (VITAMIN D) 2000 UNITS tablet Take 2,000 Units by mouth daily.   Yes Historical Provider, MD  fluticasone (FLONASE) 50 MCG/ACT nasal spray Place 2 sprays into both nostrils daily.   Yes Historical Provider, MD  furosemide (LASIX) 40 MG tablet Take 1 tablet (40 mg total) by mouth daily. 04/25/15  Yes Donne Hazel, MD  gabapentin (NEURONTIN) 100 MG capsule Take 100 mg by mouth at bedtime.   Yes Historical Provider, MD  Hypromellose (ISOPTO TEARS) 0.5 % SOLN Apply 1 drop to eye 3 (three) times daily. One drop in each eye, three times daily.  Yes Historical Provider, MD  lisinopril (PRINIVIL,ZESTRIL) 20 MG tablet Take 20 mg by mouth daily.   Yes Historical Provider, MD  loratadine (CLARITIN) 10 MG tablet Take 10 mg by mouth daily.   Yes Historical Provider, MD  Melatonin 1 MG TABS Take 1 tablet by mouth at bedtime.   Yes Historical Provider, MD  Multiple Vitamins-Minerals (THERA-TABS M) TABS Take 1 tablet by mouth daily with breakfast.   Yes Historical Provider, MD  olopatadine (PATANOL) 0.1 % ophthalmic solution Place 1 drop into both eyes 2 (two) times daily.   Yes Historical Provider, MD  pantoprazole (PROTONIX) 40 MG tablet Take 1 tablet (40 mg total) by mouth 2 (two) times daily before  a meal. Take 1 tablet 2 times daily x 4 weeks, then 1 tablet daily. Patient taking differently: Take 40 mg by mouth 2 (two) times daily before a meal.  01/01/15  Yes Eugenie Filler, MD  polyethylene glycol (MIRALAX / GLYCOLAX) packet Take 17 g by mouth daily.   Yes Historical Provider, MD  potassium chloride (K-DUR) 10 MEQ tablet Take 2 tablets (20 mEq total) by mouth daily. 04/25/15  Yes Donne Hazel, MD  risperiDONE (RISPERDAL) 0.5 MG tablet Take 0.5-1 mg by mouth 2 (two) times daily. 1 tab in the morning and 2 tabs at bedtime   Yes Historical Provider, MD  risperiDONE (RISPERDAL) 1 MG tablet Take 1 tablet (1 mg total) by mouth at bedtime. 08/14/14  Yes Kennyth Arnold, FNP  senna (SENOKOT) 8.6 MG TABS tablet Take 1 tablet (8.6 mg total) by mouth at bedtime. 01/01/15  Yes Eugenie Filler, MD  sertraline (ZOLOFT) 50 MG tablet Take 50 mg by mouth daily.   Yes Historical Provider, MD  vitamin B-12 (CYANOCOBALAMIN) 500 MCG tablet Take 1,000 mcg by mouth daily.    Yes Historical Provider, MD  benzonatate (TESSALON) 100 MG capsule Take 1 capsule (100 mg total) by mouth 3 (three) times daily as needed for cough. Patient not taking: Reported on 06/21/2015 04/24/15   Donne Hazel, MD  hydroxypropyl methylcellulose / hypromellose (ISOPTO TEARS / GONIOVISC) 2.5 % ophthalmic solution Place 1 drop into both eyes every 2 (two) hours as needed for dry eyes.    Historical Provider, MD  ipratropium-albuterol (DUONEB) 0.5-2.5 (3) MG/3ML SOLN Take 3 mLs by nebulization every 4 (four) hours as needed (wheezing, sob). Patient not taking: Reported on 06/21/2015 04/24/15   Donne Hazel, MD  predniSONE (DELTASONE) 5 MG tablet Take 1 tablet (5 mg total) by mouth daily with breakfast. Patient not taking: Reported on 06/21/2015 04/24/15   Donne Hazel, MD   BP 76/37 mmHg  Pulse 96  Temp(Src) 97.9 F (36.6 C) (Rectal)  Resp 16  Wt 150 lb 8 oz (68.266 kg)  SpO2 100% Physical Exam  Constitutional: She appears  well-developed and well-nourished.  HENT:  Head: Normocephalic and atraumatic.  Mouth/Throat: Oropharynx is clear and moist.  Eyes: Conjunctivae are normal. Pupils are equal, round, and reactive to light.  Cardiovascular: Normal rate, regular rhythm and intact distal pulses.   Murmur heard. Pulmonary/Chest: Effort normal and breath sounds normal. No accessory muscle usage or stridor. No respiratory distress. She has no wheezes. She has no rhonchi. She has no rales.  Abdominal: Soft. Bowel sounds are normal. She exhibits no distension. There is no tenderness.  Musculoskeletal: Normal range of motion.  Lymphadenopathy:    She has no cervical adenopathy.  Neurological: She is alert.  Speech clear without dysarthria.  Patient  is oriented to person, place, and time (season).  Cranial nerves grossly intact. Sensation and strength intact bilaterally throughout.    Skin: Skin is warm. She is diaphoretic.  Psychiatric: She has a normal mood and affect. Her behavior is normal.  Nursing note and vitals reviewed.   ED Course  Procedures (including critical care time) CRITICAL CARE Performed by: Gloriann Loan   Total critical care time: 30  Critical care time was exclusive of separately billable procedures and treating other patients.  Critical care was necessary to treat or prevent imminent or life-threatening deterioration.  Critical care was time spent personally by me on the following activities: development of treatment plan with patient and/or surrogate as well as nursing, discussions with consultants, evaluation of patient's response to treatment, examination of patient, obtaining history from patient or surrogate, ordering and performing treatments and interventions, ordering and review of laboratory studies, ordering and review of radiographic studies, pulse oximetry and re-evaluation of patient's condition.  Labs Review Labs Reviewed  COMPREHENSIVE METABOLIC PANEL - Abnormal; Notable for  the following:    Glucose, Bld 119 (*)    BUN 33 (*)    Creatinine, Ser 2.27 (*)    Total Protein 5.6 (*)    Albumin 3.1 (*)    AST 55 (*)    GFR calc non Af Amer 19 (*)    GFR calc Af Amer 22 (*)    All other components within normal limits  CBC WITH DIFFERENTIAL/PLATELET - Abnormal; Notable for the following:    WBC 10.6 (*)    RBC 2.37 (*)    Hemoglobin 5.1 (*)    HCT 17.7 (*)    MCV 74.7 (*)    MCH 21.5 (*)    MCHC 28.8 (*)    RDW 21.4 (*)    Platelets 422 (*)    Neutro Abs 8.2 (*)    All other components within normal limits  TROPONIN I - Abnormal; Notable for the following:    Troponin I 6.30 (*)    All other components within normal limits  LIPASE, BLOOD - Abnormal; Notable for the following:    Lipase 53 (*)    All other components within normal limits  I-STAT CG4 LACTIC ACID, ED - Abnormal; Notable for the following:    Lactic Acid, Venous 3.78 (*)    All other components within normal limits  I-STAT TROPOININ, ED - Abnormal; Notable for the following:    Troponin i, poc 5.23 (*)    All other components within normal limits  CULTURE, BLOOD (ROUTINE X 2)  CULTURE, BLOOD (ROUTINE X 2)  URINE CULTURE  PROTIME-INR  APTT  URINALYSIS, ROUTINE W REFLEX MICROSCOPIC (NOT AT [facility])  BRAIN NATRIURETIC PEPTIDE  POC OCCULT BLOOD, ED  TYPE AND SCREEN  PREPARE RBC (CROSSMATCH)    Imaging Review Dg Chest 2 View  06/21/2015  CLINICAL DATA:  Chest pain. EXAM: CHEST  2 VIEW COMPARISON:  04/24/2015 FINDINGS: Mild patient rotation right. Cardiomegaly accentuated by AP portable technique. Tortuous thoracic aorta. No pleural effusion or pneumothorax. Pulmonary interstitial prominence and indistinctness is slightly increased. No lobar consolidation. IMPRESSION: Cardiomegaly with low lung volumes and mild interstitial edema. Electronically Signed   By: Abigail Miyamoto M.D.   On: 06/21/2015 16:13   I have personally reviewed and evaluated these images and lab results as part of my  medical decision-making.   EKG Interpretation   Date/Time:  Thursday June 21 2015 14:36:49 EDT Ventricular Rate:  98 PR Interval:  176  QRS Duration: 80 QT Interval:  343 QTC Calculation: 438 R Axis:   19 Text Interpretation:  Sinus tachycardia Multiple ventricular premature  complexes Probable left atrial enlargement Probable LVH with secondary  repol abnrm ischemic ST depression II and coving V1. no STEMI Confirmed by  Johnney Killian, MD, Jeannie Done [phone]) on 06/21/2015 3:16:06 PM      MDM   Final diagnoses:  Hypovolemia  NSTEMI (non-ST elevated myocardial infarction) ([place])  Anemia associated with acute blood loss    Patient [facility] from [facility] with chest pain. No complaints at this time.  Patient is hypotensive 76/37 and tachycardic.  Afebrile, rectal temperature 97.9.  Will start IVF. No focal neurological deficits.  Abdomen is soft, non-tender.  No rebound or guarding.  Lungs CTAB.  Heart RRR.  Will obtain lipase, CMP, CBC, UA, urine culture, blood culture, lactice acid, troponin, and CXR.  EKG shows sinus tachycardia with PVCs.  Lactic acid 3.78.  Troponin 5.23.  Hgb 5.1.  Type and screen ordered for transfusion. Suspect myocardial ischemia secondary anemia and hypovolemia. Will give 3 units PRBCs.  DRE showed no BRBPR or melena. CXR shows cardiomegaly with low lung volumes and mild interstitial edema. CBC shows slight leukocytosis of 10.6.  CMP shows elevated creatinine of 2.27 from previous 1.13 (1 month ago).  Discussed with critical care, Dr. Oletta Darter, and they will see patient for admission.   Case has been discussed with and seen by Dr. Johnney Killian who agrees with the above plan for admission.     Gloriann Loan, PA-C 06/21/15 1621  Charlesetta Shanks, MD 06/23/15 [phone]

## 2015-06-21 NOTE — H&P (Addendum)
History and Physical        Hospital Admission Note Date: 06/21/2015  Patient name: Jamie [name] Medical record number: [id number] Date of birth: Feb 05, 1930 Age: 81 y.o. Gender: female  PCP: Kennyth Arnold, FNP  Referring physician:  Dr Billy Fischer  Chief Complaint:  Chest pain  HPI: Patient is a 81 year old female with hypertension, hyperlipidemia, anxiety, depression, advanced dementia, SNF resident  (memory care dementia unit), peptic ulcer disease presented to ED after she reported chest pain today. Patient has advanced dementia and is unable to provide any history. History was obtained from the patient's long-term friend, prior HPOA who stated that she was called by the facility today that patient was having chest pain this morning. She had visited the patient yesterday and she noticed her to be pale and very weak. At the time of my encounter, patient denies any chest pain, appears comfortable but unable to provide any history. Per EMS, she was complaining of left shoulder pain and weakness. Vital signs per EMS was 68/40 with heart rate of 110. No reports of abdominal pain, hematemesis hematochezia or melena. She has a prior history of gastric ulcer (GI Dr Cristina Gong). Prior EGD 4/16 (by Dr. Michail Sermon) had shown bleeding duodenal AVM s/p APC.  ER workup showed today a CBC with white count of 10.6, hemoglobin 5.1, hematocrit 17.7, BNP 2094, troponin 6.3, BUN 33, creatinine 2.27, AST 55. Fecal occult blood test negative. CC M was consulted in the ED due to persistent hypotension, patient was seen by Dr. Titus Mould, recommended medical management, patient is DNR/DNI.   Review of Systems:  Unable to obtain from the patient due to her mental status, dementia  Past Medical History: Past Medical History  Diagnosis Date  . Hyperlipidemia   . Hypertension   . Obesity     5'3"  . Gastric  ulcer 05/2011  . Chronic pain   . Anxiety   . Depression     Past Surgical History  Procedure Laterality Date  . Bilateral oophorectomy    . Skin biopsy      nose lesion, noncancerous  . Esophagogastroduodenoscopy  08/12/2012    Procedure: ESOPHAGOGASTRODUODENOSCOPY (EGD);  Surgeon: Arta Silence, MD;  Location: [facility];  Service: Endoscopy;  Laterality: Left;  . Abdominal hysterectomy      PARTIAL  . Esophagogastroduodenoscopy (egd) with propofol N/A 12/28/2014    Procedure: ESOPHAGOGASTRODUODENOSCOPY (EGD) WITH PROPOFOL;  Surgeon: Wilford Corner, MD;  Location: [facility];  Service: Endoscopy;  Laterality: N/A;    Medications: Prior to Admission medications   Medication Sig Start Date End Date Taking? Authorizing Provider  ARIPiprazole (ABILIFY) 5 MG tablet Take 5 mg by mouth at bedtime.   Yes Historical Provider, MD  aspirin EC 81 MG tablet Take 1 tablet (81 mg total) by mouth daily with breakfast. Resume in 2 weeks. 01/15/15  Yes Eugenie Filler, MD  atorvastatin (LIPITOR) 20 MG tablet Take 20 mg by mouth at bedtime.   Yes Historical Provider, MD  Cholecalciferol (VITAMIN D) 2000 UNITS tablet Take 2,000 Units by mouth daily.   Yes Historical Provider, MD  fluticasone (FLONASE) 50 MCG/ACT nasal spray Place 2 sprays into both nostrils daily.  Yes Historical Provider, MD  furosemide (LASIX) 40 MG tablet Take 1 tablet (40 mg total) by mouth daily. 04/25/15  Yes Donne Hazel, MD  gabapentin (NEURONTIN) 100 MG capsule Take 100 mg by mouth at bedtime.   Yes Historical Provider, MD  Hypromellose (ISOPTO TEARS) 0.5 % SOLN Apply 1 drop to eye 3 (three) times daily. One drop in each eye, three times daily.   Yes Historical Provider, MD  lisinopril (PRINIVIL,ZESTRIL) 20 MG tablet Take 20 mg by mouth daily.   Yes Historical Provider, MD  loratadine (CLARITIN) 10 MG tablet Take 10 mg by mouth daily.   Yes Historical Provider, MD  Melatonin 1 MG TABS Take 1 tablet by mouth at bedtime.    Yes Historical Provider, MD  Multiple Vitamins-Minerals (THERA-TABS M) TABS Take 1 tablet by mouth daily with breakfast.   Yes Historical Provider, MD  olopatadine (PATANOL) 0.1 % ophthalmic solution Place 1 drop into both eyes 2 (two) times daily.   Yes Historical Provider, MD  pantoprazole (PROTONIX) 40 MG tablet Take 1 tablet (40 mg total) by mouth 2 (two) times daily before a meal. Take 1 tablet 2 times daily x 4 weeks, then 1 tablet daily. Patient taking differently: Take 40 mg by mouth 2 (two) times daily before a meal.  01/01/15  Yes Eugenie Filler, MD  polyethylene glycol (MIRALAX / GLYCOLAX) packet Take 17 g by mouth daily.   Yes Historical Provider, MD  potassium chloride (K-DUR) 10 MEQ tablet Take 2 tablets (20 mEq total) by mouth daily. 04/25/15  Yes Donne Hazel, MD  risperiDONE (RISPERDAL) 0.5 MG tablet Take 0.5-1 mg by mouth 2 (two) times daily. 1 tab in the morning and 2 tabs at bedtime   Yes Historical Provider, MD  risperiDONE (RISPERDAL) 1 MG tablet Take 1 tablet (1 mg total) by mouth at bedtime. 08/14/14  Yes Kennyth Arnold, FNP  senna (SENOKOT) 8.6 MG TABS tablet Take 1 tablet (8.6 mg total) by mouth at bedtime. 01/01/15  Yes Eugenie Filler, MD  sertraline (ZOLOFT) 50 MG tablet Take 50 mg by mouth daily.   Yes Historical Provider, MD  vitamin B-12 (CYANOCOBALAMIN) 500 MCG tablet Take 1,000 mcg by mouth daily.    Yes Historical Provider, MD  benzonatate (TESSALON) 100 MG capsule Take 1 capsule (100 mg total) by mouth 3 (three) times daily as needed for cough. Patient not taking: Reported on 06/21/2015 04/24/15   Donne Hazel, MD  hydroxypropyl methylcellulose / hypromellose (ISOPTO TEARS / GONIOVISC) 2.5 % ophthalmic solution Place 1 drop into both eyes every 2 (two) hours as needed for dry eyes.    Historical Provider, MD  ipratropium-albuterol (DUONEB) 0.5-2.5 (3) MG/3ML SOLN Take 3 mLs by nebulization every 4 (four) hours as needed (wheezing, sob). Patient not taking:  Reported on 06/21/2015 04/24/15   Donne Hazel, MD  predniSONE (DELTASONE) 5 MG tablet Take 1 tablet (5 mg total) by mouth daily with breakfast. Patient not taking: Reported on 06/21/2015 04/24/15   Donne Hazel, MD    Allergies:   Allergies  Allergen Reactions  . Pollen Extract Other (See Comments)    Swollen Eyes & Runny Nose.    Social History:  Per records, reports that she quit smoking about 43 years ago. Her smoking use included Cigarettes. She smoked 1.00 pack per day. She has never used smokeless tobacco. She reports that she drinks alcohol. She reports that she does not use illicit drugs. patient is a resident of  skilled nursing facility, dementia unit  Family History: Family History  Problem Relation Age of Onset  . Hypertension Mother   . Hyperlipidemia Mother   . Heart disease Father     64s  . Cancer Brother     Physical Exam: Blood pressure 88/50, pulse 88, temperature 99 F (37.2 C), temperature source Oral, resp. rate 20, weight 68.266 kg (150 lb 8 oz), SpO2 99 %. General: Alert, awake, oriented x1/self, in no acute distress. HEENT: normocephalic, atraumatic, anicteric sclera, pink conjunctiva, pupils equal and reactive to light and accomodation, oropharynx clear Neck: supple, no masses or lymphadenopathy, no goiter, no bruits  Heart: Regular rate and rhythm, without murmurs, rubs or gallops. Lungs: Clear to auscultation bilaterally, no wheezing, rales or rhonchi. Abdomen: Soft, nontender, nondistended, positive bowel sounds, no masses. Extremities: No clubbing, cyanosis or edema with positive pedal pulses. Neuro: Grossly intact, no focal neurological deficits, strength 5/5 upper and lower extremities bilaterally Psych: alert and oriented x 3, normal mood and affect Skin: no rashes or lesions, warm and dry   LABS on Admission:  Basic Metabolic Panel:  Recent Labs Lab 06/21/15 1440  NA 135  K 4.1  CL 104  CO2 22  GLUCOSE 119*  BUN 33*  CREATININE  2.27*  CALCIUM 9.7   Liver Function Tests:  Recent Labs Lab 06/21/15 1440  AST 55*  ALT 17  ALKPHOS 53  BILITOT 0.4  PROT 5.6*  ALBUMIN 3.1*    Recent Labs Lab 06/21/15 1440  LIPASE 53*   No results for input(s): AMMONIA in the last 168 hours. CBC:  Recent Labs Lab 06/21/15 1440  WBC 10.6*  NEUTROABS 8.2*  HGB 5.1*  HCT 17.7*  MCV 74.7*  PLT 422*   Cardiac Enzymes:  Recent Labs Lab 06/21/15 1440  TROPONINI 6.30*   BNP: Invalid input(s): POCBNP CBG: No results for input(s): GLUCAP in the last 168 hours.  Radiological Exams on Admission:  Dg Chest 2 View  06/21/2015  CLINICAL DATA:  Chest pain. EXAM: CHEST  2 VIEW COMPARISON:  04/24/2015 FINDINGS: Mild patient rotation right. Cardiomegaly accentuated by AP portable technique. Tortuous thoracic aorta. No pleural effusion or pneumothorax. Pulmonary interstitial prominence and indistinctness is slightly increased. No lobar consolidation. IMPRESSION: Cardiomegaly with low lung volumes and mild interstitial edema. Electronically Signed   By: Abigail Miyamoto M.D.   On: 06/21/2015 16:13    *I have personally reviewed the images above*  EKG: Independently reviewed. Rate 98, normal sinus rhythm, repolarization changes   Assessment/Plan Principal Problem:   Hypotension with severe anemia  - Admit to stepdown unit, placed on 3 units packed RBC transfusion, patient's BP has been improving, at the time of my encounter 96/54 - Hold off on any fluids, patient has a history of grade 1 diastolic dysfunction, with elevated BNP 2094 - Discussed with long-term friend who had been has power of attorney, patient has DNR/DNI, does not want any vasopressors, focus more on comfort than any aggressive or invasive measures.   Active Problems:   Elevated troponin/ NSTEMI, chest pain: Echo 04/19/15 EF 16-96%, grade 1 diastolic dysfunction likely due to demand ischemia from severe anemia - Currently patient is not having any chest pain  or shortness of breath - Cannot have aspirin or heparin due to severe anemia of possible GI bleeding, cannot be on any beta blockers or ACE inhibitor due to hypotension. Continue statins - Obtain serial cardiac enzymes, 2-D echocardiogram  -  discussed with on-call cardiology, Dr. Acie Fredrickson, recommended medical/conservative  management in the light of demand ischemia from severe anemia, not a candidate for aggressive invasive interventions or cardiac cath secondary to severe anemia or GI bleeding.   Acute on chronic anemia Baseline hemoglobin 9-10 - Patient has a history of peptic ulcer disease, duodenal AVMs, prior endoscopy in 12/2014 By Dr. Michail Sermon which had shown bleeding duodenal AVM - I have requested for GI consult, called [facility] - Placed on IV PPI, clear liquid diet, no obvious gross GI bleeding. Discontinue aspirin.  - 3 units packed RBC transfusions ordered - FOBT is negative, ordered CT abdomen to rule out any intra-abdominal pathology     Dementia with behavioral disturbance - Continue Abilify, risperidone, Zoloft    Acute-on-chronic kidney injury ([place]): Baseline creatinine 1.0-1.1 - Patient presented with creatinine of 2.2 likely due to severe anemia and hypotension causing hypoperfusion - will assess repeat BMET after packed RBC transfusion - Patient has also received 2 L IV fluids prior to the packed RBC transfusion.   DVT prophylaxis: SCDs   CODE STATUS: DNR/DNI   Family Communication: Admission, patients condition and plan of care including tests being ordered have been discussed with the patient's long-term friend and prior POA  who indicates understanding and agree with the plan and Code Status  Disposition plan: Further plan will depend as patient's clinical course evolves and further radiologic and laboratory data become available. Likelback to skilled nursing facility when medically ready   Time Spent on Admission: 34 minutes   Arion Shankles M.D. [facility] 06/21/2015, 5:45 PM Pager: [phone]  If 7PM-7AM, please contact night-coverage [email or web address] Password [email or web address]

## 2015-06-22 ENCOUNTER — Other Ambulatory Visit (HOSPITAL_COMMUNITY): Payer: Self-pay

## 2015-06-22 ENCOUNTER — Inpatient Hospital Stay (HOSPITAL_COMMUNITY): Payer: Medicare Other

## 2015-06-22 ENCOUNTER — Encounter (HOSPITAL_COMMUNITY): Payer: Self-pay

## 2015-06-22 DIAGNOSIS — R079 Chest pain, unspecified: Secondary | ICD-10-CM

## 2015-06-22 DIAGNOSIS — I350 Nonrheumatic aortic (valve) stenosis: Secondary | ICD-10-CM

## 2015-06-22 LAB — HEMOGLOBIN AND HEMATOCRIT, BLOOD
HCT: 29.4 % — ABNORMAL LOW (ref 36.0–46.0)
HEMATOCRIT: 29.7 % — AB (ref 36.0–46.0)
HEMATOCRIT: 29.6 % — AB (ref 36.0–46.0)
HEMOGLOBIN: 9.1 g/dL — AB (ref 12.0–15.0)
Hemoglobin: 9.4 g/dL — ABNORMAL LOW (ref 12.0–15.0)
Hemoglobin: 9.3 g/dL — ABNORMAL LOW (ref 12.0–15.0)

## 2015-06-22 LAB — CBC
HEMATOCRIT: 26.6 % — AB (ref 36.0–46.0)
Hemoglobin: 8.4 g/dL — ABNORMAL LOW (ref 12.0–15.0)
MCH: 24.6 pg — ABNORMAL LOW (ref 26.0–34.0)
MCHC: 31.6 g/dL (ref 30.0–36.0)
MCV: 78 fL (ref 78.0–100.0)
PLATELETS: 354 10*3/uL (ref 150–400)
RBC: 3.41 MIL/uL — ABNORMAL LOW (ref 3.87–5.11)
RDW: 18.6 % — AB (ref 11.5–15.5)
WBC: 9.6 10*3/uL (ref 4.0–10.5)

## 2015-06-22 LAB — BASIC METABOLIC PANEL
Anion gap: 10 (ref 5–15)
BUN: 28 mg/dL — AB (ref 6–20)
CALCIUM: 9.1 mg/dL (ref 8.9–10.3)
CO2: 22 mmol/L (ref 22–32)
CREATININE: 1.8 mg/dL — AB (ref 0.44–1.00)
Chloride: 103 mmol/L (ref 101–111)
GFR calc Af Amer: 29 mL/min — ABNORMAL LOW (ref >60)
GFR, EST NON AFRICAN AMERICAN: 25 mL/min — AB (ref >60)
GLUCOSE: 94 mg/dL (ref 65–99)
POTASSIUM: 3.7 mmol/L (ref 3.5–5.1)
SODIUM: 135 mmol/L (ref 135–145)

## 2015-06-22 LAB — URINE CULTURE: CULTURE: NO GROWTH

## 2015-06-22 LAB — TROPONIN I
TROPONIN I: 6.92 ng/mL — AB (ref <0.031)
Troponin I: 7.67 ng/mL (ref <0.031)

## 2015-06-22 NOTE — Consult Note (Signed)
Referring Provider: Dr. Tana Coast Primary Care Physician:  Kennyth Arnold, FNP Primary Gastroenterologist:  Dr. Cristina Gong  Reason for Consultation:  Anemia  HPI: Jamie [name] is a 81 y.o. female with history of AVMs (duodenal AVM ablation in April 2016) send over from a nursing home due to chest pain and weakness and found to have a Hgb 5.1. No report of melena or hematochezia. Heme negative. She has severe dementia and cannot give any history. Colonoscopy in 2012 with 2 polyps  removed and also showed diverticulosis. Her former neighbor who she reports used to by her HCPOA is by the bedside.    Past Medical History  Diagnosis Date  . Hyperlipidemia   . Hypertension   . Obesity     5'3"  . Gastric ulcer 05/2011  . Chronic pain   . Anxiety   . Depression     Past Surgical History  Procedure Laterality Date  . Bilateral oophorectomy    . Skin biopsy      nose lesion, noncancerous  . Esophagogastroduodenoscopy  08/12/2012    Procedure: ESOPHAGOGASTRODUODENOSCOPY (EGD);  Surgeon: Arta Silence, MD;  Location: [facility];  Service: Endoscopy;  Laterality: Left;  . Abdominal hysterectomy      PARTIAL  . Esophagogastroduodenoscopy (egd) with propofol N/A 12/28/2014    Procedure: ESOPHAGOGASTRODUODENOSCOPY (EGD) WITH PROPOFOL;  Surgeon: Wilford Corner, MD;  Location: [facility];  Service: Endoscopy;  Laterality: N/A;    Prior to Admission medications   Medication Sig Start Date End Date Taking? Authorizing Provider  ARIPiprazole (ABILIFY) 5 MG tablet Take 5 mg by mouth at bedtime.   Yes Historical Provider, MD  aspirin EC 81 MG tablet Take 1 tablet (81 mg total) by mouth daily with breakfast. Resume in 2 weeks. 01/15/15  Yes Eugenie Filler, MD  atorvastatin (LIPITOR) 20 MG tablet Take 20 mg by mouth at bedtime.   Yes Historical Provider, MD  Cholecalciferol (VITAMIN D) 2000 UNITS tablet Take 2,000 Units by mouth daily.   Yes Historical Provider, MD  fluticasone (FLONASE) 50 MCG/ACT  nasal spray Place 2 sprays into both nostrils daily.   Yes Historical Provider, MD  furosemide (LASIX) 40 MG tablet Take 1 tablet (40 mg total) by mouth daily. 04/25/15  Yes Donne Hazel, MD  gabapentin (NEURONTIN) 100 MG capsule Take 100 mg by mouth at bedtime.   Yes Historical Provider, MD  Hypromellose (ISOPTO TEARS) 0.5 % SOLN Apply 1 drop to eye 3 (three) times daily. One drop in each eye, three times daily.   Yes Historical Provider, MD  lisinopril (PRINIVIL,ZESTRIL) 20 MG tablet Take 20 mg by mouth daily.   Yes Historical Provider, MD  loratadine (CLARITIN) 10 MG tablet Take 10 mg by mouth daily.   Yes Historical Provider, MD  Melatonin 1 MG TABS Take 1 tablet by mouth at bedtime.   Yes Historical Provider, MD  Multiple Vitamins-Minerals (THERA-TABS M) TABS Take 1 tablet by mouth daily with breakfast.   Yes Historical Provider, MD  olopatadine (PATANOL) 0.1 % ophthalmic solution Place 1 drop into both eyes 2 (two) times daily.   Yes Historical Provider, MD  pantoprazole (PROTONIX) 40 MG tablet Take 1 tablet (40 mg total) by mouth 2 (two) times daily before a meal. Take 1 tablet 2 times daily x 4 weeks, then 1 tablet daily. Patient taking differently: Take 40 mg by mouth 2 (two) times daily before a meal.  01/01/15  Yes Eugenie Filler, MD  polyethylene glycol [facility] / Floria Raveling) packet  Take 17 g by mouth daily.   Yes Historical Provider, MD  potassium chloride (K-DUR) 10 MEQ tablet Take 2 tablets (20 mEq total) by mouth daily. 04/25/15  Yes Donne Hazel, MD  risperiDONE (RISPERDAL) 0.5 MG tablet Take 0.5-1 mg by mouth 2 (two) times daily. 1 tab in the morning and 2 tabs at bedtime   Yes Historical Provider, MD  risperiDONE (RISPERDAL) 1 MG tablet Take 1 tablet (1 mg total) by mouth at bedtime. 08/14/14  Yes Kennyth Arnold, FNP  senna (SENOKOT) 8.6 MG TABS tablet Take 1 tablet (8.6 mg total) by mouth at bedtime. 01/01/15  Yes Eugenie Filler, MD  sertraline (ZOLOFT) 50 MG tablet Take 50 mg  by mouth daily.   Yes Historical Provider, MD  vitamin B-12 (CYANOCOBALAMIN) 500 MCG tablet Take 1,000 mcg by mouth daily.    Yes Historical Provider, MD  benzonatate (TESSALON) 100 MG capsule Take 1 capsule (100 mg total) by mouth 3 (three) times daily as needed for cough. Patient not taking: Reported on 06/21/2015 04/24/15   Donne Hazel, MD  hydroxypropyl methylcellulose / hypromellose (ISOPTO TEARS / GONIOVISC) 2.5 % ophthalmic solution Place 1 drop into both eyes every 2 (two) hours as needed for dry eyes.    Historical Provider, MD  ipratropium-albuterol (DUONEB) 0.5-2.5 (3) MG/3ML SOLN Take 3 mLs by nebulization every 4 (four) hours as needed (wheezing, sob). Patient not taking: Reported on 06/21/2015 04/24/15   Donne Hazel, MD  predniSONE (DELTASONE) 5 MG tablet Take 1 tablet (5 mg total) by mouth daily with breakfast. Patient not taking: Reported on 06/21/2015 04/24/15   Donne Hazel, MD    Scheduled Meds: . ARIPiprazole  5 mg Oral QHS  . atorvastatin  20 mg Oral QHS  . cholecalciferol  2,000 Units Oral Daily  . cyanocobalamin  1,000 mcg Oral Daily  . fluticasone  2 spray Each Nare Daily  . gabapentin  100 mg Oral QHS  . loratadine  10 mg Oral Daily  . pantoprazole (PROTONIX) IV  80 mg Intravenous Once  . [START ON 06/25/2015] pantoprazole (PROTONIX) IV  40 mg Intravenous Q12H  . polyethylene glycol  17 g Oral Daily  . polyvinyl alcohol  1 drop Both Eyes TID  . risperiDONE  0.5 mg Oral Daily   And  . risperiDONE  1 mg Oral QHS  . senna  1 tablet Oral QHS  . sertraline  50 mg Oral Daily  . sodium chloride  3 mL Intravenous Q12H   Continuous Infusions: . pantoprozole (PROTONIX) infusion 8 mg/hr (06/22/15 0645)   PRN Meds:.acetaminophen **OR** acetaminophen, morphine injection, ondansetron **OR** ondansetron (ZOFRAN) IV  Allergies as of 06/21/2015 - Review Complete 06/21/2015  Allergen Reaction Noted  . Pollen extract Other (See Comments) 07/24/2014    Family History   Problem Relation Age of Onset  . Hypertension Mother   . Hyperlipidemia Mother   . Heart disease Father     27s  . Cancer Brother     Social History   Social History  . Marital Status: Divorced    Spouse Name: N/A  . Number of Children: N/A  . Years of Education: N/A   Occupational History  . Not on file.   Social History Main Topics  . Smoking status: Former Smoker -- 1.00 packs/day    Types: Cigarettes    Quit date: 09/02/1971  . Smokeless tobacco: Never Used  . Alcohol Use: Yes     Comment: occasionally  .  Drug Use: No  . Sexual Activity: Not Currently   Other Topics Concern  . Not on file   Social History Narrative   Lives in a townhouse with steps.  Lives alone.  Ambulates without assist device.  Drives, pays own bills, and completes ADLs without assistance.  Pt. Current/past profession-Concierge. Pt. Does exercise (walk) 1 X Day. Pt. Does have a living will. Pt. Do have a DNR form. And also Pt. Have signed POA/HPOA forms.      Neighbor, [name], is her emergency contact:  [phone]   Full code                   Review of Systems: Unable to obtain from patient (dementia)  Physical Exam: Vital signs: Filed Vitals:   06/22/15 0923  BP: 112/69  Pulse: 101  Temp: 97.9  Resp: 21     General:   Awake, demented, no acute distress Head: atraumatic Eyes: anicteric ENT: oropharynx clear Neck: supple, nontender Lungs:  Clear throughout to auscultation.   No wheezes, crackles, or rhonchi. No acute distress. Heart:  Regular rate and rhythm; no murmurs, clicks, rubs,  or gallops. Abdomen: soft, NTND+BS  Rectal:  Deferred Ext: pedal pulses intact, no edema Skin: no rash  GI:  Lab Results:  Recent Labs  06/21/15 1440 06/22/15 0214 06/22/15 0448 06/22/15 0912  WBC 10.6* 9.6  --   --   HGB 5.1* 8.4* 9.1* 9.4*  HCT 17.7* 26.6* 29.4* 29.7*  PLT 422* 354  --   --    BMET  Recent Labs  06/21/15 1440 06/22/15 0214  NA 135 135  K 4.1 3.7  CL 104  103  CO2 22 22  GLUCOSE 119* 94  BUN 33* 28*  CREATININE 2.27* 1.80*  CALCIUM 9.7 9.1   LFT  Recent Labs  06/21/15 1440  PROT 5.6*  ALBUMIN 3.1*  AST 55*  ALT 17  ALKPHOS 53  BILITOT 0.4   PT/INR  Recent Labs  06/21/15 1440  LABPROT 14.5  INR 1.11     Studies/Results: Ct Abdomen Pelvis Wo Contrast  06/22/2015  CLINICAL DATA:  81 year old female with history of anemia and generalize weakness. Evaluate for potential intra abdominal pathology. EXAM: CT ABDOMEN AND PELVIS WITHOUT CONTRAST TECHNIQUE: Multidetector CT imaging of the abdomen and pelvis was performed following the standard protocol without IV contrast. COMPARISON:  No priors. FINDINGS: Lower chest: Small bilateral layering pleural effusions with dependent areas of atelectasis. Mild interlobular septal thickening may suggest a background of mild interstitial pulmonary edema. Heart size is mildly enlarged. Severe calcifications of the aortic valve and mitral annulus. Large hiatal hernia. Hepatobiliary: 13 mm well-defined low-attenuation lesion in segment 4A of the liver is incompletely characterized on today's noncontrast CT examination, but is statistically likely to represent a cyst. No other suspicious hepatic lesions are noted on today's noncontrast CT examination. Unenhanced appearance of the gallbladder is normal. Pancreas: No pancreatic mass or peripancreatic inflammatory changes on today's noncontrast CT examination. Spleen: Unremarkable. Adrenals/Urinary Tract: Unenhanced appearance of bilateral adrenal glands is normal. Nonobstructive calculi are present within the collecting systems of the kidneys bilaterally, the largest of which is in the left renal pelvis measuring up to 11 mm. No ureteral or bladder calculi identified. No hydroureteronephrosis at this time. Bilateral kidneys are mildly atrophic. Unenhanced appearance of the kidneys bilaterally is otherwise unremarkable. Unenhanced appearance of the urinary  bladder is normal. Stomach/Bowel: Unenhanced appearance of the intra abdominal portion of the stomach is normal. No pathologic  dilatation of small bowel or colon. Numerous colonic diverticulae, particularly in the descending colon and sigmoid colon, without surrounding inflammatory changes to suggest an acute diverticulitis at this time. Normal appendix. Vascular/Lymphatic: Extensive atherosclerosis throughout the abdominal and pelvic vasculature, without definite aneurysm. No lymphadenopathy identified in the abdomen or pelvis on today's noncontrast CT examination. Reproductive: Unenhanced appearance of the uterus and ovaries is unremarkable. Other: No significant volume of ascites.  No pneumoperitoneum. Musculoskeletal: There are no aggressive appearing lytic or blastic lesions noted in the visualized portions of the skeleton. IMPRESSION: 1. No acute findings in the abdomen or pelvis. 2. Colonic diverticulosis without evidence of acute diverticulitis at this time. 3. Bilateral nonobstructive calculi in the collecting systems of the kidneys, largest of which measures 11 mm in the left renal pelvis. No ureteral stones or findings of urinary tract obstruction are noted at this time. 4. Extensive atherosclerosis. 5. Normal appendix. 6. Large hiatal hernia. 7. The appearance of the lung bases suggest mild interstitial pulmonary edema. There also small bilateral pleural effusions layering dependently. These findings may reflect mild congestive heart failure given the mild cardiomegaly. 8. There are calcifications of the aortic valve and mitral annulus. Echocardiographic correlation for evaluation of potential valvular dysfunction may be warranted if clinically indicated. Electronically Signed   By: Vinnie Langton M.D.   On: 06/22/2015 07:45   Dg Chest 2 View  06/21/2015  CLINICAL DATA:  Chest pain. EXAM: CHEST  2 VIEW COMPARISON:  04/24/2015 FINDINGS: Mild patient rotation right. Cardiomegaly accentuated by AP  portable technique. Tortuous thoracic aorta. No pleural effusion or pneumothorax. Pulmonary interstitial prominence and indistinctness is slightly increased. No lobar consolidation. IMPRESSION: Cardiomegaly with low lung volumes and mild interstitial edema. Electronically Signed   By: Abigail Miyamoto M.D.   On: 06/21/2015 16:13    Impression/Plan: 81 yo with severe dementia and history of AVMs with anemia without any overt bleeding. Elevated troponin and cardiology consult noted. S/P 3 U PRBCs and Hgb 9.4 (5.1). Would not recommend any invasive GI procedures. Would recommend conservative management. Follow H/Hs. Advance diet. Will sign off. Call if questions.    LOS: 1 day   Jasper C.  06/22/2015, 11:52 AM  Pager [phone]  If no answer or after 5 PM call [phone]

## 2015-06-22 NOTE — Consult Note (Signed)
CARDIOLOGY CONSULT NOTE   Patient ID: NAYSA PUSKAS MRN: [id number] DOB/AGE: [birth year] 81 y.o.  Admit Date: 06/21/2015 Referring Physician:  Primary Physician: Kennyth Arnold, [place]  Primary cardiologist None  Clinical Summary Ms. Kaelin is a 81 y.o.female. She has very severe dementia. She is DO NOT RESUSCITATE/DO NOT INTUBATE. She was brought to the hospital for the evaluation of chest discomfort. She had marked anemia. She was transfused. Associated with this there is a troponin elevation. There is no associated diagnostic EKG change.  An echo done in August, 2016 revealed that the patient does have significant aortic stenosis. There was some decrease in LV function at that time. Follow-up echo is being done today.   Allergies  Allergen Reactions  . Pollen Extract Other (See Comments)    Swollen Eyes & Runny Nose.    Medications Scheduled Medications: . ARIPiprazole  5 mg Oral QHS  . atorvastatin  20 mg Oral QHS  . cholecalciferol  2,000 Units Oral Daily  . cyanocobalamin  1,000 mcg Oral Daily  . fluticasone  2 spray Each Nare Daily  . gabapentin  100 mg Oral QHS  . loratadine  10 mg Oral Daily  . pantoprazole (PROTONIX) IV  80 mg Intravenous Once  . [START ON 06/25/2015] pantoprazole (PROTONIX) IV  40 mg Intravenous Q12H  . polyethylene glycol  17 g Oral Daily  . polyvinyl alcohol  1 drop Both Eyes TID  . risperiDONE  0.5 mg Oral Daily   And  . risperiDONE  1 mg Oral QHS  . senna  1 tablet Oral QHS  . sertraline  50 mg Oral Daily  . sodium chloride  3 mL Intravenous Q12H     Infusions: . pantoprozole (PROTONIX) infusion 8 mg/hr (06/22/15 0645)     PRN Medications:  acetaminophen **OR** acetaminophen, morphine injection, ondansetron **OR** ondansetron (ZOFRAN) IV   Past Medical History  Diagnosis Date  . Hyperlipidemia   . Hypertension   . Obesity     5'3"  . Gastric ulcer 05/2011  . Chronic pain   . Anxiety   . Depression     Past Surgical  History  Procedure Laterality Date  . Bilateral oophorectomy    . Skin biopsy      nose lesion, noncancerous  . Esophagogastroduodenoscopy  08/12/2012    Procedure: ESOPHAGOGASTRODUODENOSCOPY (EGD);  Surgeon: Arta Silence, MD;  Location: [facility];  Service: Endoscopy;  Laterality: Left;  . Abdominal hysterectomy      PARTIAL  . Esophagogastroduodenoscopy (egd) with propofol N/A 12/28/2014    Procedure: ESOPHAGOGASTRODUODENOSCOPY (EGD) WITH PROPOFOL;  Surgeon: Wilford Corner, MD;  Location: [facility];  Service: Endoscopy;  Laterality: N/A;    Family History  Problem Relation Age of Onset  . Hypertension Mother   . Hyperlipidemia Mother   . Heart disease Father     79s  . Cancer Brother     Social History Ms. Rames reports that she quit smoking about 43 years ago. Her smoking use included Cigarettes. She smoked 1.00 pack per day. She has never used smokeless tobacco. Ms. Dorgan reports that she drinks alcohol.  Review of Systems The patient is hard of hearing. In addition she has severe dementia. I am not able to take any type of meaningful review of systems. I spoke with the nurse. She also agrees that the patient's dementia limits any significant communication.  Physical Examination Blood pressure 112/69, pulse 101, temperature 97.9 F (36.6 C), temperature source Axillary, resp. rate 21, weight  150 lb 8 oz (68.266 kg), SpO2 92 %.  Intake/Output Summary (Last 24 hours) at 06/22/15 1113 Last data filed at 06/22/15 0928  Gross per 24 hour  Intake 1814.25 ml  Output   2445 ml  Net -630.75 ml    The patient is not oriented. Head is atraumatic. Sclera and conjunctiva are normal. There is no jugular venous distention. Lungs reveal a few scattered rhonchi. Cardiac exam reveals a crescendo decrescendo systolic murmur that is high-pitched. The abdomen is soft. There is no significant peripheral edema. There are no musculoskeletal deformities. There are no skin rashes.  Lab  Results  Basic Metabolic Panel:  Recent Labs Lab 06/21/15 1440 06/22/15 0214  NA 135 135  K 4.1 3.7  CL 104 103  CO2 22 22  GLUCOSE 119* 94  BUN 33* 28*  CREATININE 2.27* 1.80*  CALCIUM 9.7 9.1    Liver Function Tests:  Recent Labs Lab 06/21/15 1440  AST 55*  ALT 17  ALKPHOS 53  BILITOT 0.4  PROT 5.6*  ALBUMIN 3.1*    CBC:  Recent Labs Lab 06/21/15 1440 06/22/15 0214 06/22/15 0448 06/22/15 0912  WBC 10.6* 9.6  --   --   NEUTROABS 8.2*  --   --   --   HGB 5.1* 8.4* 9.1* 9.4*  HCT 17.7* 26.6* 29.4* 29.7*  MCV 74.7* 78.0  --   --   PLT 422* 354  --   --     Cardiac Enzymes:  Recent Labs Lab 06/21/15 1440 06/21/15 2001 06/22/15 0214 06/22/15 0653  TROPONINI 6.30* 8.22* 7.67* 6.92*    BNP: Invalid input(s): POCBNP   Radiology: Ct Abdomen Pelvis Wo Contrast  06/22/2015  CLINICAL DATA:  81 year old female with history of anemia and generalize weakness. Evaluate for potential intra abdominal pathology. EXAM: CT ABDOMEN AND PELVIS WITHOUT CONTRAST TECHNIQUE: Multidetector CT imaging of the abdomen and pelvis was performed following the standard protocol without IV contrast. COMPARISON:  No priors. FINDINGS: Lower chest: Small bilateral layering pleural effusions with dependent areas of atelectasis. Mild interlobular septal thickening may suggest a background of mild interstitial pulmonary edema. Heart size is mildly enlarged. Severe calcifications of the aortic valve and mitral annulus. Large hiatal hernia. Hepatobiliary: 13 mm well-defined low-attenuation lesion in segment 4A of the liver is incompletely characterized on today's noncontrast CT examination, but is statistically likely to represent a cyst. No other suspicious hepatic lesions are noted on today's noncontrast CT examination. Unenhanced appearance of the gallbladder is normal. Pancreas: No pancreatic mass or peripancreatic inflammatory changes on today's noncontrast CT examination. Spleen:  Unremarkable. Adrenals/Urinary Tract: Unenhanced appearance of bilateral adrenal glands is normal. Nonobstructive calculi are present within the collecting systems of the kidneys bilaterally, the largest of which is in the left renal pelvis measuring up to 11 mm. No ureteral or bladder calculi identified. No hydroureteronephrosis at this time. Bilateral kidneys are mildly atrophic. Unenhanced appearance of the kidneys bilaterally is otherwise unremarkable. Unenhanced appearance of the urinary bladder is normal. Stomach/Bowel: Unenhanced appearance of the intra abdominal portion of the stomach is normal. No pathologic dilatation of small bowel or colon. Numerous colonic diverticulae, particularly in the descending colon and sigmoid colon, without surrounding inflammatory changes to suggest an acute diverticulitis at this time. Normal appendix. Vascular/Lymphatic: Extensive atherosclerosis throughout the abdominal and pelvic vasculature, without definite aneurysm. No lymphadenopathy identified in the abdomen or pelvis on today's noncontrast CT examination. Reproductive: Unenhanced appearance of the uterus and ovaries is unremarkable. Other: No significant volume of  ascites.  No pneumoperitoneum. Musculoskeletal: There are no aggressive appearing lytic or blastic lesions noted in the visualized portions of the skeleton. IMPRESSION: 1. No acute findings in the abdomen or pelvis. 2. Colonic diverticulosis without evidence of acute diverticulitis at this time. 3. Bilateral nonobstructive calculi in the collecting systems of the kidneys, largest of which measures 11 mm in the left renal pelvis. No ureteral stones or findings of urinary tract obstruction are noted at this time. 4. Extensive atherosclerosis. 5. Normal appendix. 6. Large hiatal hernia. 7. The appearance of the lung bases suggest mild interstitial pulmonary edema. There also small bilateral pleural effusions layering dependently. These findings may reflect  mild congestive heart failure given the mild cardiomegaly. 8. There are calcifications of the aortic valve and mitral annulus. Echocardiographic correlation for evaluation of potential valvular dysfunction may be warranted if clinically indicated. Electronically Signed   By: Vinnie Langton M.D.   On: 06/22/2015 07:45   Dg Chest 2 View  06/21/2015  CLINICAL DATA:  Chest pain. EXAM: CHEST  2 VIEW COMPARISON:  04/24/2015 FINDINGS: Mild patient rotation right. Cardiomegaly accentuated by AP portable technique. Tortuous thoracic aorta. No pleural effusion or pneumothorax. Pulmonary interstitial prominence and indistinctness is slightly increased. No lobar consolidation. IMPRESSION: Cardiomegaly with low lung volumes and mild interstitial edema. Electronically Signed   By: Abigail Miyamoto M.D.   On: 06/21/2015 16:13     ECG: I have reviewed the current EKGs. There is no acute ST change.  Telemetry:    I have reviewed telemetry today June 22, 2015. There is normal sinus rhythm.   Impression and Recommendations    Hypotension    The patient's blood pressure has improved as she is receiving blood.    Dementia with behavioral disturbance     Unfortunately the patient's dementia is very severe. She is DNR/DNI. I recommend medical therapy for all of her problems. No aggressive cardiology intervention is indicated.    Elevated troponin     The patient has significant troponin elevation. She does not have any diagnostic EKG changes. She presented with severe anemia. In addition she has severe aortic stenosis. With this combination she certainly had very high demand on her myocardium. This could have led to significant demand ischemia. No further workup is recommended. Since severe anemia is one of the presenting problems, I agree that she should not be put on aspirin at this time.    Acute-on-chronic kidney injury ([place])       Anemia    Aortic stenosis    Aortic stenosis is severe. However no  further evaluation is indicated at this time.  At this point I recommend no further cardiac evaluation. I would recommend optimal approaches to the patient's volume status and hemoglobin. Cardiology will sign off.   Daryel November, MD 06/22/2015, 11:13 AM

## 2015-06-22 NOTE — Progress Notes (Signed)
[facility]                                                                              Patient Demographics  Jamie [name], is a 81 y.o. female, DOB - Jan 28, 1930, [id number]  Admit date - 06/21/2015   Admitting Physician Kimori Tartaglia Krystal Eaton, MD  Outpatient Primary MD for the patient is Kennyth Arnold, FNP  LOS - 1   Chief Complaint  Patient presents with  . Chest Pain  . Hypotension       Brief HPI   Patient is a 81 year old female with hypertension, hyperlipidemia, anxiety, depression, advanced dementia, SNF resident (memory care dementia unit), peptic ulcer disease presented to ED after she reported chest pain today. Patient has advanced dementia and is unable to provide any history. History was obtained from the patient's long-term friend, prior HPOA who stated that she was called by the facility today that patient was having chest pain this morning. She had visited the patient yesterday and she noticed her to be pale and very weak. At the time of my encounter, patient denies any chest pain, appears comfortable but unable to provide any history. Per EMS, she was complaining of left shoulder pain and weakness. Vital signs per EMS was 68/40 with heart rate of 110. No reports of abdominal pain, hematemesis hematochezia or melena. She has a prior history of gastric ulcer (GI Dr Cristina Gong). Prior EGD 4/16 (by Dr. Michail Sermon) had shown bleeding duodenal AVM s/p APC.  ER workup showed today a CBC with white count of 10.6, hemoglobin 5.1, hematocrit 17.7, BNP 2094, troponin 6.3, BUN 33, creatinine 2.27, AST 55. Fecal occult blood test negative. CC M was consulted in the ED due to persistent hypotension, patient was seen by Dr. Titus Mould, recommended medical management, patient is DNR/DNI.   Assessment & Plan    Principal Problem:  Hypotension with severe anemia  - BP improved, received 3 units packed RBC transfusion and IV fluids in ED.  - I had discussed with long-term  friend in ED who had been power of attorney, patient has DNR/DNI, does not want any vasopressors, focus more on comfort than any aggressive or invasive measures.   Active Problems:  Elevated troponin/ NSTEMI, chest pain: Echo 04/19/15 EF 27-51%, grade 1 diastolic dysfunction likely due to demand ischemia from severe anemia - Currently patient is not having any chest pain or shortness of breath, Troponins trended up to 8.22 last night, now trending down - Cannot have aspirin or heparin due to severe anemia of possible GI bleeding, cannot be on any beta blockers or ACE inhibitor due to hypotension. Continue statins. - Follow 2-D echo -I had discussed with on-call cardiology, Dr. Acie Fredrickson on 10/20, recommended medical/conservative management in the light of demand ischemia from severe anemia, not a candidate for aggressive invasive interventions or cardiac cath secondary to severe anemia or GI bleeding.   Acute on chronic anemia Baseline hemoglobin 9-10 - Patient has a history of peptic ulcer disease, duodenal AVMs, prior endoscopy in 12/2014 By Dr. Michail Sermon which had shown bleeding duodenal AVM - I called again this morning for GI consult, [facility] gastroenterology - Placed  on IV PPI, clear liquid diet, no obvious gross GI bleeding. Discontinued aspirin.  - status post 3 units packed of BC transfusion, hemoglobin 9.1 today  - FOBT is negative - CT of the abdomen had shown colonic diverticulosis with no diverticulitis, bilateral nonobstructive calculi, mild interstitial pulmonary edema   Dementia with behavioral disturbance - Continue Abilify, risperidone, Zoloft   Acute-on-chronic kidney injury ([place]): Baseline creatinine 1.0-1.1, lactic acidosis - Patient presented with creatinine of 2.27 likely due to severe anemia and hypotension causing hypoperfusion - Patient received 2 L IV fluids prior to the packed RBC transfusion.Creatinine improved to 1.8   lactic acidosis; likely due to severe anemia,  dehydration, lactic acid 3.78 at the time of admission - Improved to 0.87 with IV fluid hydration   Code Status: DNR/DNI   Family Communication: Discussed in detail with the patient, all imaging results, lab results explained to the patient. No family member at the bedside   Disposition Plan: Not medically ready   Time Spent in minutes   45minutes  Procedures  none   Consults   Gastroenterology, [facility]   cardiology  DVT Prophylaxis   SCD's  Medications  Scheduled Meds: . ARIPiprazole  5 mg Oral QHS  . atorvastatin  20 mg Oral QHS  . cholecalciferol  2,000 Units Oral Daily  . cyanocobalamin  1,000 mcg Oral Daily  . fluticasone  2 spray Each Nare Daily  . gabapentin  100 mg Oral QHS  . loratadine  10 mg Oral Daily  . pantoprazole (PROTONIX) IV  80 mg Intravenous Once  . [START ON 06/25/2015] pantoprazole (PROTONIX) IV  40 mg Intravenous Q12H  . polyethylene glycol  17 g Oral Daily  . polyvinyl alcohol  1 drop Both Eyes TID  . risperiDONE  0.5 mg Oral Daily   And  . risperiDONE  1 mg Oral QHS  . senna  1 tablet Oral QHS  . sertraline  50 mg Oral Daily  . sodium chloride  3 mL Intravenous Q12H   Continuous Infusions: . pantoprozole (PROTONIX) infusion 8 mg/hr (06/22/15 0645)   PRN Meds:.acetaminophen **OR** acetaminophen, morphine injection, ondansetron **OR** ondansetron (ZOFRAN) IV   Antibiotics   Anti-infectives    None        Subjective:   Lamiyah Schlotter was seen and examined today. Patient has advanced dementia, appears to be comfortable, denies any chest pain or shortness of breath, abdominal pain. No nausea or vomiting overnight. No acute issues overnight. Afebrile     Objective:   Blood pressure 112/69, pulse 89, temperature 97.9 F (36.6 C), temperature source Axillary, resp. rate 16, weight 68.266 kg (150 lb 8 oz), SpO2 95 %.  Wt Readings from Last 3 Encounters:  06/21/15 68.266 kg (150 lb 8 oz)  04/24/15 68.7 kg (151 lb 7.3 oz)  12/28/14 81.3  kg (179 lb 3.7 oz)     Intake/Output Summary (Last 24 hours) at 06/22/15 0832 Last data filed at 06/22/15 0600  Gross per 24 hour  Intake 1256.25 ml  Output   2445 ml  Net -1188.75 ml    Exam  General: Alert and oriented x self,  NAD  HEENT:  PERRLA, EOMI, Anicteric Sclera, mucous membranes moist.   Neck: Supple, no JVD, no masses  CVS: S1 S2 auscultated, no rubs, murmurs or gallops. Regular rate and rhythm.  Respiratory:  Decreased breath sounds at the bases  Abdomen : Soft, nontender, nondistended, + bowel sounds  Ext: no cyanosis clubbing or edema  Neuro: no new  deficits  Skin: No rashes  Psych: Normal affect and demeanor, alert and oriented x1   Data Review   Micro Results Recent Results (from the past 240 hour(s))  Urine culture     Status: None (Preliminary result)   Collection Time: 06/21/15  3:02 PM  Result Value Ref Range Status   Specimen Description URINE, RANDOM  Final   Special Requests NONE  Final   Culture NO GROWTH < 24 HOURS  Final   Report Status PENDING  Incomplete  MRSA PCR Screening     Status: None   Collection Time: 06/21/15  7:49 PM  Result Value Ref Range Status   MRSA by PCR NEGATIVE NEGATIVE Final    Comment:        The GeneXpert MRSA Assay (FDA approved for NASAL specimens only), is one component of a comprehensive MRSA colonization surveillance program. It is not intended to diagnose MRSA infection nor to guide or monitor treatment for MRSA infections.     Radiology Reports Ct Abdomen Pelvis Wo Contrast  06/22/2015  CLINICAL DATA:  81 year old female with history of anemia and generalize weakness. Evaluate for potential intra abdominal pathology. EXAM: CT ABDOMEN AND PELVIS WITHOUT CONTRAST TECHNIQUE: Multidetector CT imaging of the abdomen and pelvis was performed following the standard protocol without IV contrast. COMPARISON:  No priors. FINDINGS: Lower chest: Small bilateral layering pleural effusions with dependent  areas of atelectasis. Mild interlobular septal thickening may suggest a background of mild interstitial pulmonary edema. Heart size is mildly enlarged. Severe calcifications of the aortic valve and mitral annulus. Large hiatal hernia. Hepatobiliary: 13 mm well-defined low-attenuation lesion in segment 4A of the liver is incompletely characterized on today's noncontrast CT examination, but is statistically likely to represent a cyst. No other suspicious hepatic lesions are noted on today's noncontrast CT examination. Unenhanced appearance of the gallbladder is normal. Pancreas: No pancreatic mass or peripancreatic inflammatory changes on today's noncontrast CT examination. Spleen: Unremarkable. Adrenals/Urinary Tract: Unenhanced appearance of bilateral adrenal glands is normal. Nonobstructive calculi are present within the collecting systems of the kidneys bilaterally, the largest of which is in the left renal pelvis measuring up to 11 mm. No ureteral or bladder calculi identified. No hydroureteronephrosis at this time. Bilateral kidneys are mildly atrophic. Unenhanced appearance of the kidneys bilaterally is otherwise unremarkable. Unenhanced appearance of the urinary bladder is normal. Stomach/Bowel: Unenhanced appearance of the intra abdominal portion of the stomach is normal. No pathologic dilatation of small bowel or colon. Numerous colonic diverticulae, particularly in the descending colon and sigmoid colon, without surrounding inflammatory changes to suggest an acute diverticulitis at this time. Normal appendix. Vascular/Lymphatic: Extensive atherosclerosis throughout the abdominal and pelvic vasculature, without definite aneurysm. No lymphadenopathy identified in the abdomen or pelvis on today's noncontrast CT examination. Reproductive: Unenhanced appearance of the uterus and ovaries is unremarkable. Other: No significant volume of ascites.  No pneumoperitoneum. Musculoskeletal: There are no aggressive  appearing lytic or blastic lesions noted in the visualized portions of the skeleton. IMPRESSION: 1. No acute findings in the abdomen or pelvis. 2. Colonic diverticulosis without evidence of acute diverticulitis at this time. 3. Bilateral nonobstructive calculi in the collecting systems of the kidneys, largest of which measures 11 mm in the left renal pelvis. No ureteral stones or findings of urinary tract obstruction are noted at this time. 4. Extensive atherosclerosis. 5. Normal appendix. 6. Large hiatal hernia. 7. The appearance of the lung bases suggest mild interstitial pulmonary edema. There also small bilateral pleural effusions layering dependently.  These findings may reflect mild congestive heart failure given the mild cardiomegaly. 8. There are calcifications of the aortic valve and mitral annulus. Echocardiographic correlation for evaluation of potential valvular dysfunction may be warranted if clinically indicated. Electronically Signed   By: Vinnie Langton M.D.   On: 06/22/2015 07:45   Dg Chest 2 View  06/21/2015  CLINICAL DATA:  Chest pain. EXAM: CHEST  2 VIEW COMPARISON:  04/24/2015 FINDINGS: Mild patient rotation right. Cardiomegaly accentuated by AP portable technique. Tortuous thoracic aorta. No pleural effusion or pneumothorax. Pulmonary interstitial prominence and indistinctness is slightly increased. No lobar consolidation. IMPRESSION: Cardiomegaly with low lung volumes and mild interstitial edema. Electronically Signed   By: Abigail Miyamoto M.D.   On: 06/21/2015 16:13    CBC  Recent Labs Lab 06/21/15 1440 06/22/15 0214 06/22/15 0448  WBC 10.6* 9.6  --   HGB 5.1* 8.4* 9.1*  HCT 17.7* 26.6* 29.4*  PLT 422* 354  --   MCV 74.7* 78.0  --   MCH 21.5* 24.6*  --   MCHC 28.8* 31.6  --   RDW 21.4* 18.6*  --   LYMPHSABS 1.7  --   --   MONOABS 0.6  --   --   EOSABS 0.1  --   --   BASOSABS 0.0  --   --     Chemistries   Recent Labs Lab 06/21/15 1440 06/22/15 0214  NA 135 135    K 4.1 3.7  CL 104 103  CO2 22 22  GLUCOSE 119* 94  BUN 33* 28*  CREATININE 2.27* 1.80*  CALCIUM 9.7 9.1  AST 55*  --   ALT 17  --   ALKPHOS 53  --   BILITOT 0.4  --    ------------------------------------------------------------------------------------------------------------------ estimated creatinine clearance is 20.4 mL/min (by C-G formula based on Cr of 1.8). ------------------------------------------------------------------------------------------------------------------ No results for input(s): HGBA1C in the last 72 hours. ------------------------------------------------------------------------------------------------------------------ No results for input(s): CHOL, HDL, LDLCALC, TRIG, CHOLHDL, LDLDIRECT in the last 72 hours. ------------------------------------------------------------------------------------------------------------------ No results for input(s): TSH, T4TOTAL, T3FREE, THYROIDAB in the last 72 hours.  Invalid input(s): FREET3 ------------------------------------------------------------------------------------------------------------------  Recent Labs  06/21/15 2001  RETICCTPCT 2.2    Coagulation profile  Recent Labs Lab 06/21/15 1440  INR 1.11    No results for input(s): DDIMER in the last 72 hours.  Cardiac Enzymes  Recent Labs Lab 06/21/15 2001 06/22/15 0214 06/22/15 0653  TROPONINI 8.22* 7.67* 6.92*   ------------------------------------------------------------------------------------------------------------------ Invalid input(s): POCBNP  No results for input(s): GLUCAP in the last 72 hours.   Jovann Luse M.D. [facility] 06/22/2015, 8:32 AM  Pager: [phone] Between 7am to 7pm - call Pager - [phone]  After 7pm go to [email or web address] - password [email or web address]  Call night coverage person covering after 7pm

## 2015-06-22 NOTE — Clinical Documentation Improvement (Signed)
Internal Medicine  Can the diagnosis of CKD be further specified?   CKD Stage I - GFR greater than or equal to 90  CKD Stage II - GFR 60-89  CKD Stage III - GFR 30-59  CKD Stage IV - GFR 15-29  CKD Stage V - GFR < 15  ESRD (End Stage Renal Disease)  Other condition  Unable to clinically determine   Supporting Information: : (risk factors, signs and symptoms, diagnostics, treatment) stated in record that pt has acute on chronic CKD.  BUN 28, Creatinine 1.8; GFR 25  Please exercise your independent, professional judgment when responding. A specific answer is not anticipated or expected.   Thank You, [place] [phone]

## 2015-06-22 NOTE — Care Management Note (Signed)
Case Management Note  Patient Details  Name: SOMYA JAUREGUI MRN: [id number] Date of Birth: 1930/06/03  Subjective/Objective:   CM received call from Elana with [place] stating her agency had just received a referral for hospice services from pt's PCP, Dr Horald Pollen who is with [facility].  Agency had received consent form from state for hospice services - Elana will fax consent.                             Expected Discharge Plan:  Assisted Living / Rest Home  In-House Referral:  Clinical Social Work  Discharge planning Services  CM Consult  Post Acute Care Choice:  Resumption of Svcs/PTA Provider   Status of Service:  In process, will continue to follow  Girard Cooter, RN 06/22/2015, 12:06 PM

## 2015-06-22 NOTE — Progress Notes (Signed)
CSW informed pt is from [facility] and is a ward of the state.  Pt guardian is [name] [phone]  CSW called and left message with DSS guardian to confirm plan is to return to ALF when pt is ready.  CSW will continue to follow.  Domenica Reamer, [place] Social Worker [phone]

## 2015-06-22 NOTE — Clinical Documentation Improvement (Signed)
Internal Medicine  Can the diagnosis of CHF be further specified?    Acuity - Acute, Chronic, Acute on Chronic   Type - Systolic, Diastolic, Systolic and Diastolic  Other clinical explanation of clinical findings  Clinically unable to determine at this time.   Document any associated diagnoses/conditions   Supporting Information: pt noted to have troponin of 5.53 and BNP of 2094.4.  ECHO results on chart.  MI not considered at this time.  Stated was ischemia from hemoglobin of 8.4 and received 3 units of RBC   Please exercise your independent, professional judgment when responding. A specific answer is not anticipated or expected.   Thank You,  [place] [phone]

## 2015-06-22 NOTE — Progress Notes (Signed)
Echocardiogram 2D Echocardiogram has been performed.  Jamisen, Hawes 06/22/2015, 11:15 AM

## 2015-06-22 NOTE — Progress Notes (Signed)
OT Cancellation Note  Patient Details Name: Jamie [name] MRN: [id number] DOB: 12-26-1929   Cancelled Treatment:    Reason Eval/Treat Not Completed: Medical issues which prohibited therapy. Elevated troponins. Will assess when appropriate  [facility], OTR/L  [phone] 06/22/2015 06/22/2015, 2:52 PM

## 2015-06-23 DIAGNOSIS — I350 Nonrheumatic aortic (valve) stenosis: Secondary | ICD-10-CM

## 2015-06-23 LAB — TYPE AND SCREEN
ABO/RH(D): O POS
ANTIBODY SCREEN: NEGATIVE
UNIT DIVISION: 0
Unit division: 0
Unit division: 0

## 2015-06-23 LAB — BASIC METABOLIC PANEL
Anion gap: 7 (ref 5–15)
BUN: 18 mg/dL (ref 6–20)
CHLORIDE: 104 mmol/L (ref 101–111)
CO2: 24 mmol/L (ref 22–32)
CREATININE: 1.44 mg/dL — AB (ref 0.44–1.00)
Calcium: 8.8 mg/dL — ABNORMAL LOW (ref 8.9–10.3)
GFR calc non Af Amer: 33 mL/min — ABNORMAL LOW (ref >60)
GFR, EST AFRICAN AMERICAN: 38 mL/min — AB (ref >60)
Glucose, Bld: 108 mg/dL — ABNORMAL HIGH (ref 65–99)
Potassium: 3.5 mmol/L (ref 3.5–5.1)
Sodium: 135 mmol/L (ref 135–145)

## 2015-06-23 LAB — HEMOGLOBIN AND HEMATOCRIT, BLOOD
HCT: 30.3 % — ABNORMAL LOW (ref 36.0–46.0)
Hemoglobin: 9.5 g/dL — ABNORMAL LOW (ref 12.0–15.0)

## 2015-06-23 MED ORDER — FUROSEMIDE 40 MG PO TABS
40.0000 mg | ORAL_TABLET | Freq: Every day | ORAL | Status: DC
  Administered 2015-06-23 – 2015-06-25 (×3): 40 mg via ORAL
  Filled 2015-06-23 (×3): qty 1

## 2015-06-23 MED ORDER — RISPERIDONE 1 MG PO TABS: 1.0000 mg | ORAL_TABLET | Freq: Every day | ORAL | Status: DC

## 2015-06-23 MED ORDER — OLOPATADINE HCL 0.1 % OP SOLN
1.0000 [drp] | Freq: Two times a day (BID) | OPHTHALMIC | Status: DC
  Administered 2015-06-23 – 2015-06-25 (×5): 1 [drp] via OPHTHALMIC
  Filled 2015-06-23 (×2): qty 5

## 2015-06-23 MED ORDER — PANTOPRAZOLE SODIUM 40 MG PO TBEC
40.0000 mg | DELAYED_RELEASE_TABLET | Freq: Two times a day (BID) | ORAL | Status: DC
  Administered 2015-06-23 – 2015-06-25 (×6): 40 mg via ORAL
  Filled 2015-06-23 (×6): qty 1

## 2015-06-23 MED ORDER — MELATONIN 1 MG PO TABS: 1.0000 | ORAL_TABLET | Freq: Every day | ORAL | Status: DC

## 2015-06-23 MED ORDER — IPRATROPIUM-ALBUTEROL 0.5-2.5 (3) MG/3ML IN SOLN
3.0000 mL | RESPIRATORY_TRACT | Status: DC | PRN
Start: 2015-06-23 — End: 2015-06-26

## 2015-06-23 MED ORDER — PREDNISONE 5 MG PO TABS
5.0000 mg | ORAL_TABLET | Freq: Every day | ORAL | Status: DC
  Administered 2015-06-23 – 2015-06-25 (×3): 5 mg via ORAL
  Filled 2015-06-23 (×3): qty 1

## 2015-06-23 MED ORDER — POTASSIUM CHLORIDE ER 10 MEQ PO TBCR
20.0000 meq | EXTENDED_RELEASE_TABLET | Freq: Every day | ORAL | Status: DC
  Administered 2015-06-23 – 2015-06-25 (×3): 20 meq via ORAL
  Filled 2015-06-23 (×5): qty 2

## 2015-06-23 NOTE — Progress Notes (Signed)
[facility] PROGRESS NOTE    Progress Note   MERCADEZ HEITMAN GEX:[id number] DOB: 09/18/1929 DOA: 06/21/2015 PCP: Kennyth Arnold, FNP   Brief Narrative:   Jamie [name] is an 81 y.o. female past medical history of hypertension and sciatica depression advanced dementia that comes into the hospital for chest pain.  Assessment/Plan:   Hypotension severe normocytic Anemia She received 3 units of packed red blood cells along with IV fluids. As per healthcare power of attorney they do not want vasopressors and will like to focus more on comfort care, than any aggressive measures. She has a history of AVM placed on a clear liquid diet started on IV Protonix. Recommended no further workup. Discontinue hemoglobin checks. DC Protonix.   Elevated troponins in the setting of demand ischemia: She denies any chest pain after transfusion is no significant EKG changes, in the setting of severe aortic stenosis is likely due to demand ischemia. Cardiology was consulted recommend no further workup.   Dementia with behavioral disturbance Continue Abilify risperidone and Zoloft.  Acute-on-chronic kidney injury ([place]) Baseline creatinine 1.0-1.2. General elevation in creatinine most likely due to hypotension and she received 2 units of packed red blood cells and IV fluids her creatinine continues to improve today is 1.4.  Lactic acidosis: Negative due to hypotension and severe anemia now resolved.  DVT Prophylaxis - Lovenox ordered.  Family Communication: none Disposition Plan: Transfer to [place].  Code Status:     Code Status Orders        Start     Ordered   06/21/15 1939  Do not attempt resuscitation (DNR)   Continuous    Question Answer Comment  In the event of cardiac or respiratory ARREST Do not call a "code blue"   In the event of cardiac or respiratory ARREST Do not perform Intubation, CPR, defibrillation or ACLS   In the event of cardiac or respiratory ARREST Use medication  by any route, position, wound care, and other measures to relive pain and suffering. May use oxygen, suction and manual treatment of airway obstruction as needed for comfort.      06/21/15 1938        IV Access:    Peripheral IV   Procedures and diagnostic studies:   Ct Abdomen Pelvis Wo Contrast  06/22/2015  CLINICAL DATA:  81 year old female with history of anemia and generalize weakness. Evaluate for potential intra abdominal pathology. EXAM: CT ABDOMEN AND PELVIS WITHOUT CONTRAST TECHNIQUE: Multidetector CT imaging of the abdomen and pelvis was performed following the standard protocol without IV contrast. COMPARISON:  No priors. FINDINGS: Lower chest: Small bilateral layering pleural effusions with dependent areas of atelectasis. Mild interlobular septal thickening may suggest a background of mild interstitial pulmonary edema. Heart size is mildly enlarged. Severe calcifications of the aortic valve and mitral annulus. Large hiatal hernia. Hepatobiliary: 13 mm well-defined low-attenuation lesion in segment 4A of the liver is incompletely characterized on today's noncontrast CT examination, but is statistically likely to represent a cyst. No other suspicious hepatic lesions are noted on today's noncontrast CT examination. Unenhanced appearance of the gallbladder is normal. Pancreas: No pancreatic mass or peripancreatic inflammatory changes on today's noncontrast CT examination. Spleen: Unremarkable. Adrenals/Urinary Tract: Unenhanced appearance of bilateral adrenal glands is normal. Nonobstructive calculi are present within the collecting systems of the kidneys bilaterally, the largest of which is in the left renal pelvis measuring up to 11 mm. No ureteral or bladder calculi identified. No hydroureteronephrosis at this time. Bilateral kidneys are  mildly atrophic. Unenhanced appearance of the kidneys bilaterally is otherwise unremarkable. Unenhanced appearance of the urinary bladder is normal.  Stomach/Bowel: Unenhanced appearance of the intra abdominal portion of the stomach is normal. No pathologic dilatation of small bowel or colon. Numerous colonic diverticulae, particularly in the descending colon and sigmoid colon, without surrounding inflammatory changes to suggest an acute diverticulitis at this time. Normal appendix. Vascular/Lymphatic: Extensive atherosclerosis throughout the abdominal and pelvic vasculature, without definite aneurysm. No lymphadenopathy identified in the abdomen or pelvis on today's noncontrast CT examination. Reproductive: Unenhanced appearance of the uterus and ovaries is unremarkable. Other: No significant volume of ascites.  No pneumoperitoneum. Musculoskeletal: There are no aggressive appearing lytic or blastic lesions noted in the visualized portions of the skeleton. IMPRESSION: 1. No acute findings in the abdomen or pelvis. 2. Colonic diverticulosis without evidence of acute diverticulitis at this time. 3. Bilateral nonobstructive calculi in the collecting systems of the kidneys, largest of which measures 11 mm in the left renal pelvis. No ureteral stones or findings of urinary tract obstruction are noted at this time. 4. Extensive atherosclerosis. 5. Normal appendix. 6. Large hiatal hernia. 7. The appearance of the lung bases suggest mild interstitial pulmonary edema. There also small bilateral pleural effusions layering dependently. These findings may reflect mild congestive heart failure given the mild cardiomegaly. 8. There are calcifications of the aortic valve and mitral annulus. Echocardiographic correlation for evaluation of potential valvular dysfunction may be warranted if clinically indicated. Electronically Signed   By: Vinnie Langton M.D.   On: 06/22/2015 07:45   Dg Chest 2 View  06/21/2015  CLINICAL DATA:  Chest pain. EXAM: CHEST  2 VIEW COMPARISON:  04/24/2015 FINDINGS: Mild patient rotation right. Cardiomegaly accentuated by AP portable technique.  Tortuous thoracic aorta. No pleural effusion or pneumothorax. Pulmonary interstitial prominence and indistinctness is slightly increased. No lobar consolidation. IMPRESSION: Cardiomegaly with low lung volumes and mild interstitial edema. Electronically Signed   By: Abigail Miyamoto M.D.   On: 06/21/2015 16:13     Medical Consultants:    None.  Anti-Infectives:   Anti-infectives    None      Subjective:    [name] confused in no acute distress.  Objective:    Filed Vitals:   06/22/15 1915 06/22/15 2000 06/23/15 0156 06/23/15 0310  BP: 95/55 102/59 135/83 104/61  Pulse: 98 95 95 85  Temp: 98.4 F (36.9 C)  98.1 F (36.7 C) 98.6 F (37 C)  TempSrc: Oral  Oral Axillary  Resp: 18 23 23 19   Weight:      SpO2: 95% 94%      Intake/Output Summary (Last 24 hours) at 06/23/15 0703 Last data filed at 06/23/15 0653  Gross per 24 hour  Intake   1363 ml  Output   1750 ml  Net   -387 ml   Filed Weights   06/21/15 1500  Weight: 68.266 kg (150 lb 8 oz)    Exam: Gen:  NAD Cardiovascular:  RRR, No M/R/G Chest and lungs:   CTAB Abdomen:  Abdomen soft, NT/ND, + BS Extremities:  No C/E/C   Data Reviewed:    Labs: Basic Metabolic Panel:  Recent Labs Lab 06/21/15 1440 06/22/15 0214 06/23/15 0004  NA 135 135 135  K 4.1 3.7 3.5  CL 104 103 104  CO2 22 22 24   GLUCOSE 119* 94 108*  BUN 33* 28* 18  CREATININE 2.27* 1.80* 1.44*  CALCIUM 9.7 9.1 8.8*   GFR Estimated Creatinine Clearance: 25.5  mL/min (by C-G formula based on Cr of 1.44). Liver Function Tests:  Recent Labs Lab 06/21/15 1440  AST 55*  ALT 17  ALKPHOS 53  BILITOT 0.4  PROT 5.6*  ALBUMIN 3.1*    Recent Labs Lab 06/21/15 1440  LIPASE 53*   No results for input(s): AMMONIA in the last 168 hours. Coagulation profile  Recent Labs Lab 06/21/15 1440  INR 1.11    CBC:  Recent Labs Lab 06/21/15 1440 06/22/15 0214 06/22/15 0448 06/22/15 0912 06/22/15 1608 06/23/15 0004  WBC 10.6*  9.6  --   --   --   --   NEUTROABS 8.2*  --   --   --   --   --   HGB 5.1* 8.4* 9.1* 9.4* 9.3* 9.5*  HCT 17.7* 26.6* 29.4* 29.7* 29.6* 30.3*  MCV 74.7* 78.0  --   --   --   --   PLT 422* 354  --   --   --   --    Cardiac Enzymes:  Recent Labs Lab 06/21/15 1440 06/21/15 2001 06/22/15 0214 06/22/15 0653  TROPONINI 6.30* 8.22* 7.67* 6.92*   BNP (last 3 results) No results for input(s): PROBNP in the last 8760 hours. CBG: No results for input(s): GLUCAP in the last 168 hours. D-Dimer: No results for input(s): DDIMER in the last 72 hours. Hgb A1c: No results for input(s): HGBA1C in the last 72 hours. Lipid Profile: No results for input(s): CHOL, HDL, LDLCALC, TRIG, CHOLHDL, LDLDIRECT in the last 72 hours. Thyroid function studies: No results for input(s): TSH, T4TOTAL, T3FREE, THYROIDAB in the last 72 hours.  Invalid input(s): FREET3 Anemia work up:  Recent Labs  06/21/15 2001  RETICCTPCT 2.2   Sepsis Labs:  Recent Labs Lab 06/21/15 1440 06/21/15 1458 06/21/15 1830 06/22/15 0214  WBC 10.6*  --   --  9.6  LATICACIDVEN  --  3.78* 0.87  --    Microbiology Recent Results (from the past 240 hour(s))  Urine culture     Status: None   Collection Time: 06/21/15  3:02 PM  Result Value Ref Range Status   Specimen Description URINE, RANDOM  Final   Special Requests NONE  Final   Culture NO GROWTH 1 DAY  Final   Report Status 06/22/2015 FINAL  Final  MRSA PCR Screening     Status: None   Collection Time: 06/21/15  7:49 PM  Result Value Ref Range Status   MRSA by PCR NEGATIVE NEGATIVE Final    Comment:        The GeneXpert MRSA Assay (FDA approved for NASAL specimens only), is one component of a comprehensive MRSA colonization surveillance program. It is not intended to diagnose MRSA infection nor to guide or monitor treatment for MRSA infections.      Medications:   . ARIPiprazole  5 mg Oral QHS  . atorvastatin  20 mg Oral QHS  . cholecalciferol  2,000  Units Oral Daily  . cyanocobalamin  1,000 mcg Oral Daily  . fluticasone  2 spray Each Nare Daily  . gabapentin  100 mg Oral QHS  . loratadine  10 mg Oral Daily  . pantoprazole (PROTONIX) IV  80 mg Intravenous Once  . [START ON 06/25/2015] pantoprazole (PROTONIX) IV  40 mg Intravenous Q12H  . polyethylene glycol  17 g Oral Daily  . polyvinyl alcohol  1 drop Both Eyes TID  . risperiDONE  0.5 mg Oral Daily   And  . risperiDONE  1 mg Oral  QHS  . senna  1 tablet Oral QHS  . sertraline  50 mg Oral Daily  . sodium chloride  3 mL Intravenous Q12H   Continuous Infusions: . pantoprozole (PROTONIX) infusion 8 mg/hr (06/23/15 0616)    Time spent: 15 min   LOS: 2 days   Charlynne Cousins  [facility] Pager [phone]  *Please refer to [email or web address], password [email or web address] to get updated schedule on who will round on this patient, as hospitalists switch teams weekly. If 7PM-7AM, please contact night-coverage at [email or web address], password [email or web address] for any overnight needs.  06/23/2015, 7:03 AM

## 2015-06-24 DIAGNOSIS — I9589 Other hypotension: Secondary | ICD-10-CM

## 2015-06-24 MED ORDER — FUROSEMIDE 40 MG PO TABS: 40.0000 mg | ORAL_TABLET | Freq: Every day | ORAL | Status: DC

## 2015-06-24 MED ORDER — PREDNISONE 5 MG PO TABS: 5.0000 mg | ORAL_TABLET | Freq: Every day | ORAL | Status: DC

## 2015-06-24 MED ORDER — RISPERIDONE 1 MG PO TABS: 1.0000 mg | ORAL_TABLET | Freq: Every day | ORAL | Status: DC

## 2015-06-24 MED ORDER — POTASSIUM CHLORIDE ER 10 MEQ PO TBCR: 20.0000 meq | EXTENDED_RELEASE_TABLET | Freq: Every day | ORAL | Status: DC

## 2015-06-24 MED ORDER — OLOPATADINE HCL 0.1 % OP SOLN: 1.0000 [drp] | Freq: Two times a day (BID) | OPHTHALMIC | Status: DC

## 2015-06-24 MED ORDER — POLYETHYLENE GLYCOL 3350 17 G PO PACK: 17.0000 g | PACK | Freq: Every day | ORAL | Status: DC

## 2015-06-24 MED ORDER — FLUTICASONE PROPIONATE 50 MCG/ACT NA SUSP: 2.0000 | Freq: Every day | NASAL | Status: DC

## 2015-06-24 MED ORDER — PANTOPRAZOLE SODIUM 40 MG PO TBEC: 40.0000 mg | DELAYED_RELEASE_TABLET | Freq: Two times a day (BID) | ORAL | Status: DC

## 2015-06-24 MED ORDER — RISPERIDONE 0.5 MG PO TABS: 0.5000 mg | ORAL_TABLET | Freq: Two times a day (BID) | ORAL | Status: DC

## 2015-06-24 MED ORDER — HYPROMELLOSE 0.5 % OP SOLN: 1.0000 [drp] | Freq: Three times a day (TID) | OPHTHALMIC | Status: DC

## 2015-06-24 MED ORDER — SENNA 8.6 MG PO TABS: 1.0000 | ORAL_TABLET | Freq: Every day | ORAL | Status: DC

## 2015-06-24 MED ORDER — ARIPIPRAZOLE 5 MG PO TABS: 5.0000 mg | ORAL_TABLET | Freq: Every day | ORAL | Status: DC

## 2015-06-24 MED ORDER — GABAPENTIN 100 MG PO CAPS: 100.0000 mg | ORAL_CAPSULE | Freq: Every day | ORAL | Status: DC

## 2015-06-24 MED ORDER — SERTRALINE HCL 50 MG PO TABS: 50.0000 mg | ORAL_TABLET | Freq: Every day | ORAL | Status: DC

## 2015-06-24 NOTE — Progress Notes (Addendum)
Social Worker Jody made aware of patient discharge per MD order.

## 2015-06-24 NOTE — Progress Notes (Signed)
Spoke with med tech at [facility].  They cannot accept pt back today as there is not an RN at the facilty to review d/c summary/approve admission.  WIll have Unit CSW to f/u in am.

## 2015-06-24 NOTE — Discharge Summary (Addendum)
Physician Discharge Summary  Jamie [name] JIR:[id number] DOB: 15-Mar-1930 DOA: 06/21/2015  PCP: Kennyth Arnold, FNP  Admit date: 06/21/2015 Discharge date: 06/24/2015  Time spent: 35 minutes  Recommendations for Outpatient Follow-up:  1. She will go back to [facility].  2. Palliative care to meet with healthcare power of attorney at facility  Discharge Diagnoses:  Principal Problem:   Hypotension Active Problems:   Dementia with behavioral disturbance   Elevated troponin   Acute-on-chronic kidney injury ([place])   Anemia   Chest pain   Anemia associated with acute blood loss   Aortic stenosis   Discharge Condition: guarded  Diet recommendation: regualr  Filed Weights   06/21/15 1500  Weight: 68.266 kg (150 lb 8 oz)    History of present illness:  81 year old female with multiple past medical problems which includes severe advanced dementia currently on a memory care unit with a history of peptic ulcer disease and AVM diagnosed by EGD on 12/16/2014  that comes to the ED after she reported chest pain. And was found to have a low hemoglobin in the hospital.  Hospital Course:  Hypotension/severe anemia: She received 3 units of packed red blood cells in with IV fluids and her blood pressure improved. It was discussed by my partner her CODE STATUS with her healthcare power of attorney, and she confirmed the patient is a DNR/DNI and would not want any vasopressors and will start focusing more on comfort measures and try to avoid any aggressive or invasive measures or therapies. GI was consulted who recommended no further interventions. Her aspirin was stopped.  Elevated troponins in the setting of severe anemia: A 2-D echo was done that showed an ejection fraction of 45% with grade 1 diastolic heart failure her elevated troponin most likely due to demand ischemia in the setting of severe anemia and aortic stenosis. Cardiology was consulted who recommended no further workup  and recommended conservative therapy. The patient did not be on a beta blocker and ACE inhibitor due to hypotension so these were held.  Acute on chronic anemia with baseline hemoglobin around 9 or 10: Patient has a history of peptic ulcer disease and AVM she's had an endoscopy on 4 2016 which she showed bleeding duodenal AVM. GI was consulted, patient was started on IV Protonix and aspirin was discontinued. GI recommended no further aggressive therapy, she received 3 units of packed red blood cells her hemoglobin came up to 9.5. CT scan of the abdomen and pelvis showed pandiverticulosis with no diverticulitis. Reference CT was negative. Palliative care will meet with health care power of attorney as an outpatient to discuss goals of care.  Dementia with behavioral disturbances: No changes were made to her medication.  Acute on chronic kidney disease 3: There is likely due to episode of hypotension does resolve with aggressive volume resuscitation.. Her creatinine returned to baseline.  Lactic acidosis: This probably due to hypotension and severe anemia this resolve with aggressive volume expansion.   Procedures:  CXR  echo  Consultations:  Cardiology  Gastroenterology  Discharge Exam: Filed Vitals:   06/24/15 0541  BP: 109/73  Pulse: 81  Temp: 97.9 F (36.6 C)  Resp: 20    General: A&O x2 Cardiovascular: RRR Respiratory: good air movement CTA B/L  Discharge Instructions   Discharge Instructions    Diet - low sodium heart healthy    Complete by:  As directed      Increase activity slowly    Complete by:  As directed  Current Discharge Medication List    CONTINUE these medications which have NOT CHANGED   Details  ARIPiprazole (ABILIFY) 5 MG tablet Take 5 mg by mouth at bedtime.    fluticasone (FLONASE) 50 MCG/ACT nasal spray Place 2 sprays into both nostrils daily.    furosemide (LASIX) 40 MG tablet Take 1 tablet (40 mg total) by mouth  daily. Qty: 30 tablet, Refills: 0    gabapentin (NEURONTIN) 100 MG capsule Take 100 mg by mouth at bedtime.    Hypromellose (ISOPTO TEARS) 0.5 % SOLN Apply 1 drop to eye 3 (three) times daily. One drop in each eye, three times daily.    Melatonin 1 MG TABS Take 1 tablet by mouth at bedtime.    olopatadine (PATANOL) 0.1 % ophthalmic solution Place 1 drop into both eyes 2 (two) times daily.    pantoprazole (PROTONIX) 40 MG tablet Take 1 tablet (40 mg total) by mouth 2 (two) times daily before a meal. Take 1 tablet 2 times daily x 4 weeks, then 1 tablet daily. Qty: 90 tablet, Refills: 0    polyethylene glycol (MIRALAX / GLYCOLAX) packet Take 17 g by mouth daily.    potassium chloride (K-DUR) 10 MEQ tablet Take 2 tablets (20 mEq total) by mouth daily. Qty: 30 tablet, Refills: 0    !! risperiDONE (RISPERDAL) 0.5 MG tablet Take 0.5-1 mg by mouth 2 (two) times daily. 1 tab in the morning and 2 tabs at bedtime    !! risperiDONE (RISPERDAL) 1 MG tablet Take 1 tablet (1 mg total) by mouth at bedtime. Qty: 30 tablet, Refills: 3    senna (SENOKOT) 8.6 MG TABS tablet Take 1 tablet (8.6 mg total) by mouth at bedtime. Qty: 120 each, Refills: 0    sertraline (ZOLOFT) 50 MG tablet Take 50 mg by mouth daily.    ipratropium-albuterol (DUONEB) 0.5-2.5 (3) MG/3ML SOLN Take 3 mLs by nebulization every 4 (four) hours as needed (wheezing, sob). Qty: 360 mL, Refills: 0    predniSONE (DELTASONE) 5 MG tablet Take 1 tablet (5 mg total) by mouth daily with breakfast.     !! - Potential duplicate medications found. Please discuss with provider.    STOP taking these medications     aspirin EC 81 MG tablet      atorvastatin (LIPITOR) 20 MG tablet      Cholecalciferol (VITAMIN D) 2000 UNITS tablet      lisinopril (PRINIVIL,ZESTRIL) 20 MG tablet      loratadine (CLARITIN) 10 MG tablet      Multiple Vitamins-Minerals (THERA-TABS M) TABS      vitamin B-12 (CYANOCOBALAMIN) 500 MCG tablet       benzonatate (TESSALON) 100 MG capsule      hydroxypropyl methylcellulose / hypromellose (ISOPTO TEARS / GONIOVISC) 2.5 % ophthalmic solution        Allergies  Allergen Reactions  . Pollen Extract Other (See Comments)    Swollen Eyes & Runny Nose.   Follow-up Information    Follow up with Kennyth Arnold, FNP In 2 weeks.   Specialty:  Family Medicine   Why:  Discuss goals of care.    Contact information:   [address] [place] [address] [phone]        The results of significant diagnostics from this hospitalization (including imaging, microbiology, ancillary and laboratory) are listed below for reference.    Significant Diagnostic Studies: Ct Abdomen Pelvis Wo Contrast  06/22/2015  CLINICAL DATA:  81 year old female with history of anemia and  generalize weakness. Evaluate for potential intra abdominal pathology. EXAM: CT ABDOMEN AND PELVIS WITHOUT CONTRAST TECHNIQUE: Multidetector CT imaging of the abdomen and pelvis was performed following the standard protocol without IV contrast. COMPARISON:  No priors. FINDINGS: Lower chest: Small bilateral layering pleural effusions with dependent areas of atelectasis. Mild interlobular septal thickening may suggest a background of mild interstitial pulmonary edema. Heart size is mildly enlarged. Severe calcifications of the aortic valve and mitral annulus. Large hiatal hernia. Hepatobiliary: 13 mm well-defined low-attenuation lesion in segment 4A of the liver is incompletely characterized on today's noncontrast CT examination, but is statistically likely to represent a cyst. No other suspicious hepatic lesions are noted on today's noncontrast CT examination. Unenhanced appearance of the gallbladder is normal. Pancreas: No pancreatic mass or peripancreatic inflammatory changes on today's noncontrast CT examination. Spleen: Unremarkable. Adrenals/Urinary Tract: Unenhanced appearance of bilateral adrenal glands is normal.  Nonobstructive calculi are present within the collecting systems of the kidneys bilaterally, the largest of which is in the left renal pelvis measuring up to 11 mm. No ureteral or bladder calculi identified. No hydroureteronephrosis at this time. Bilateral kidneys are mildly atrophic. Unenhanced appearance of the kidneys bilaterally is otherwise unremarkable. Unenhanced appearance of the urinary bladder is normal. Stomach/Bowel: Unenhanced appearance of the intra abdominal portion of the stomach is normal. No pathologic dilatation of small bowel or colon. Numerous colonic diverticulae, particularly in the descending colon and sigmoid colon, without surrounding inflammatory changes to suggest an acute diverticulitis at this time. Normal appendix. Vascular/Lymphatic: Extensive atherosclerosis throughout the abdominal and pelvic vasculature, without definite aneurysm. No lymphadenopathy identified in the abdomen or pelvis on today's noncontrast CT examination. Reproductive: Unenhanced appearance of the uterus and ovaries is unremarkable. Other: No significant volume of ascites.  No pneumoperitoneum. Musculoskeletal: There are no aggressive appearing lytic or blastic lesions noted in the visualized portions of the skeleton. IMPRESSION: 1. No acute findings in the abdomen or pelvis. 2. Colonic diverticulosis without evidence of acute diverticulitis at this time. 3. Bilateral nonobstructive calculi in the collecting systems of the kidneys, largest of which measures 11 mm in the left renal pelvis. No ureteral stones or findings of urinary tract obstruction are noted at this time. 4. Extensive atherosclerosis. 5. Normal appendix. 6. Large hiatal hernia. 7. The appearance of the lung bases suggest mild interstitial pulmonary edema. There also small bilateral pleural effusions layering dependently. These findings may reflect mild congestive heart failure given the mild cardiomegaly. 8. There are calcifications of the aortic  valve and mitral annulus. Echocardiographic correlation for evaluation of potential valvular dysfunction may be warranted if clinically indicated. Electronically Signed   By: Vinnie Langton M.D.   On: 06/22/2015 07:45   Dg Chest 2 View  06/21/2015  CLINICAL DATA:  Chest pain. EXAM: CHEST  2 VIEW COMPARISON:  04/24/2015 FINDINGS: Mild patient rotation right. Cardiomegaly accentuated by AP portable technique. Tortuous thoracic aorta. No pleural effusion or pneumothorax. Pulmonary interstitial prominence and indistinctness is slightly increased. No lobar consolidation. IMPRESSION: Cardiomegaly with low lung volumes and mild interstitial edema. Electronically Signed   By: Abigail Miyamoto M.D.   On: 06/21/2015 16:13    Microbiology: Recent Results (from the past 240 hour(s))  Culture, blood (routine x 2)     Status: None (Preliminary result)   Collection Time: 06/21/15  2:40 PM  Result Value Ref Range Status   Specimen Description BLOOD RIGHT ARM  Final   Special Requests BOTTLES DRAWN AEROBIC AND ANAEROBIC 5ML  Final   Culture NO  GROWTH 3 DAYS  Final   Report Status PENDING  Incomplete  Urine culture     Status: None   Collection Time: 06/21/15  3:02 PM  Result Value Ref Range Status   Specimen Description URINE, RANDOM  Final   Special Requests NONE  Final   Culture NO GROWTH 1 DAY  Final   Report Status 06/22/2015 FINAL  Final  Culture, blood (routine x 2)     Status: None (Preliminary result)   Collection Time: 06/21/15  3:07 PM  Result Value Ref Range Status   Specimen Description BLOOD RIGHT HAND  Final   Special Requests IN PEDIATRIC BOTTLE 2CC  Final   Culture NO GROWTH 3 DAYS  Final   Report Status PENDING  Incomplete  MRSA PCR Screening     Status: None   Collection Time: 06/21/15  7:49 PM  Result Value Ref Range Status   MRSA by PCR NEGATIVE NEGATIVE Final    Comment:        The GeneXpert MRSA Assay (FDA approved for NASAL specimens only), is one component of  a comprehensive MRSA colonization surveillance program. It is not intended to diagnose MRSA infection nor to guide or monitor treatment for MRSA infections.      Labs: Basic Metabolic Panel:  Recent Labs Lab 06/21/15 1440 06/22/15 0214 06/23/15 0004  NA 135 135 135  K 4.1 3.7 3.5  CL 104 103 104  CO2 22 22 24   GLUCOSE 119* 94 108*  BUN 33* 28* 18  CREATININE 2.27* 1.80* 1.44*  CALCIUM 9.7 9.1 8.8*   Liver Function Tests:  Recent Labs Lab 06/21/15 1440  AST 55*  ALT 17  ALKPHOS 53  BILITOT 0.4  PROT 5.6*  ALBUMIN 3.1*    Recent Labs Lab 06/21/15 1440  LIPASE 53*   No results for input(s): AMMONIA in the last 168 hours. CBC:  Recent Labs Lab 06/21/15 1440 06/22/15 0214 06/22/15 0448 06/22/15 0912 06/22/15 1608 06/23/15 0004  WBC 10.6* 9.6  --   --   --   --   NEUTROABS 8.2*  --   --   --   --   --   HGB 5.1* 8.4* 9.1* 9.4* 9.3* 9.5*  HCT 17.7* 26.6* 29.4* 29.7* 29.6* 30.3*  MCV 74.7* 78.0  --   --   --   --   PLT 422* 354  --   --   --   --    Cardiac Enzymes:  Recent Labs Lab 06/21/15 1440 06/21/15 2001 06/22/15 0214 06/22/15 0653  TROPONINI 6.30* 8.22* 7.67* 6.92*   BNP: BNP (last 3 results)  Recent Labs  04/17/15 1655 06/21/15 1440  BNP 125.7* 2094.4*    ProBNP (last 3 results) No results for input(s): PROBNP in the last 8760 hours.  CBG: No results for input(s): GLUCAP in the last 168 hours.     Signed:  Charlynne Cousins  [facility] 06/24/2015, 11:37 AM

## 2015-06-25 MED ORDER — FLUTICASONE PROPIONATE 50 MCG/ACT NA SUSP: 2.0000 | Freq: Every day | NASAL | Status: DC

## 2015-06-25 MED ORDER — CARBAMIDE PEROXIDE 6.5 % OT SOLN: 5.0000 [drp] | Freq: Two times a day (BID) | OTIC | Status: AC

## 2015-06-25 NOTE — Progress Notes (Signed)
Notified by Candise Che the patients controlled substance prescriptions from Sunday were not signed, MD Abrol paged.

## 2015-06-25 NOTE — Care Management Important Message (Signed)
Important Message  Patient Details  Name: Jamie [name] MRN: [id number] Date of Birth: 03-03-1930   Medicare Important Message Given:  Yes-second notification given    Nathen May 06/25/2015, 10:49 AM

## 2015-06-25 NOTE — Care Management Note (Signed)
Case Management Note  Patient Details  Name: Jamie [name] MRN: [id number] Date of Birth: September 27, 1929  Subjective/Objective:    Patient is from [facility], plan is for dc back there today, CSW aware.  NCM will cont to follow for dc needs.                Action/Plan:   Expected Discharge Date:                  Expected Discharge Plan:  Assisted Living / Rest Home  In-House Referral:  Clinical Social Work  Discharge planning Services  CM Consult  Post Acute Care Choice:  Resumption of Svcs/PTA Provider Choice offered to:     DME Arranged:    DME Agency:     HH Arranged:    [place] Agency:     Status of Service:  Completed, signed off  Medicare Important Message Given:    Date Medicare IM Given:    Medicare IM give by:    Date Additional Medicare IM Given:    Additional Medicare Important Message give by:     If discussed at [place] of Stay Meetings, dates discussed:    Additional Comments:  Zenon Mayo, RN 06/25/2015, 10:26 AM

## 2015-06-25 NOTE — Discharge Summary (Addendum)
Physician Discharge Summary  Jamie [name] SWF:[id number] DOB: 02/19/1930 DOA: 06/21/2015  PCP: Kennyth Arnold, FNP  Admit date: 06/21/2015 Discharge date: 06/25/2015  Time spent: 35 minutes  Recommendations for Outpatient Follow-up:  1. She will go back to [facility].  2. Palliative care to meet with healthcare power of attorney at facility  Discharge Diagnoses:  Principal Problem:   Hypotension Active Problems:   Dementia with behavioral disturbance   Elevated troponin   Acute-on-chronic kidney injury ([place])   Anemia   Chest pain   Anemia associated with acute blood loss   Aortic stenosis  chronic diastolic heart failure  Discharge Condition: guarded  Diet recommendation: regualr  Filed Weights   06/21/15 1500  Weight: 68.266 kg (150 lb 8 oz)    History of present illness:  81 year old female with multiple past medical problems which includes severe advanced dementia currently on a memory care unit with a history of peptic ulcer disease and AVM diagnosed by EGD on 12/16/2014  that comes to the ED after she reported chest pain. And was found to have a low hemoglobin in the hospital.  Hospital Course:  Hypotension/severe anemia: She received 3 units of packed red blood cells in with IV fluids and her blood pressure improved. It was discussed by my partner her CODE STATUS with her healthcare power of attorney, and she confirmed the patient is a DNR/DNI and would not want any vasopressors and will start focusing more on comfort measures and try to avoid any aggressive or invasive measures or therapies. GI was consulted who recommended no further interventions. Her aspirin was stopped.  Elevated troponins in the setting of severe anemia: A 2-D echo was done that showed an ejection fraction of 45% with grade 1 diastolic heart failure her elevated troponin most likely due to demand ischemia in the setting of severe anemia and aortic stenosis. Cardiology was consulted  who recommended no further workup and recommended conservative therapy. The patient did not be on a beta blocker and ACE inhibitor due to hypotension so these were held.  Acute on chronic anemia with baseline hemoglobin around 9 or 10: Patient has a history of peptic ulcer disease and AVM she's had an endoscopy on 4 2016 which she showed bleeding duodenal AVM. GI was consulted, patient was started on IV Protonix and aspirin was discontinued. GI recommended no further aggressive therapy, she received 3 units of packed red blood cells her hemoglobin came up to 9.5. CT scan of the abdomen and pelvis showed pandiverticulosis with no diverticulitis. Reference CT was negative. Palliative care will meet with health care power of attorney as an outpatient to discuss goals of care.  Dementia with behavioral disturbances: No changes were made to her medication.  Acute on chronic kidney disease 3: There is likely due to episode of hypotension does resolve with aggressive volume resuscitation.. Her creatinine returned to baseline.  Lactic acidosis: This probably due to hypotension and severe anemia this resolve with aggressive volume expansion.   Hearing loss Likely secondary to cerumen impaction patient is to continue with Flonase for sinus congestion, patient has also been started on DEBROX eardrops for 1 week for possible cerumen impaction Patient will need an ear lavage in 1-2 weeks  Procedures:  CXR  echo  Consultations:  Cardiology  Gastroenterology  Discharge Exam: Filed Vitals:   06/25/15 0603  BP: 121/69  Pulse: 94  Temp: 98.4 F (36.9 C)  Resp: 14    General: A&O x2 Cardiovascular: RRR Respiratory:  good air movement CTA B/L  Discharge Instructions   Discharge Instructions    Diet - low sodium heart healthy    Complete by:  As directed      Diet - low sodium heart healthy    Complete by:  As directed      Diet - low sodium heart healthy    Complete by:  As  directed      Increase activity slowly    Complete by:  As directed      Increase activity slowly    Complete by:  As directed      Increase activity slowly    Complete by:  As directed           Current Discharge Medication List    CONTINUE these medications which have CHANGED   Details  ARIPiprazole (ABILIFY) 5 MG tablet Take 1 tablet (5 mg total) by mouth at bedtime. Qty: 10 tablet, Refills: 0    fluticasone (FLONASE) 50 MCG/ACT nasal spray Place 2 sprays into both nostrils daily. Qty: 1 g, Refills: 2    furosemide (LASIX) 40 MG tablet Take 1 tablet (40 mg total) by mouth daily. Qty: 30 tablet, Refills: 0    gabapentin (NEURONTIN) 100 MG capsule Take 1 capsule (100 mg total) by mouth at bedtime. Qty: 10 capsule, Refills: 0    Hypromellose (ISOPTO TEARS) 0.5 % SOLN Apply 1 drop to eye 3 (three) times daily. One drop in each eye, three times daily. Qty: 1 Bottle, Refills: 0    olopatadine (PATANOL) 0.1 % ophthalmic solution Place 1 drop into both eyes 2 (two) times daily. Qty: 5 mL, Refills: 12    pantoprazole (PROTONIX) 40 MG tablet Take 1 tablet (40 mg total) by mouth 2 (two) times daily before a meal. Qty: 90 tablet, Refills: 0    polyethylene glycol (MIRALAX / GLYCOLAX) packet Take 17 g by mouth daily. Qty: 14 each, Refills: 0    potassium chloride (K-DUR) 10 MEQ tablet Take 2 tablets (20 mEq total) by mouth daily. Qty: 30 tablet, Refills: 0    predniSONE (DELTASONE) 5 MG tablet Take 1 tablet (5 mg total) by mouth daily with breakfast. Qty: 10 tablet, Refills: 0    !! risperiDONE (RISPERDAL) 0.5 MG tablet Take 1-2 tablets (0.5-1 mg total) by mouth 2 (two) times daily. 1 tab in the morning and 2 tabs at bedtime Qty: 10 tablet, Refills: 0    !! risperiDONE (RISPERDAL) 1 MG tablet Take 1 tablet (1 mg total) by mouth at bedtime. Qty: 30 tablet, Refills: 3    senna (SENOKOT) 8.6 MG TABS tablet Take 1 tablet (8.6 mg total) by mouth at bedtime. Qty: 120 each,  Refills: 0    sertraline (ZOLOFT) 50 MG tablet Take 1 tablet (50 mg total) by mouth daily. Qty: 10 tablet, Refills: 0     !! - Potential duplicate medications found. Please discuss with provider.    CONTINUE these medications which have NOT CHANGED   Details  Melatonin 1 MG TABS Take 1 tablet by mouth at bedtime.      STOP taking these medications     aspirin EC 81 MG tablet      atorvastatin (LIPITOR) 20 MG tablet      Cholecalciferol (VITAMIN D) 2000 UNITS tablet      lisinopril (PRINIVIL,ZESTRIL) 20 MG tablet      loratadine (CLARITIN) 10 MG tablet      Multiple Vitamins-Minerals (THERA-TABS M) TABS      vitamin  B-12 (CYANOCOBALAMIN) 500 MCG tablet      benzonatate (TESSALON) 100 MG capsule      hydroxypropyl methylcellulose / hypromellose (ISOPTO TEARS / GONIOVISC) 2.5 % ophthalmic solution      ipratropium-albuterol (DUONEB) 0.5-2.5 (3) MG/3ML SOLN        Allergies  Allergen Reactions  . Pollen Extract Other (See Comments)    Swollen Eyes & Runny Nose.   Follow-up Information    Follow up with Kennyth Arnold, FNP In 2 weeks.   Specialty:  Family Medicine   Why:  Discuss goals of care.    Contact information:   [place] [id number] [phone]        The results of significant diagnostics from this hospitalization (including imaging, microbiology, ancillary and laboratory) are listed below for reference.    Significant Diagnostic Studies: Ct Abdomen Pelvis Wo Contrast  06/22/2015  CLINICAL DATA:  81 year old female with history of anemia and generalize weakness. Evaluate for potential intra abdominal pathology. EXAM: CT ABDOMEN AND PELVIS WITHOUT CONTRAST TECHNIQUE: Multidetector CT imaging of the abdomen and pelvis was performed following the standard protocol without IV contrast. COMPARISON:  No priors. FINDINGS: Lower chest: Small bilateral layering pleural effusions with dependent areas of atelectasis. Mild interlobular  septal thickening may suggest a background of mild interstitial pulmonary edema. Heart size is mildly enlarged. Severe calcifications of the aortic valve and mitral annulus. Large hiatal hernia. Hepatobiliary: 13 mm well-defined low-attenuation lesion in segment 4A of the liver is incompletely characterized on today's noncontrast CT examination, but is statistically likely to represent a cyst. No other suspicious hepatic lesions are noted on today's noncontrast CT examination. Unenhanced appearance of the gallbladder is normal. Pancreas: No pancreatic mass or peripancreatic inflammatory changes on today's noncontrast CT examination. Spleen: Unremarkable. Adrenals/Urinary Tract: Unenhanced appearance of bilateral adrenal glands is normal. Nonobstructive calculi are present within the collecting systems of the kidneys bilaterally, the largest of which is in the left renal pelvis measuring up to 11 mm. No ureteral or bladder calculi identified. No hydroureteronephrosis at this time. Bilateral kidneys are mildly atrophic. Unenhanced appearance of the kidneys bilaterally is otherwise unremarkable. Unenhanced appearance of the urinary bladder is normal. Stomach/Bowel: Unenhanced appearance of the intra abdominal portion of the stomach is normal. No pathologic dilatation of small bowel or colon. Numerous colonic diverticulae, particularly in the descending colon and sigmoid colon, without surrounding inflammatory changes to suggest an acute diverticulitis at this time. Normal appendix. Vascular/Lymphatic: Extensive atherosclerosis throughout the abdominal and pelvic vasculature, without definite aneurysm. No lymphadenopathy identified in the abdomen or pelvis on today's noncontrast CT examination. Reproductive: Unenhanced appearance of the uterus and ovaries is unremarkable. Other: No significant volume of ascites.  No pneumoperitoneum. Musculoskeletal: There are no aggressive appearing lytic or blastic lesions noted in  the visualized portions of the skeleton. IMPRESSION: 1. No acute findings in the abdomen or pelvis. 2. Colonic diverticulosis without evidence of acute diverticulitis at this time. 3. Bilateral nonobstructive calculi in the collecting systems of the kidneys, largest of which measures 11 mm in the left renal pelvis. No ureteral stones or findings of urinary tract obstruction are noted at this time. 4. Extensive atherosclerosis. 5. Normal appendix. 6. Large hiatal hernia. 7. The appearance of the lung bases suggest mild interstitial pulmonary edema. There also small bilateral pleural effusions layering dependently. These findings may reflect mild congestive heart failure given the mild cardiomegaly. 8. There are calcifications of the aortic valve and mitral annulus. Echocardiographic correlation for  evaluation of potential valvular dysfunction may be warranted if clinically indicated. Electronically Signed   By: Vinnie Langton M.D.   On: 06/22/2015 07:45   Dg Chest 2 View  06/21/2015  CLINICAL DATA:  Chest pain. EXAM: CHEST  2 VIEW COMPARISON:  04/24/2015 FINDINGS: Mild patient rotation right. Cardiomegaly accentuated by AP portable technique. Tortuous thoracic aorta. No pleural effusion or pneumothorax. Pulmonary interstitial prominence and indistinctness is slightly increased. No lobar consolidation. IMPRESSION: Cardiomegaly with low lung volumes and mild interstitial edema. Electronically Signed   By: Abigail Miyamoto M.D.   On: 06/21/2015 16:13    Microbiology: Recent Results (from the past 240 hour(s))  Culture, blood (routine x 2)     Status: None (Preliminary result)   Collection Time: 06/21/15  2:40 PM  Result Value Ref Range Status   Specimen Description BLOOD RIGHT ARM  Final   Special Requests BOTTLES DRAWN AEROBIC AND ANAEROBIC 5ML  Final   Culture NO GROWTH 3 DAYS  Final   Report Status PENDING  Incomplete  Urine culture     Status: None   Collection Time: 06/21/15  3:02 PM  Result Value  Ref Range Status   Specimen Description URINE, RANDOM  Final   Special Requests NONE  Final   Culture NO GROWTH 1 DAY  Final   Report Status 06/22/2015 FINAL  Final  Culture, blood (routine x 2)     Status: None (Preliminary result)   Collection Time: 06/21/15  3:07 PM  Result Value Ref Range Status   Specimen Description BLOOD RIGHT HAND  Final   Special Requests IN PEDIATRIC BOTTLE 2CC  Final   Culture NO GROWTH 3 DAYS  Final   Report Status PENDING  Incomplete  MRSA PCR Screening     Status: None   Collection Time: 06/21/15  7:49 PM  Result Value Ref Range Status   MRSA by PCR NEGATIVE NEGATIVE Final    Comment:        The GeneXpert MRSA Assay (FDA approved for NASAL specimens only), is one component of a comprehensive MRSA colonization surveillance program. It is not intended to diagnose MRSA infection nor to guide or monitor treatment for MRSA infections.      Labs: Basic Metabolic Panel:  Recent Labs Lab 06/21/15 1440 06/22/15 0214 06/23/15 0004  NA 135 135 135  K 4.1 3.7 3.5  CL 104 103 104  CO2 22 22 24   GLUCOSE 119* 94 108*  BUN 33* 28* 18  CREATININE 2.27* 1.80* 1.44*  CALCIUM 9.7 9.1 8.8*   Liver Function Tests:  Recent Labs Lab 06/21/15 1440  AST 55*  ALT 17  ALKPHOS 53  BILITOT 0.4  PROT 5.6*  ALBUMIN 3.1*    Recent Labs Lab 06/21/15 1440  LIPASE 53*   No results for input(s): AMMONIA in the last 168 hours. CBC:  Recent Labs Lab 06/21/15 1440 06/22/15 0214 06/22/15 0448 06/22/15 0912 06/22/15 1608 06/23/15 0004  WBC 10.6* 9.6  --   --   --   --   NEUTROABS 8.2*  --   --   --   --   --   HGB 5.1* 8.4* 9.1* 9.4* 9.3* 9.5*  HCT 17.7* 26.6* 29.4* 29.7* 29.6* 30.3*  MCV 74.7* 78.0  --   --   --   --   PLT 422* 354  --   --   --   --    Cardiac Enzymes:  Recent Labs Lab 06/21/15 1440 06/21/15 2001 06/22/15 0214  06/22/15 0653  TROPONINI 6.30* 8.22* 7.67* 6.92*   BNP: BNP (last 3 results)  Recent Labs  04/17/15 1655  06/21/15 1440  BNP 125.7* 2094.4*    ProBNP (last 3 results) No results for input(s): PROBNP in the last 8760 hours.  CBG: No results for input(s): GLUCAP in the last 168 hours.     SignedReyne Dumas  [facility] 06/25/2015, 9:24 AM

## 2015-06-25 NOTE — Progress Notes (Signed)
Pt being discharged to [facility], EMS here to pick up patient. Pt discharged in stable condition.

## 2015-06-25 NOTE — Progress Notes (Addendum)
Non-emergent ambulance transport to [place] on behalf of pt.  VM left for pt's (DSS) guardian, [name], re: pt's d/c this pm.  Creta Levin, LCSW Evening/ED Coverage [phone]

## 2015-06-25 NOTE — Progress Notes (Signed)
Report called to Janet Berlin RN Director at [facility]. Notified her EMS transport was ordered for 7pm by Candise Che, and that patient would be back later tonight.

## 2015-06-25 NOTE — Progress Notes (Signed)
OT Cancellation Note  Patient Details Name: Jamie [name] MRN: [id number] DOB: [birth year]   Cancelled Treatment:    Reason Eval/Treat Not Completed: Other (comment) Pt is Medicare and current D/C plan is SNF. No apparent immediate acute care OT needs, therefore will defer OT to SNF. If OT eval is needed please call Acute Rehab Dept. at [phone] or text page OT at [phone].  [place], OTR/L  [phone] 06/25/2015 06/25/2015, 10:34 AM

## 2015-06-26 LAB — CULTURE, BLOOD (ROUTINE X 2)
Culture: NO GROWTH
Culture: NO GROWTH

## 2015-06-27 DIAGNOSIS — G309 Alzheimer's disease, unspecified: Secondary | ICD-10-CM | POA: Diagnosis not present

## 2015-06-27 DIAGNOSIS — F209 Schizophrenia, unspecified: Secondary | ICD-10-CM | POA: Diagnosis not present

## 2015-06-27 DIAGNOSIS — H6990 Unspecified Eustachian tube disorder, unspecified ear: Secondary | ICD-10-CM | POA: Diagnosis not present

## 2015-06-27 DIAGNOSIS — R609 Edema, unspecified: Secondary | ICD-10-CM | POA: Diagnosis not present

## 2015-06-27 DIAGNOSIS — I10 Essential (primary) hypertension: Secondary | ICD-10-CM | POA: Diagnosis not present

## 2015-07-04 DIAGNOSIS — H6990 Unspecified Eustachian tube disorder, unspecified ear: Secondary | ICD-10-CM | POA: Diagnosis not present

## 2015-07-04 DIAGNOSIS — H6120 Impacted cerumen, unspecified ear: Secondary | ICD-10-CM | POA: Diagnosis not present

## 2015-07-04 DIAGNOSIS — I10 Essential (primary) hypertension: Secondary | ICD-10-CM | POA: Diagnosis not present

## 2015-08-02 DIAGNOSIS — H903 Sensorineural hearing loss, bilateral: Secondary | ICD-10-CM | POA: Diagnosis not present

## 2015-08-02 DIAGNOSIS — G309 Alzheimer's disease, unspecified: Secondary | ICD-10-CM | POA: Diagnosis not present

## 2015-08-02 DIAGNOSIS — H6123 Impacted cerumen, bilateral: Secondary | ICD-10-CM | POA: Diagnosis not present

## 2015-08-08 DIAGNOSIS — Z79899 Other long term (current) drug therapy: Secondary | ICD-10-CM | POA: Diagnosis not present

## 2015-08-15 DIAGNOSIS — F329 Major depressive disorder, single episode, unspecified: Secondary | ICD-10-CM | POA: Diagnosis not present

## 2015-08-15 DIAGNOSIS — K5900 Constipation, unspecified: Secondary | ICD-10-CM | POA: Diagnosis not present

## 2015-08-22 DIAGNOSIS — J0111 Acute recurrent frontal sinusitis: Secondary | ICD-10-CM | POA: Diagnosis not present

## 2015-08-22 DIAGNOSIS — R609 Edema, unspecified: Secondary | ICD-10-CM | POA: Diagnosis not present

## 2015-11-19 DIAGNOSIS — Z79899 Other long term (current) drug therapy: Secondary | ICD-10-CM | POA: Diagnosis not present

## 2015-11-28 DIAGNOSIS — E876 Hypokalemia: Secondary | ICD-10-CM | POA: Diagnosis not present

## 2015-11-28 DIAGNOSIS — E873 Alkalosis: Secondary | ICD-10-CM | POA: Diagnosis not present

## 2015-11-28 DIAGNOSIS — I4581 Long QT syndrome: Secondary | ICD-10-CM | POA: Diagnosis not present

## 2015-11-28 DIAGNOSIS — I10 Essential (primary) hypertension: Secondary | ICD-10-CM | POA: Diagnosis not present

## 2015-12-05 DIAGNOSIS — D509 Iron deficiency anemia, unspecified: Secondary | ICD-10-CM | POA: Diagnosis not present

## 2015-12-05 DIAGNOSIS — Z79899 Other long term (current) drug therapy: Secondary | ICD-10-CM | POA: Diagnosis not present

## 2015-12-12 DIAGNOSIS — Z79899 Other long term (current) drug therapy: Secondary | ICD-10-CM | POA: Diagnosis not present

## 2015-12-17 DIAGNOSIS — D509 Iron deficiency anemia, unspecified: Secondary | ICD-10-CM | POA: Diagnosis not present

## 2015-12-17 DIAGNOSIS — E873 Alkalosis: Secondary | ICD-10-CM | POA: Diagnosis not present

## 2015-12-17 DIAGNOSIS — D649 Anemia, unspecified: Secondary | ICD-10-CM | POA: Diagnosis not present

## 2015-12-17 DIAGNOSIS — E876 Hypokalemia: Secondary | ICD-10-CM | POA: Diagnosis not present

## 2015-12-26 DIAGNOSIS — E876 Hypokalemia: Secondary | ICD-10-CM | POA: Diagnosis not present

## 2015-12-31 DIAGNOSIS — K5900 Constipation, unspecified: Secondary | ICD-10-CM | POA: Diagnosis not present

## 2015-12-31 DIAGNOSIS — I10 Essential (primary) hypertension: Secondary | ICD-10-CM | POA: Diagnosis not present

## 2015-12-31 DIAGNOSIS — J309 Allergic rhinitis, unspecified: Secondary | ICD-10-CM | POA: Diagnosis not present

## 2016-01-02 DIAGNOSIS — Z79899 Other long term (current) drug therapy: Secondary | ICD-10-CM | POA: Diagnosis not present

## 2016-01-07 DIAGNOSIS — G309 Alzheimer's disease, unspecified: Secondary | ICD-10-CM | POA: Diagnosis not present

## 2016-01-07 DIAGNOSIS — I10 Essential (primary) hypertension: Secondary | ICD-10-CM | POA: Diagnosis not present

## 2016-01-07 DIAGNOSIS — E876 Hypokalemia: Secondary | ICD-10-CM | POA: Diagnosis not present

## 2016-01-07 DIAGNOSIS — H1089 Other conjunctivitis: Secondary | ICD-10-CM | POA: Diagnosis not present

## 2016-01-09 DIAGNOSIS — Z79899 Other long term (current) drug therapy: Secondary | ICD-10-CM | POA: Diagnosis not present

## 2016-01-14 DIAGNOSIS — K219 Gastro-esophageal reflux disease without esophagitis: Secondary | ICD-10-CM | POA: Diagnosis not present

## 2016-01-14 DIAGNOSIS — G4700 Insomnia, unspecified: Secondary | ICD-10-CM | POA: Diagnosis not present

## 2016-01-14 DIAGNOSIS — E876 Hypokalemia: Secondary | ICD-10-CM | POA: Diagnosis not present

## 2016-01-14 DIAGNOSIS — Z7952 Long term (current) use of systemic steroids: Secondary | ICD-10-CM | POA: Diagnosis not present

## 2016-01-14 DIAGNOSIS — G629 Polyneuropathy, unspecified: Secondary | ICD-10-CM | POA: Diagnosis not present

## 2016-01-16 DIAGNOSIS — Z79899 Other long term (current) drug therapy: Secondary | ICD-10-CM | POA: Diagnosis not present

## 2016-01-21 DIAGNOSIS — E876 Hypokalemia: Secondary | ICD-10-CM | POA: Diagnosis not present

## 2016-01-21 DIAGNOSIS — K5900 Constipation, unspecified: Secondary | ICD-10-CM | POA: Diagnosis not present

## 2016-01-21 DIAGNOSIS — R2689 Other abnormalities of gait and mobility: Secondary | ICD-10-CM | POA: Diagnosis not present

## 2016-01-21 DIAGNOSIS — J309 Allergic rhinitis, unspecified: Secondary | ICD-10-CM | POA: Diagnosis not present

## 2016-01-30 DIAGNOSIS — E876 Hypokalemia: Secondary | ICD-10-CM | POA: Diagnosis not present

## 2016-02-06 DIAGNOSIS — M79676 Pain in unspecified toe(s): Secondary | ICD-10-CM | POA: Diagnosis not present

## 2016-02-06 DIAGNOSIS — B351 Tinea unguium: Secondary | ICD-10-CM | POA: Diagnosis not present

## 2016-02-18 DIAGNOSIS — E876 Hypokalemia: Secondary | ICD-10-CM | POA: Diagnosis not present

## 2016-02-18 DIAGNOSIS — I10 Essential (primary) hypertension: Secondary | ICD-10-CM | POA: Diagnosis not present

## 2016-03-13 DIAGNOSIS — H6123 Impacted cerumen, bilateral: Secondary | ICD-10-CM | POA: Diagnosis not present

## 2016-03-13 DIAGNOSIS — H903 Sensorineural hearing loss, bilateral: Secondary | ICD-10-CM | POA: Diagnosis not present

## 2016-03-13 DIAGNOSIS — G309 Alzheimer's disease, unspecified: Secondary | ICD-10-CM | POA: Diagnosis not present

## 2016-03-17 DIAGNOSIS — G629 Polyneuropathy, unspecified: Secondary | ICD-10-CM | POA: Diagnosis not present

## 2016-03-17 DIAGNOSIS — K219 Gastro-esophageal reflux disease without esophagitis: Secondary | ICD-10-CM | POA: Diagnosis not present

## 2016-03-17 DIAGNOSIS — H1089 Other conjunctivitis: Secondary | ICD-10-CM | POA: Diagnosis not present

## 2016-03-31 DIAGNOSIS — D509 Iron deficiency anemia, unspecified: Secondary | ICD-10-CM | POA: Diagnosis not present

## 2016-03-31 DIAGNOSIS — R269 Unspecified abnormalities of gait and mobility: Secondary | ICD-10-CM | POA: Diagnosis not present

## 2016-03-31 DIAGNOSIS — K5900 Constipation, unspecified: Secondary | ICD-10-CM | POA: Diagnosis not present

## 2016-03-31 DIAGNOSIS — I10 Essential (primary) hypertension: Secondary | ICD-10-CM | POA: Diagnosis not present

## 2016-04-01 IMAGING — CR DG CHEST 1V PORT
1 series · 1 of 1 positions shown · non-contrast
Comparison: 12/27/2014

CLINICAL DATA: Pneumonia. Shortness of breath. Symptoms started
today.

EXAM:
PORTABLE CHEST - 1 VIEW

[AP]
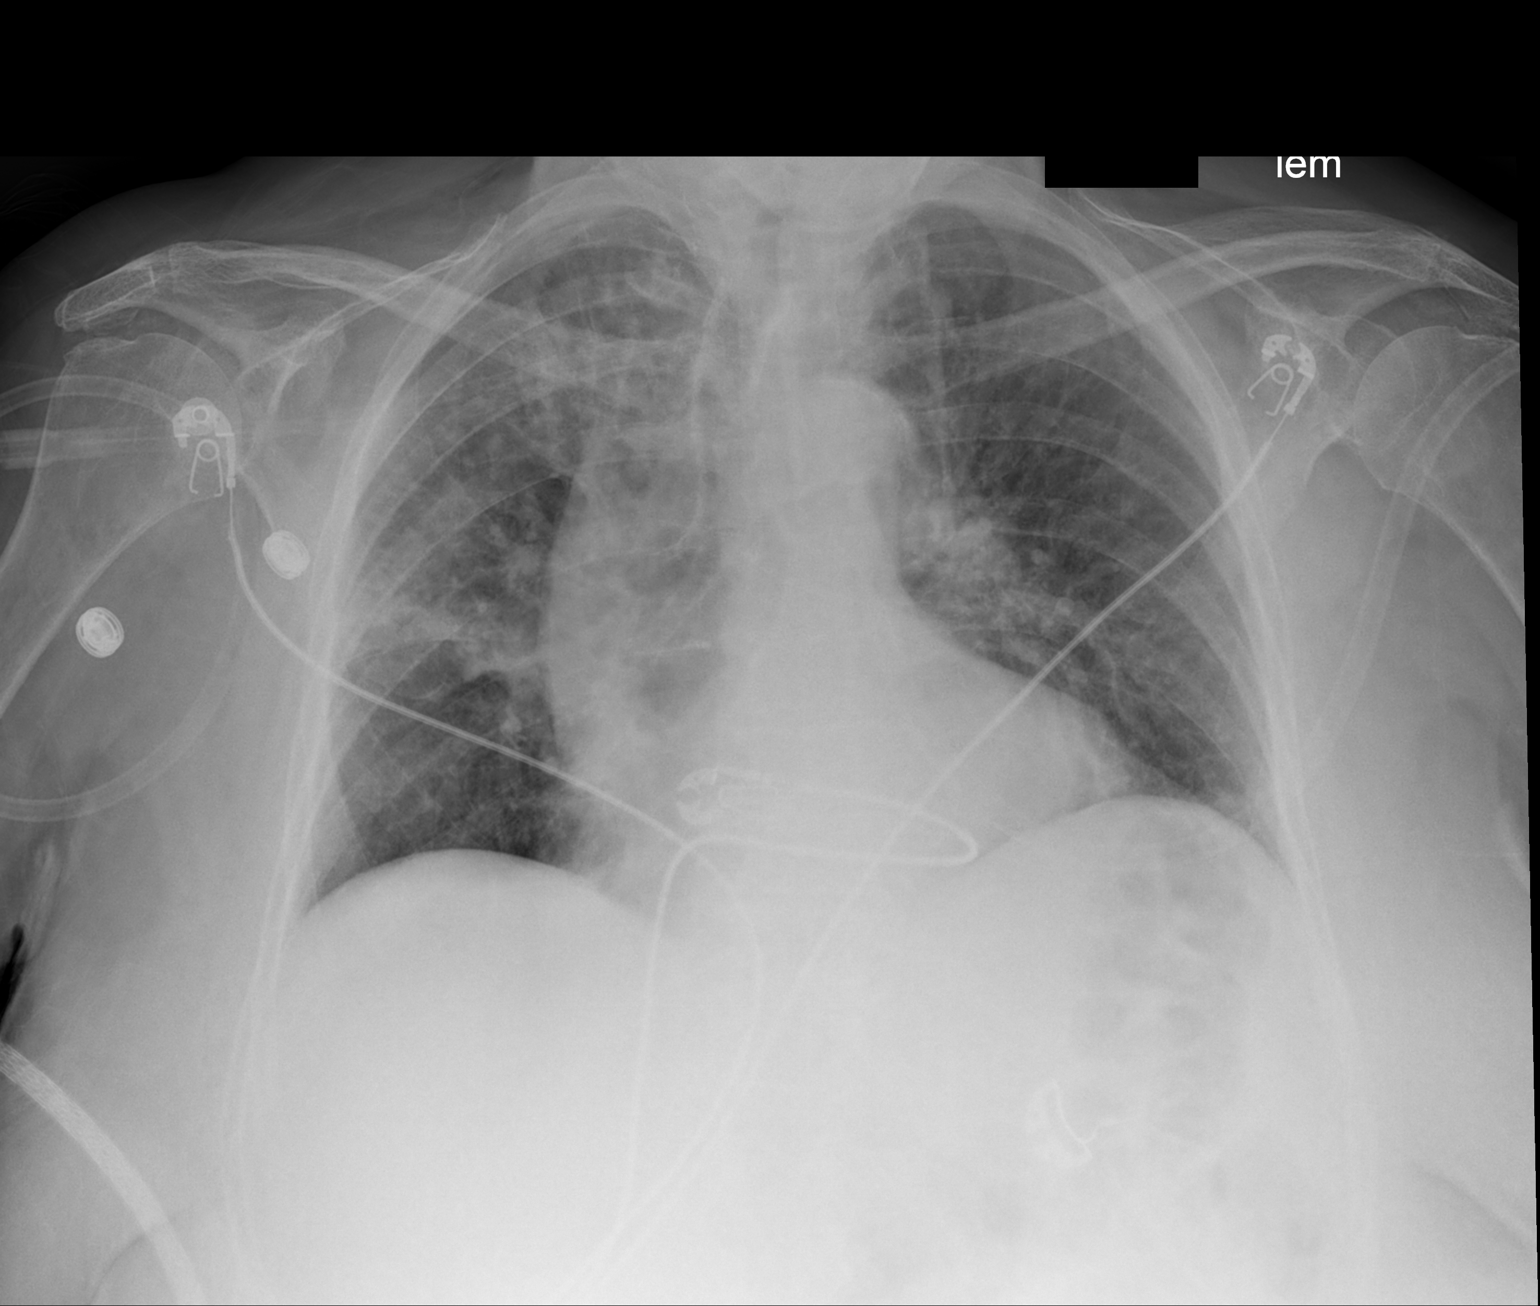

[1 of 1 positions shown; findings below may reference images not displayed]

FINDINGS: Bilateral indistinct pulmonary vasculature observed with hazy
opacities in the right upper lobe and potentially in the left upper
lobe.

The patient is rotated to the right on today's radiograph, reducing
diagnostic sensitivity and specificity. Atherosclerotic aortic arch
noted. Possible underlying hiatal hernia.
IMPRESSION: 1. Indistinct densities in the upper lobes, right greater than left,
potentially from aspiration pneumonitis, multilobar pneumonia, or
acute pulmonary edema. Upper normal heart size.
2. Retrocardiac density, possibly from a chronic hiatal hernia.
3. Rightward mediastinal prominence is primarily due to rightward
rotation.
4. Followup radiography to ensure clearance is recommended.

## 2016-04-09 DIAGNOSIS — M810 Age-related osteoporosis without current pathological fracture: Secondary | ICD-10-CM | POA: Diagnosis not present

## 2016-04-09 DIAGNOSIS — Z79899 Other long term (current) drug therapy: Secondary | ICD-10-CM | POA: Diagnosis not present

## 2016-04-15 DIAGNOSIS — M25551 Pain in right hip: Secondary | ICD-10-CM | POA: Diagnosis not present

## 2016-04-21 DIAGNOSIS — E559 Vitamin D deficiency, unspecified: Secondary | ICD-10-CM | POA: Diagnosis not present

## 2016-04-21 DIAGNOSIS — M1611 Unilateral primary osteoarthritis, right hip: Secondary | ICD-10-CM | POA: Diagnosis not present

## 2016-04-21 DIAGNOSIS — R739 Hyperglycemia, unspecified: Secondary | ICD-10-CM | POA: Diagnosis not present

## 2016-04-21 DIAGNOSIS — N179 Acute kidney failure, unspecified: Secondary | ICD-10-CM | POA: Diagnosis not present

## 2016-04-30 DIAGNOSIS — Z79899 Other long term (current) drug therapy: Secondary | ICD-10-CM | POA: Diagnosis not present

## 2016-04-30 DIAGNOSIS — R739 Hyperglycemia, unspecified: Secondary | ICD-10-CM | POA: Diagnosis not present

## 2016-05-19 DIAGNOSIS — E876 Hypokalemia: Secondary | ICD-10-CM | POA: Diagnosis not present

## 2016-05-19 DIAGNOSIS — D509 Iron deficiency anemia, unspecified: Secondary | ICD-10-CM | POA: Diagnosis not present

## 2016-05-19 DIAGNOSIS — R739 Hyperglycemia, unspecified: Secondary | ICD-10-CM | POA: Diagnosis not present

## 2016-05-19 DIAGNOSIS — J309 Allergic rhinitis, unspecified: Secondary | ICD-10-CM | POA: Diagnosis not present

## 2016-06-06 IMAGING — CT CT ABD-PELV W/O CM
2 of 4 series · 15 of 46 positions shown, 17 images · non-contrast
Comparison: No priors.

CLINICAL DATA: 85-year-old female with history of anemia and
generalize weakness. Evaluate for potential intra abdominal
pathology.

EXAM:
CT ABDOMEN AND PELVIS WITHOUT CONTRAST
TECHNIQUE: Multidetector CT imaging of the abdomen and pelvis was performed
following the standard protocol without IV contrast.

[Series 2: a/p w/o 5mm · axial · non-contrast · 0.71mm/px · z∈[-417,-27]mm · 12 of 90 slices shown, 14 images]
[im 8/90  soft-tissue]
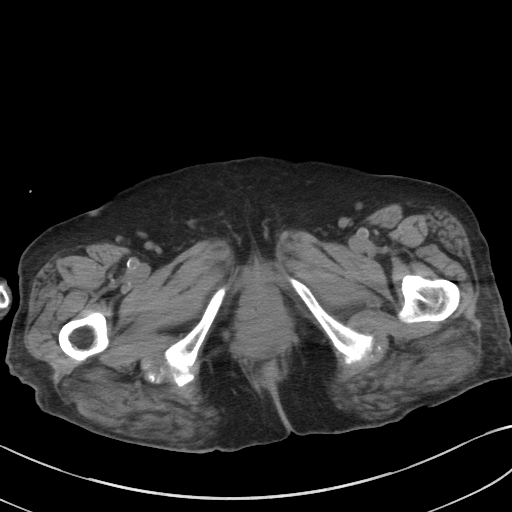
[im 8/90  bone]
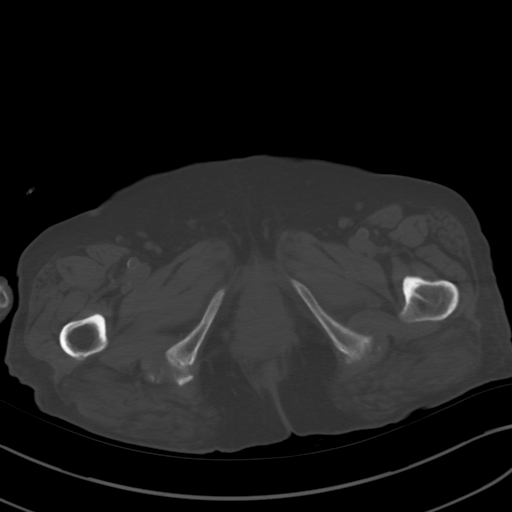
[im 15/90  soft-tissue]
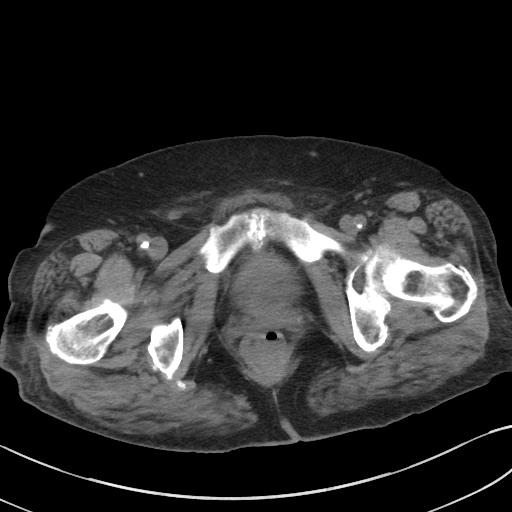
[im 22/90  soft-tissue]
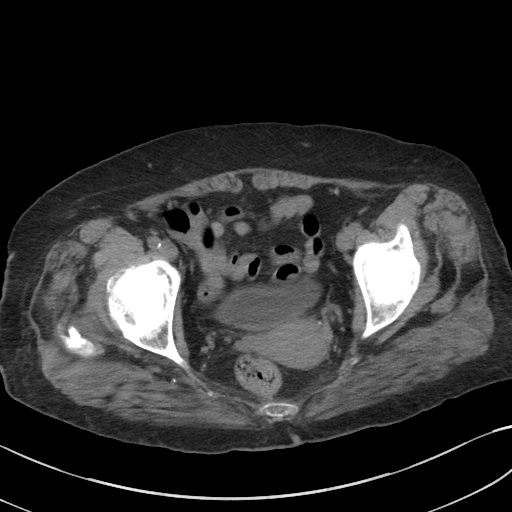
[im 29/90  soft-tissue]
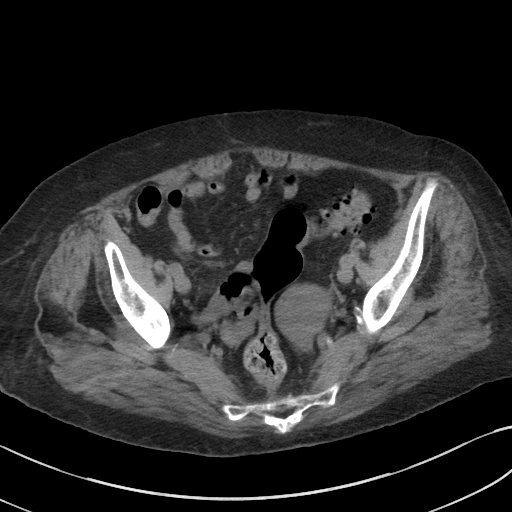
[im 36/90  soft-tissue]
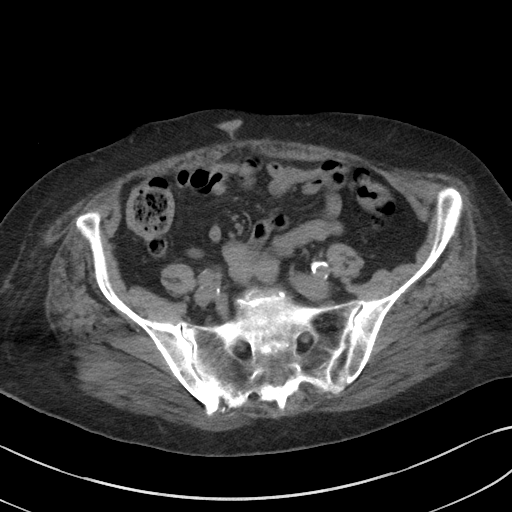
[im 43/90  soft-tissue]
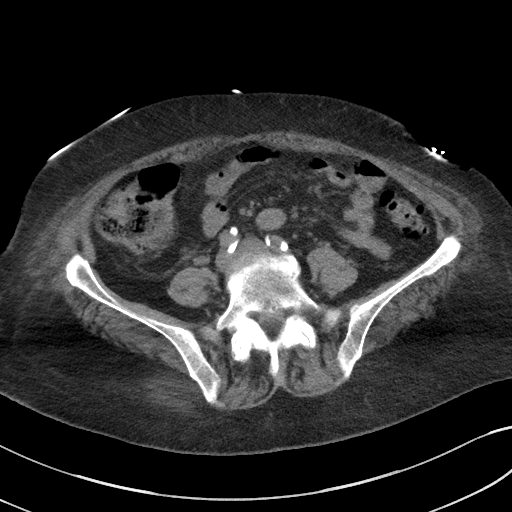
[im 50/90  soft-tissue]
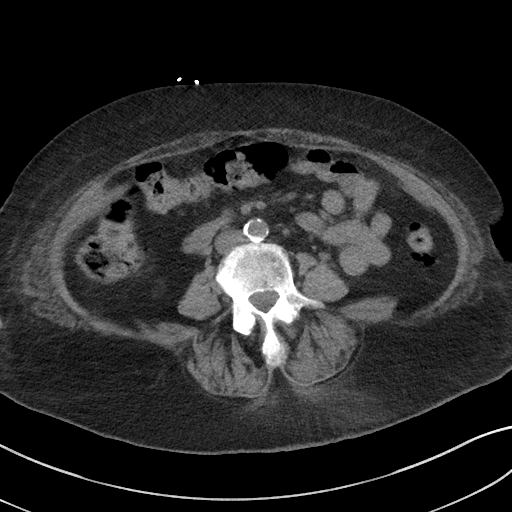
[im 57/90  soft-tissue]
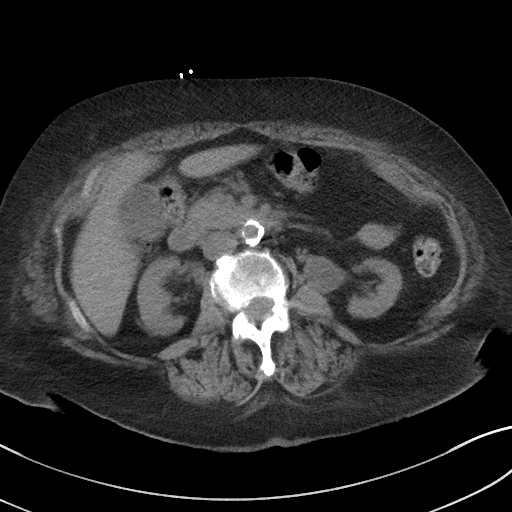
[im 65/90  soft-tissue]
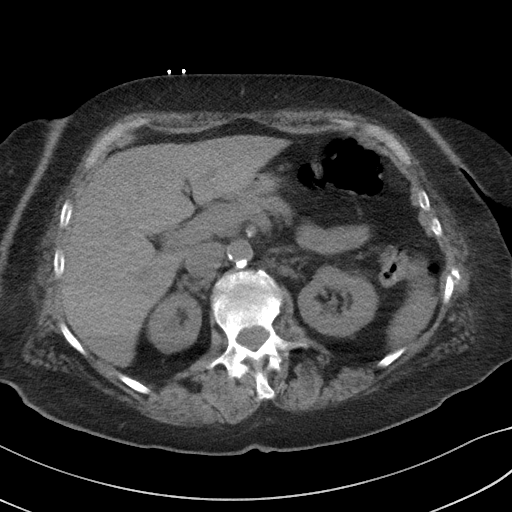
[im 65/90  bone]
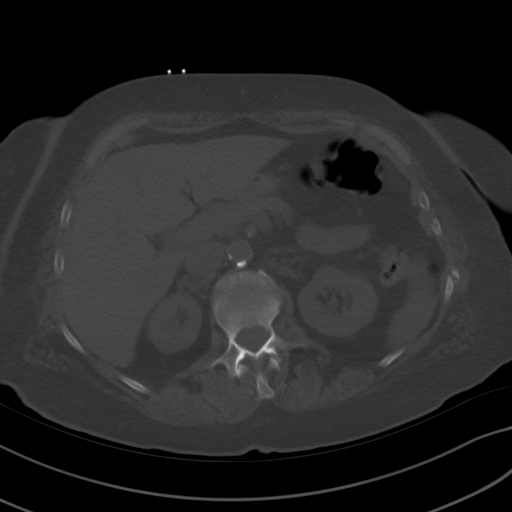
[im 72/90  soft-tissue]
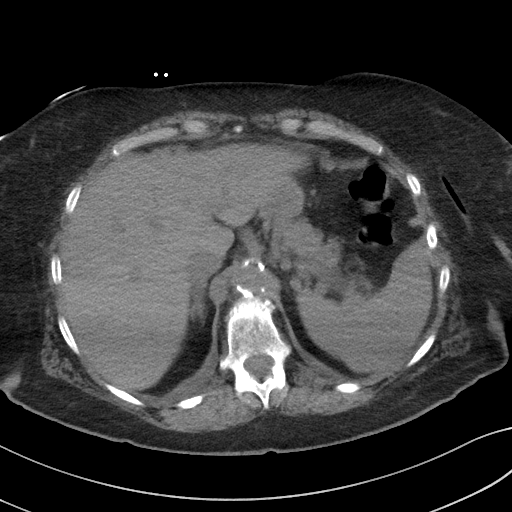
[im 79/90  soft-tissue]
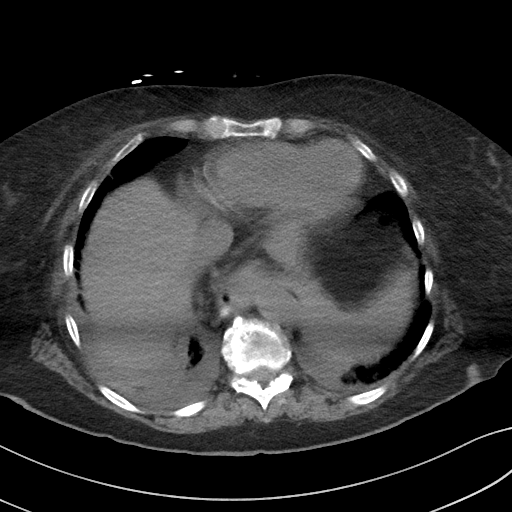
[im 86/90  soft-tissue]
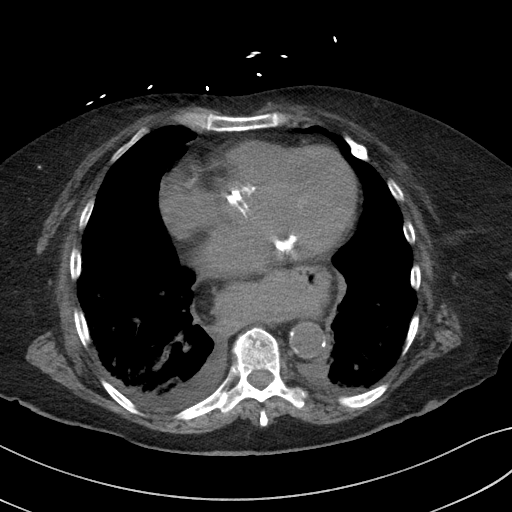

[Series 5: a/p w/o cor · coronal · non-contrast · 0.87mm/px · 3 of 122 slices shown]
[im 41/122  soft-tissue]
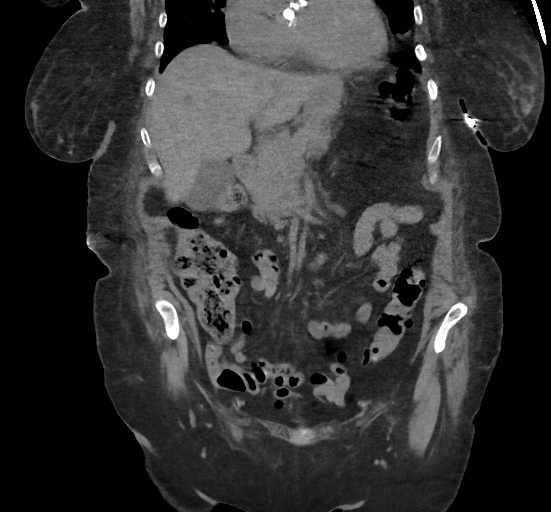
[im 54/122  soft-tissue]
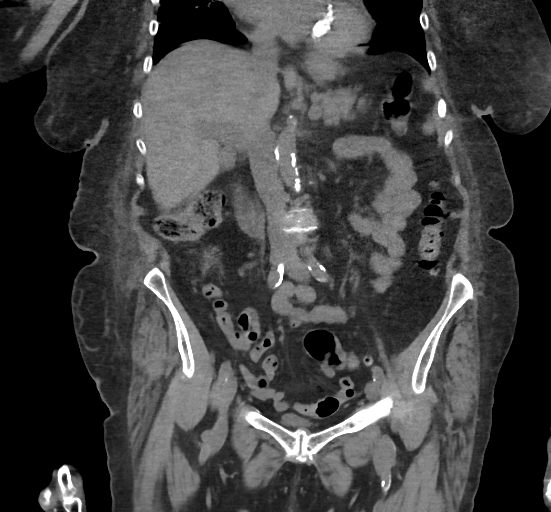
[im 68/122  soft-tissue]
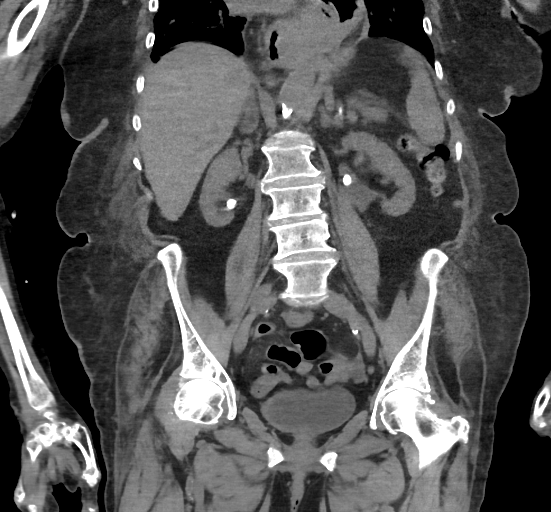

[15 of 46 positions shown; findings below may reference images not displayed]

FINDINGS: Lower chest: Small bilateral layering pleural effusions with
dependent areas of atelectasis. Mild interlobular septal thickening
may suggest a background of mild interstitial pulmonary edema. Heart
size is mildly enlarged. Severe calcifications of the aortic valve
and mitral annulus. Large hiatal hernia.

Hepatobiliary: 13 mm well-defined low-attenuation lesion in segment
4A of the liver is incompletely characterized on today's noncontrast
CT examination, but is statistically likely to represent a cyst. No
other suspicious hepatic lesions are noted on today's noncontrast CT
examination. Unenhanced appearance of the gallbladder is normal.

Pancreas: No pancreatic mass or peripancreatic inflammatory changes
on today's noncontrast CT examination.

Spleen: Unremarkable.

Adrenals/Urinary Tract: Unenhanced appearance of bilateral adrenal
glands is normal. Nonobstructive calculi are present within the
collecting systems of the kidneys bilaterally, the largest of which
is in the left renal pelvis measuring up to 11 mm. No ureteral or
bladder calculi identified. No hydroureteronephrosis at this time.
Bilateral kidneys are mildly atrophic. Unenhanced appearance of the
kidneys bilaterally is otherwise unremarkable. Unenhanced appearance
of the urinary bladder is normal.

Stomach/Bowel: Unenhanced appearance of the intra abdominal portion
of the stomach is normal. No pathologic dilatation of small bowel or
colon. Numerous colonic diverticulae, particularly in the descending
colon and sigmoid colon, without surrounding inflammatory changes to
suggest an acute diverticulitis at this time. Normal appendix.

Vascular/Lymphatic: Extensive atherosclerosis throughout the
abdominal and pelvic vasculature, without definite aneurysm. No
lymphadenopathy identified in the abdomen or pelvis on today's
noncontrast CT examination.

Reproductive: Unenhanced appearance of the uterus and ovaries is
unremarkable.

Other: No significant volume of ascites.  No pneumoperitoneum.

Musculoskeletal: There are no aggressive appearing lytic or blastic
lesions noted in the visualized portions of the skeleton.
IMPRESSION: 1. No acute findings in the abdomen or pelvis.
2. Colonic diverticulosis without evidence of acute diverticulitis
at this time.
3. Bilateral nonobstructive calculi in the collecting systems of the
kidneys, largest of which measures 11 mm in the left renal pelvis.
No ureteral stones or findings of urinary tract obstruction are
noted at this time.
4. Extensive atherosclerosis.
5. Normal appendix.
6. Large hiatal hernia.
7. The appearance of the lung bases suggest mild interstitial
pulmonary edema. There also small bilateral pleural effusions
layering dependently. These findings may reflect mild congestive
heart failure given the mild cardiomegaly.
8. There are calcifications of the aortic valve and mitral annulus.
Echocardiographic correlation for evaluation of potential valvular
dysfunction may be warranted if clinically indicated.

## 2016-06-26 DIAGNOSIS — S51851S Open bite of right forearm, sequela: Secondary | ICD-10-CM | POA: Diagnosis not present

## 2016-06-26 DIAGNOSIS — E876 Hypokalemia: Secondary | ICD-10-CM | POA: Diagnosis not present

## 2016-07-03 DIAGNOSIS — R040 Epistaxis: Secondary | ICD-10-CM | POA: Diagnosis not present

## 2016-07-03 DIAGNOSIS — R05 Cough: Secondary | ICD-10-CM | POA: Diagnosis not present

## 2016-07-04 DIAGNOSIS — R05 Cough: Secondary | ICD-10-CM | POA: Diagnosis not present

## 2016-07-07 DIAGNOSIS — Z23 Encounter for immunization: Secondary | ICD-10-CM | POA: Diagnosis not present

## 2016-07-22 DIAGNOSIS — R269 Unspecified abnormalities of gait and mobility: Secondary | ICD-10-CM | POA: Diagnosis not present

## 2016-07-22 DIAGNOSIS — Z9189 Other specified personal risk factors, not elsewhere classified: Secondary | ICD-10-CM | POA: Diagnosis not present

## 2016-08-06 DIAGNOSIS — D509 Iron deficiency anemia, unspecified: Secondary | ICD-10-CM | POA: Diagnosis not present

## 2016-08-06 DIAGNOSIS — D519 Vitamin B12 deficiency anemia, unspecified: Secondary | ICD-10-CM | POA: Diagnosis not present

## 2016-08-06 DIAGNOSIS — D649 Anemia, unspecified: Secondary | ICD-10-CM | POA: Diagnosis not present

## 2016-08-06 DIAGNOSIS — Z79899 Other long term (current) drug therapy: Secondary | ICD-10-CM | POA: Diagnosis not present

## 2016-08-06 DIAGNOSIS — R739 Hyperglycemia, unspecified: Secondary | ICD-10-CM | POA: Diagnosis not present

## 2016-08-12 DIAGNOSIS — W19XXXS Unspecified fall, sequela: Secondary | ICD-10-CM | POA: Diagnosis not present

## 2016-08-12 DIAGNOSIS — R54 Age-related physical debility: Secondary | ICD-10-CM | POA: Diagnosis not present

## 2016-08-12 DIAGNOSIS — Z9189 Other specified personal risk factors, not elsewhere classified: Secondary | ICD-10-CM | POA: Diagnosis not present

## 2016-08-12 DIAGNOSIS — N179 Acute kidney failure, unspecified: Secondary | ICD-10-CM | POA: Diagnosis not present

## 2016-08-13 DIAGNOSIS — R3915 Urgency of urination: Secondary | ICD-10-CM | POA: Diagnosis not present

## 2016-08-15 DIAGNOSIS — N399 Disorder of urinary system, unspecified: Secondary | ICD-10-CM | POA: Diagnosis not present

## 2016-08-15 DIAGNOSIS — Z9181 History of falling: Secondary | ICD-10-CM | POA: Diagnosis not present

## 2016-08-15 DIAGNOSIS — R269 Unspecified abnormalities of gait and mobility: Secondary | ICD-10-CM | POA: Diagnosis not present

## 2016-08-15 DIAGNOSIS — R399 Unspecified symptoms and signs involving the genitourinary system: Secondary | ICD-10-CM | POA: Diagnosis not present

## 2016-08-18 DIAGNOSIS — R269 Unspecified abnormalities of gait and mobility: Secondary | ICD-10-CM | POA: Diagnosis not present

## 2016-08-18 DIAGNOSIS — R399 Unspecified symptoms and signs involving the genitourinary system: Secondary | ICD-10-CM | POA: Diagnosis not present

## 2016-08-18 DIAGNOSIS — Z9181 History of falling: Secondary | ICD-10-CM | POA: Diagnosis not present

## 2016-08-22 DIAGNOSIS — R269 Unspecified abnormalities of gait and mobility: Secondary | ICD-10-CM | POA: Diagnosis not present

## 2016-08-22 DIAGNOSIS — R399 Unspecified symptoms and signs involving the genitourinary system: Secondary | ICD-10-CM | POA: Diagnosis not present

## 2016-08-22 DIAGNOSIS — Z9181 History of falling: Secondary | ICD-10-CM | POA: Diagnosis not present

## 2016-08-30 DIAGNOSIS — Z9181 History of falling: Secondary | ICD-10-CM | POA: Diagnosis not present

## 2016-08-30 DIAGNOSIS — R269 Unspecified abnormalities of gait and mobility: Secondary | ICD-10-CM | POA: Diagnosis not present

## 2016-08-30 DIAGNOSIS — R399 Unspecified symptoms and signs involving the genitourinary system: Secondary | ICD-10-CM | POA: Diagnosis not present

## 2016-09-03 DIAGNOSIS — R399 Unspecified symptoms and signs involving the genitourinary system: Secondary | ICD-10-CM | POA: Diagnosis not present

## 2016-09-03 DIAGNOSIS — R269 Unspecified abnormalities of gait and mobility: Secondary | ICD-10-CM | POA: Diagnosis not present

## 2016-09-03 DIAGNOSIS — Z9181 History of falling: Secondary | ICD-10-CM | POA: Diagnosis not present

## 2016-09-06 DIAGNOSIS — R269 Unspecified abnormalities of gait and mobility: Secondary | ICD-10-CM | POA: Diagnosis not present

## 2016-09-06 DIAGNOSIS — R399 Unspecified symptoms and signs involving the genitourinary system: Secondary | ICD-10-CM | POA: Diagnosis not present

## 2016-09-06 DIAGNOSIS — Z9181 History of falling: Secondary | ICD-10-CM | POA: Diagnosis not present

## 2016-09-16 DIAGNOSIS — R269 Unspecified abnormalities of gait and mobility: Secondary | ICD-10-CM | POA: Diagnosis not present

## 2016-09-16 DIAGNOSIS — H6123 Impacted cerumen, bilateral: Secondary | ICD-10-CM | POA: Diagnosis not present

## 2016-09-16 DIAGNOSIS — R399 Unspecified symptoms and signs involving the genitourinary system: Secondary | ICD-10-CM | POA: Diagnosis not present

## 2016-09-16 DIAGNOSIS — H903 Sensorineural hearing loss, bilateral: Secondary | ICD-10-CM | POA: Diagnosis not present

## 2016-09-16 DIAGNOSIS — Z9181 History of falling: Secondary | ICD-10-CM | POA: Diagnosis not present

## 2016-09-16 DIAGNOSIS — G309 Alzheimer's disease, unspecified: Secondary | ICD-10-CM | POA: Diagnosis not present

## 2016-09-18 DIAGNOSIS — R269 Unspecified abnormalities of gait and mobility: Secondary | ICD-10-CM | POA: Diagnosis not present

## 2016-09-18 DIAGNOSIS — Z9181 History of falling: Secondary | ICD-10-CM | POA: Diagnosis not present

## 2016-09-18 DIAGNOSIS — R399 Unspecified symptoms and signs involving the genitourinary system: Secondary | ICD-10-CM | POA: Diagnosis not present

## 2016-12-10 DIAGNOSIS — Z79899 Other long term (current) drug therapy: Secondary | ICD-10-CM | POA: Diagnosis not present

## 2016-12-10 DIAGNOSIS — R739 Hyperglycemia, unspecified: Secondary | ICD-10-CM | POA: Diagnosis not present

## 2017-01-21 DIAGNOSIS — G4700 Insomnia, unspecified: Secondary | ICD-10-CM | POA: Diagnosis not present

## 2017-01-21 DIAGNOSIS — K5900 Constipation, unspecified: Secondary | ICD-10-CM | POA: Diagnosis not present

## 2017-01-21 DIAGNOSIS — E876 Hypokalemia: Secondary | ICD-10-CM | POA: Diagnosis not present

## 2017-01-21 DIAGNOSIS — Z79899 Other long term (current) drug therapy: Secondary | ICD-10-CM | POA: Diagnosis not present

## 2017-02-23 DIAGNOSIS — Z79899 Other long term (current) drug therapy: Secondary | ICD-10-CM | POA: Diagnosis not present

## 2017-03-16 ENCOUNTER — Emergency Department (HOSPITAL_COMMUNITY): Payer: Medicare Other

## 2017-03-16 ENCOUNTER — Emergency Department (HOSPITAL_COMMUNITY)
Admission: EM | Admit: 2017-03-16 | Discharge: 2017-03-16 | Disposition: A | Discharge status: Home / Self Care | Payer: Medicare Other | Attending: Emergency Medicine | Admitting: Emergency Medicine

## 2017-03-16 ENCOUNTER — Encounter (HOSPITAL_COMMUNITY): Payer: Self-pay | Admitting: Emergency Medicine

## 2017-03-16 DIAGNOSIS — S4992XA Unspecified injury of left shoulder and upper arm, initial encounter: Secondary | ICD-10-CM | POA: Diagnosis not present

## 2017-03-16 DIAGNOSIS — Y999 Unspecified external cause status: Secondary | ICD-10-CM | POA: Diagnosis not present

## 2017-03-16 DIAGNOSIS — W19XXXA Unspecified fall, initial encounter: Secondary | ICD-10-CM | POA: Diagnosis not present

## 2017-03-16 DIAGNOSIS — S199XXA Unspecified injury of neck, initial encounter: Secondary | ICD-10-CM | POA: Diagnosis not present

## 2017-03-16 DIAGNOSIS — N3001 Acute cystitis with hematuria: Secondary | ICD-10-CM | POA: Diagnosis not present

## 2017-03-16 DIAGNOSIS — I10 Essential (primary) hypertension: Secondary | ICD-10-CM | POA: Diagnosis not present

## 2017-03-16 DIAGNOSIS — Y939 Activity, unspecified: Secondary | ICD-10-CM | POA: Insufficient documentation

## 2017-03-16 DIAGNOSIS — Z87891 Personal history of nicotine dependence: Secondary | ICD-10-CM | POA: Diagnosis not present

## 2017-03-16 DIAGNOSIS — Z79899 Other long term (current) drug therapy: Secondary | ICD-10-CM | POA: Diagnosis not present

## 2017-03-16 DIAGNOSIS — S50312A Abrasion of left elbow, initial encounter: Secondary | ICD-10-CM | POA: Diagnosis not present

## 2017-03-16 DIAGNOSIS — Y92129 Unspecified place in nursing home as the place of occurrence of the external cause: Secondary | ICD-10-CM | POA: Diagnosis not present

## 2017-03-16 DIAGNOSIS — Z23 Encounter for immunization: Secondary | ICD-10-CM | POA: Insufficient documentation

## 2017-03-16 DIAGNOSIS — N390 Urinary tract infection, site not specified: Secondary | ICD-10-CM | POA: Diagnosis not present

## 2017-03-16 DIAGNOSIS — M25512 Pain in left shoulder: Secondary | ICD-10-CM | POA: Diagnosis not present

## 2017-03-16 DIAGNOSIS — S6980XA Other specified injuries of unspecified wrist, hand and finger(s), initial encounter: Secondary | ICD-10-CM | POA: Diagnosis not present

## 2017-03-16 DIAGNOSIS — S0990XA Unspecified injury of head, initial encounter: Secondary | ICD-10-CM | POA: Diagnosis not present

## 2017-03-16 DIAGNOSIS — M25552 Pain in left hip: Secondary | ICD-10-CM | POA: Diagnosis not present

## 2017-03-16 DIAGNOSIS — M25422 Effusion, left elbow: Secondary | ICD-10-CM | POA: Diagnosis not present

## 2017-03-16 DIAGNOSIS — M25522 Pain in left elbow: Secondary | ICD-10-CM | POA: Diagnosis not present

## 2017-03-16 DIAGNOSIS — S79912A Unspecified injury of left hip, initial encounter: Secondary | ICD-10-CM | POA: Diagnosis not present

## 2017-03-16 DIAGNOSIS — T148XXA Other injury of unspecified body region, initial encounter: Secondary | ICD-10-CM

## 2017-03-16 LAB — BASIC METABOLIC PANEL WITH GFR
Anion gap: 8 (ref 5–15)
BUN: 23 mg/dL — ABNORMAL HIGH (ref 6–20)
CO2: 28 mmol/L (ref 22–32)
Calcium: 10.2 mg/dL (ref 8.9–10.3)
Chloride: 105 mmol/L (ref 101–111)
Creatinine, Ser: 0.99 mg/dL (ref 0.44–1.00)
GFR calc Af Amer: 59 mL/min — ABNORMAL LOW
GFR calc non Af Amer: 51 mL/min — ABNORMAL LOW
Glucose, Bld: 120 mg/dL — ABNORMAL HIGH (ref 65–99)
Potassium: 3.7 mmol/L (ref 3.5–5.1)
Sodium: 141 mmol/L (ref 135–145)

## 2017-03-16 LAB — CBC WITH DIFFERENTIAL/PLATELET
Basophils Absolute: 0 10*3/uL (ref 0.0–0.1)
Basophils Relative: 0 %
EOS ABS: 0 10*3/uL (ref 0.0–0.7)
Eosinophils Relative: 0 %
HEMATOCRIT: 44.6 % (ref 36.0–46.0)
HEMOGLOBIN: 15.2 g/dL — AB (ref 12.0–15.0)
LYMPHS ABS: 1.1 10*3/uL (ref 0.7–4.0)
LYMPHS PCT: 9 %
MCH: 30.2 pg (ref 26.0–34.0)
MCHC: 34.1 g/dL (ref 30.0–36.0)
MCV: 88.5 fL (ref 78.0–100.0)
MONOS PCT: 6 %
Monocytes Absolute: 0.7 10*3/uL (ref 0.1–1.0)
NEUTROS PCT: 85 %
Neutro Abs: 10.4 10*3/uL — ABNORMAL HIGH (ref 1.7–7.7)
Platelets: 214 10*3/uL (ref 150–400)
RBC: 5.04 MIL/uL (ref 3.87–5.11)
RDW: 14.3 % (ref 11.5–15.5)
WBC: 12.2 10*3/uL — AB (ref 4.0–10.5)

## 2017-03-16 LAB — URINALYSIS, ROUTINE W REFLEX MICROSCOPIC
Bacteria, UA: NONE SEEN
Bilirubin Urine: NEGATIVE
Glucose, UA: NEGATIVE mg/dL
Ketones, ur: NEGATIVE mg/dL
Nitrite: NEGATIVE
Protein, ur: 100 mg/dL — AB
Specific Gravity, Urine: 1.014 (ref 1.005–1.030)
pH: 6 (ref 5.0–8.0)

## 2017-03-16 MED ORDER — CEPHALEXIN 250 MG PO CAPS
250.0000 mg | ORAL_CAPSULE | Freq: Once | ORAL | Status: AC
  Administered 2017-03-16: 250 mg via ORAL
  Filled 2017-03-16: qty 1

## 2017-03-16 MED ORDER — BACITRACIN ZINC 500 UNIT/GM EX OINT
1.0000 "application " | TOPICAL_OINTMENT | Freq: Once | CUTANEOUS | Status: AC
  Administered 2017-03-16: 1 via TOPICAL
  Filled 2017-03-16: qty 1.8

## 2017-03-16 MED ORDER — TETANUS-DIPHTH-ACELL PERTUSSIS 5-2.5-18.5 LF-MCG/0.5 IM SUSP
0.5000 mL | Freq: Once | INTRAMUSCULAR | Status: AC
  Administered 2017-03-16: 0.5 mL via INTRAMUSCULAR
  Filled 2017-03-16: qty 0.5

## 2017-03-16 MED ORDER — CEPHALEXIN 250 MG PO CAPS: 250.0000 mg | ORAL_CAPSULE | Freq: Two times a day (BID) | ORAL | 0 refills | Status: DC

## 2017-03-16 MED ORDER — ACETAMINOPHEN 325 MG PO TABS
650.0000 mg | ORAL_TABLET | Freq: Once | ORAL | Status: AC
  Administered 2017-03-16: 650 mg via ORAL
  Filled 2017-03-16: qty 2

## 2017-03-16 NOTE — Discharge Instructions (Signed)
Take the medications as prescribed, follow-up with your primary care doctor, return for fever or worsening symptoms

## 2017-03-16 NOTE — ED Triage Notes (Signed)
Per EMS-unwitnessed fall-patient found by staff prone on floor-no LOC-complaining of left shoulder and elbow pain-no deformity, skin tear to left elbow-at baseline mentally per staff at [facility]

## 2017-03-16 NOTE — ED Notes (Signed)
[facility] called for transport.  

## 2017-03-16 NOTE — ED Notes (Signed)
Patient transported to CT/XR ?

## 2017-03-16 NOTE — ED Notes (Signed)
Bed: [facility] Expected date:  Expected time:  Means of arrival:  Comments: 

## 2017-03-16 NOTE — ED Notes (Signed)
Pt alert and states she feels fine but has left upper arm pain.

## 2017-03-16 NOTE — ED Notes (Signed)
Patient given a sandwich and drink 

## 2017-03-16 NOTE — ED Provider Notes (Signed)
[facility] DEPT Provider Note   CSN: [id number] Arrival date & time: 03/16/17  0932     History   Chief Complaint Chief Complaint  Patient presents with  . Fall    HPI Jamie [name] is a 83 y.o. female.  HPI The patient presented to the emergency room for evaluation of injuries associated with the fall. Patient is a resident of a nursing facility.  Patient was found on the floor lying prone. She denied losing consciousness. The patient was complaining of pain in her left shoulder and left elbow. Patient denies any trouble with any headache. She denies any chest pain or shortness of breath. No abdominal pain. No fevers or chills. She does complain of pain moving her elbow and her left shoulder Past Medical History:  Diagnosis Date  . Anxiety   . Chronic pain   . Depression   . Gastric ulcer 05/2011  . Hyperlipidemia   . Hypertension   . Obesity    5'3"      Past Surgical History:  Procedure Laterality Date  . ABDOMINAL HYSTERECTOMY     PARTIAL  . BILATERAL OOPHORECTOMY    . ESOPHAGOGASTRODUODENOSCOPY  08/12/2012   Procedure: ESOPHAGOGASTRODUODENOSCOPY (EGD);  Surgeon: Arta Silence, MD;  Location: [facility];  Service: Endoscopy;  Laterality: Left;  . ESOPHAGOGASTRODUODENOSCOPY (EGD) WITH PROPOFOL N/A 12/28/2014   Procedure: ESOPHAGOGASTRODUODENOSCOPY (EGD) WITH PROPOFOL;  Surgeon: Wilford Corner, MD;  Location: [facility];  Service: Endoscopy;  Laterality: N/A;  . skin biopsy     nose lesion, noncancerous    OB History    No data available       Home Medications    Prior to Admission medications   Medication Sig Start Date End Date Taking? Authorizing Provider  acetaminophen (TYLENOL) 325 MG tablet Take 650 mg by mouth every 4 (four) hours as needed for moderate pain.   Yes [provider]  acetaminophen (TYLENOL) 650 MG suppository Place 650 mg rectally every 4 (four) hours as needed for moderate pain or fever.   Yes [provider]    ARIPiprazole (ABILIFY) 5 MG tablet Take 1 tablet (5 mg total) by mouth at bedtime. 06/24/15  Yes Charlynne Cousins, MD  calcium carbonate (OS-CAL - DOSED IN MG OF ELEMENTAL CALCIUM) 1250 (500 Ca) MG tablet Take 1 tablet by mouth 2 (two) times daily with a meal.   Yes [provider]  furosemide (LASIX) 40 MG tablet Take 1 tablet (40 mg total) by mouth daily. 06/24/15  Yes Charlynne Cousins, MD  gabapentin (NEURONTIN) 100 MG capsule Take 1 capsule (100 mg total) by mouth at bedtime. 06/24/15  Yes Charlynne Cousins, MD  Hypromellose (ISOPTO TEARS) 0.5 % SOLN Apply 1 drop to eye 3 (three) times daily. One drop in each eye, three times daily. Patient taking differently: Place 1 drop into both eyes 3 (three) times daily.  06/24/15  Yes Charlynne Cousins, MD  magnesium oxide (MAG-OX) 400 MG tablet Take 400 mg by mouth daily.   Yes [provider]  Melatonin 1 MG TABS Take 1 mg by mouth at bedtime.    Yes [provider]  olopatadine (PATANOL) 0.1 % ophthalmic solution Place 1 drop into both eyes 2 (two) times daily. 06/24/15  Yes Charlynne Cousins, MD  pantoprazole (PROTONIX) 40 MG tablet Take 1 tablet (40 mg total) by mouth 2 (two) times daily before a meal. Patient taking differently: Take 40 mg by mouth daily.  06/24/15  Yes Aileen Fass,  Tammi Klippel, MD  polyethylene glycol [facility] / GLYCOLAX) packet Take 17 g by mouth daily. 06/24/15  Yes Charlynne Cousins, MD  potassium chloride SA (K-DUR,KLOR-CON) 20 MEQ tablet Take 60 mEq by mouth daily.   Yes [provider]  senna (SENOKOT) 8.6 MG TABS tablet Take 1 tablet (8.6 mg total) by mouth at bedtime. 06/24/15  Yes Charlynne Cousins, MD  sertraline (ZOLOFT) 50 MG tablet Take 1 tablet (50 mg total) by mouth daily. 06/24/15  Yes Charlynne Cousins, MD  cephALEXin (KEFLEX) 250 MG capsule Take 1 capsule (250 mg total) by mouth 2 (two) times daily. 03/16/17   Dorie Rank, MD  fluticasone (FLONASE) 50  MCG/ACT nasal spray Place 2 sprays into both nostrils daily. Patient not taking: Reported on 03/16/2017 06/24/15   Charlynne Cousins, MD  potassium chloride (K-DUR) 10 MEQ tablet Take 2 tablets (20 mEq total) by mouth daily. Patient not taking: Reported on 03/16/2017 06/24/15   Charlynne Cousins, MD  predniSONE (DELTASONE) 5 MG tablet Take 1 tablet (5 mg total) by mouth daily with breakfast. Patient not taking: Reported on 03/16/2017 06/24/15   Charlynne Cousins, MD  risperiDONE (RISPERDAL) 0.5 MG tablet Take 1-2 tablets (0.5-1 mg total) by mouth 2 (two) times daily. 1 tab in the morning and 2 tabs at bedtime Patient not taking: Reported on 03/16/2017 06/24/15   Charlynne Cousins, MD  risperiDONE (RISPERDAL) 1 MG tablet Take 1 tablet (1 mg total) by mouth at bedtime. Patient not taking: Reported on 03/16/2017 06/24/15   Charlynne Cousins, MD    Family History Family History  Problem Relation Age of Onset  . Hypertension Mother   . Hyperlipidemia Mother   . Heart disease Father        49s  . Cancer Brother     Social History Social History  Substance Use Topics  . Smoking status: Former Smoker    Packs/day: 1.00    Types: Cigarettes    Quit date: 09/02/1971  . Smokeless tobacco: Never Used  . Alcohol use Yes     Comment: occasionally     Allergies   Pollen extract   Review of Systems Review of Systems  All other systems reviewed and are negative.    Physical Exam Updated Vital Signs BP 114/69   Pulse 83   Temp 98 F (36.7 C) (Oral)   Resp 17   SpO2 97%   Physical Exam  Constitutional: No distress.  Elderly frail, smells of urine  HENT:  Head: Normocephalic and atraumatic.  Right Ear: External ear normal.  Left Ear: External ear normal.  Eyes: Conjunctivae are normal. Right eye exhibits no discharge. Left eye exhibits no discharge. No scleral icterus.  Neck: Neck supple. No tracheal deviation present.  Patient had a towel applied around her neck by  EMS  Cardiovascular: Normal rate, regular rhythm and intact distal pulses.   Pulmonary/Chest: Effort normal and breath sounds normal. No stridor. No respiratory distress. She has no wheezes. She has no rales.  Abdominal: Soft. Bowel sounds are normal. She exhibits no distension. There is no tenderness. There is no rebound and no guarding.  Musculoskeletal: She exhibits no edema or tenderness.       Left hip: She exhibits no tenderness.  Mild pain with range of motion of the left hip, no pain when moving the right hip; tenderness palpation left shoulder and left elbow; superficial skin tear noted around the left elbow  Neurological: She is alert. She has  normal strength. No cranial nerve deficit (no facial droop, extraocular movements intact, no slurred speech) or sensory deficit. She exhibits normal muscle tone. She displays no seizure activity. Coordination normal.  Skin: Skin is warm and dry. No rash noted.  Psychiatric: She has a normal mood and affect.  Nursing note and vitals reviewed.    ED Treatments / Results  Labs (all labs ordered are listed, but only abnormal results are displayed) Labs Reviewed  BASIC METABOLIC PANEL - Abnormal; Notable for the following:       Result Value   Glucose, Bld 120 (*)    BUN 23 (*)    GFR calc non Af Amer 51 (*)    GFR calc Af Amer 59 (*)    All other components within normal limits  CBC WITH DIFFERENTIAL/PLATELET - Abnormal; Notable for the following:    WBC 12.2 (*)    Hemoglobin 15.2 (*)    Neutro Abs 10.4 (*)    All other components within normal limits  URINALYSIS, ROUTINE W REFLEX MICROSCOPIC - Abnormal; Notable for the following:    APPearance HAZY (*)    Hgb urine dipstick LARGE (*)    Protein, ur 100 (*)    Leukocytes, UA TRACE (*)    Squamous Epithelial / LPF 0-5 (*)    All other components within normal limits  URINE CULTURE     Radiology Dg Elbow Complete Left  Result Date: 03/16/2017 CLINICAL DATA:  Fall this morning  with posterior skin tear. Initial encounter. EXAM: LEFT ELBOW - COMPLETE 3+ VIEW COMPARISON:  None. FINDINGS: Limited lateral view, with radial head overlapping the capitellum, but adequate for excluding joint effusion and confirming located joint. There is no opaque foreign body. Negative for fracture. Osteopenia. IMPRESSION: Negative. Electronically Signed   By: Monte Fantasia M.D.   On: 03/16/2017 11:10   Ct Head Wo Contrast  Result Date: 03/16/2017 CLINICAL DATA:  Fall with head injury. Initial encounter. EXAM: CT HEAD WITHOUT CONTRAST CT CERVICAL SPINE WITHOUT CONTRAST TECHNIQUE: Multidetector CT imaging of the head and cervical spine was performed following the standard protocol without intravenous contrast. Multiplanar CT image reconstructions of the cervical spine were also generated. COMPARISON:  None. FINDINGS: CT HEAD FINDINGS Brain: No evidence of acute infarction, hemorrhage, hydrocephalus, extra-axial collection or mass lesion/mass effect. There is cerebral volume loss most notable in the medial temporal lobes. Moderate for age chronic microvascular ischemic change in the cerebral white matter. Vascular: Atherosclerotic calcification.  No hyperdense vessel. Skull: No acute or aggressive finding. Sinuses/Orbits: No acute finding.  Right cataract resection. CT CERVICAL SPINE FINDINGS Alignment: No traumatic malalignment. Mild facet mediated anterolisthesis at C4-5 and C7-T1. Skull base and vertebrae: Negative fracture Soft tissues and spinal canal: No prevertebral fluid or swelling. No visible canal hematoma. Disc levels: Diffuse facet arthropathy. Advanced C5-6 and C6-7 disc narrowing with endplate ridging. Upper chest: No acute finding IMPRESSION: No evidence of intracranial or cervical spine injury. Electronically Signed   By: Monte Fantasia M.D.   On: 03/16/2017 11:42   Ct Cervical Spine Wo Contrast  Result Date: 03/16/2017 CLINICAL DATA:  Fall with head injury. Initial encounter. EXAM: CT  HEAD WITHOUT CONTRAST CT CERVICAL SPINE WITHOUT CONTRAST TECHNIQUE: Multidetector CT imaging of the head and cervical spine was performed following the standard protocol without intravenous contrast. Multiplanar CT image reconstructions of the cervical spine were also generated. COMPARISON:  None. FINDINGS: CT HEAD FINDINGS Brain: No evidence of acute infarction, hemorrhage, hydrocephalus, extra-axial collection or mass lesion/mass  effect. There is cerebral volume loss most notable in the medial temporal lobes. Moderate for age chronic microvascular ischemic change in the cerebral white matter. Vascular: Atherosclerotic calcification.  No hyperdense vessel. Skull: No acute or aggressive finding. Sinuses/Orbits: No acute finding.  Right cataract resection. CT CERVICAL SPINE FINDINGS Alignment: No traumatic malalignment. Mild facet mediated anterolisthesis at C4-5 and C7-T1. Skull base and vertebrae: Negative fracture Soft tissues and spinal canal: No prevertebral fluid or swelling. No visible canal hematoma. Disc levels: Diffuse facet arthropathy. Advanced C5-6 and C6-7 disc narrowing with endplate ridging. Upper chest: No acute finding IMPRESSION: No evidence of intracranial or cervical spine injury. Electronically Signed   By: Monte Fantasia M.D.   On: 03/16/2017 11:42   Dg Shoulder Left  Result Date: 03/16/2017 CLINICAL DATA:  Fall, pain. EXAM: LEFT SHOULDER - 2+ VIEW COMPARISON:  None. FINDINGS: There is no evidence of fracture or dislocation. There is no evidence of erosive arthropathy or other focal bone abnormality. Soft tissues are unremarkable. IMPRESSION: No acute findings. Electronically Signed   By: Staci Righter M.D.   On: 03/16/2017 11:16   Dg Hip Unilat With Pelvis 2-3 Views Left  Result Date: 03/16/2017 CLINICAL DATA:  Fall, left hip pain EXAM: DG HIP (WITH OR WITHOUT PELVIS) 2-3V LEFT COMPARISON:  None. FINDINGS: No fracture or dislocation is seen. Mild degenerative changes of the bilateral  hips. Visualized bony pelvis appears intact. Degenerative changes of the lower lumbar spine. Vascular calcifications. IMPRESSION: No fracture or dislocation is seen. Mild degenerative changes of the bilateral hips and lower lumbar spine. Electronically Signed   By: Julian Hy M.D.   On: 03/16/2017 11:15    Procedures Procedures (including critical care time) Wound care provided by ED nurse Medications Ordered in ED Medications  cephALEXin (KEFLEX) capsule 250 mg (not administered)  bacitracin ointment 1 application (1 application Topical Given 03/16/17 1214)  Tdap (BOOSTRIX) injection 0.5 mL (0.5 mLs Intramuscular Given 03/16/17 1212)  acetaminophen (TYLENOL) tablet 650 mg (650 mg Oral Given 03/16/17 1253)     Initial Impression / Assessment and Plan / ED Course  I have reviewed the triage vital signs and the nursing notes.  Pertinent labs & imaging results that were available during my care of the patient were reviewed by me and considered in my medical decision making (see chart for details).    Patient presented to the emergency room after a fall at the nursing facility.  Patient's vitals are normal.  No abnormalities noted on the monitor.  Patient's laboratory tests are reassuring with the exception of a slight elevation of white blood cell count and a urinalysis suggestive for UTI. X-rays did not show any signs of fracture or dislocation or other acute abnormality. I suspect the patient does have urinary tract infection. This may have contributed to her weakness. Plan on discharge back to her nursing facility on a course of oral antibiotics.   Final Clinical Impressions(s) / ED Diagnoses   Final diagnoses:  Acute cystitis with hematuria  Fall, initial encounter  Skin abrasion    New Prescriptions New Prescriptions   CEPHALEXIN (KEFLEX) 250 MG CAPSULE    Take 1 capsule (250 mg total) by mouth 2 (two) times daily.     Dorie Rank, MD 03/16/17 1346

## 2017-03-17 DIAGNOSIS — G4700 Insomnia, unspecified: Secondary | ICD-10-CM | POA: Diagnosis not present

## 2017-03-17 DIAGNOSIS — K5900 Constipation, unspecified: Secondary | ICD-10-CM | POA: Diagnosis not present

## 2017-03-17 DIAGNOSIS — M25552 Pain in left hip: Secondary | ICD-10-CM | POA: Diagnosis not present

## 2017-03-17 LAB — URINE CULTURE: CULTURE: NO GROWTH

## 2017-03-21 DIAGNOSIS — Z79899 Other long term (current) drug therapy: Secondary | ICD-10-CM | POA: Diagnosis not present

## 2017-03-25 ENCOUNTER — Emergency Department (HOSPITAL_COMMUNITY)
Admission: EM | Admit: 2017-03-25 | Discharge: 2017-03-26 | Disposition: A | Discharge status: Home / Self Care | Payer: Medicare Other | Attending: Emergency Medicine | Admitting: Emergency Medicine

## 2017-03-25 ENCOUNTER — Emergency Department (HOSPITAL_COMMUNITY): Payer: Medicare Other

## 2017-03-25 DIAGNOSIS — Y939 Activity, unspecified: Secondary | ICD-10-CM | POA: Insufficient documentation

## 2017-03-25 DIAGNOSIS — I10 Essential (primary) hypertension: Secondary | ICD-10-CM | POA: Diagnosis not present

## 2017-03-25 DIAGNOSIS — S32592A Other specified fracture of left pubis, initial encounter for closed fracture: Secondary | ICD-10-CM | POA: Insufficient documentation

## 2017-03-25 DIAGNOSIS — Y92129 Unspecified place in nursing home as the place of occurrence of the external cause: Secondary | ICD-10-CM | POA: Insufficient documentation

## 2017-03-25 DIAGNOSIS — S32511A Fracture of superior rim of right pubis, initial encounter for closed fracture: Secondary | ICD-10-CM | POA: Diagnosis not present

## 2017-03-25 DIAGNOSIS — W1830XA Fall on same level, unspecified, initial encounter: Secondary | ICD-10-CM | POA: Insufficient documentation

## 2017-03-25 DIAGNOSIS — M25552 Pain in left hip: Secondary | ICD-10-CM | POA: Diagnosis not present

## 2017-03-25 DIAGNOSIS — Y999 Unspecified external cause status: Secondary | ICD-10-CM | POA: Diagnosis not present

## 2017-03-25 DIAGNOSIS — Z79899 Other long term (current) drug therapy: Secondary | ICD-10-CM | POA: Insufficient documentation

## 2017-03-25 DIAGNOSIS — F0390 Unspecified dementia without behavioral disturbance: Secondary | ICD-10-CM | POA: Insufficient documentation

## 2017-03-25 DIAGNOSIS — Z87891 Personal history of nicotine dependence: Secondary | ICD-10-CM | POA: Diagnosis not present

## 2017-03-25 DIAGNOSIS — M545 Low back pain: Secondary | ICD-10-CM | POA: Diagnosis not present

## 2017-03-25 DIAGNOSIS — S32512A Fracture of superior rim of left pubis, initial encounter for closed fracture: Secondary | ICD-10-CM | POA: Diagnosis not present

## 2017-03-25 DIAGNOSIS — T148XXA Other injury of unspecified body region, initial encounter: Secondary | ICD-10-CM | POA: Diagnosis not present

## 2017-03-25 LAB — CBC WITH DIFFERENTIAL/PLATELET
BASOS ABS: 0 10*3/uL (ref 0.0–0.1)
BASOS PCT: 0 %
EOS ABS: 0.1 10*3/uL (ref 0.0–0.7)
EOS PCT: 1 %
HCT: 39.5 % (ref 36.0–46.0)
Hemoglobin: 13.3 g/dL (ref 12.0–15.0)
Lymphocytes Relative: 15 %
Lymphs Abs: 1.7 10*3/uL (ref 0.7–4.0)
MCH: 30 pg (ref 26.0–34.0)
MCHC: 33.7 g/dL (ref 30.0–36.0)
MCV: 89.2 fL (ref 78.0–100.0)
MONO ABS: 0.8 10*3/uL (ref 0.1–1.0)
Monocytes Relative: 7 %
Neutro Abs: 8.9 10*3/uL — ABNORMAL HIGH (ref 1.7–7.7)
Neutrophils Relative %: 77 %
PLATELETS: 326 10*3/uL (ref 150–400)
RBC: 4.43 MIL/uL (ref 3.87–5.11)
RDW: 14.4 % (ref 11.5–15.5)
WBC: 11.5 10*3/uL — AB (ref 4.0–10.5)

## 2017-03-25 LAB — BASIC METABOLIC PANEL
ANION GAP: 9 (ref 5–15)
BUN: 13 mg/dL (ref 6–20)
CALCIUM: 9.9 mg/dL (ref 8.9–10.3)
CO2: 29 mmol/L (ref 22–32)
Chloride: 104 mmol/L (ref 101–111)
Creatinine, Ser: 0.94 mg/dL (ref 0.44–1.00)
GFR calc Af Amer: >60 mL/min (ref >60)
GFR, EST NON AFRICAN AMERICAN: 54 mL/min — AB (ref >60)
GLUCOSE: 114 mg/dL — AB (ref 65–99)
POTASSIUM: 3.9 mmol/L (ref 3.5–5.1)
SODIUM: 142 mmol/L (ref 135–145)

## 2017-03-25 MED ORDER — FENTANYL CITRATE (PF) 100 MCG/2ML IJ SOLN
25.0000 ug | Freq: Once | INTRAMUSCULAR | Status: AC
  Administered 2017-03-25: 25 ug via INTRAVENOUS
  Filled 2017-03-25: qty 2

## 2017-03-25 MED ORDER — TRAMADOL HCL 50 MG PO TABS: 50.0000 mg | ORAL_TABLET | Freq: Three times a day (TID) | ORAL | 0 refills | Status: AC | PRN

## 2017-03-25 MED ORDER — ACETAMINOPHEN 500 MG PO TABS
1000.0000 mg | ORAL_TABLET | Freq: Once | ORAL | Status: AC
  Administered 2017-03-25: 1000 mg via ORAL
  Filled 2017-03-25: qty 2

## 2017-03-25 MED ORDER — ACETAMINOPHEN 500 MG PO TABS: 1000.0000 mg | ORAL_TABLET | Freq: Three times a day (TID) | ORAL | 0 refills | Status: AC

## 2017-03-25 NOTE — ED Notes (Signed)
Bed: GU54 Expected date:  Expected time:  Means of arrival:  Comments: HALL D

## 2017-03-25 NOTE — ED Notes (Signed)
Bed: [facility] Expected date:  Expected time:  Means of arrival:  Comments: 

## 2017-03-25 NOTE — ED Triage Notes (Signed)
Patient from [facility] memory care unit.  Staff noticed increased pain to left hip and bruising to the area.  No fall was witnessed today but patient did fall back on the 16th in which she was evaluated.  Patient is a DNR.  Normal ambulatory status is with a walker.  Patient has not ambulated for a few days.

## 2017-03-25 NOTE — ED Notes (Signed)
[facility] contacted for discharge transport 

## 2017-03-25 NOTE — ED Provider Notes (Signed)
Jamie [name] Provider Note   CSN: [id number] Arrival date & time: 03/25/17  1606     History   Chief Complaint Chief Complaint  Patient presents with  . Hip Pain    HPI Jamie [name] is a 83 y.o. female.  HPI  43-year-old female with a history of dementia who presents to the ED with left hip pain. Of note patient had a mechanical fall on 7/16 and was evaluated for illicit pain with plain film that was negative. Skilled nursing facility states that they have not noted that she fell recently. Reports that she normally ambulates with a walker but has not ambulated in a couple of days due to the pain.  Remainder of history, ROS, and physical exam limited due to patient's condition (DEMENTIA). Additional information was obtained from EMS.   Level V Caveat.   Past Medical History:  Diagnosis Date  . Anxiety   . Chronic pain   . Depression   . Gastric ulcer 05/2011  . Hyperlipidemia   . Hypertension   . Obesity    5'3"    Patient Active Problem List   Diagnosis Date Noted  . Aortic stenosis 06/22/2015  . Hypotension 06/21/2015  . Chest pain 06/21/2015  . Anemia associated with acute blood loss   . AKI (acute kidney injury) ([place])   . Tachypnea   . Hypoxia 04/18/2015  . Acute respiratory failure with hypoxia and hypercapnia ([place]) 04/17/2015  . Acute respiratory failure with hypoxia ([place]) 04/17/2015  . HCAP (healthcare-associated pneumonia) 04/17/2015  . Acute-on-chronic kidney injury ([place]) 04/17/2015  . Anemia 04/17/2015  . Sepsis ([place]) 04/17/2015  . Cellulitis of leg, right 01/01/2015  . Acute blood loss anemia 01/01/2015  . Elevated troponin 01/01/2015  . Hypokalemia 01/01/2015  . Depression 01/01/2015  . GI bleed 12/27/2014  . Bleeding gastrointestinal   . Hemorrhagic shock ([place])   . Alcohol dependence with uncomplicated withdrawal ([place])   . Alcohol dependence ([place]) 09/30/2014  . Dementia with behavioral disturbance 09/29/2014  . Delusions ([place])  09/29/2014  . Dementia 12/13/2013  . GI bleeding 08/12/2012  . Obesity   . Chronic pain   . Gastric ulcer 05/03/2011  . Bladder spasm 02/10/2011  . Murmur, cardiac 11/20/2010  . SHOULDER PAIN, RIGHT 04/15/2010  . LOC OSTEOARTHROS NOT SPEC PRIM/SEC LOWER LEG 04/10/2010  . CALF PAIN, RIGHT 03/01/2010  . CERVICAL STRAIN, ACUTE 02/25/2010  . ANXIETY STATE, UNSPECIFIED 06/20/2009  . SEBACEOUS CYST, INFECTED 06/20/2009  . ALLERGIC RHINITIS DUE TO OTHER ALLERGEN 11/17/2008  . TACHYCARDIA 05/16/2008  . CONTUSION OF BREAST 05/05/2008  . HYPERCALCEMIA 01/20/2008  . EPISTAXIS 12/30/2007  . VITAMIN B12 DEFICIENCY 11/23/2007  . APHTHOUS ULCERS 11/23/2007  . ASYMPTOMATIC POSTMENOPAUSAL STATUS 08/18/2007  . Essential hypertension 05/10/2007  . ADVEF, DRUG/MEDICINAL/BIOLOGICAL SUBST NOS 05/10/2007  . Hyperlipemia 02/23/2007    Past Surgical History:  Procedure Laterality Date  . ABDOMINAL HYSTERECTOMY     PARTIAL  . BILATERAL OOPHORECTOMY    . ESOPHAGOGASTRODUODENOSCOPY  08/12/2012   Procedure: ESOPHAGOGASTRODUODENOSCOPY (EGD);  Surgeon: Arta Silence, MD;  Location: [facility];  Service: Endoscopy;  Laterality: Left;  . ESOPHAGOGASTRODUODENOSCOPY (EGD) WITH PROPOFOL N/A 12/28/2014   Procedure: ESOPHAGOGASTRODUODENOSCOPY (EGD) WITH PROPOFOL;  Surgeon: Wilford Corner, MD;  Location: [facility];  Service: Endoscopy;  Laterality: N/A;  . skin biopsy     nose lesion, noncancerous    OB History    No data available       Home Medications    Prior to Admission medications  Medication Sig Start Date End Date Taking? Authorizing Provider  ARIPiprazole (ABILIFY) 5 MG tablet Take 1 tablet (5 mg total) by mouth at bedtime. 06/24/15  Yes Charlynne Cousins, MD  calcium carbonate (OS-CAL - DOSED IN MG OF ELEMENTAL CALCIUM) 1250 (500 Ca) MG tablet Take 1 tablet by mouth 2 (two) times daily with a meal.   Yes [provider]  furosemide (LASIX) 40 MG tablet Take 1 tablet (40 mg  total) by mouth daily. 06/24/15  Yes Charlynne Cousins, MD  gabapentin (NEURONTIN) 100 MG capsule Take 1 capsule (100 mg total) by mouth at bedtime. 06/24/15  Yes Charlynne Cousins, MD  Hypromellose (ISOPTO TEARS) 0.5 % SOLN Apply 1 drop to eye 3 (three) times daily. One drop in each eye, three times daily. Patient taking differently: Place 1 drop into both eyes 3 (three) times daily.  06/24/15  Yes Charlynne Cousins, MD  magnesium oxide (MAG-OX) 400 MG tablet Take 400 mg by mouth daily.   Yes [provider]  Melatonin 1 MG TABS Take 1 mg by mouth at bedtime.    Yes [provider]  olopatadine (PATANOL) 0.1 % ophthalmic solution Place 1 drop into both eyes 2 (two) times daily. 06/24/15  Yes Charlynne Cousins, MD  pantoprazole (PROTONIX) 40 MG tablet Take 1 tablet (40 mg total) by mouth 2 (two) times daily before a meal. Patient taking differently: Take 40 mg by mouth daily.  06/24/15  Yes Charlynne Cousins, MD  polyethylene glycol [facility] Floria Raveling) packet Take 17 g by mouth daily. 06/24/15  Yes Charlynne Cousins, MD  potassium chloride SA (K-DUR,KLOR-CON) 20 MEQ tablet Take 60 mEq by mouth daily.   Yes [provider]  senna (SENOKOT) 8.6 MG TABS tablet Take 1 tablet (8.6 mg total) by mouth at bedtime. 06/24/15  Yes Charlynne Cousins, MD  sertraline (ZOLOFT) 50 MG tablet Take 1 tablet (50 mg total) by mouth daily. 06/24/15  Yes Charlynne Cousins, MD  acetaminophen (TYLENOL) 500 MG tablet Take 2 tablets (1,000 mg total) by mouth every 8 (eight) hours. Do not take more than 4000 mg of acetaminophen (Tylenol) in a 24-hour period. Please note that other medicines that you may be prescribed may have Tylenol as well. 03/25/17 03/30/17  Fatima Blank, MD  cephALEXin (KEFLEX) 250 MG capsule Take 1 capsule (250 mg total) by mouth 2 (two) times daily. Patient not taking: Reported on 03/25/2017 03/16/17   Dorie Rank, MD  fluticasone [facility]) 50  MCG/ACT nasal spray Place 2 sprays into both nostrils daily. Patient not taking: Reported on 03/16/2017 06/24/15   Charlynne Cousins, MD  potassium chloride (K-DUR) 10 MEQ tablet Take 2 tablets (20 mEq total) by mouth daily. Patient not taking: Reported on 03/16/2017 06/24/15   Charlynne Cousins, MD  predniSONE (DELTASONE) 5 MG tablet Take 1 tablet (5 mg total) by mouth daily with breakfast. Patient not taking: Reported on 03/16/2017 06/24/15   Charlynne Cousins, MD  risperiDONE (RISPERDAL) 0.5 MG tablet Take 1-2 tablets (0.5-1 mg total) by mouth 2 (two) times daily. 1 tab in the morning and 2 tabs at bedtime Patient not taking: Reported on 03/16/2017 06/24/15   Charlynne Cousins, MD  risperiDONE (RISPERDAL) 1 MG tablet Take 1 tablet (1 mg total) by mouth at bedtime. Patient not taking: Reported on 03/16/2017 06/24/15   Charlynne Cousins, MD  traMADol (ULTRAM) 50 MG tablet Take 1 tablet (50 mg total) by mouth every 8 (  eight) hours as needed. 03/25/17 03/30/17  Fatima Blank, MD    Family History Family History  Problem Relation Age of Onset  . Hypertension Mother   . Hyperlipidemia Mother   . Heart disease Father        66s  . Cancer Brother     Social History Social History  Substance Use Topics  . Smoking status: Former Smoker    Packs/day: 1.00    Types: Cigarettes    Quit date: 09/02/1971  . Smokeless tobacco: Never Used  . Alcohol use Yes     Comment: occasionally     Allergies   Pollen extract   Review of Systems Review of Systems  Unable to perform ROS: Dementia     Physical Exam Updated Vital Signs BP (!) 100/58 (BP Location: Right Arm)   Pulse 96   Temp 99.2 F (37.3 C) (Oral)   Resp 16   SpO2 91%   Physical Exam  Constitutional: She is oriented to person, place, and time. She appears well-developed and well-nourished. No distress.  HENT:  Head: Normocephalic and atraumatic.  Right Ear: External ear normal.  Left Ear: External ear  normal.  Nose: Nose normal.  Eyes: Pupils are equal, round, and reactive to light. Conjunctivae and EOM are normal. Right eye exhibits no discharge. Left eye exhibits no discharge. No scleral icterus.  Neck: Normal range of motion. Neck supple.  Cardiovascular: Normal rate, regular rhythm and normal heart sounds.  Exam reveals no gallop and no friction rub.   No murmur heard. Pulses:      Radial pulses are 2+ on the right side, and 2+ on the left side.       Dorsalis pedis pulses are 2+ on the right side, and 2+ on the left side.  Pulmonary/Chest: Effort normal and breath sounds normal. No stridor. No respiratory distress. She has no wheezes.  Abdominal: Soft. She exhibits no distension. There is no tenderness.  Musculoskeletal: She exhibits no edema.       Left hip: She exhibits tenderness.       Cervical back: She exhibits no bony tenderness.       Thoracic back: She exhibits no bony tenderness.       Lumbar back: She exhibits tenderness. She exhibits no bony tenderness.       Back:       Legs: Clavicles stable. Chest stable to AP/Lat compression. Pelvis stable to Lat compression. No obvious extremity deformity. No chest or abdominal wall contusion.  Neurological: She is alert and oriented to person, place, and time.  Moving all extremities  Skin: Skin is warm and dry. No rash noted. She is not diaphoretic. No erythema.  Psychiatric: She has a normal mood and affect.     ED Treatments / Results  Labs (all labs ordered are listed, but only abnormal results are displayed) Labs Reviewed  CBC WITH DIFFERENTIAL/PLATELET - Abnormal; Notable for the following:       Result Value   WBC 11.5 (*)    Neutro Abs 8.9 (*)    All other components within normal limits  BASIC METABOLIC PANEL - Abnormal; Notable for the following:    Glucose, Bld 114 (*)    GFR calc non Af Amer 54 (*)    All other components within normal limits    EKG  EKG Interpretation None       Radiology Dg  Lumbar Spine 2-3 Views  Result Date: 03/25/2017 CLINICAL DATA:  Low back pain  EXAM: LUMBAR SPINE - 2-3 VIEW COMPARISON:  06/22/2015. FINDINGS: Bilateral renal calculi are again seen stable from prior CT examination from 2016. Significant osteopenia is noted. No definitive compression deformity is seen. Diffuse aortic calcifications are noted. Facet hypertrophic changes are seen. IMPRESSION: Degenerative change without acute abnormality. Bilateral renal calculi stable from prior CT. Electronically Signed   By: Inez Catalina M.D.   On: 03/25/2017 18:33   Dg Hip Unilat W Or Wo Pelvis 2-3 Views Left  Result Date: 03/25/2017 CLINICAL DATA:  Left hip pain and bruising, initial encounter EXAM: DG HIP (WITH OR WITHOUT PELVIS) 2-3V LEFT COMPARISON:  03/16/2017 FINDINGS: There are fractures better visualize through the superior and inferior pubic rami on the left. No other fracture or dislocation is seen. Degenerative changes of the hip joints and lumbar spine are noted. Proximal femur is unremarkable. IMPRESSION: Fractures through the superior and inferior pubic rami on the left. Electronically Signed   By: Inez Catalina M.D.   On: 03/25/2017 18:31    Procedures Procedures (including critical care time)  Medications Ordered in ED Medications  acetaminophen (TYLENOL) tablet 1,000 mg (1,000 mg Oral Given 03/25/17 1747)  fentaNYL (SUBLIMAZE) injection 25 mcg (25 mcg Intravenous Given 03/25/17 1943)     Initial Impression / Assessment and Plan / ED Course  I have reviewed the triage vital signs and the nursing notes.  Pertinent labs & imaging results that were available during my care of the patient were reviewed by me and considered in my medical decision making (see chart for details).     We will obtain repeat plain films to assess for missed occult fracture.  Plain film with a left superior and inferior pubic rami fracture. Patient is already in a skilled nursing facility that provides physical  therapy. I contacted the facility who reassured that the patient can start physical therapy for nonweightbearing status. Discussed case with Dr. Percell Miller from orthopedic surgery who will follow-up with the patient in 1 week.  The patient is safe for discharge with strict return precautions.   Final Clinical Impressions(s) / ED Diagnoses   Final diagnoses:  Closed fracture of multiple pubic rami, left, initial encounter [facility])   Disposition: Discharge  Condition: Good  I have discussed the results, Dx and Tx plan with the patient and sNF who expressed understanding and agree(s) with the plan. Discharge instructions discussed at great length. The patient and sNF were given strict return precautions who verbalized understanding of the instructions. No further questions at time of discharge.    New Prescriptions   ACETAMINOPHEN (TYLENOL) 500 MG TABLET    Take 2 tablets (1,000 mg total) by mouth every 8 (eight) hours. Do not take more than 4000 mg of acetaminophen (Tylenol) in a 24-hour period. Please note that other medicines that you may be prescribed may have Tylenol as well.   TRAMADOL (ULTRAM) 50 MG TABLET    Take 1 tablet (50 mg total) by mouth every 8 (eight) hours as needed.    Follow Up: Kennyth Arnold, FNP [address]  [place] [address] [phone]     Renette Butters, MD [address] [place] [address] [phone]  In 1 week For close follow up      Fatima Blank, MD 03/25/17 2208

## 2017-03-25 NOTE — Discharge Instructions (Signed)
Patient is to be nonweightbearing and initiate physical therapy at the skilled nursing facility. She will need to follow-up with Dr. Percell Miller of orthopedics surgery in 1 week. She has been prescribed scheduled Tylenol and when necessary, no for pain control. Have the patient follow-up with her primary care provider or the facility physician for additional pain management as needed. Please ensure that the patient is on a good bowel regimen given the addition of the narcotic medicine.

## 2017-03-26 ENCOUNTER — Other Ambulatory Visit: Payer: Self-pay | Admitting: Nurse Practitioner

## 2017-03-26 DIAGNOSIS — S51802A Unspecified open wound of left forearm, initial encounter: Secondary | ICD-10-CM | POA: Diagnosis not present

## 2017-03-26 DIAGNOSIS — I10 Essential (primary) hypertension: Secondary | ICD-10-CM | POA: Diagnosis not present

## 2017-03-26 DIAGNOSIS — S3993XA Unspecified injury of pelvis, initial encounter: Secondary | ICD-10-CM | POA: Diagnosis not present

## 2017-03-26 DIAGNOSIS — S72002S Fracture of unspecified part of neck of left femur, sequela: Secondary | ICD-10-CM | POA: Diagnosis not present

## 2017-03-26 DIAGNOSIS — S79911A Unspecified injury of right hip, initial encounter: Secondary | ICD-10-CM | POA: Diagnosis not present

## 2017-03-26 DIAGNOSIS — G8911 Acute pain due to trauma: Secondary | ICD-10-CM | POA: Diagnosis not present

## 2017-03-27 ENCOUNTER — Emergency Department (HOSPITAL_COMMUNITY): Payer: Medicare Other

## 2017-03-27 ENCOUNTER — Encounter (HOSPITAL_COMMUNITY): Payer: Self-pay | Admitting: Nurse Practitioner

## 2017-03-27 ENCOUNTER — Emergency Department (HOSPITAL_COMMUNITY)
Admission: EM | Admit: 2017-03-27 | Discharge: 2017-03-27 | Disposition: A | Discharge status: Skilled Nursing Facility | Payer: Medicare Other | Attending: Emergency Medicine | Admitting: Emergency Medicine

## 2017-03-27 DIAGNOSIS — F0391 Unspecified dementia with behavioral disturbance: Secondary | ICD-10-CM | POA: Insufficient documentation

## 2017-03-27 DIAGNOSIS — Z79899 Other long term (current) drug therapy: Secondary | ICD-10-CM | POA: Diagnosis not present

## 2017-03-27 DIAGNOSIS — N390 Urinary tract infection, site not specified: Secondary | ICD-10-CM | POA: Diagnosis not present

## 2017-03-27 DIAGNOSIS — M25552 Pain in left hip: Secondary | ICD-10-CM

## 2017-03-27 DIAGNOSIS — Z87891 Personal history of nicotine dependence: Secondary | ICD-10-CM | POA: Insufficient documentation

## 2017-03-27 DIAGNOSIS — S199XXA Unspecified injury of neck, initial encounter: Secondary | ICD-10-CM | POA: Diagnosis not present

## 2017-03-27 DIAGNOSIS — S32511A Fracture of superior rim of right pubis, initial encounter for closed fracture: Secondary | ICD-10-CM | POA: Diagnosis not present

## 2017-03-27 DIAGNOSIS — I10 Essential (primary) hypertension: Secondary | ICD-10-CM | POA: Insufficient documentation

## 2017-03-27 DIAGNOSIS — T148XXA Other injury of unspecified body region, initial encounter: Secondary | ICD-10-CM | POA: Diagnosis not present

## 2017-03-27 DIAGNOSIS — R51 Headache: Secondary | ICD-10-CM | POA: Diagnosis not present

## 2017-03-27 DIAGNOSIS — W19XXXA Unspecified fall, initial encounter: Secondary | ICD-10-CM

## 2017-03-27 DIAGNOSIS — S72009A Fracture of unspecified part of neck of unspecified femur, initial encounter for closed fracture: Secondary | ICD-10-CM | POA: Diagnosis not present

## 2017-03-27 DIAGNOSIS — S0990XA Unspecified injury of head, initial encounter: Secondary | ICD-10-CM | POA: Diagnosis not present

## 2017-03-27 LAB — BASIC METABOLIC PANEL
Anion gap: 11 (ref 5–15)
BUN: 16 mg/dL (ref 6–20)
CALCIUM: 10.3 mg/dL (ref 8.9–10.3)
CO2: 31 mmol/L (ref 22–32)
CREATININE: 0.99 mg/dL (ref 0.44–1.00)
Chloride: 100 mmol/L — ABNORMAL LOW (ref 101–111)
GFR, EST AFRICAN AMERICAN: 59 mL/min — AB (ref >60)
GFR, EST NON AFRICAN AMERICAN: 51 mL/min — AB (ref >60)
GLUCOSE: 99 mg/dL (ref 65–99)
Potassium: 4 mmol/L (ref 3.5–5.1)
Sodium: 142 mmol/L (ref 135–145)

## 2017-03-27 LAB — CBC WITH DIFFERENTIAL/PLATELET
BASOS PCT: 0 %
Basophils Absolute: 0 10*3/uL (ref 0.0–0.1)
EOS ABS: 0 10*3/uL (ref 0.0–0.7)
Eosinophils Relative: 0 %
HEMATOCRIT: 43 % (ref 36.0–46.0)
HEMOGLOBIN: 14.7 g/dL (ref 12.0–15.0)
Lymphocytes Relative: 10 %
Lymphs Abs: 1.1 10*3/uL (ref 0.7–4.0)
MCH: 29.9 pg (ref 26.0–34.0)
MCHC: 34.2 g/dL (ref 30.0–36.0)
MCV: 87.4 fL (ref 78.0–100.0)
MONO ABS: 0.7 10*3/uL (ref 0.1–1.0)
MONOS PCT: 6 %
NEUTROS PCT: 84 %
Neutro Abs: 8.8 10*3/uL — ABNORMAL HIGH (ref 1.7–7.7)
Platelets: 333 10*3/uL (ref 150–400)
RBC: 4.92 MIL/uL (ref 3.87–5.11)
RDW: 14.2 % (ref 11.5–15.5)
WBC: 10.6 10*3/uL — ABNORMAL HIGH (ref 4.0–10.5)

## 2017-03-27 MED ORDER — ACETAMINOPHEN 325 MG PO TABS
650.0000 mg | ORAL_TABLET | Freq: Once | ORAL | Status: AC
Start: 2017-03-27 — End: 2017-03-27
  Administered 2017-03-27: 650 mg via ORAL
  Filled 2017-03-27: qty 2

## 2017-03-27 MED ORDER — OXYCODONE HCL 5 MG PO TABS
2.5000 mg | ORAL_TABLET | Freq: Once | ORAL | Status: AC
  Administered 2017-03-27: 2.5 mg via ORAL
  Filled 2017-03-27: qty 1

## 2017-03-27 MED ORDER — OXYCODONE HCL 5 MG PO TABS: 2.5000 mg | ORAL_TABLET | Freq: Four times a day (QID) | ORAL | 0 refills | Status: DC | PRN

## 2017-03-27 NOTE — Discharge Instructions (Signed)
Take oxycodone 2.5 mg as needed for severe pain. Continue Tylenol as directed. Follow-up with PCP for further evaluation if pain persists. Return to ED for worsening pain, additional injuries, loss of consciousness, chest pain, trouble breathing.

## 2017-03-27 NOTE — ED Triage Notes (Signed)
Pt arrives via ems with c/o fall and L hip pain. She is from [facility]. Per ems no deformities noted. Pt is a DNR. Spinal clearance done, per home denies LOC, pt did not hit head. Pt fell out of the bed and bed was in lowest position, maybe about 2" from floor. Neuro intact at baseline. VS: 132/90, 80, 18, 96% RA, CBG 128. Rates pain 8/10 in hip. A&O x3. Patient doesn't remember how she fell out of the bed. Fall was unwitnessed by staff. Pt denies hitting her head to staff and ems.C-collar in place for added support being fall unwitnessed.

## 2017-03-27 NOTE — ED Notes (Signed)
Bed: IW58 Expected date:  Expected time:  Means of arrival:  Comments: EMS- fall w/ hip pain

## 2017-03-27 NOTE — ED Notes (Signed)
[facility] called to transports back to [facility] skilled facility, spoke with Thayer Headings.

## 2017-03-27 NOTE — ED Notes (Signed)
Patient transported to X-ray 

## 2017-03-27 NOTE — ED Provider Notes (Signed)
[facility] DEPT Provider Note   CSN: [id number] Arrival date & time: 03/27/17  0930     History   Chief Complaint Chief Complaint  Patient presents with  . Fall  . Hip Pain    HPI Jamie [name] is a 83 y.o. female.  HPI  Patient, with a past medical history of dementia, presents to ED for evaluation of hip pain after fall from bed prior to arrival. To me, she states that "everything hurts," including head and neck. However per EMS the patient complains of left hip pain, did not lose consciousness, did not hit head and spinal clearance was done. She was seen here in the emergency room 10 days ago and again 2 days ago for left hip pain after mechanical fall. HPI limited due to dementia.  Past Medical History:  Diagnosis Date  . Anxiety   . Chronic pain   . Depression   . Gastric ulcer 05/2011  . Hyperlipidemia   . Hypertension   . Obesity    5'3"    Patient Active Problem List   Diagnosis Date Noted  . Aortic stenosis 06/22/2015  . Hypotension 06/21/2015  . Chest pain 06/21/2015  . Anemia associated with acute blood loss   . AKI (acute kidney injury) ([place])   . Tachypnea   . Hypoxia 04/18/2015  . Acute respiratory failure with hypoxia and hypercapnia ([place]) 04/17/2015  . Acute respiratory failure with hypoxia ([place]) 04/17/2015  . HCAP (healthcare-associated pneumonia) 04/17/2015  . Acute-on-chronic kidney injury ([place]) 04/17/2015  . Anemia 04/17/2015  . Sepsis ([place]) 04/17/2015  . Cellulitis of leg, right 01/01/2015  . Acute blood loss anemia 01/01/2015  . Elevated troponin 01/01/2015  . Hypokalemia 01/01/2015  . Depression 01/01/2015  . GI bleed 12/27/2014  . Bleeding gastrointestinal   . Hemorrhagic shock ([place])   . Alcohol dependence with uncomplicated withdrawal ([place])   . Alcohol dependence ([place]) 09/30/2014  . Dementia with behavioral disturbance 09/29/2014  . Delusions ([place]) 09/29/2014  . Dementia 12/13/2013  . GI bleeding 08/12/2012  . Obesity   .  Chronic pain   . Gastric ulcer 05/03/2011  . Bladder spasm 02/10/2011  . Murmur, cardiac 11/20/2010  . SHOULDER PAIN, RIGHT 04/15/2010  . LOC OSTEOARTHROS NOT SPEC PRIM/SEC LOWER LEG 04/10/2010  . CALF PAIN, RIGHT 03/01/2010  . CERVICAL STRAIN, ACUTE 02/25/2010  . ANXIETY STATE, UNSPECIFIED 06/20/2009  . SEBACEOUS CYST, INFECTED 06/20/2009  . ALLERGIC RHINITIS DUE TO OTHER ALLERGEN 11/17/2008  . TACHYCARDIA 05/16/2008  . CONTUSION OF BREAST 05/05/2008  . HYPERCALCEMIA 01/20/2008  . EPISTAXIS 12/30/2007  . VITAMIN B12 DEFICIENCY 11/23/2007  . APHTHOUS ULCERS 11/23/2007  . ASYMPTOMATIC POSTMENOPAUSAL STATUS 08/18/2007  . Essential hypertension 05/10/2007  . ADVEF, DRUG/MEDICINAL/BIOLOGICAL SUBST NOS 05/10/2007  . Hyperlipemia 02/23/2007    Past Surgical History:  Procedure Laterality Date  . ABDOMINAL HYSTERECTOMY     PARTIAL  . BILATERAL OOPHORECTOMY    . ESOPHAGOGASTRODUODENOSCOPY  08/12/2012   Procedure: ESOPHAGOGASTRODUODENOSCOPY (EGD);  Surgeon: Arta Silence, MD;  Location: [facility];  Service: Endoscopy;  Laterality: Left;  . ESOPHAGOGASTRODUODENOSCOPY (EGD) WITH PROPOFOL N/A 12/28/2014   Procedure: ESOPHAGOGASTRODUODENOSCOPY (EGD) WITH PROPOFOL;  Surgeon: Wilford Corner, MD;  Location: [facility];  Service: Endoscopy;  Laterality: N/A;  . skin biopsy     nose lesion, noncancerous    OB History    No data available       Home Medications    Prior to Admission medications   Medication Sig Start Date End Date Taking?  Authorizing Provider  acetaminophen (TYLENOL) 500 MG tablet Take 2 tablets (1,000 mg total) by mouth every 8 (eight) hours. Do not take more than 4000 mg of acetaminophen (Tylenol) in a 24-hour period. Please note that other medicines that you may be prescribed may have Tylenol as well. 03/25/17 03/30/17 Yes Cardama, Grayce Sessions, MD  ARIPiprazole (ABILIFY) 5 MG tablet Take 1 tablet (5 mg total) by mouth at bedtime. 06/24/15  Yes Charlynne Cousins, MD  calcium carbonate (OS-CAL - DOSED IN MG OF ELEMENTAL CALCIUM) 1250 (500 Ca) MG tablet Take 1 tablet by mouth 2 (two) times daily with a meal.   Yes [provider]  furosemide (LASIX) 40 MG tablet Take 1 tablet (40 mg total) by mouth daily. 06/24/15  Yes Charlynne Cousins, MD  gabapentin (NEURONTIN) 100 MG capsule Take 1 capsule (100 mg total) by mouth at bedtime. 06/24/15  Yes Charlynne Cousins, MD  Hypromellose (ISOPTO TEARS) 0.5 % SOLN Apply 1 drop to eye 3 (three) times daily. One drop in each eye, three times daily. Patient taking differently: Place 1 drop into both eyes 3 (three) times daily.  06/24/15  Yes Charlynne Cousins, MD  magnesium oxide (MAG-OX) 400 MG tablet Take 400 mg by mouth daily.   Yes [provider]  Melatonin 1 MG TABS Take 1 mg by mouth at bedtime.    Yes [provider]  olopatadine (PATANOL) 0.1 % ophthalmic solution Place 1 drop into both eyes 2 (two) times daily. 06/24/15  Yes Charlynne Cousins, MD  pantoprazole (PROTONIX) 40 MG tablet Take 1 tablet (40 mg total) by mouth 2 (two) times daily before a meal. Patient taking differently: Take 40 mg by mouth daily.  06/24/15  Yes Charlynne Cousins, MD  polyethylene glycol [facility] / Floria Raveling) packet Take 17 g by mouth daily. 06/24/15  Yes Charlynne Cousins, MD  potassium chloride SA (K-DUR,KLOR-CON) 20 MEQ tablet Take 60 mEq by mouth daily.   Yes [provider]  senna (SENOKOT) 8.6 MG TABS tablet Take 1 tablet (8.6 mg total) by mouth at bedtime. 06/24/15  Yes Charlynne Cousins, MD  sertraline (ZOLOFT) 50 MG tablet Take 1 tablet (50 mg total) by mouth daily. 06/24/15  Yes Charlynne Cousins, MD  traMADol (ULTRAM) 50 MG tablet Take 1 tablet (50 mg total) by mouth every 8 (eight) hours as needed. Patient taking differently: Take 25 mg by mouth 2 (two) times daily.  03/25/17 03/30/17 Yes Cardama, Grayce Sessions, MD  cephALEXin (KEFLEX) 250 MG capsule Take 1  capsule (250 mg total) by mouth 2 (two) times daily. Patient not taking: Reported on 03/25/2017 03/16/17   Dorie Rank, MD  fluticasone [facility]) 50 MCG/ACT nasal spray Place 2 sprays into both nostrils daily. Patient not taking: Reported on 03/16/2017 06/24/15   Charlynne Cousins, MD  oxyCODONE (ROXICODONE) 5 MG immediate release tablet Take 0.5 tablets (2.5 mg total) by mouth every 6 (six) hours as needed for severe pain. 03/27/17   Rayshon Albaugh, PA-C  potassium chloride (K-DUR) 10 MEQ tablet Take 2 tablets (20 mEq total) by mouth daily. Patient not taking: Reported on 03/16/2017 06/24/15   Charlynne Cousins, MD  predniSONE (DELTASONE) 5 MG tablet Take 1 tablet (5 mg total) by mouth daily with breakfast. Patient not taking: Reported on 03/16/2017 06/24/15   Charlynne Cousins, MD  risperiDONE (RISPERDAL) 0.5 MG tablet Take 1-2 tablets (0.5-1 mg total) by mouth 2 (two) times daily. 1 tab in the  morning and 2 tabs at bedtime Patient not taking: Reported on 03/16/2017 06/24/15   Charlynne Cousins, MD  risperiDONE (RISPERDAL) 1 MG tablet Take 1 tablet (1 mg total) by mouth at bedtime. Patient not taking: Reported on 03/16/2017 06/24/15   Charlynne Cousins, MD    Family History Family History  Problem Relation Age of Onset  . Hypertension Mother   . Hyperlipidemia Mother   . Heart disease Father        37s  . Cancer Brother     Social History Social History  Substance Use Topics  . Smoking status: Former Smoker    Packs/day: 1.00    Types: Cigarettes    Quit date: 09/02/1971  . Smokeless tobacco: Never Used  . Alcohol use Yes     Comment: occasionally     Allergies   Pollen extract   Review of Systems Review of Systems  Unable to perform ROS: Dementia  Musculoskeletal: Positive for arthralgias and neck pain.  Neurological: Positive for headaches.     Physical Exam Updated Vital Signs BP (!) 155/94   Pulse 98   Temp 98.8 F (37.1 C) (Oral)   Resp 18   Ht 5\' 3"   (1.6 m)   Wt 63.5 kg (140 lb)   SpO2 94%   BMI 24.80 kg/m   Physical Exam  Constitutional: She appears well-developed and well-nourished. No distress.  HENT:  Head: Normocephalic and atraumatic.  Nose: Nose normal.  Eyes: Conjunctivae and EOM are normal. Left eye exhibits no discharge. No scleral icterus.  Neck: Normal range of motion. Neck supple.  Cardiovascular: Normal rate, regular rhythm, normal heart sounds and intact distal pulses.  Exam reveals no gallop and no friction rub.   No murmur heard. Pulmonary/Chest: Effort normal and breath sounds normal. No respiratory distress.  Abdominal: Soft. Bowel sounds are normal. She exhibits no distension. There is no tenderness. There is no guarding.  Musculoskeletal: Normal range of motion. She exhibits tenderness (Over left hip and neck). She exhibits no edema.  No midline spinal tenderness present in lumbar, thoracic spine. No step-off palpated. No visible bruising, edema or temperature change noted. No objective signs of numbness present. No saddle anesthesia. 2+ DP pulses bilaterally. Sensation intact to light touch.  No visible deformity of left hip. No bruising noted.  Neurological: She is alert. No cranial nerve deficit. She exhibits normal muscle tone. Coordination normal.  Cranial nerves appear grossly intact. Uvula and tongue midline. Alert and oriented to person and place. Equal grip strength bilaterally.  Skin: Skin is warm and dry. No rash noted.  Psychiatric: She has a normal mood and affect.  Nursing note and vitals reviewed.    ED Treatments / Results  Labs (all labs ordered are listed, but only abnormal results are displayed) Labs Reviewed  BASIC METABOLIC PANEL - Abnormal; Notable for the following:       Result Value   Chloride 100 (*)    GFR calc non Af Amer 51 (*)    GFR calc Af Amer 59 (*)    All other components within normal limits  CBC WITH DIFFERENTIAL/PLATELET - Abnormal; Notable for the following:     WBC 10.6 (*)    Neutro Abs 8.8 (*)    All other components within normal limits    EKG  EKG Interpretation None       Radiology Dg Lumbar Spine 2-3 Views  Result Date: 03/25/2017 CLINICAL DATA:  Low back pain EXAM: LUMBAR SPINE - 2-3  VIEW COMPARISON:  06/22/2015. FINDINGS: Bilateral renal calculi are again seen stable from prior CT examination from 2016. Significant osteopenia is noted. No definitive compression deformity is seen. Diffuse aortic calcifications are noted. Facet hypertrophic changes are seen. IMPRESSION: Degenerative change without acute abnormality. Bilateral renal calculi stable from prior CT. Electronically Signed   By: Inez Catalina M.D.   On: 03/25/2017 18:33   Ct Head Wo Contrast  Result Date: 03/27/2017 CLINICAL DATA:  Status post fall and left hip pain. No loss of consciousness. EXAM: CT HEAD WITHOUT CONTRAST CT CERVICAL SPINE WITHOUT CONTRAST TECHNIQUE: Multidetector CT imaging of the head and cervical spine was performed following the standard protocol without intravenous contrast. Multiplanar CT image reconstructions of the cervical spine were also generated. COMPARISON:  03/16/2017 FINDINGS: CT HEAD FINDINGS Brain: No evidence of acute infarction, hemorrhage, extra-axial collection, ventriculomegaly, or mass effect. Generalized cerebral atrophy. Periventricular white matter low attenuation likely secondary to microangiopathy. Vascular: Cerebrovascular atherosclerotic calcifications are noted. Skull: Negative for fracture or focal lesion. Sinuses/Orbits: Visualized portions of the orbits are unremarkable. Visualized portions of the paranasal sinuses and mastoid air cells are unremarkable. Other: None. CT CERVICAL SPINE FINDINGS Alignment: 3 mm anterolisthesis of C4 on C5 secondary to facet disease. Skull base and vertebrae: No acute fracture. No primary bone lesion or focal pathologic process. Soft tissues and spinal canal: No prevertebral fluid or swelling. No visible  canal hematoma. Disc levels: Degenerative disc disease with severe disc height loss at C5-6 and C6-7. Mild degenerative disc disease with disc height loss at T1-2. Diffuse bilateral facet arthropathy throughout the cervical spine. Left foraminal stenosis at C4-5. Upper chest: Mild left apical fibrosis. Other: Bilateral carotid artery atherosclerosis. IMPRESSION: 1. No acute intracranial pathology. 2.  No acute osseous injury of the cervical spine. Electronically Signed   By: Kathreen Devoid   On: 03/27/2017 10:31   Ct Cervical Spine Wo Contrast  Result Date: 03/27/2017 CLINICAL DATA:  Status post fall and left hip pain. No loss of consciousness. EXAM: CT HEAD WITHOUT CONTRAST CT CERVICAL SPINE WITHOUT CONTRAST TECHNIQUE: Multidetector CT imaging of the head and cervical spine was performed following the standard protocol without intravenous contrast. Multiplanar CT image reconstructions of the cervical spine were also generated. COMPARISON:  03/16/2017 FINDINGS: CT HEAD FINDINGS Brain: No evidence of acute infarction, hemorrhage, extra-axial collection, ventriculomegaly, or mass effect. Generalized cerebral atrophy. Periventricular white matter low attenuation likely secondary to microangiopathy. Vascular: Cerebrovascular atherosclerotic calcifications are noted. Skull: Negative for fracture or focal lesion. Sinuses/Orbits: Visualized portions of the orbits are unremarkable. Visualized portions of the paranasal sinuses and mastoid air cells are unremarkable. Other: None. CT CERVICAL SPINE FINDINGS Alignment: 3 mm anterolisthesis of C4 on C5 secondary to facet disease. Skull base and vertebrae: No acute fracture. No primary bone lesion or focal pathologic process. Soft tissues and spinal canal: No prevertebral fluid or swelling. No visible canal hematoma. Disc levels: Degenerative disc disease with severe disc height loss at C5-6 and C6-7. Mild degenerative disc disease with disc height loss at T1-2. Diffuse  bilateral facet arthropathy throughout the cervical spine. Left foraminal stenosis at C4-5. Upper chest: Mild left apical fibrosis. Other: Bilateral carotid artery atherosclerosis. IMPRESSION: 1. No acute intracranial pathology. 2.  No acute osseous injury of the cervical spine. Electronically Signed   By: Kathreen Devoid   On: 03/27/2017 10:31   Dg Hip Unilat With Pelvis 2-3 Views Left  Result Date: 03/27/2017 CLINICAL DATA:  Fall.  Left hip pain EXAM: DG HIP (WITH  OR WITHOUT PELVIS) 2-3V LEFT COMPARISON:  03/25/2017 FINDINGS: Again seen are fractures through the left superior and inferior pubic rami, unchanged since prior study. No proximal femoral fracture. Mild degenerative changes in the hips bilaterally. IMPRESSION: Stable left superior and inferior pubic rami fractures. Electronically Signed   By: Rolm Baptise M.D.   On: 03/27/2017 11:03   Dg Hip Unilat W Or Wo Pelvis 2-3 Views Left  Result Date: 03/25/2017 CLINICAL DATA:  Left hip pain and bruising, initial encounter EXAM: DG HIP (WITH OR WITHOUT PELVIS) 2-3V LEFT COMPARISON:  03/16/2017 FINDINGS: There are fractures better visualize through the superior and inferior pubic rami on the left. No other fracture or dislocation is seen. Degenerative changes of the hip joints and lumbar spine are noted. Proximal femur is unremarkable. IMPRESSION: Fractures through the superior and inferior pubic rami on the left. Electronically Signed   By: Inez Catalina M.D.   On: 03/25/2017 18:31    Procedures Procedures (including critical care time)  Medications Ordered in ED Medications  acetaminophen (TYLENOL) tablet 650 mg (650 mg Oral Given 03/27/17 1116)  oxyCODONE (Oxy IR/ROXICODONE) immediate release tablet 2.5 mg (2.5 mg Oral Given 03/27/17 1218)     Initial Impression / Assessment and Plan / ED Course  I have reviewed the triage vital signs and the nursing notes.  Pertinent labs & imaging results that were available during my care of the patient were  reviewed by me and considered in my medical decision making (see chart for details).     Patient presents to ED for evaluation of left hip pain after fall earlier today from bed. She was seen here twice in the past 2 weeks for similar hip pain after previous mechanical fall. Due to her dementia, difficult to obtain ROS. On my exam she states that she "hurts all over" with headache and neck pain. However to EMS and nurse she states that only her hip is hurting. They state that patient did not hit head. Cranial nerves appear grossly intact on physical exam. She has no visible signs of head trauma or neck trauma present. CT of the spine and head returned as negative for acute abnormality, fracture dislocation. On x-rays of left hip previously showed fractures through superior and inferior pubic rami on the left. Pain control here in the ED with Tylenol. Patient had follow-up appointment with orthopedic surgery scheduled in one week. Advised patient to continue taking Tylenol for pain and will give low-dose oxycodone to be taken as needed. Patient advised to follow-up with orthopedic surgery as directed. Patient should be nonweightbearing due to the fractures. We'll dispo back to nursing facility which already has physical therapy.  Final Clinical Impressions(s) / ED Diagnoses   Final diagnoses:  Fall, initial encounter  Left hip pain    New Prescriptions New Prescriptions   OXYCODONE (ROXICODONE) 5 MG IMMEDIATE RELEASE TABLET    Take 0.5 tablets (2.5 mg total) by mouth every 6 (six) hours as needed for severe pain.     Delia Heady, PA-C 03/27/17 [place], [place], DO 03/27/17 1324

## 2017-03-30 DIAGNOSIS — S72142A Displaced intertrochanteric fracture of left femur, initial encounter for closed fracture: Secondary | ICD-10-CM | POA: Diagnosis not present

## 2017-03-30 DIAGNOSIS — M25552 Pain in left hip: Secondary | ICD-10-CM | POA: Diagnosis not present

## 2017-03-30 DIAGNOSIS — R2689 Other abnormalities of gait and mobility: Secondary | ICD-10-CM | POA: Diagnosis not present

## 2017-03-31 DIAGNOSIS — S32512A Fracture of superior rim of left pubis, initial encounter for closed fracture: Secondary | ICD-10-CM | POA: Diagnosis not present

## 2017-03-31 DIAGNOSIS — M545 Low back pain: Secondary | ICD-10-CM | POA: Diagnosis not present

## 2017-03-31 DIAGNOSIS — S3289XS Fracture of other parts of pelvis, sequela: Secondary | ICD-10-CM | POA: Diagnosis not present

## 2017-03-31 DIAGNOSIS — M25552 Pain in left hip: Secondary | ICD-10-CM | POA: Diagnosis not present

## 2017-04-01 DIAGNOSIS — M25552 Pain in left hip: Secondary | ICD-10-CM | POA: Diagnosis not present

## 2017-04-07 DIAGNOSIS — I959 Hypotension, unspecified: Secondary | ICD-10-CM | POA: Diagnosis not present

## 2017-04-07 DIAGNOSIS — Z79899 Other long term (current) drug therapy: Secondary | ICD-10-CM | POA: Diagnosis not present

## 2017-04-07 DIAGNOSIS — E559 Vitamin D deficiency, unspecified: Secondary | ICD-10-CM | POA: Diagnosis not present

## 2017-04-07 DIAGNOSIS — M810 Age-related osteoporosis without current pathological fracture: Secondary | ICD-10-CM | POA: Diagnosis not present

## 2017-04-14 DIAGNOSIS — M791 Myalgia: Secondary | ICD-10-CM | POA: Diagnosis not present

## 2017-04-14 DIAGNOSIS — S32512D Fracture of superior rim of left pubis, subsequent encounter for fracture with routine healing: Secondary | ICD-10-CM | POA: Diagnosis not present

## 2017-04-17 DIAGNOSIS — I110 Hypertensive heart disease with heart failure: Secondary | ICD-10-CM | POA: Diagnosis not present

## 2017-04-17 DIAGNOSIS — I5022 Chronic systolic (congestive) heart failure: Secondary | ICD-10-CM | POA: Diagnosis not present

## 2017-04-17 DIAGNOSIS — S329XXD Fracture of unspecified parts of lumbosacral spine and pelvis, subsequent encounter for fracture with routine healing: Secondary | ICD-10-CM | POA: Diagnosis not present

## 2017-04-21 DIAGNOSIS — G309 Alzheimer's disease, unspecified: Secondary | ICD-10-CM | POA: Diagnosis not present

## 2017-04-21 DIAGNOSIS — H6123 Impacted cerumen, bilateral: Secondary | ICD-10-CM | POA: Diagnosis not present

## 2017-04-21 DIAGNOSIS — I5022 Chronic systolic (congestive) heart failure: Secondary | ICD-10-CM | POA: Diagnosis not present

## 2017-04-21 DIAGNOSIS — H903 Sensorineural hearing loss, bilateral: Secondary | ICD-10-CM | POA: Diagnosis not present

## 2017-04-21 DIAGNOSIS — I110 Hypertensive heart disease with heart failure: Secondary | ICD-10-CM | POA: Diagnosis not present

## 2017-04-21 DIAGNOSIS — S329XXD Fracture of unspecified parts of lumbosacral spine and pelvis, subsequent encounter for fracture with routine healing: Secondary | ICD-10-CM | POA: Diagnosis not present

## 2017-04-22 DIAGNOSIS — G4709 Other insomnia: Secondary | ICD-10-CM | POA: Diagnosis not present

## 2017-04-22 DIAGNOSIS — E876 Hypokalemia: Secondary | ICD-10-CM | POA: Diagnosis not present

## 2017-04-22 DIAGNOSIS — G308 Other Alzheimer's disease: Secondary | ICD-10-CM | POA: Diagnosis not present

## 2017-04-23 DIAGNOSIS — S329XXD Fracture of unspecified parts of lumbosacral spine and pelvis, subsequent encounter for fracture with routine healing: Secondary | ICD-10-CM | POA: Diagnosis not present

## 2017-04-23 DIAGNOSIS — I5022 Chronic systolic (congestive) heart failure: Secondary | ICD-10-CM | POA: Diagnosis not present

## 2017-04-23 DIAGNOSIS — I110 Hypertensive heart disease with heart failure: Secondary | ICD-10-CM | POA: Diagnosis not present

## 2017-04-27 DIAGNOSIS — I110 Hypertensive heart disease with heart failure: Secondary | ICD-10-CM | POA: Diagnosis not present

## 2017-04-27 DIAGNOSIS — I5022 Chronic systolic (congestive) heart failure: Secondary | ICD-10-CM | POA: Diagnosis not present

## 2017-04-27 DIAGNOSIS — S329XXD Fracture of unspecified parts of lumbosacral spine and pelvis, subsequent encounter for fracture with routine healing: Secondary | ICD-10-CM | POA: Diagnosis not present

## 2017-04-28 DIAGNOSIS — R739 Hyperglycemia, unspecified: Secondary | ICD-10-CM | POA: Diagnosis not present

## 2017-04-28 DIAGNOSIS — E876 Hypokalemia: Secondary | ICD-10-CM | POA: Diagnosis not present

## 2017-04-28 DIAGNOSIS — G4709 Other insomnia: Secondary | ICD-10-CM | POA: Diagnosis not present

## 2017-04-28 DIAGNOSIS — D509 Iron deficiency anemia, unspecified: Secondary | ICD-10-CM | POA: Diagnosis not present

## 2017-04-29 ENCOUNTER — Inpatient Hospital Stay (HOSPITAL_COMMUNITY)
Admission: EM | Admit: 2017-04-29 | Discharge: 2017-05-02 | DRG: 389 | Disposition: A | Discharge status: Home / Self Care | Payer: Medicare Other | Attending: Internal Medicine | Admitting: Internal Medicine

## 2017-04-29 ENCOUNTER — Emergency Department (HOSPITAL_COMMUNITY): Payer: Medicare Other

## 2017-04-29 ENCOUNTER — Encounter (HOSPITAL_COMMUNITY): Payer: Self-pay | Admitting: Emergency Medicine

## 2017-04-29 DIAGNOSIS — Z8711 Personal history of peptic ulcer disease: Secondary | ICD-10-CM

## 2017-04-29 DIAGNOSIS — K449 Diaphragmatic hernia without obstruction or gangrene: Secondary | ICD-10-CM | POA: Diagnosis present

## 2017-04-29 DIAGNOSIS — I5022 Chronic systolic (congestive) heart failure: Secondary | ICD-10-CM | POA: Diagnosis not present

## 2017-04-29 DIAGNOSIS — I350 Nonrheumatic aortic (valve) stenosis: Secondary | ICD-10-CM | POA: Diagnosis present

## 2017-04-29 DIAGNOSIS — Z7952 Long term (current) use of systemic steroids: Secondary | ICD-10-CM

## 2017-04-29 DIAGNOSIS — Z66 Do not resuscitate: Secondary | ICD-10-CM | POA: Diagnosis not present

## 2017-04-29 DIAGNOSIS — D62 Acute posthemorrhagic anemia: Secondary | ICD-10-CM | POA: Diagnosis present

## 2017-04-29 DIAGNOSIS — K922 Gastrointestinal hemorrhage, unspecified: Secondary | ICD-10-CM | POA: Diagnosis not present

## 2017-04-29 DIAGNOSIS — K811 Chronic cholecystitis: Secondary | ICD-10-CM | POA: Diagnosis not present

## 2017-04-29 DIAGNOSIS — M25559 Pain in unspecified hip: Secondary | ICD-10-CM | POA: Diagnosis not present

## 2017-04-29 DIAGNOSIS — K810 Acute cholecystitis: Secondary | ICD-10-CM | POA: Diagnosis not present

## 2017-04-29 DIAGNOSIS — D649 Anemia, unspecified: Secondary | ICD-10-CM | POA: Diagnosis present

## 2017-04-29 DIAGNOSIS — G8929 Other chronic pain: Secondary | ICD-10-CM | POA: Diagnosis not present

## 2017-04-29 DIAGNOSIS — R1032 Left lower quadrant pain: Secondary | ICD-10-CM | POA: Diagnosis not present

## 2017-04-29 DIAGNOSIS — F329 Major depressive disorder, single episode, unspecified: Secondary | ICD-10-CM | POA: Diagnosis present

## 2017-04-29 DIAGNOSIS — E876 Hypokalemia: Secondary | ICD-10-CM | POA: Diagnosis present

## 2017-04-29 DIAGNOSIS — F0391 Unspecified dementia with behavioral disturbance: Secondary | ICD-10-CM | POA: Diagnosis present

## 2017-04-29 DIAGNOSIS — K5641 Fecal impaction: Principal | ICD-10-CM | POA: Diagnosis present

## 2017-04-29 DIAGNOSIS — Z79899 Other long term (current) drug therapy: Secondary | ICD-10-CM

## 2017-04-29 DIAGNOSIS — R7989 Other specified abnormal findings of blood chemistry: Secondary | ICD-10-CM | POA: Diagnosis present

## 2017-04-29 DIAGNOSIS — K828 Other specified diseases of gallbladder: Secondary | ICD-10-CM | POA: Diagnosis not present

## 2017-04-29 DIAGNOSIS — K921 Melena: Secondary | ICD-10-CM | POA: Diagnosis present

## 2017-04-29 DIAGNOSIS — I10 Essential (primary) hypertension: Secondary | ICD-10-CM | POA: Diagnosis not present

## 2017-04-29 DIAGNOSIS — Z87891 Personal history of nicotine dependence: Secondary | ICD-10-CM | POA: Diagnosis not present

## 2017-04-29 DIAGNOSIS — K5900 Constipation, unspecified: Secondary | ICD-10-CM | POA: Diagnosis present

## 2017-04-29 DIAGNOSIS — I110 Hypertensive heart disease with heart failure: Secondary | ICD-10-CM | POA: Diagnosis not present

## 2017-04-29 DIAGNOSIS — Z8719 Personal history of other diseases of the digestive system: Secondary | ICD-10-CM | POA: Diagnosis not present

## 2017-04-29 DIAGNOSIS — R1011 Right upper quadrant pain: Secondary | ICD-10-CM

## 2017-04-29 DIAGNOSIS — Z91048 Other nonmedicinal substance allergy status: Secondary | ICD-10-CM | POA: Diagnosis not present

## 2017-04-29 DIAGNOSIS — R109 Unspecified abdominal pain: Secondary | ICD-10-CM | POA: Diagnosis not present

## 2017-04-29 DIAGNOSIS — K819 Cholecystitis, unspecified: Secondary | ICD-10-CM | POA: Diagnosis not present

## 2017-04-29 DIAGNOSIS — F419 Anxiety disorder, unspecified: Secondary | ICD-10-CM | POA: Diagnosis present

## 2017-04-29 DIAGNOSIS — F03918 Unspecified dementia, unspecified severity, with other behavioral disturbance: Secondary | ICD-10-CM | POA: Diagnosis present

## 2017-04-29 DIAGNOSIS — E785 Hyperlipidemia, unspecified: Secondary | ICD-10-CM | POA: Diagnosis present

## 2017-04-29 DIAGNOSIS — F1011 Alcohol abuse, in remission: Secondary | ICD-10-CM | POA: Diagnosis present

## 2017-04-29 DIAGNOSIS — S329XXD Fracture of unspecified parts of lumbosacral spine and pelvis, subsequent encounter for fracture with routine healing: Secondary | ICD-10-CM | POA: Diagnosis not present

## 2017-04-29 LAB — COMPREHENSIVE METABOLIC PANEL
ALBUMIN: 3.8 g/dL (ref 3.5–5.0)
ALK PHOS: 602 U/L — AB (ref 38–126)
ALT: 390 U/L — ABNORMAL HIGH (ref 14–54)
AST: 249 U/L — ABNORMAL HIGH (ref 15–41)
Anion gap: 8 (ref 5–15)
BILIRUBIN TOTAL: 0.7 mg/dL (ref 0.3–1.2)
BUN: 19 mg/dL (ref 6–20)
CALCIUM: 9.6 mg/dL (ref 8.9–10.3)
CO2: 29 mmol/L (ref 22–32)
CREATININE: 1.09 mg/dL — AB (ref 0.44–1.00)
Chloride: 104 mmol/L (ref 101–111)
GFR calc Af Amer: 52 mL/min — ABNORMAL LOW (ref >60)
GFR, EST NON AFRICAN AMERICAN: 45 mL/min — AB (ref >60)
Glucose, Bld: 91 mg/dL (ref 65–99)
POTASSIUM: 3.9 mmol/L (ref 3.5–5.1)
Sodium: 141 mmol/L (ref 135–145)
TOTAL PROTEIN: 6.6 g/dL (ref 6.5–8.1)

## 2017-04-29 LAB — CBC
HEMATOCRIT: 41.1 % (ref 36.0–46.0)
HEMATOCRIT: 37 % (ref 36.0–46.0)
HEMOGLOBIN: 12.1 g/dL (ref 12.0–15.0)
Hemoglobin: 13.2 g/dL (ref 12.0–15.0)
MCH: 29.3 pg (ref 26.0–34.0)
MCH: 29.2 pg (ref 26.0–34.0)
MCHC: 32.1 g/dL (ref 30.0–36.0)
MCHC: 32.7 g/dL (ref 30.0–36.0)
MCV: 91.1 fL (ref 78.0–100.0)
MCV: 89.2 fL (ref 78.0–100.0)
PLATELETS: 320 10*3/uL (ref 150–400)
Platelets: 286 10*3/uL (ref 150–400)
RBC: 4.15 MIL/uL (ref 3.87–5.11)
RBC: 4.51 MIL/uL (ref 3.87–5.11)
RDW: 15.8 % — ABNORMAL HIGH (ref 11.5–15.5)
RDW: 15.9 % — AB (ref 11.5–15.5)
WBC: 9.8 10*3/uL (ref 4.0–10.5)
WBC: 9.9 10*3/uL (ref 4.0–10.5)

## 2017-04-29 LAB — TYPE AND SCREEN
ABO/RH(D): O POS
ANTIBODY SCREEN: NEGATIVE

## 2017-04-29 LAB — I-STAT CG4 LACTIC ACID, ED: Lactic Acid, Venous: 0.92 mmol/L (ref 0.5–1.9)

## 2017-04-29 LAB — LIPASE, BLOOD: Lipase: 36 U/L (ref 11–51)

## 2017-04-29 LAB — POC OCCULT BLOOD, ED: Fecal Occult Bld: POSITIVE — AB

## 2017-04-29 MED ORDER — PIPERACILLIN-TAZOBACTAM 3.375 G IVPB 30 MIN
3.3750 g | Freq: Once | INTRAVENOUS | Status: AC
  Administered 2017-04-29: 3.375 g via INTRAVENOUS
  Filled 2017-04-29: qty 50

## 2017-04-29 MED ORDER — SODIUM CHLORIDE 0.9 % IV SOLN
Freq: Once | INTRAVENOUS | Status: AC
  Administered 2017-04-29: via INTRAVENOUS

## 2017-04-29 MED ORDER — IOPAMIDOL (ISOVUE-300) INJECTION 61%
100.0000 mL | Freq: Once | INTRAVENOUS | Status: AC | PRN
  Administered 2017-04-29: 100 mL via INTRAVENOUS

## 2017-04-29 MED ORDER — SODIUM CHLORIDE 0.9 % IV SOLN
80.0000 mg | Freq: Once | INTRAVENOUS | Status: AC
  Administered 2017-04-30: 02:00:00 80 mg via INTRAVENOUS
  Filled 2017-04-29: qty 80

## 2017-04-29 MED ORDER — IOPAMIDOL (ISOVUE-300) INJECTION 61%
INTRAVENOUS | Status: AC
  Filled 2017-04-29: qty 100

## 2017-04-29 NOTE — Progress Notes (Signed)
A consult was received from an ED physician for zosyn per pharmacy dosing.  The patient's profile has been reviewed for ht/wt/allergies/indication/available labs.   A one time order has been placed for zosyn 3.375gm iv x1.  Further antibiotics/pharmacy consults should be ordered by admitting physician if indicated.                       Thank you,  Cyndia Diver PharmD, BCPS  04/29/2017, 11:13 PM   .@date  .@time 

## 2017-04-29 NOTE — ED Provider Notes (Signed)
[facility] DEPT Provider Note   CSN: [id number] Arrival date & time: 04/29/17  1750     History   Chief Complaint Chief Complaint  Patient presents with  . Blood In Stools    HPI Jamie [name] is a 83 y.o. female.  HPI  83 yo F with h/o HTN, HLD, gastric ulcer here with GI bleeding. History limited 2/2 dementia. Per report from SNF, pt began having bright red blood/maroon colored stools per rectum today. She was well prior to this episode. No blood thinner use. No emesis. Pt currently is without complaints.   Level 5 caveat invoked as remainder of history, ROS, and physical exam limited due to patient's dementia.   Past Medical History:  Diagnosis Date  . Anxiety   . Chronic pain   . Depression   . Gastric ulcer 05/2011  . Hyperlipidemia   . Hypertension   . Obesity    5'3"    Patient Active Problem List   Diagnosis Date Noted  . Obstipation 04/30/2017  . Aortic stenosis 06/22/2015  . Hypotension 06/21/2015  . Chest pain 06/21/2015  . Anemia associated with acute blood loss   . AKI (acute kidney injury) ([place])   . Tachypnea   . Hypoxia 04/18/2015  . Acute respiratory failure with hypoxia and hypercapnia ([place]) 04/17/2015  . Acute respiratory failure with hypoxia ([place]) 04/17/2015  . HCAP (healthcare-associated pneumonia) 04/17/2015  . Acute-on-chronic kidney injury ([place]) 04/17/2015  . Anemia 04/17/2015  . Sepsis ([place]) 04/17/2015  . Cellulitis of leg, right 01/01/2015  . Acute blood loss anemia 01/01/2015  . Elevated troponin 01/01/2015  . Hypokalemia 01/01/2015  . Depression 01/01/2015  . GI bleed 12/27/2014  . Bleeding gastrointestinal   . Hemorrhagic shock ([place])   . Alcohol dependence with uncomplicated withdrawal ([place])   . Alcohol dependence ([place]) 09/30/2014  . Dementia with behavioral disturbance 09/29/2014  . Delusions ([place]) 09/29/2014  . Dementia 12/13/2013  . GI bleeding 08/12/2012  . Obesity   . Chronic pain   . Gastric ulcer 05/03/2011  .  Bladder spasm 02/10/2011  . Murmur, cardiac 11/20/2010  . SHOULDER PAIN, RIGHT 04/15/2010  . LOC OSTEOARTHROS NOT SPEC PRIM/SEC LOWER LEG 04/10/2010  . CALF PAIN, RIGHT 03/01/2010  . CERVICAL STRAIN, ACUTE 02/25/2010  . ANXIETY STATE, UNSPECIFIED 06/20/2009  . SEBACEOUS CYST, INFECTED 06/20/2009  . ALLERGIC RHINITIS DUE TO OTHER ALLERGEN 11/17/2008  . TACHYCARDIA 05/16/2008  . CONTUSION OF BREAST 05/05/2008  . HYPERCALCEMIA 01/20/2008  . EPISTAXIS 12/30/2007  . VITAMIN B12 DEFICIENCY 11/23/2007  . APHTHOUS ULCERS 11/23/2007  . ASYMPTOMATIC POSTMENOPAUSAL STATUS 08/18/2007  . Essential hypertension 05/10/2007  . ADVEF, DRUG/MEDICINAL/BIOLOGICAL SUBST NOS 05/10/2007  . Hyperlipemia 02/23/2007    Past Surgical History:  Procedure Laterality Date  . ABDOMINAL HYSTERECTOMY     PARTIAL  . BILATERAL OOPHORECTOMY    . ESOPHAGOGASTRODUODENOSCOPY  08/12/2012   Procedure: ESOPHAGOGASTRODUODENOSCOPY (EGD);  Surgeon: Arta Silence, MD;  Location: [facility];  Service: Endoscopy;  Laterality: Left;  . ESOPHAGOGASTRODUODENOSCOPY (EGD) WITH PROPOFOL N/A 12/28/2014   Procedure: ESOPHAGOGASTRODUODENOSCOPY (EGD) WITH PROPOFOL;  Surgeon: Wilford Corner, MD;  Location: [facility];  Service: Endoscopy;  Laterality: N/A;  . skin biopsy     nose lesion, noncancerous    OB History    No data available       Home Medications    Prior to Admission medications   Medication Sig Start Date End Date Taking? Authorizing Provider  acetaminophen (TYLENOL) 325 MG suppository Place 650 mg rectally every  4 (four) hours as needed (oral temp over 101).   Yes [provider]  acetaminophen (TYLENOL) 500 MG tablet Take 1,000 mg by mouth every 8 (eight) hours.   Yes [provider]  ARIPiprazole (ABILIFY) 5 MG tablet Take 1 tablet (5 mg total) by mouth at bedtime. 06/24/15  Yes Charlynne Cousins, MD  busPIRone (BUSPAR) 7.5 MG tablet Take 7.5 mg by mouth 2 (two) times daily.   Yes  [provider]  calcium carbonate (OS-CAL - DOSED IN MG OF ELEMENTAL CALCIUM) 1250 (500 Ca) MG tablet Take 1 tablet by mouth 2 (two) times daily with a meal.   Yes [provider]  furosemide (LASIX) 40 MG tablet Take 1 tablet (40 mg total) by mouth daily. 06/24/15  Yes Charlynne Cousins, MD  gabapentin (NEURONTIN) 100 MG capsule Take 1 capsule (100 mg total) by mouth at bedtime. 06/24/15  Yes Charlynne Cousins, MD  Hypromellose (ISOPTO TEARS) 0.5 % SOLN Apply 1 drop to eye 3 (three) times daily. One drop in each eye, three times daily. Patient taking differently: Place 1 drop into both eyes 3 (three) times daily.  06/24/15  Yes Charlynne Cousins, MD  magnesium oxide (MAG-OX) 400 MG tablet Take 400 mg by mouth daily.   Yes [provider]  Melatonin 1 MG TABS Take 1 mg by mouth at bedtime.    Yes [provider]  olopatadine (PATANOL) 0.1 % ophthalmic solution Place 1 drop into both eyes 2 (two) times daily. 06/24/15  Yes Charlynne Cousins, MD  oxyCODONE (ROXICODONE) 5 MG immediate release tablet Take 0.5 tablets (2.5 mg total) by mouth every 6 (six) hours as needed for severe pain. Patient taking differently: Take 2.5 mg by mouth every 4 (four) hours as needed for severe pain.  03/27/17  Yes Khatri, Hina, PA-C  pantoprazole (PROTONIX) 40 MG tablet Take 1 tablet (40 mg total) by mouth 2 (two) times daily before a meal. Patient taking differently: Take 40 mg by mouth daily.  06/24/15  Yes Charlynne Cousins, MD  polyethylene glycol [facility] Floria Raveling) packet Take 17 g by mouth daily. 06/24/15  Yes Charlynne Cousins, MD  potassium chloride SA (K-DUR,KLOR-CON) 20 MEQ tablet Take 60 mEq by mouth daily.   Yes [provider]  predniSONE (DELTASONE) 5 MG tablet Take 1 tablet (5 mg total) by mouth daily with breakfast. Patient taking differently: Take 10 mg by mouth daily with breakfast.  06/24/15  Yes Charlynne Cousins, MD  senna (SENOKOT)  8.6 MG TABS tablet Take 1 tablet (8.6 mg total) by mouth at bedtime. 06/24/15  Yes Charlynne Cousins, MD  senna-docusate (SENOKOT-S) 8.6-50 MG tablet Take 1 tablet by mouth 2 (two) times daily as needed for mild constipation.   Yes [provider]  sertraline (ZOLOFT) 50 MG tablet Take 1 tablet (50 mg total) by mouth daily. 06/24/15  Yes Charlynne Cousins, MD  traMADol (ULTRAM) 50 MG tablet Take 50 mg by mouth every 8 (eight) hours as needed (for pain).   Yes [provider]  cephALEXin (KEFLEX) 250 MG capsule Take 1 capsule (250 mg total) by mouth 2 (two) times daily. Patient not taking: Reported on 03/25/2017 03/16/17   Dorie Rank, MD  fluticasone [facility]) 50 MCG/ACT nasal spray Place 2 sprays into both nostrils daily. Patient not taking: Reported on 03/16/2017 06/24/15   Charlynne Cousins, MD  potassium chloride (K-DUR) 10 MEQ tablet Take 2 tablets (20 mEq total) by mouth  daily. Patient not taking: Reported on 03/16/2017 06/24/15   Charlynne Cousins, MD  risperiDONE (RISPERDAL) 0.5 MG tablet Take 1-2 tablets (0.5-1 mg total) by mouth 2 (two) times daily. 1 tab in the morning and 2 tabs at bedtime Patient not taking: Reported on 03/16/2017 06/24/15   Charlynne Cousins, MD  risperiDONE (RISPERDAL) 1 MG tablet Take 1 tablet (1 mg total) by mouth at bedtime. Patient not taking: Reported on 03/16/2017 06/24/15   Charlynne Cousins, MD    Family History Family History  Problem Relation Age of Onset  . Hypertension Mother   . Hyperlipidemia Mother   . Heart disease Father        63s  . Cancer Brother     Social History Social History  Substance Use Topics  . Smoking status: Former Smoker    Packs/day: 1.00    Types: Cigarettes    Quit date: 09/02/1971  . Smokeless tobacco: Never Used  . Alcohol use Yes     Comment: occasionally     Allergies   Pollen extract   Review of Systems Review of Systems  Unable to perform ROS: Dementia  Gastrointestinal:  Positive for blood in stool.     Physical Exam Updated Vital Signs BP (!) 139/57   Pulse 84   Temp 98.4 F (36.9 C) (Oral)   Resp (!) 21   SpO2 98%   Physical Exam  Constitutional: She appears well-developed and well-nourished. No distress.  HENT:  Head: Normocephalic and atraumatic.  Eyes: Conjunctivae are normal.  Neck: Neck supple.  Cardiovascular: Normal rate, regular rhythm and normal heart sounds.  Exam reveals no friction rub.   No murmur heard. Pulmonary/Chest: Effort normal and breath sounds normal. No respiratory distress. She has no wheezes. She has no rales.  Abdominal: She exhibits no distension.  Genitourinary:  Genitourinary Comments: Maroon colored stool in rectal vault. No active bleeding.  Musculoskeletal: She exhibits no edema.  Neurological: She exhibits normal muscle tone.  Pleasant, calm but not oriented to person, place, or time.  Skin: Skin is warm. Capillary refill takes less than 2 seconds.  Psychiatric: She has a normal mood and affect.  Nursing note and vitals reviewed.    ED Treatments / Results  Labs (all labs ordered are listed, but only abnormal results are displayed) Labs Reviewed  COMPREHENSIVE METABOLIC PANEL - Abnormal; Notable for the following:       Result Value   Creatinine, Ser 1.09 (*)    AST 249 (*)    ALT 390 (*)    Alkaline Phosphatase 602 (*)    GFR calc non Af Amer 45 (*)    GFR calc Af Amer 52 (*)    All other components within normal limits  CBC - Abnormal; Notable for the following:    RDW 15.9 (*)    All other components within normal limits  CBC - Abnormal; Notable for the following:    RDW 15.8 (*)    All other components within normal limits  CBC - Abnormal; Notable for the following:    Hemoglobin 11.6 (*)    RDW 16.1 (*)    All other components within normal limits  COMPREHENSIVE METABOLIC PANEL - Abnormal; Notable for the following:    Glucose, Bld 109 (*)    Calcium 8.8 (*)    Total Protein 5.3 (*)      Albumin 2.9 (*)    AST 126 (*)    ALT 273 (*)    Alkaline  Phosphatase 482 (*)    GFR calc non Af Amer 54 (*)    All other components within normal limits  POC OCCULT BLOOD, ED - Abnormal; Notable for the following:    Fecal Occult Bld POSITIVE (*)    All other components within normal limits  LIPASE, BLOOD  MAGNESIUM  PHOSPHORUS  TSH  I-STAT CG4 LACTIC ACID, ED  TYPE AND SCREEN    EKG  EKG Interpretation None       Radiology Ct Abdomen Pelvis W Contrast  Result Date: 04/29/2017 CLINICAL DATA:  Bright red blood in stool. Abdominal pain and distension. Gastric ulcer, dementia. Assess for colitis. EXAM: CT ABDOMEN AND PELVIS WITH CONTRAST TECHNIQUE: Multidetector CT imaging of the abdomen and pelvis was performed using the standard protocol following bolus administration of intravenous contrast. CONTRAST:  122mL ISOVUE-300 IOPAMIDOL (ISOVUE-300) INJECTION 61% COMPARISON:  CT abdomen and pelvis June 22, 2015 FINDINGS: LOWER CHEST: Dependent atelectasis and scarring. Heart size is normal. Annular calcifications. No pericardial effusion. HEPATOBILIARY: Stable 16 mm cyst in dome of the liver. Mild new intrahepatic biliary dilatation. Distended gallbladder with mild wall thickening. No CT findings of cholelithiasis. No extrahepatic biliary dilatation. PANCREAS: Normal. SPLEEN: Normal. ADRENALS/URINARY TRACT: Kidneys are orthotopic, demonstrating symmetric enhancement. 5 x 8 mm RIGHT and 6 x 10 mm LEFT renal pelvic calculi without hydronephrosis. Mild cortical atrophy bilaterally, RIGHT cortical scarring. Hydronephrosis or solid renal masses. The unopacified ureters are normal in course and caliber. Delayed imaging through the kidneys demonstrates symmetric prompt contrast excretion within the proximal urinary collecting system. Urinary bladder is well distended and unremarkable. Normal adrenal glands. STOMACH/BOWEL: Large hiatal hernia containing the majority of the stomach, superior aspect  out of field-of-view. Stool distended rectum at 7.3 cm. Moderate descending and sigmoid colonic diverticulosis. Moderate amount of retained large bowel stool. The appendix is not discretely identified, however there are no inflammatory changes in the right lower quadrant. VASCULAR/LYMPHATIC: Aortoiliac vessels are normal in course and caliber. Moderate to severe calcific atherosclerosis carotid bifurcations. No lymphadenopathy by CT size criteria. REPRODUCTIVE: Normal. OTHER: No intraperitoneal free fluid or free air. MUSCULOSKELETAL: Nonacute. Small fat containing umbilical hernia. Comminuted healing LEFT inferior and superior pubic rami fractures. Healing bilateral sacroiliac fractures. Minimal grade 1 L4-5 anterolisthesis on degenerative basis. IMPRESSION: 1. Diverticulosis without acute diverticulitis. Moderate retained large bowel stool with stool distended rectum equivocal for fecal impaction. No bowel obstruction. 2. New mild intrahepatic biliary dilatation with distended gallbladder, possible acute or chronic cholecystitis. Recommend ultrasound. 3. Bilateral nonobstructing nephrolithiasis. 4. Large hiatal hernia. 5. Multiple healing pelvic fractures. Aortic Atherosclerosis (ICD10-I70.0). Electronically Signed   By: Elon Alas M.D.   On: 04/29/2017 22:52   US Abdomen Limited Ruq  Result Date: 04/30/2017 CLINICAL DATA:  RIGHT upper quadrant pain. Possible cholecystitis on today's CT abdomen and pelvis. EXAM: ULTRASOUND ABDOMEN LIMITED RIGHT UPPER QUADRANT COMPARISON:  CT abdomen and pelvis April 29, 2017 FINDINGS: Habitus limited examination, patient was unable to cooperate with imaging directions. Gallbladder: Mild gallbladder distension. Multiple echogenic sludge ball measuring to 16 mm with acoustic enhancement. No pericholecystic fluid. Gallbladder wall thickening at 4 mm. No sonographic Murphy's sign elicited. Common bile duct: Diameter: 12 mm proximally, 6 mm distally.  No  choledocholithiasis. Liver: No focal lesion identified. Within normal limits in parenchymal echogenicity. Portal vein is patent on color Doppler imaging with normal direction of blood flow towards the liver. IMPRESSION: 1. Gallbladder sludge and wall thickening without sonographic Murphy sign. Findings suggest chronic cholecystitis. Electronically Signed  By: Elon Alas M.D.   On: 04/30/2017 00:34    Procedures Procedures (including critical care time)  Medications Ordered in ED Medications  iopamidol (ISOVUE-300) 61 % injection (not administered)  busPIRone (BUSPAR) tablet 7.5 mg (7.5 mg Oral Given 04/30/17 1115)  traMADol (ULTRAM) tablet 50 mg (not administered)  ARIPiprazole (ABILIFY) tablet 5 mg (not administered)  gabapentin (NEURONTIN) capsule 100 mg (not administered)  olopatadine (PATANOL) 0.1 % ophthalmic solution 1 drop (1 drop Both Eyes Given 04/30/17 1115)  sertraline (ZOLOFT) tablet 50 mg (50 mg Oral Given 04/30/17 1114)  acetaminophen (TYLENOL) tablet 650 mg (not administered)    Or  acetaminophen (TYLENOL) suppository 650 mg (not administered)  ondansetron (ZOFRAN) tablet 4 mg (not administered)    Or  ondansetron (ZOFRAN) injection 4 mg (not administered)  0.9 %  sodium chloride infusion ( Intravenous Stopped 04/30/17 1403)  hydrocortisone sodium succinate (SOLU-CORTEF) 100 MG injection 50 mg (50 mg Intravenous Given 04/30/17 0548)  pantoprazole (PROTONIX) 80 mg in sodium chloride 0.9 % 250 mL (0.32 mg/mL) infusion (8 mg/hr Intravenous New Bag/Given 04/30/17 0243)  pantoprazole (PROTONIX) injection 40 mg (not administered)  piperacillin-tazobactam (ZOSYN) IVPB 3.375 g (0 g Intravenous Stopped 04/30/17 1040)  iopamidol (ISOVUE-300) 61 % injection 100 mL (100 mLs Intravenous Contrast Given 04/29/17 2224)  piperacillin-tazobactam (ZOSYN) IVPB 3.375 g (0 g Intravenous Stopped 04/29/17 2351)  pantoprazole (PROTONIX) 80 mg in sodium chloride 0.9 % 100 mL IVPB (0 mg Intravenous  Stopped 04/30/17 0212)  0.9 %  sodium chloride infusion ( Intravenous Stopped 04/30/17 0528)     Initial Impression / Assessment and Plan / ED Course  I have reviewed the triage vital signs and the nursing notes.  Pertinent labs & imaging results that were available during my care of the patient were reviewed by me and considered in my medical decision making (see chart for details).     83 yo F with PMHx severe dementia, PUD here with GI bleed. On exam, pt has maroon-colored stools but abdomen is o/w completely soft, NT, and ND. Lab work shows stable Hgb, but also elevated LFTs and AlkP. CT scan c/f acute versus chronic cholecystitis, also significant diverticulosis which I suspect is etiology for her likely lower GI bleed. Regarding her possible choelcystitis, her exam is not consistent with this. Likely chronic. She is DNR and has previously stated that she would not desire invasive procedures. D/w Dr. Brantley Stage - will treat medically, monitor GIB, obtain U/S. I also contacted DSS, pt's guardian [name]) according to paperwork that arrived with pt. She will contact DSS regarding whether or not pt would desire surgery. IV ABX given, will admit to Hospitalist. No signs of HD instability. No fever or signs of sepsis.  Final Clinical Impressions(s) / ED Diagnoses   Final diagnoses:  RUQ pain  Lower GI bleed  Chronic cholecystitis    New Prescriptions New Prescriptions   No medications on file     Duffy Bruce, MD 04/30/17 1409

## 2017-04-29 NOTE — ED Triage Notes (Signed)
Per EMS pt from [facility] at [facility] for evaluation of blood in stool, bright red, started today; noted an hour ago. Pt hx of dementia; alert to self per normal.

## 2017-04-29 NOTE — ED Notes (Signed)
ED Provider at bedside. 

## 2017-04-29 NOTE — ED Notes (Signed)
Patient transported to CT 

## 2017-04-30 ENCOUNTER — Encounter (HOSPITAL_COMMUNITY): Payer: Self-pay | Admitting: Internal Medicine

## 2017-04-30 ENCOUNTER — Emergency Department (HOSPITAL_COMMUNITY): Payer: Medicare Other

## 2017-04-30 ENCOUNTER — Inpatient Hospital Stay (HOSPITAL_COMMUNITY): Payer: Medicare Other

## 2017-04-30 DIAGNOSIS — K819 Cholecystitis, unspecified: Secondary | ICD-10-CM

## 2017-04-30 DIAGNOSIS — K449 Diaphragmatic hernia without obstruction or gangrene: Secondary | ICD-10-CM | POA: Diagnosis present

## 2017-04-30 DIAGNOSIS — K5900 Constipation, unspecified: Secondary | ICD-10-CM

## 2017-04-30 DIAGNOSIS — R109 Unspecified abdominal pain: Secondary | ICD-10-CM | POA: Diagnosis not present

## 2017-04-30 DIAGNOSIS — G8929 Other chronic pain: Secondary | ICD-10-CM | POA: Diagnosis present

## 2017-04-30 DIAGNOSIS — K828 Other specified diseases of gallbladder: Secondary | ICD-10-CM | POA: Diagnosis not present

## 2017-04-30 DIAGNOSIS — Z66 Do not resuscitate: Secondary | ICD-10-CM | POA: Diagnosis present

## 2017-04-30 DIAGNOSIS — I350 Nonrheumatic aortic (valve) stenosis: Secondary | ICD-10-CM | POA: Diagnosis present

## 2017-04-30 DIAGNOSIS — D62 Acute posthemorrhagic anemia: Secondary | ICD-10-CM

## 2017-04-30 DIAGNOSIS — K810 Acute cholecystitis: Secondary | ICD-10-CM | POA: Diagnosis not present

## 2017-04-30 DIAGNOSIS — F0391 Unspecified dementia with behavioral disturbance: Secondary | ICD-10-CM | POA: Diagnosis not present

## 2017-04-30 DIAGNOSIS — D649 Anemia, unspecified: Secondary | ICD-10-CM

## 2017-04-30 DIAGNOSIS — Z8719 Personal history of other diseases of the digestive system: Secondary | ICD-10-CM | POA: Diagnosis not present

## 2017-04-30 DIAGNOSIS — Z7952 Long term (current) use of systemic steroids: Secondary | ICD-10-CM | POA: Diagnosis not present

## 2017-04-30 DIAGNOSIS — R945 Abnormal results of liver function studies: Secondary | ICD-10-CM | POA: Diagnosis not present

## 2017-04-30 DIAGNOSIS — Z8711 Personal history of peptic ulcer disease: Secondary | ICD-10-CM | POA: Diagnosis not present

## 2017-04-30 DIAGNOSIS — Z91048 Other nonmedicinal substance allergy status: Secondary | ICD-10-CM | POA: Diagnosis not present

## 2017-04-30 DIAGNOSIS — E876 Hypokalemia: Secondary | ICD-10-CM | POA: Diagnosis present

## 2017-04-30 DIAGNOSIS — E785 Hyperlipidemia, unspecified: Secondary | ICD-10-CM

## 2017-04-30 DIAGNOSIS — F1011 Alcohol abuse, in remission: Secondary | ICD-10-CM | POA: Diagnosis present

## 2017-04-30 DIAGNOSIS — I10 Essential (primary) hypertension: Secondary | ICD-10-CM | POA: Diagnosis present

## 2017-04-30 DIAGNOSIS — F419 Anxiety disorder, unspecified: Secondary | ICD-10-CM | POA: Diagnosis present

## 2017-04-30 DIAGNOSIS — Z79899 Other long term (current) drug therapy: Secondary | ICD-10-CM | POA: Diagnosis not present

## 2017-04-30 DIAGNOSIS — Z87891 Personal history of nicotine dependence: Secondary | ICD-10-CM | POA: Diagnosis not present

## 2017-04-30 DIAGNOSIS — K922 Gastrointestinal hemorrhage, unspecified: Secondary | ICD-10-CM

## 2017-04-30 DIAGNOSIS — R7989 Other specified abnormal findings of blood chemistry: Secondary | ICD-10-CM | POA: Diagnosis present

## 2017-04-30 DIAGNOSIS — K811 Chronic cholecystitis: Secondary | ICD-10-CM | POA: Diagnosis present

## 2017-04-30 DIAGNOSIS — K5641 Fecal impaction: Secondary | ICD-10-CM | POA: Diagnosis present

## 2017-04-30 DIAGNOSIS — F329 Major depressive disorder, single episode, unspecified: Secondary | ICD-10-CM | POA: Diagnosis present

## 2017-04-30 DIAGNOSIS — K921 Melena: Secondary | ICD-10-CM | POA: Diagnosis not present

## 2017-04-30 DIAGNOSIS — R1011 Right upper quadrant pain: Secondary | ICD-10-CM | POA: Diagnosis not present

## 2017-04-30 LAB — CBC
HEMATOCRIT: 36.3 % (ref 36.0–46.0)
HEMOGLOBIN: 11.6 g/dL — AB (ref 12.0–15.0)
MCH: 29 pg (ref 26.0–34.0)
MCHC: 32 g/dL (ref 30.0–36.0)
MCV: 90.8 fL (ref 78.0–100.0)
Platelets: 277 10*3/uL (ref 150–400)
RBC: 4 MIL/uL (ref 3.87–5.11)
RDW: 16.1 % — ABNORMAL HIGH (ref 11.5–15.5)
WBC: 9 10*3/uL (ref 4.0–10.5)

## 2017-04-30 LAB — COMPREHENSIVE METABOLIC PANEL
ALBUMIN: 2.9 g/dL — AB (ref 3.5–5.0)
ALK PHOS: 482 U/L — AB (ref 38–126)
ALT: 273 U/L — AB (ref 14–54)
AST: 126 U/L — ABNORMAL HIGH (ref 15–41)
Anion gap: 8 (ref 5–15)
BUN: 16 mg/dL (ref 6–20)
CALCIUM: 8.8 mg/dL — AB (ref 8.9–10.3)
CO2: 24 mmol/L (ref 22–32)
CREATININE: 0.94 mg/dL (ref 0.44–1.00)
Chloride: 107 mmol/L (ref 101–111)
GFR calc non Af Amer: 54 mL/min — ABNORMAL LOW (ref >60)
GLUCOSE: 109 mg/dL — AB (ref 65–99)
Potassium: 3.8 mmol/L (ref 3.5–5.1)
SODIUM: 139 mmol/L (ref 135–145)
Total Bilirubin: 0.9 mg/dL (ref 0.3–1.2)
Total Protein: 5.3 g/dL — ABNORMAL LOW (ref 6.5–8.1)

## 2017-04-30 LAB — PHOSPHORUS: Phosphorus: 3 mg/dL (ref 2.5–4.6)

## 2017-04-30 LAB — MAGNESIUM: Magnesium: 1.9 mg/dL (ref 1.7–2.4)

## 2017-04-30 LAB — MRSA PCR SCREENING: MRSA by PCR: NEGATIVE

## 2017-04-30 LAB — TSH: TSH: 0.483 u[IU]/mL (ref 0.350–4.500)

## 2017-04-30 MED ORDER — ONDANSETRON HCL 4 MG/2ML IJ SOLN: 4.0000 mg | Freq: Four times a day (QID) | INTRAMUSCULAR | Status: DC | PRN

## 2017-04-30 MED ORDER — ARIPIPRAZOLE 5 MG PO TABS
5.0000 mg | ORAL_TABLET | Freq: Every day | ORAL | Status: DC
  Administered 2017-04-30 – 2017-05-01 (×2): 5 mg via ORAL
  Filled 2017-04-30 (×3): qty 1

## 2017-04-30 MED ORDER — PIPERACILLIN-TAZOBACTAM 3.375 G IVPB
3.3750 g | Freq: Three times a day (TID) | INTRAVENOUS | Status: DC
  Administered 2017-04-30 – 2017-05-02 (×8): 3.375 g via INTRAVENOUS
  Filled 2017-04-30 (×10): qty 50

## 2017-04-30 MED ORDER — TECHNETIUM TC 99M-LABELED RED BLOOD CELLS IV KIT
25.3000 | PACK | Freq: Once | INTRAVENOUS | Status: AC | PRN
  Administered 2017-04-30: 25.3 via INTRAVENOUS

## 2017-04-30 MED ORDER — PIPERACILLIN-TAZOBACTAM 3.375 G IVPB 30 MIN: 3.3750 g | Freq: Three times a day (TID) | INTRAVENOUS | Status: DC

## 2017-04-30 MED ORDER — SODIUM CHLORIDE 0.9 % IV SOLN
8.0000 mg/h | INTRAVENOUS | Status: DC
  Administered 2017-04-30 – 2017-05-01 (×3): 8 mg/h via INTRAVENOUS
  Filled 2017-04-30 (×5): qty 80

## 2017-04-30 MED ORDER — HYDROCORTISONE NA SUCCINATE PF 100 MG IJ SOLR
50.0000 mg | Freq: Three times a day (TID) | INTRAMUSCULAR | Status: DC
  Administered 2017-04-30 – 2017-05-01 (×5): 50 mg via INTRAVENOUS
  Filled 2017-04-30 (×5): qty 2

## 2017-04-30 MED ORDER — PANTOPRAZOLE SODIUM 40 MG IV SOLR
40.0000 mg | Freq: Two times a day (BID) | INTRAVENOUS | Status: DC
Start: 2017-05-03 — End: 2017-05-01

## 2017-04-30 MED ORDER — SODIUM CHLORIDE 0.9 % IV SOLN
INTRAVENOUS | Status: AC
  Administered 2017-04-30: 05:00:00 via INTRAVENOUS

## 2017-04-30 MED ORDER — ACETAMINOPHEN 325 MG PO TABS: 650.0000 mg | ORAL_TABLET | Freq: Four times a day (QID) | ORAL | Status: DC | PRN

## 2017-04-30 MED ORDER — OLOPATADINE HCL 0.1 % OP SOLN
1.0000 [drp] | Freq: Two times a day (BID) | OPHTHALMIC | Status: DC
  Administered 2017-04-30 – 2017-05-02 (×5): 1 [drp] via OPHTHALMIC
  Filled 2017-04-30 (×2): qty 5

## 2017-04-30 MED ORDER — TRAMADOL HCL 50 MG PO TABS
50.0000 mg | ORAL_TABLET | Freq: Three times a day (TID) | ORAL | Status: DC | PRN
  Administered 2017-05-02: 50 mg via ORAL
  Filled 2017-04-30: qty 1

## 2017-04-30 MED ORDER — BUSPIRONE HCL 5 MG PO TABS
7.5000 mg | ORAL_TABLET | Freq: Two times a day (BID) | ORAL | Status: DC
  Administered 2017-04-30 – 2017-05-02 (×5): 7.5 mg via ORAL
  Filled 2017-04-30 (×2): qty 2
  Filled 2017-04-30: qty 1.5
  Filled 2017-04-30 (×2): qty 2

## 2017-04-30 MED ORDER — ONDANSETRON HCL 4 MG PO TABS: 4.0000 mg | ORAL_TABLET | Freq: Four times a day (QID) | ORAL | Status: DC | PRN

## 2017-04-30 MED ORDER — SERTRALINE HCL 50 MG PO TABS
50.0000 mg | ORAL_TABLET | Freq: Every day | ORAL | Status: DC
  Administered 2017-04-30 – 2017-05-02 (×3): 50 mg via ORAL
  Filled 2017-04-30 (×3): qty 1

## 2017-04-30 MED ORDER — ACETAMINOPHEN 650 MG RE SUPP: 650.0000 mg | Freq: Four times a day (QID) | RECTAL | Status: DC | PRN

## 2017-04-30 MED ORDER — GABAPENTIN 100 MG PO CAPS
100.0000 mg | ORAL_CAPSULE | Freq: Every day | ORAL | Status: DC
  Administered 2017-04-30 – 2017-05-01 (×2): 100 mg via ORAL
  Filled 2017-04-30 (×2): qty 1

## 2017-04-30 NOTE — Progress Notes (Signed)
Pt admitted after midnight, for details, please refer to admission note done 04/30/2017. Pt admitted with bright red blood per rectum., GI consulted. Hgb is 11.6. Continue protonix 40 mg IV Q 12 hours.  Leisa Lenz [facility] [place] [phone]

## 2017-04-30 NOTE — Consult Note (Signed)
[facility] Consult  Referring Provider: Toy Baker, MD Primary Care Physician:  Kennyth Arnold, FNP Primary Gastroenterologist: Dr.Buccini  Reason for Consultation:  Rectal bleeding  HPI: Jamie [name] is a 83 y.o. Caucasian female was referred from nursing home due to passage of bright red and maroon blood per rectum today morning. Patient states she has regular bowel movements, and has not noted blood in the stool or black stools until today morning. However, she has dementia and I'm not sure about reliability.  Patient otherwise denies nausea, vomiting, abdominal pain, difficulty or pain on swallowing. She reports a good appetite and denies unintentional weight loss. 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 Last colonoscopy was in 2012 and showed a few polyps in the cecal lipoma.  Past Medical History:  Diagnosis Date  . Anxiety   . Chronic pain   . Depression   . Gastric ulcer 05/2011  . Hyperlipidemia   . Hypertension   . Obesity    5'3"    Past Surgical History:  Procedure Laterality Date  . ABDOMINAL HYSTERECTOMY     PARTIAL  . BILATERAL OOPHORECTOMY    . ESOPHAGOGASTRODUODENOSCOPY  08/12/2012   Procedure: ESOPHAGOGASTRODUODENOSCOPY (EGD);  Surgeon: Arta Silence, MD;  Location: [facility];  Service: Endoscopy;  Laterality: Left;  . ESOPHAGOGASTRODUODENOSCOPY (EGD) WITH PROPOFOL N/A 12/28/2014   Procedure: ESOPHAGOGASTRODUODENOSCOPY (EGD) WITH PROPOFOL;  Surgeon: Wilford Corner, MD;  Location: [facility];  Service: Endoscopy;  Laterality: N/A;  . skin biopsy     nose lesion, noncancerous    Prior to Admission medications   Medication Sig Start Date End Date Taking? Authorizing Provider  acetaminophen (TYLENOL) 325 MG suppository Place 650 mg rectally every 4 (four) hours as needed (oral temp over 101).   Yes [provider]  acetaminophen (TYLENOL) 500 MG tablet Take  1,000 mg by mouth every 8 (eight) hours.   Yes [provider]  ARIPiprazole (ABILIFY) 5 MG tablet Take 1 tablet (5 mg total) by mouth at bedtime. 06/24/15  Yes Charlynne Cousins, MD  busPIRone (BUSPAR) 7.5 MG tablet Take 7.5 mg by mouth 2 (two) times daily.   Yes [provider]  calcium carbonate (OS-CAL - DOSED IN MG OF ELEMENTAL CALCIUM) 1250 (500 Ca) MG tablet Take 1 tablet by mouth 2 (two) times daily with a meal.   Yes [provider]  furosemide (LASIX) 40 MG tablet Take 1 tablet (40 mg total) by mouth daily. 06/24/15  Yes Charlynne Cousins, MD  gabapentin (NEURONTIN) 100 MG capsule Take 1 capsule (100 mg total) by mouth at bedtime. 06/24/15  Yes Charlynne Cousins, MD  Hypromellose (ISOPTO TEARS) 0.5 % SOLN Apply 1 drop to eye 3 (three) times daily. One drop in each eye, three times daily. Patient taking differently: Place 1 drop into both eyes 3 (three) times daily.  06/24/15  Yes Charlynne Cousins, MD  magnesium oxide (MAG-OX) 400 MG tablet Take 400 mg by mouth daily.   Yes [provider]  Melatonin 1 MG TABS Take 1 mg by mouth at bedtime.    Yes [provider]  olopatadine (PATANOL) 0.1 % ophthalmic solution Place 1 drop into both eyes 2 (two) times daily. 06/24/15  Yes Charlynne Cousins, MD  oxyCODONE (ROXICODONE) 5 MG immediate release tablet Take 0.5 tablets (2.5 mg total) by mouth every 6 (six) hours as needed for severe pain. Patient taking differently: Take 2.5 mg  by mouth every 4 (four) hours as needed for severe pain.  03/27/17  Yes Khatri, Hina, PA-C  pantoprazole (PROTONIX) 40 MG tablet Take 1 tablet (40 mg total) by mouth 2 (two) times daily before a meal. Patient taking differently: Take 40 mg by mouth daily.  06/24/15  Yes Charlynne Cousins, MD  polyethylene glycol [facility] Floria Raveling) packet Take 17 g by mouth daily. 06/24/15  Yes Charlynne Cousins, MD  potassium chloride SA (K-DUR,KLOR-CON) 20 MEQ tablet Take  60 mEq by mouth daily.   Yes [provider]  predniSONE (DELTASONE) 5 MG tablet Take 1 tablet (5 mg total) by mouth daily with breakfast. Patient taking differently: Take 10 mg by mouth daily with breakfast.  06/24/15  Yes Charlynne Cousins, MD  senna (SENOKOT) 8.6 MG TABS tablet Take 1 tablet (8.6 mg total) by mouth at bedtime. 06/24/15  Yes Charlynne Cousins, MD  senna-docusate (SENOKOT-S) 8.6-50 MG tablet Take 1 tablet by mouth 2 (two) times daily as needed for mild constipation.   Yes [provider]  sertraline (ZOLOFT) 50 MG tablet Take 1 tablet (50 mg total) by mouth daily. 06/24/15  Yes Charlynne Cousins, MD  traMADol (ULTRAM) 50 MG tablet Take 50 mg by mouth every 8 (eight) hours as needed (for pain).   Yes [provider]  cephALEXin (KEFLEX) 250 MG capsule Take 1 capsule (250 mg total) by mouth 2 (two) times daily. Patient not taking: Reported on 03/25/2017 03/16/17   Dorie Rank, MD  fluticasone [facility]) 50 MCG/ACT nasal spray Place 2 sprays into both nostrils daily. Patient not taking: Reported on 03/16/2017 06/24/15   Charlynne Cousins, MD  potassium chloride (K-DUR) 10 MEQ tablet Take 2 tablets (20 mEq total) by mouth daily. Patient not taking: Reported on 03/16/2017 06/24/15   Charlynne Cousins, MD  risperiDONE (RISPERDAL) 0.5 MG tablet Take 1-2 tablets (0.5-1 mg total) by mouth 2 (two) times daily. 1 tab in the morning and 2 tabs at bedtime Patient not taking: Reported on 03/16/2017 06/24/15   Charlynne Cousins, MD  risperiDONE (RISPERDAL) 1 MG tablet Take 1 tablet (1 mg total) by mouth at bedtime. Patient not taking: Reported on 03/16/2017 06/24/15   Charlynne Cousins, MD    Current Facility-Administered Medications  Medication Dose Route Frequency Provider Last Rate Last Dose  . acetaminophen (TYLENOL) tablet 650 mg  650 mg Oral Q6H PRN Toy Baker, MD       Or  . acetaminophen (TYLENOL) suppository 650 mg  650 mg Rectal Q6H  PRN Doutova, Anastassia, MD      . ARIPiprazole (ABILIFY) tablet 5 mg  5 mg Oral QHS Doutova, Anastassia, MD      . busPIRone (BUSPAR) tablet 7.5 mg  7.5 mg Oral BID Doutova, Anastassia, MD   7.5 mg at 04/30/17 1115  . gabapentin (NEURONTIN) capsule 100 mg  100 mg Oral QHS Doutova, Anastassia, MD      . hydrocortisone sodium succinate (SOLU-CORTEF) 100 MG injection 50 mg  50 mg Intravenous Q8H Doutova, Anastassia, MD   50 mg at 04/30/17 1423  . olopatadine (PATANOL) 0.1 % ophthalmic solution 1 drop  1 drop Both Eyes BID Toy Baker, MD   1 drop at 04/30/17 1115  . ondansetron (ZOFRAN) tablet 4 mg  4 mg Oral Q6H PRN Doutova, Anastassia, MD       Or  . ondansetron (ZOFRAN) injection 4 mg  4 mg Intravenous Q6H PRN Toy Baker, MD      .  pantoprazole (PROTONIX) 80 mg in sodium chloride 0.9 % 250 mL (0.32 mg/mL) infusion  8 mg/hr Intravenous Continuous Doutova, Anastassia, MD 25 mL/hr at 04/30/17 1425 8 mg/hr at 04/30/17 1425  . [START ON 05/03/2017] pantoprazole (PROTONIX) injection 40 mg  40 mg Intravenous Q12H Doutova, Anastassia, MD      . piperacillin-tazobactam (ZOSYN) IVPB 3.375 g  3.375 g Intravenous Q8H Doutova, Anastassia, MD 12.5 mL/hr at 04/30/17 1423 3.375 g at 04/30/17 1423  . sertraline (ZOLOFT) tablet 50 mg  50 mg Oral Daily Doutova, Anastassia, MD   50 mg at 04/30/17 1114  . traMADol (ULTRAM) tablet 50 mg  50 mg Oral Q8H PRN Toy Baker, MD       Current Outpatient Prescriptions  Medication Sig Dispense Refill  . acetaminophen (TYLENOL) 325 MG suppository Place 650 mg rectally every 4 (four) hours as needed (oral temp over 101).    Marland Kitchen acetaminophen (TYLENOL) 500 MG tablet Take 1,000 mg by mouth every 8 (eight) hours.    . ARIPiprazole (ABILIFY) 5 MG tablet Take 1 tablet (5 mg total) by mouth at bedtime. 10 tablet 0  . busPIRone (BUSPAR) 7.5 MG tablet Take 7.5 mg by mouth 2 (two) times daily.    . calcium carbonate (OS-CAL - DOSED IN MG OF ELEMENTAL CALCIUM) 1250  (500 Ca) MG tablet Take 1 tablet by mouth 2 (two) times daily with a meal.    . furosemide (LASIX) 40 MG tablet Take 1 tablet (40 mg total) by mouth daily. 30 tablet 0  . gabapentin (NEURONTIN) 100 MG capsule Take 1 capsule (100 mg total) by mouth at bedtime. 10 capsule 0  . Hypromellose (ISOPTO TEARS) 0.5 % SOLN Apply 1 drop to eye 3 (three) times daily. One drop in each eye, three times daily. (Patient taking differently: Place 1 drop into both eyes 3 (three) times daily. ) 1 Bottle 0  . magnesium oxide (MAG-OX) 400 MG tablet Take 400 mg by mouth daily.    . Melatonin 1 MG TABS Take 1 mg by mouth at bedtime.     Marland Kitchen olopatadine (PATANOL) 0.1 % ophthalmic solution Place 1 drop into both eyes 2 (two) times daily. 5 mL 12  . oxyCODONE (ROXICODONE) 5 MG immediate release tablet Take 0.5 tablets (2.5 mg total) by mouth every 6 (six) hours as needed for severe pain. (Patient taking differently: Take 2.5 mg by mouth every 4 (four) hours as needed for severe pain. ) 15 tablet 0  . pantoprazole (PROTONIX) 40 MG tablet Take 1 tablet (40 mg total) by mouth 2 (two) times daily before a meal. (Patient taking differently: Take 40 mg by mouth daily. ) 90 tablet 0  . polyethylene glycol (MIRALAX / GLYCOLAX) packet Take 17 g by mouth daily. 14 each 0  . potassium chloride SA (K-DUR,KLOR-CON) 20 MEQ tablet Take 60 mEq by mouth daily.    . predniSONE (DELTASONE) 5 MG tablet Take 1 tablet (5 mg total) by mouth daily with breakfast. (Patient taking differently: Take 10 mg by mouth daily with breakfast. ) 10 tablet 0  . senna (SENOKOT) 8.6 MG TABS tablet Take 1 tablet (8.6 mg total) by mouth at bedtime. 120 each 0  . senna-docusate (SENOKOT-S) 8.6-50 MG tablet Take 1 tablet by mouth 2 (two) times daily as needed for mild constipation.    . sertraline (ZOLOFT) 50 MG tablet Take 1 tablet (50 mg total) by mouth daily. 10 tablet 0  . traMADol (ULTRAM) 50 MG tablet Take 50 mg by mouth  every 8 (eight) hours as needed (for pain).     . cephALEXin (KEFLEX) 250 MG capsule Take 1 capsule (250 mg total) by mouth 2 (two) times daily. (Patient not taking: Reported on 03/25/2017) 14 capsule 0  . fluticasone (FLONASE) 50 MCG/ACT nasal spray Place 2 sprays into both nostrils daily. (Patient not taking: Reported on 03/16/2017) 1 g 2  . potassium chloride (K-DUR) 10 MEQ tablet Take 2 tablets (20 mEq total) by mouth daily. (Patient not taking: Reported on 03/16/2017) 30 tablet 0  . risperiDONE (RISPERDAL) 0.5 MG tablet Take 1-2 tablets (0.5-1 mg total) by mouth 2 (two) times daily. 1 tab in the morning and 2 tabs at bedtime (Patient not taking: Reported on 03/16/2017) 10 tablet 0  . risperiDONE (RISPERDAL) 1 MG tablet Take 1 tablet (1 mg total) by mouth at bedtime. (Patient not taking: Reported on 03/16/2017) 30 tablet 3    Allergies as of 04/29/2017 - Review Complete 04/29/2017  Allergen Reaction Noted  . Pollen extract Other (See Comments) 07/24/2014    Family History  Problem Relation Age of Onset  . Hypertension Mother   . Hyperlipidemia Mother   . Heart disease Father        6s  . Cancer Brother     Social History   Social History  . Marital status: Divorced    Spouse name: N/A  . Number of children: N/A  . Years of education: N/A   Occupational History  . Not on file.   Social History Main Topics  . Smoking status: Former Smoker    Packs/day: 1.00    Types: Cigarettes    Quit date: 09/02/1971  . Smokeless tobacco: Never Used  . Alcohol use Yes     Comment: occasionally  . Drug use: No  . Sexual activity: Not Currently   Other Topics Concern  . Not on file   Social History Narrative   Lives in a townhouse with steps.  Lives alone.  Ambulates without assist device.  Drives, pays own bills, and completes ADLs without assistance.  Pt. Current/past profession-Concierge. Pt. Does exercise (walk) 1 X Day. Pt. Does have a living will. Pt. Do have a DNR form. And also Pt. Have signed POA/HPOA forms.      Neighbor,  [name], is her emergency contact:  [phone]   Full code                   Review of Systems:  GI: Described in detail in HPI.    Gen: Denies any fever, chills, rigors, night sweats, anorexia, fatigue, weakness, malaise, involuntary weight loss, and sleep disorder CV: Denies chest pain, angina, palpitations, syncope, orthopnea, PND, peripheral edema, and claudication. Resp: Denies dyspnea, cough, sputum, wheezing, coughing up blood. GU : Denies urinary burning, blood in urine, urinary frequency, urinary hesitancy, nocturnal urination, and urinary incontinence. MS: Denies joint pain or swelling.  Denies muscle weakness, cramps, atrophy.  Derm: Denies rash, itching, oral ulcerations, hives, unhealing ulcers.  Psych: Denies depression, anxiety, Jamie loss, suicidal ideation, hallucinations,  and confusion. Heme: Denies bruising, bleeding, and enlarged lymph nodes. Neuro:  Denies any headaches, dizziness, paresthesias. Endo:  Denies any problems with DM, thyroid, adrenal function.  Physical Exam: Vital signs in last 24 hours: Temp:  [98.4 F (36.9 C)-98.7 F (37.1 C)] 98.4 F (36.9 C) (08/29 2133) Pulse Rate:  [64-85] 84 (08/30 0630) Resp:  [12-26] 21 (08/30 1130) BP: (103-139)/(56-84) 139/57 (08/30 1130) SpO2:  [91 %-98 %] 98 % (08/30 0630)  General:   Alert,  Well-developed, well-nourished, pleasant and cooperative in NAD Head:  Normocephalic and atraumatic. Eyes:  Sclera clear, no icterus.   Conjunctiva pink. Ears:  Normal auditory acuity. Nose:  No deformity, discharge,  or lesions. Mouth:  No deformity or lesions.  Oropharynx pink & moist. Neck:  Supple; no masses or thyromegaly. Lungs:  Clear throughout to auscultation.   No wheezes, crackles, or rhonchi. No acute distress. Heart:  Regular rate and rhythm; no murmurs, clicks, rubs,  or gallops. Extremities:  Without clubbing or edema. Neurologic:  Alert and  oriented x4;  grossly normal neurologically. Skin:  Intact  without significant lesions or rashes. Psych:  Alert and cooperative. Normal mood and affect. Abdomen:  Soft, nontender and nondistended. No masses, hepatosplenomegaly or hernias noted. Normal bowel sounds, without guarding, and without rebound. Rectal exam:   Impacted stool, large amount, disimpacted manually. Stool was hard, [place] in color, with maroon-colored coating around it.      Lab Results:  Recent Labs  04/29/17 1933 04/29/17 2350 04/30/17 0548  WBC 9.8 9.9 9.0  HGB 13.2 12.1 11.6*  HCT 41.1 37.0 36.3  PLT 320 286 277   BMET  Recent Labs  04/29/17 1933 04/30/17 0600  NA 141 139  K 3.9 3.8  CL 104 107  CO2 29 24  GLUCOSE 91 109*  BUN 19 16  CREATININE 1.09* 0.94  CALCIUM 9.6 8.8*   LFT  Recent Labs  04/30/17 0600  PROT 5.3*  ALBUMIN 2.9*  AST 126*  ALT 273*  ALKPHOS 482*  BILITOT 0.9   PT/INR No results for input(s): LABPROT, INR in the last 72 hours.  Studies/Results: Ct Abdomen Pelvis W Contrast  Result Date: 04/29/2017 CLINICAL DATA:  Bright red blood in stool. Abdominal pain and distension. Gastric ulcer, dementia. Assess for colitis. EXAM: CT ABDOMEN AND PELVIS WITH CONTRAST TECHNIQUE: Multidetector CT imaging of the abdomen and pelvis was performed using the standard protocol following bolus administration of intravenous contrast. CONTRAST:  173mL ISOVUE-300 IOPAMIDOL (ISOVUE-300) INJECTION 61% COMPARISON:  CT abdomen and pelvis June 22, 2015 FINDINGS: LOWER CHEST: Dependent atelectasis and scarring. Heart size is normal. Annular calcifications. No pericardial effusion. HEPATOBILIARY: Stable 16 mm cyst in dome of the liver. Mild new intrahepatic biliary dilatation. Distended gallbladder with mild wall thickening. No CT findings of cholelithiasis. No extrahepatic biliary dilatation. PANCREAS: Normal. SPLEEN: Normal. ADRENALS/URINARY TRACT: Kidneys are orthotopic, demonstrating symmetric enhancement. 5 x 8 mm RIGHT and 6 x 10 mm LEFT renal pelvic  calculi without hydronephrosis. Mild cortical atrophy bilaterally, RIGHT cortical scarring. Hydronephrosis or solid renal masses. The unopacified ureters are normal in course and caliber. Delayed imaging through the kidneys demonstrates symmetric prompt contrast excretion within the proximal urinary collecting system. Urinary bladder is well distended and unremarkable. Normal adrenal glands. STOMACH/BOWEL: Large hiatal hernia containing the majority of the stomach, superior aspect out of field-of-view. Stool distended rectum at 7.3 cm. Moderate descending and sigmoid colonic diverticulosis. Moderate amount of retained large bowel stool. The appendix is not discretely identified, however there are no inflammatory changes in the right lower quadrant. VASCULAR/LYMPHATIC: Aortoiliac vessels are normal in course and caliber. Moderate to severe calcific atherosclerosis carotid bifurcations. No lymphadenopathy by CT size criteria. REPRODUCTIVE: Normal. OTHER: No intraperitoneal free fluid or free air. MUSCULOSKELETAL: Nonacute. Small fat containing umbilical hernia. Comminuted healing LEFT inferior and superior pubic rami fractures. Healing bilateral sacroiliac fractures. Minimal grade 1 L4-5 anterolisthesis on degenerative basis. IMPRESSION: 1. Diverticulosis without acute diverticulitis. Moderate retained large  bowel stool with stool distended rectum equivocal for fecal impaction. No bowel obstruction. 2. New mild intrahepatic biliary dilatation with distended gallbladder, possible acute or chronic cholecystitis. Recommend ultrasound. 3. Bilateral nonobstructing nephrolithiasis. 4. Large hiatal hernia. 5. Multiple healing pelvic fractures. Aortic Atherosclerosis (ICD10-I70.0). Electronically Signed   By: Elon Alas M.D.   On: 04/29/2017 22:52   US Abdomen Limited Ruq  Result Date: 04/30/2017 CLINICAL DATA:  RIGHT upper quadrant pain. Possible cholecystitis on today's CT abdomen and pelvis. EXAM: ULTRASOUND  ABDOMEN LIMITED RIGHT UPPER QUADRANT COMPARISON:  CT abdomen and pelvis April 29, 2017 FINDINGS: Habitus limited examination, patient was unable to cooperate with imaging directions. Gallbladder: Mild gallbladder distension. Multiple echogenic sludge ball measuring to 16 mm with acoustic enhancement. No pericholecystic fluid. Gallbladder wall thickening at 4 mm. No sonographic Murphy's sign elicited. Common bile duct: Diameter: 12 mm proximally, 6 mm distally.  No choledocholithiasis. Liver: No focal lesion identified. Within normal limits in parenchymal echogenicity. Portal vein is patent on color Doppler imaging with normal direction of blood flow towards the liver. IMPRESSION: 1. Gallbladder sludge and wall thickening without sonographic Murphy sign. Findings suggest chronic cholecystitis. Electronically Signed   By: Elon Alas M.D.   On: 04/30/2017 00:34    Impression: 1. Bright red blood/maroon stools 2. Fecal impaction 3. History of gastric ulcers, duodenal AVM status post APC, large hiatal hernia  Plan: After manual disimpaction, it was noted that stools were Lage in color with maroon/bright red blood coating around the stool. This could be related to stercoral ulcers from chronic constipation/fecal impaction. CAT scan also showed diverticulosis, and this could be related to diverticular bleed. This is less likely as hemoglobin is 11.6 and patient remains hemodynamically stable.  Recommend bleeding scan to determine if there is any active bleeding in the colon. Recommend clear liquid diet. Upper GI bleeding less likely, normal BUN/creatinine ratio, hemodynamically stable.  2. Chronic cholecystitis   LOS: 0 days   Ronnette Juniper, M.D. 04/30/2017, 3:04 PM  Pager [phone] If no answer or after 5 PM call [phone]

## 2017-04-30 NOTE — ED Notes (Signed)
Changed pt's linens and brief.  Blood found in brief.  MD aware

## 2017-04-30 NOTE — Progress Notes (Signed)
CSW attempted to complete assessment but pt was not oriented X 4 and has a HX of dementia.  CSW attempted to call the pt's legal guardian who is DSS worker [name] at [phone] and was unable to reach her.  Please reconsult if future social work needs arise.  CSW signing off, as social work intervention is no longer needed.  Jamie [name]. Martine Bleecker, LCSWA, Deon Pilling, CSI Clinical Social Worker Ph: [phone]

## 2017-04-30 NOTE — H&P (Signed)
Jamie [name] EXH:[id number] DOB: [birth year] DOA: 04/29/2017     PCP: Kennyth Arnold, FNP   Outpatient Specialists: none   Patient coming from   From facility SNF memory unit [facility]  Chief Complaint: Bright red blood per rectum  HPI: Jamie [name] is a 83 y.o. female with medical history significant of dementia remote alcohol abuse, and excited chronic pain depression and history of gastric ulcers GI bleed, hyperlipidemia hypertension, aortic stenosis    Presented with bright red/maroon blood per rectum noted by nursing home staff today patient herself unable to provide any history. Not on blood thinners. No nausea vomiting patient herself has significant dementia and unable to provide any history  She has been a warden of the state. Note that the have elevated LFTs CT scan worrisome for cholecystitis   ER provided discussed case with social work in charge of her case who stated that admission and blood transfusions will find that they need to have a supervisor to comment regarding needing any surgical procedures.  Of note patient has prednisone on her medical records dating back from August 2016 and she was supposed to be discharged on prednisone taper for possible bronchiolitis it is unclear if patient has been taking it for past 2 years but currently this listed on her MAR. Unclear why patient is on chronic doses of prednisone   Regarding pertinent Chronic problems: in 2016 patient had GI bleed under gone EGD by Dr. Michail Sermon which showed  Minimal scattered nonbleeding erosions in the stomach body. Large hiatal hernia noted without any active bleeding or ulcers seen. Duodenal bulb normal. Small amount of active bleeding seen in the 2nd part of the duodenum from an AVM. S/P ablation of AVM with argon plasma coagulation  Last colonoscopy was in 2012 showed cecal lipoma but otherwise no source of bleeding this was also done for GI bleed at the same time EGD did show  gastric ulcer at that point it was felt to be more likely cause  IN ER:  Temp (24hrs), Avg:98.6 F (37 C), Min:98.4 F (36.9 C), Max:98.7 F (37.1 C)      on arrival  ED Triage Vitals [04/29/17 1802]  Enc Vitals Group     BP 129/77     Pulse Rate 79     Resp 18     Temp 98.7 F (37.1 C)     Temp Source Oral     SpO2 96 %     Weight      Height      Head Circumference      Peak Flow      Pain Score      Pain Loc      Pain Edu?      Excl. in [place]?     Latest 19 HR 72 BP 103/56 WBC 9.9 Hg 12.1 PLT 286 Lactic acid 0.92 Hemoccult positive Na 141 K 3.9 cr 1.09 WBC 9.8 Hg 13.2 Lipase 36  Ultrasound of abdomen suggestive of chronic cholecystitis CT of abdomen showing diverticulosis no diverticulitis fecal impaction large hiatal hernia nonobstructive nephrolithiasis  Following Medications were ordered in ER: Medications  iopamidol (ISOVUE-300) 61 % injection (not administered)  pantoprazole (PROTONIX) 80 mg in sodium chloride 0.9 % 100 mL IVPB (not administered)  iopamidol (ISOVUE-300) 61 % injection 100 mL (100 mLs Intravenous Contrast Given 04/29/17 2224)  piperacillin-tazobactam (ZOSYN) IVPB 3.375 g (0 g Intravenous Stopped 04/29/17 2351)  0.9 %  sodium chloride infusion (  Intravenous New Bag/Given 04/29/17 2351)     ER provider discussed case with:  General Surgery Dr. Marlinda Mike  Who recommends: Admission to medicine for conservative management of cholecystitis and management of GI bleed We'll see patient in consult in the morning   Hospitalist was called for admission for lower GI bleed associated with fecal impaction and cholecystitis suspect chronic  Review of Systems:    Pertinent positives include: Unable to obtain secondary severe dementia   Past Medical History: Past Medical History:  Diagnosis Date  . Anxiety   . Chronic pain   . Depression   . Gastric ulcer 05/2011  . Hyperlipidemia   . Hypertension   . Obesity    5'3"   Past Surgical History:    Procedure Laterality Date  . ABDOMINAL HYSTERECTOMY     PARTIAL  . BILATERAL OOPHORECTOMY    . ESOPHAGOGASTRODUODENOSCOPY  08/12/2012   Procedure: ESOPHAGOGASTRODUODENOSCOPY (EGD);  Surgeon: Arta Silence, MD;  Location: [facility];  Service: Endoscopy;  Laterality: Left;  . ESOPHAGOGASTRODUODENOSCOPY (EGD) WITH PROPOFOL N/A 12/28/2014   Procedure: ESOPHAGOGASTRODUODENOSCOPY (EGD) WITH PROPOFOL;  Surgeon: Wilford Corner, MD;  Location: [facility];  Service: Endoscopy;  Laterality: N/A;  . skin biopsy     nose lesion, noncancerous     Social History:  Ambulatory  walker    reports that she quit smoking about 45 years ago. Her smoking use included Cigarettes. She smoked 1.00 pack per day. She has never used smokeless tobacco. She reports that she drinks alcohol. She reports that she does not use drugs.  Allergies:   Allergies  Allergen Reactions  . Pollen Extract Other (See Comments)    Swollen Eyes & Runny Nose.       Family History:   Family History  Problem Relation Age of Onset  . Hypertension Mother   . Hyperlipidemia Mother   . Heart disease Father        49s  . Cancer Brother     Medications: Prior to Admission medications   Medication Sig Start Date End Date Taking? Authorizing Provider  acetaminophen (TYLENOL) 325 MG suppository Place 650 mg rectally every 4 (four) hours as needed (oral temp over 101).   Yes [provider]  acetaminophen (TYLENOL) 500 MG tablet Take 1,000 mg by mouth every 8 (eight) hours.   Yes [provider]  ARIPiprazole (ABILIFY) 5 MG tablet Take 1 tablet (5 mg total) by mouth at bedtime. 06/24/15  Yes Charlynne Cousins, MD  busPIRone (BUSPAR) 7.5 MG tablet Take 7.5 mg by mouth 2 (two) times daily.   Yes [provider]  calcium carbonate (OS-CAL - DOSED IN MG OF ELEMENTAL CALCIUM) 1250 (500 Ca) MG tablet Take 1 tablet by mouth 2 (two) times daily with a meal.   Yes [provider]  furosemide  (LASIX) 40 MG tablet Take 1 tablet (40 mg total) by mouth daily. 06/24/15  Yes Charlynne Cousins, MD  gabapentin (NEURONTIN) 100 MG capsule Take 1 capsule (100 mg total) by mouth at bedtime. 06/24/15  Yes Charlynne Cousins, MD  Hypromellose (ISOPTO TEARS) 0.5 % SOLN Apply 1 drop to eye 3 (three) times daily. One drop in each eye, three times daily. Patient taking differently: Place 1 drop into both eyes 3 (three) times daily.  06/24/15  Yes Charlynne Cousins, MD  magnesium oxide (MAG-OX) 400 MG tablet Take 400 mg by mouth daily.   Yes [provider]  Melatonin 1 MG TABS Take 1 mg by mouth  at bedtime.    Yes [provider]  olopatadine (PATANOL) 0.1 % ophthalmic solution Place 1 drop into both eyes 2 (two) times daily. 06/24/15  Yes Charlynne Cousins, MD  oxyCODONE (ROXICODONE) 5 MG immediate release tablet Take 0.5 tablets (2.5 mg total) by mouth every 6 (six) hours as needed for severe pain. Patient taking differently: Take 2.5 mg by mouth every 4 (four) hours as needed for severe pain.  03/27/17  Yes Khatri, Hina, PA-C  pantoprazole (PROTONIX) 40 MG tablet Take 1 tablet (40 mg total) by mouth 2 (two) times daily before a meal. Patient taking differently: Take 40 mg by mouth daily.  06/24/15  Yes Charlynne Cousins, MD  polyethylene glycol [facility] Floria Raveling) packet Take 17 g by mouth daily. 06/24/15  Yes Charlynne Cousins, MD  potassium chloride SA (K-DUR,KLOR-CON) 20 MEQ tablet Take 60 mEq by mouth daily.   Yes [provider]  predniSONE (DELTASONE) 5 MG tablet Take 1 tablet (5 mg total) by mouth daily with breakfast. Patient taking differently: Take 10 mg by mouth daily with breakfast.  06/24/15  Yes Charlynne Cousins, MD  senna (SENOKOT) 8.6 MG TABS tablet Take 1 tablet (8.6 mg total) by mouth at bedtime. 06/24/15  Yes Charlynne Cousins, MD  senna-docusate (SENOKOT-S) 8.6-50 MG tablet Take 1 tablet by mouth 2 (two) times daily as needed for  mild constipation.   Yes [provider]  sertraline (ZOLOFT) 50 MG tablet Take 1 tablet (50 mg total) by mouth daily. 06/24/15  Yes Charlynne Cousins, MD  traMADol (ULTRAM) 50 MG tablet Take 50 mg by mouth every 8 (eight) hours as needed (for pain).   Yes [provider]  cephALEXin (KEFLEX) 250 MG capsule Take 1 capsule (250 mg total) by mouth 2 (two) times daily. Patient not taking: Reported on 03/25/2017 03/16/17   Dorie Rank, MD  fluticasone [facility]) 50 MCG/ACT nasal spray Place 2 sprays into both nostrils daily. Patient not taking: Reported on 03/16/2017 06/24/15   Charlynne Cousins, MD  potassium chloride (K-DUR) 10 MEQ tablet Take 2 tablets (20 mEq total) by mouth daily. Patient not taking: Reported on 03/16/2017 06/24/15   Charlynne Cousins, MD  risperiDONE (RISPERDAL) 0.5 MG tablet Take 1-2 tablets (0.5-1 mg total) by mouth 2 (two) times daily. 1 tab in the morning and 2 tabs at bedtime Patient not taking: Reported on 03/16/2017 06/24/15   Charlynne Cousins, MD  risperiDONE (RISPERDAL) 1 MG tablet Take 1 tablet (1 mg total) by mouth at bedtime. Patient not taking: Reported on 03/16/2017 06/24/15   Charlynne Cousins, MD    Physical Exam: Patient Vitals for the past 24 hrs:  BP Temp Temp src Pulse Resp SpO2  04/29/17 2300 (!) 103/56 - - - 19 -  04/29/17 2248 126/69 - - 72 (!) 23 92 %  04/29/17 2210 108/76 - - 77 (!) 24 94 %  04/29/17 2133 114/72 98.4 F (36.9 C) Oral 83 (!) 22 95 %  04/29/17 1802 129/77 98.7 F (37.1 C) Oral 79 18 96 %    1. General:  in No Acute distress   Chronically ill -appearing 2. Psychological: Alert but not Oriented 3. Head/ENT:     Dry Mucous Membranes                          Head Non traumatic, neck supple  Poor Dentition 4. SKIN:  decreased Skin turgor,  Skin clean Dry and intact no rash 5. Heart: Regular rate and rhythm systolic Murmur, no Rub or gallop 6. Lungs:  no wheezes or crackles   7.  Abdomen: Soft, non-tender, Non distended  bowel sounds present 8. Lower extremities: no clubbing, cyanosis, or edema 9. Neurologically Grossly intact, moving all 4 extremities equally   10. MSK: Normal range of motion   body mass index is unknown because there is no height or weight on file.  Labs on Admission:   Labs on Admission: I have personally reviewed following labs and imaging studies  CBC:  Recent Labs Lab 04/29/17 1933 04/29/17 2350  WBC 9.8 9.9  HGB 13.2 12.1  HCT 41.1 37.0  MCV 91.1 89.2  PLT 320 209   Basic Metabolic Panel:  Recent Labs Lab 04/29/17 1933  NA 141  K 3.9  CL 104  CO2 29  GLUCOSE 91  BUN 19  CREATININE 1.09*  CALCIUM 9.6   GFR: CrCl cannot be calculated (Unknown ideal weight.). Liver Function Tests:  Recent Labs Lab 04/29/17 1933  AST 249*  ALT 390*  ALKPHOS 602*  BILITOT 0.7  PROT 6.6  ALBUMIN 3.8    Recent Labs Lab 04/29/17 1933  LIPASE 36   No results for input(s): AMMONIA in the last 168 hours. Coagulation Profile: No results for input(s): INR, PROTIME in the last 168 hours. Cardiac Enzymes: No results for input(s): CKTOTAL, CKMB, CKMBINDEX, TROPONINI in the last 168 hours. BNP (last 3 results) No results for input(s): PROBNP in the last 8760 hours. HbA1C: No results for input(s): HGBA1C in the last 72 hours. CBG: No results for input(s): GLUCAP in the last 168 hours. Lipid Profile: No results for input(s): CHOL, HDL, LDLCALC, TRIG, CHOLHDL, LDLDIRECT in the last 72 hours. Thyroid Function Tests: No results for input(s): TSH, T4TOTAL, FREET4, T3FREE, THYROIDAB in the last 72 hours. Anemia Panel: No results for input(s): VITAMINB12, FOLATE, FERRITIN, TIBC, IRON, RETICCTPCT in the last 72 hours. Urine analysis:  Sepsis Labs: @LABRCNTIP (procalcitonin:4,lacticidven:4) )No results found for this or any previous visit (from the past 240 hour(s)).    UA   not ordered  No results found for: HGBA1C  CrCl cannot  be calculated (Unknown ideal weight.).  BNP (last 3 results) No results for input(s): PROBNP in the last 8760 hours.   ECG REPORT Not ordered  There were no vitals filed for this visit.   Cultures:    Component Value Date/Time   SDES URINE, CLEAN CATCH 03/16/2017 1157   SPECREQUEST NONE 03/16/2017 1157   CULT  03/16/2017 1157    NO GROWTH Performed at [facility], [place] [address], [place], [place] [address]    REPTSTATUS 03/17/2017 FINAL 03/16/2017 1157     Radiological Exams on Admission: Ct Abdomen Pelvis W Contrast  Result Date: 04/29/2017 CLINICAL DATA:  Bright red blood in stool. Abdominal pain and distension. Gastric ulcer, dementia. Assess for colitis. EXAM: CT ABDOMEN AND PELVIS WITH CONTRAST TECHNIQUE: Multidetector CT imaging of the abdomen and pelvis was performed using the standard protocol following bolus administration of intravenous contrast. CONTRAST:  189mL ISOVUE-300 IOPAMIDOL (ISOVUE-300) INJECTION 61% COMPARISON:  CT abdomen and pelvis June 22, 2015 FINDINGS: LOWER CHEST: Dependent atelectasis and scarring. Heart size is normal. Annular calcifications. No pericardial effusion. HEPATOBILIARY: Stable 16 mm cyst in dome of the liver. Mild new intrahepatic biliary dilatation. Distended gallbladder with mild wall thickening. No CT findings of cholelithiasis. No extrahepatic biliary  dilatation. PANCREAS: Normal. SPLEEN: Normal. ADRENALS/URINARY TRACT: Kidneys are orthotopic, demonstrating symmetric enhancement. 5 x 8 mm RIGHT and 6 x 10 mm LEFT renal pelvic calculi without hydronephrosis. Mild cortical atrophy bilaterally, RIGHT cortical scarring. Hydronephrosis or solid renal masses. The unopacified ureters are normal in course and caliber. Delayed imaging through the kidneys demonstrates symmetric prompt contrast excretion within the proximal urinary collecting system. Urinary bladder is well distended and unremarkable. Normal adrenal glands. STOMACH/BOWEL: Large  hiatal hernia containing the majority of the stomach, superior aspect out of field-of-view. Stool distended rectum at 7.3 cm. Moderate descending and sigmoid colonic diverticulosis. Moderate amount of retained large bowel stool. The appendix is not discretely identified, however there are no inflammatory changes in the right lower quadrant. VASCULAR/LYMPHATIC: Aortoiliac vessels are normal in course and caliber. Moderate to severe calcific atherosclerosis carotid bifurcations. No lymphadenopathy by CT size criteria. REPRODUCTIVE: Normal. OTHER: No intraperitoneal free fluid or free air. MUSCULOSKELETAL: Nonacute. Small fat containing umbilical hernia. Comminuted healing LEFT inferior and superior pubic rami fractures. Healing bilateral sacroiliac fractures. Minimal grade 1 L4-5 anterolisthesis on degenerative basis. IMPRESSION: 1. Diverticulosis without acute diverticulitis. Moderate retained large bowel stool with stool distended rectum equivocal for fecal impaction. No bowel obstruction. 2. New mild intrahepatic biliary dilatation with distended gallbladder, possible acute or chronic cholecystitis. Recommend ultrasound. 3. Bilateral nonobstructing nephrolithiasis. 4. Large hiatal hernia. 5. Multiple healing pelvic fractures. Aortic Atherosclerosis (ICD10-I70.0). Electronically Signed   By: Elon Alas M.D.   On: 04/29/2017 22:52    Chart has been reviewed    Assessment/Plan  83 y.o. female with medical history significant of dementia remote alcohol abuse, and excited chronic pain depression and history of gastric ulcers GI bleed, hyperlipidemia hypertension, aortic stenosis Admitted for lower GI bleed associated with fecal impaction and cholecystitis suspect chronic  Present on Admission:  Cholecystitis question chronic also may not develop appropriate reaction being on long-term steroids. Appreciate general surgery consult. Continue Zosyn and keep nothing by mouth for now. Appreciate palliative  care consult to help determine goals of care . Acute blood loss anemia -  continue serial CBC, will need GI consult in the morning transfuse as needed for hemoglobin below 7 or evidence of  profuse bleeding . Dementia with behavioral disturbance - expect some degree of sundowning while hospitalized . Essential hypertension - permissive hypertension overnight . GI bleed - most likely lower GI source given concomitant fecal impaction would benefit from GI consult to help with management hold off on enema tonight to await exacerbation of GI bleed. Given significant fecal impaction doubt upper source . Hyperlipemia stable continue home medications . Obstipation - will need to discuss with GI in a.m. regarding father management may be contributing to bleeding Hypotension - patient has been on chronic doses of prednisone possibly for past 2 years unclear reason,  possibly respiratory. Given soft blood pressures will give stress dose steroids for now. If there is no clear indication for long-term steroid use will need to attempt to wean as an outpatient  Other plan as per orders.  DVT prophylaxis:  SCD     Code Status:    DNR/DNI as per paperwork   Family Communication:   Family not  at  Bedside  Disposition Plan:                             Back to current facility when stable  Social Work  Palliative care   consulted                          Consults called: General Surgery   Admission status:    inpatient       Level of care     tele          I have spent a total of 56 min on this admission   Deserie Dirks 04/30/2017, 1:43 AM    [facility]  Pager [phone]   after 2 AM please page floor coverage PA If 7AM-7PM, please contact the day team taking care of the patient  [email or web address]  Password [email or web address]

## 2017-04-30 NOTE — Progress Notes (Signed)
To [facility] med for GIB scan

## 2017-04-30 NOTE — ED Notes (Signed)
Report time 1530. [phone]

## 2017-05-01 LAB — BASIC METABOLIC PANEL
Anion gap: 10 (ref 5–15)
BUN: 20 mg/dL (ref 6–20)
CO2: 24 mmol/L (ref 22–32)
Calcium: 9.1 mg/dL (ref 8.9–10.3)
Chloride: 111 mmol/L (ref 101–111)
Creatinine, Ser: 0.9 mg/dL (ref 0.44–1.00)
GFR calc Af Amer: >60 mL/min (ref >60)
GFR, EST NON AFRICAN AMERICAN: 57 mL/min — AB (ref >60)
Glucose, Bld: 109 mg/dL — ABNORMAL HIGH (ref 65–99)
Potassium: 3.2 mmol/L — ABNORMAL LOW (ref 3.5–5.1)
Sodium: 145 mmol/L (ref 135–145)

## 2017-05-01 LAB — CBC
HCT: 35 % — ABNORMAL LOW (ref 36.0–46.0)
Hemoglobin: 11.5 g/dL — ABNORMAL LOW (ref 12.0–15.0)
MCH: 29.3 pg (ref 26.0–34.0)
MCHC: 32.9 g/dL (ref 30.0–36.0)
MCV: 89.3 fL (ref 78.0–100.0)
PLATELETS: 281 10*3/uL (ref 150–400)
RBC: 3.92 MIL/uL (ref 3.87–5.11)
RDW: 15.9 % — AB (ref 11.5–15.5)
WBC: 9.3 10*3/uL (ref 4.0–10.5)

## 2017-05-01 MED ORDER — POTASSIUM CHLORIDE 20 MEQ/15ML (10%) PO SOLN
40.0000 meq | Freq: Once | ORAL | Status: AC
  Administered 2017-05-01: 40 meq via ORAL
  Filled 2017-05-01: qty 30

## 2017-05-01 MED ORDER — PREDNISONE 5 MG PO TABS
10.0000 mg | ORAL_TABLET | Freq: Every day | ORAL | Status: DC
  Administered 2017-05-01 – 2017-05-02 (×2): 10 mg via ORAL
  Filled 2017-05-01 (×2): qty 2

## 2017-05-01 MED ORDER — POTASSIUM CHLORIDE 10 MEQ/100ML IV SOLN
10.0000 meq | INTRAVENOUS | Status: DC
  Administered 2017-05-01: 10 meq via INTRAVENOUS
  Filled 2017-05-01 (×3): qty 100

## 2017-05-01 MED ORDER — POLYETHYLENE GLYCOL 3350 17 G PO PACK
17.0000 g | PACK | Freq: Two times a day (BID) | ORAL | Status: DC
  Administered 2017-05-01 – 2017-05-02 (×3): 17 g via ORAL
  Filled 2017-05-01 (×3): qty 1

## 2017-05-01 MED ORDER — TRAMADOL HCL 50 MG PO TABS: 50.0000 mg | ORAL_TABLET | Freq: Three times a day (TID) | ORAL | 0 refills | Status: DC | PRN

## 2017-05-01 MED ORDER — PANTOPRAZOLE SODIUM 40 MG PO TBEC
40.0000 mg | DELAYED_RELEASE_TABLET | Freq: Every day | ORAL | Status: DC
  Administered 2017-05-01 – 2017-05-02 (×2): 40 mg via ORAL
  Filled 2017-05-01 (×2): qty 1

## 2017-05-01 NOTE — Progress Notes (Addendum)
Patient ID: Jamie [name], female   DOB: May 13, 1930, 83 y.o.   MRN: [id number]  PROGRESS NOTE    LAVERE SHINSKY  ACZ:[id number] DOB: [birth year] DOA: 04/29/2017  PCP: Kennyth Arnold, FNP   Brief Narrative:  83 year old female with history of dementia, remote alcohol abuse, depression, history of gastric ulcers, dyslipidemia who presented from skilled nursing facility with bright red blood, maroon blood per rectum. Patient was hemodynamically stable on discharge. GI has seen the patient in consultation. Nuclear scan did not reveal acute bleed. LFTs were elevated and concerning for acute cholecystitis for which reason patient was placed on Zosyn.   Assessment & Plan:   Active Problems: Acute blood loss anemia / acute lower GI bleed - No clear scan with no evidence of bleeding - We will advance the diet to regular - Continue Protonix drip - Follow up GI recommendation  Possible acute cholecystitis / abnormal LFTs - Patient is on empiric Zosyn - Will consult surgery - LFTs trending down  Hypokalemia - Due to GI related issues - Supplemented  Dementia with behavioral disturbance - Continue Abilify, BuSpar  Depression - Continue Zoloft  Obstipation - Manually disimpacted  DVT prophylaxis: SCD Code Status: DNR/DNI Family Communication: no family at bedside Disposition Plan: to skilled nursing facility once cleared by GI   Consultants:   GI  Surgery   Procedures:   None  Antimicrobials:   Zosyn 04/30/2017 -->   Subjective: No overnight events.  Objective: Vitals:   04/30/17 1600 04/30/17 1614 04/30/17 2001 05/01/17 0444  BP: 111/73 (!) 119/59 117/62 133/79  Pulse: 82 84 65 75  Resp: (!) 27 20 16 18   Temp:  98.4 F (36.9 C) 98.3 F (36.8 C) 98 F (36.7 C)  TempSrc:  Oral Oral Oral  SpO2: 98% 97% 96% 95%  Weight:  64.4 kg (141 lb 15.6 oz)    Height:  5\' 2"  (1.575 m)      Intake/Output Summary (Last 24 hours) at 05/01/17 0810 Last data filed at 05/01/17  0200  Gross per 24 hour  Intake           792.09 ml  Output                0 ml  Net           792.09 ml   Filed Weights   04/30/17 1614  Weight: 64.4 kg (141 lb 15.6 oz)    Examination:  General exam: Appears calm and comfortable  Respiratory system: Clear to auscultation. Respiratory effort normal. Cardiovascular system: S1 & S2 heard, RRR. Gastrointestinal system: Abdomen is nondistended, soft and nontender. No organomegaly or masses felt. Normal bowel sounds heard. Central nervous system: No focal neurological deficits. Extremities: Symmetric 5 x 5 power. Skin: No rashes, lesions or ulcers Psychiatry: Judgement and insight appear normal. Mood & affect appropriate.   Data Reviewed: I have personally reviewed following labs and imaging studies  CBC:  Recent Labs Lab 04/29/17 1933 04/29/17 2350 04/30/17 0548 05/01/17 0455  WBC 9.8 9.9 9.0 9.3  HGB 13.2 12.1 11.6* 11.5*  HCT 41.1 37.0 36.3 35.0*  MCV 91.1 89.2 90.8 89.3  PLT 320 286 277 109   Basic Metabolic Panel:  Recent Labs Lab 04/29/17 1933 04/30/17 0600 05/01/17 0455  NA 141 139 145  K 3.9 3.8 3.2*  CL 104 107 111  CO2 29 24 24   GLUCOSE 91 109* 109*  BUN 19 16 20   CREATININE 1.09* 0.94 0.90  CALCIUM 9.6  8.8* 9.1  MG  --  1.9  --   PHOS  --  3.0  --    GFR: Estimated Creatinine Clearance: 40.3 mL/min (by C-G formula based on SCr of 0.9 mg/dL). Liver Function Tests:  Recent Labs Lab 04/29/17 1933 04/30/17 0600  AST 249* 126*  ALT 390* 273*  ALKPHOS 602* 482*  BILITOT 0.7 0.9  PROT 6.6 5.3*  ALBUMIN 3.8 2.9*    Recent Labs Lab 04/29/17 1933  LIPASE 36   No results for input(s): AMMONIA in the last 168 hours. Coagulation Profile: No results for input(s): INR, PROTIME in the last 168 hours. Cardiac Enzymes: No results for input(s): CKTOTAL, CKMB, CKMBINDEX, TROPONINI in the last 168 hours. BNP (last 3 results) No results for input(s): PROBNP in the last 8760 hours. HbA1C: No  results for input(s): HGBA1C in the last 72 hours. CBG: No results for input(s): GLUCAP in the last 168 hours. Lipid Profile: No results for input(s): CHOL, HDL, LDLCALC, TRIG, CHOLHDL, LDLDIRECT in the last 72 hours. Thyroid Function Tests:  Recent Labs  04/30/17 0600  TSH 0.483   Anemia Panel: No results for input(s): VITAMINB12, FOLATE, FERRITIN, TIBC, IRON, RETICCTPCT in the last 72 hours. Urine analysis:    Component Value Date/Time   COLORURINE YELLOW 03/16/2017 1157   APPEARANCEUR HAZY (A) 03/16/2017 1157   LABSPEC 1.014 03/16/2017 1157   PHURINE 6.0 03/16/2017 1157   GLUCOSEU NEGATIVE 03/16/2017 1157   HGBUR LARGE (A) 03/16/2017 1157   HGBUR negative 05/15/2010 0747   BILIRUBINUR NEGATIVE 03/16/2017 1157   BILIRUBINUR n 02/14/2014 1434   KETONESUR NEGATIVE 03/16/2017 1157   PROTEINUR 100 (A) 03/16/2017 1157   UROBILINOGEN 0.2 06/21/2015 1502   NITRITE NEGATIVE 03/16/2017 1157   LEUKOCYTESUR TRACE (A) 03/16/2017 1157   Sepsis Labs: @LABRCNTIP (procalcitonin:4,lacticidven:4)   ) Recent Results (from the past 240 hour(s))  MRSA PCR Screening     Status: None   Collection Time: 04/30/17  4:57 PM  Result Value Ref Range Status   MRSA by PCR NEGATIVE NEGATIVE Final    Comment:        The GeneXpert MRSA Assay (FDA approved for NASAL specimens only), is one component of a comprehensive MRSA colonization surveillance program. It is not intended to diagnose MRSA infection nor to guide or monitor treatment for MRSA infections.       Radiology Studies: Nm Gi Blood Loss  Result Date: 04/30/2017 CLINICAL DATA:  GI bleed. EXAM: NUCLEAR MEDICINE GASTROINTESTINAL BLEEDING SCAN TECHNIQUE: Sequential abdominal images were obtained following intravenous administration of Tc-51m labeled red blood cells. RADIOPHARMACEUTICALS:  25.3 mCi Tc-18m in-vitro labeled red cells. COMPARISON:  None. FINDINGS: No GI bleed identified. IMPRESSION: No GI bleed identified. Electronically  Signed   By: Dorise Bullion III M.D   On: 04/30/2017 19:47   Ct Abdomen Pelvis W Contrast  Result Date: 04/29/2017 CLINICAL DATA:  Bright red blood in stool. Abdominal pain and distension. Gastric ulcer, dementia. Assess for colitis. EXAM: CT ABDOMEN AND PELVIS WITH CONTRAST TECHNIQUE: Multidetector CT imaging of the abdomen and pelvis was performed using the standard protocol following bolus administration of intravenous contrast. CONTRAST:  140mL ISOVUE-300 IOPAMIDOL (ISOVUE-300) INJECTION 61% COMPARISON:  CT abdomen and pelvis June 22, 2015 FINDINGS: LOWER CHEST: Dependent atelectasis and scarring. Heart size is normal. Annular calcifications. No pericardial effusion. HEPATOBILIARY: Stable 16 mm cyst in dome of the liver. Mild new intrahepatic biliary dilatation. Distended gallbladder with mild wall thickening. No CT findings of cholelithiasis. No extrahepatic biliary dilatation. PANCREAS:  Normal. SPLEEN: Normal. ADRENALS/URINARY TRACT: Kidneys are orthotopic, demonstrating symmetric enhancement. 5 x 8 mm RIGHT and 6 x 10 mm LEFT renal pelvic calculi without hydronephrosis. Mild cortical atrophy bilaterally, RIGHT cortical scarring. Hydronephrosis or solid renal masses. The unopacified ureters are normal in course and caliber. Delayed imaging through the kidneys demonstrates symmetric prompt contrast excretion within the proximal urinary collecting system. Urinary bladder is well distended and unremarkable. Normal adrenal glands. STOMACH/BOWEL: Large hiatal hernia containing the majority of the stomach, superior aspect out of field-of-view. Stool distended rectum at 7.3 cm. Moderate descending and sigmoid colonic diverticulosis. Moderate amount of retained large bowel stool. The appendix is not discretely identified, however there are no inflammatory changes in the right lower quadrant. VASCULAR/LYMPHATIC: Aortoiliac vessels are normal in course and caliber. Moderate to severe calcific atherosclerosis  carotid bifurcations. No lymphadenopathy by CT size criteria. REPRODUCTIVE: Normal. OTHER: No intraperitoneal free fluid or free air. MUSCULOSKELETAL: Nonacute. Small fat containing umbilical hernia. Comminuted healing LEFT inferior and superior pubic rami fractures. Healing bilateral sacroiliac fractures. Minimal grade 1 L4-5 anterolisthesis on degenerative basis. IMPRESSION: 1. Diverticulosis without acute diverticulitis. Moderate retained large bowel stool with stool distended rectum equivocal for fecal impaction. No bowel obstruction. 2. New mild intrahepatic biliary dilatation with distended gallbladder, possible acute or chronic cholecystitis. Recommend ultrasound. 3. Bilateral nonobstructing nephrolithiasis. 4. Large hiatal hernia. 5. Multiple healing pelvic fractures. Aortic Atherosclerosis (ICD10-I70.0). Electronically Signed   By: Elon Alas M.D.   On: 04/29/2017 22:52   US Abdomen Limited Ruq  Result Date: 04/30/2017 CLINICAL DATA:  RIGHT upper quadrant pain. Possible cholecystitis on today's CT abdomen and pelvis. EXAM: ULTRASOUND ABDOMEN LIMITED RIGHT UPPER QUADRANT COMPARISON:  CT abdomen and pelvis April 29, 2017 FINDINGS: Habitus limited examination, patient was unable to cooperate with imaging directions. Gallbladder: Mild gallbladder distension. Multiple echogenic sludge ball measuring to 16 mm with acoustic enhancement. No pericholecystic fluid. Gallbladder wall thickening at 4 mm. No sonographic Murphy's sign elicited. Common bile duct: Diameter: 12 mm proximally, 6 mm distally.  No choledocholithiasis. Liver: No focal lesion identified. Within normal limits in parenchymal echogenicity. Portal vein is patent on color Doppler imaging with normal direction of blood flow towards the liver. IMPRESSION: 1. Gallbladder sludge and wall thickening without sonographic Murphy sign. Findings suggest chronic cholecystitis. Electronically Signed   By: Elon Alas M.D.   On: 04/30/2017 00:34       Scheduled Meds: . ARIPiprazole  5 mg Oral QHS  . busPIRone  7.5 mg Oral BID  . gabapentin  100 mg Oral QHS  . hydrocortisone sod succinate (SOLU-CORTEF) inj  50 mg Intravenous Q8H  . olopatadine  1 drop Both Eyes BID  . [START ON 05/03/2017] pantoprazole  40 mg Intravenous Q12H  . sertraline  50 mg Oral Daily   Continuous Infusions: . pantoprozole (PROTONIX) infusion 8 mg/hr (05/01/17 0729)  . piperacillin-tazobactam (ZOSYN)  IV 3.375 g (05/01/17 0700)  . potassium chloride       LOS: 1 day    Time spent: 25 minutes  Greater than 50% of the time spent on counseling and coordinating the care.   Leisa Lenz, MD [facility] Pager [phone]  If 7PM-7AM, please contact night-coverage [email or web address] Password [email or web address] 05/01/2017, 8:10 AM

## 2017-05-01 NOTE — Consult Note (Signed)
Reason for Consult: Possible acute cholecystitis Referring Physician: Dr. Koren Jamie [name] is an 83 y.o. female.  HPI: This is an 83 year old female with dementia who is a ward of the state who was brought to the hospital because of bright red blood per rectum/gastrointestinal bleeding.  CT scan  showed a mild intrahepatic biliary dilatation and some mild gallbladder wall thickening with no CT findings of cholelithiasis..  They have actually come down since admission.  Ultrasound demonstrated ultrasound demonstrated mild gallbladder wall thickening and distention with sludge and a dilated proximal common bile duct.  Liver function tests were elevated yesterday and of come down some today.  No leukocytosis.  She denies abdominal pain although I feel the history is reliable because she does not even know why she is in the hospital.  Past Medical History:  Diagnosis Date  . Anxiety   . Chronic pain   . Depression   . Gastric ulcer 05/2011  . Hyperlipidemia   . Hypertension   . Obesity    5'3"    Past Surgical History:  Procedure Laterality Date  . ABDOMINAL HYSTERECTOMY     PARTIAL  . BILATERAL OOPHORECTOMY    . ESOPHAGOGASTRODUODENOSCOPY  08/12/2012   Procedure: ESOPHAGOGASTRODUODENOSCOPY (EGD);  Surgeon: Arta Silence, MD;  Location: [facility];  Service: Endoscopy;  Laterality: Left;  . ESOPHAGOGASTRODUODENOSCOPY (EGD) WITH PROPOFOL N/A 12/28/2014   Procedure: ESOPHAGOGASTRODUODENOSCOPY (EGD) WITH PROPOFOL;  Surgeon: Wilford Corner, MD;  Location: [facility];  Service: Endoscopy;  Laterality: N/A;  . skin biopsy     nose lesion, noncancerous    Family History  Problem Relation Age of Onset  . Hypertension Mother   . Hyperlipidemia Mother   . Heart disease Father        47s  . Cancer Brother     Social History:  reports that she quit smoking about 45 years ago. Her smoking use included Cigarettes. She smoked 1.00 pack per day. She has never used smokeless tobacco. She  reports that she drinks alcohol. She reports that she does not use drugs.  Allergies:  NKDA  Prior to Admission medications   Medication Sig Start Date End Date Taking? Authorizing Provider  acetaminophen (TYLENOL) 325 MG suppository Place 650 mg rectally every 4 (four) hours as needed (oral temp over 101).   Yes [provider]  acetaminophen (TYLENOL) 500 MG tablet Take 1,000 mg by mouth every 8 (eight) hours.   Yes [provider]  ARIPiprazole (ABILIFY) 5 MG tablet Take 1 tablet (5 mg total) by mouth at bedtime. 06/24/15  Yes Charlynne Cousins, MD  busPIRone (BUSPAR) 7.5 MG tablet Take 7.5 mg by mouth 2 (two) times daily.   Yes [provider]  calcium carbonate (OS-CAL - DOSED IN MG OF ELEMENTAL CALCIUM) 1250 (500 Ca) MG tablet Take 1 tablet by mouth 2 (two) times daily with a meal.   Yes [provider]  furosemide (LASIX) 40 MG tablet Take 1 tablet (40 mg total) by mouth daily. 06/24/15  Yes Charlynne Cousins, MD  gabapentin (NEURONTIN) 100 MG capsule Take 1 capsule (100 mg total) by mouth at bedtime. 06/24/15  Yes Charlynne Cousins, MD  Hypromellose (ISOPTO TEARS) 0.5 % SOLN Apply 1 drop to eye 3 (three) times daily. One drop in each eye, three times daily. Patient taking differently: Place 1 drop into both eyes 3 (three) times daily.  06/24/15  Yes Charlynne Cousins, MD  magnesium oxide (MAG-OX) 400 MG tablet Take 400  mg by mouth daily.   Yes [provider]  Melatonin 1 MG TABS Take 1 mg by mouth at bedtime.    Yes [provider]  olopatadine (PATANOL) 0.1 % ophthalmic solution Place 1 drop into both eyes 2 (two) times daily. 06/24/15  Yes Charlynne Cousins, MD  oxyCODONE (ROXICODONE) 5 MG immediate release tablet Take 0.5 tablets (2.5 mg total) by mouth every 6 (six) hours as needed for severe pain. Patient taking differently: Take 2.5 mg by mouth every 4 (four) hours as needed for severe pain.  03/27/17  Yes Khatri,  Hina, PA-C  pantoprazole (PROTONIX) 40 MG tablet Take 1 tablet (40 mg total) by mouth 2 (two) times daily before a meal. Patient taking differently: Take 40 mg by mouth daily.  06/24/15  Yes Charlynne Cousins, MD  polyethylene glycol [facility]) packet Take 17 g by mouth daily. 06/24/15  Yes Charlynne Cousins, MD  potassium chloride SA (K-DUR,KLOR-CON) 20 MEQ tablet Take 60 mEq by mouth daily.   Yes [provider]  predniSONE (DELTASONE) 5 MG tablet Take 1 tablet (5 mg total) by mouth daily with breakfast. Patient taking differently: Take 10 mg by mouth daily with breakfast.  06/24/15  Yes Charlynne Cousins, MD  senna (SENOKOT) 8.6 MG TABS tablet Take 1 tablet (8.6 mg total) by mouth at bedtime. 06/24/15  Yes Charlynne Cousins, MD  senna-docusate (SENOKOT-S) 8.6-50 MG tablet Take 1 tablet by mouth 2 (two) times daily as needed for mild constipation.   Yes [provider]  sertraline (ZOLOFT) 50 MG tablet Take 1 tablet (50 mg total) by mouth daily. 06/24/15  Yes Charlynne Cousins, MD  cephALEXin (KEFLEX) 250 MG capsule Take 1 capsule (250 mg total) by mouth 2 (two) times daily. Patient not taking: Reported on 03/25/2017 03/16/17   Dorie Rank, MD  fluticasone [facility]) 50 MCG/ACT nasal spray Place 2 sprays into both nostrils daily. Patient not taking: Reported on 03/16/2017 06/24/15   Charlynne Cousins, MD  potassium chloride (K-DUR) 10 MEQ tablet Take 2 tablets (20 mEq total) by mouth daily. Patient not taking: Reported on 03/16/2017 06/24/15   Charlynne Cousins, MD  risperiDONE (RISPERDAL) 0.5 MG tablet Take 1-2 tablets (0.5-1 mg total) by mouth 2 (two) times daily. 1 tab in the morning and 2 tabs at bedtime Patient not taking: Reported on 03/16/2017 06/24/15   Charlynne Cousins, MD  risperiDONE (RISPERDAL) 1 MG tablet Take 1 tablet (1 mg total) by mouth at bedtime. Patient not taking: Reported on 03/16/2017 06/24/15   Charlynne Cousins, MD   traMADol (ULTRAM) 50 MG tablet Take 1 tablet (50 mg total) by mouth every 8 (eight) hours as needed (for pain). 05/01/17   Robbie Lis, MD     Results for orders placed or performed during the hospital encounter of 04/29/17 (from the past 48 hour(s))  Comprehensive metabolic panel     Status: Abnormal   Collection Time: 04/29/17  7:33 PM  Result Value Ref Range   Sodium 141 135 - 145 mmol/L   Potassium 3.9 3.5 - 5.1 mmol/L   Chloride 104 101 - 111 mmol/L   CO2 29 22 - 32 mmol/L   Glucose, Bld 91 65 - 99 mg/dL   BUN 19 6 - 20 mg/dL   Creatinine, Ser 1.09 (H) 0.44 - 1.00 mg/dL   Calcium 9.6 8.9 - 10.3 mg/dL   Total Protein 6.6 6.5 - 8.1 g/dL   Albumin 3.8 3.5 -  5.0 g/dL   AST 249 (H) 15 - 41 U/L   ALT 390 (H) 14 - 54 U/L   Alkaline Phosphatase 602 (H) 38 - 126 U/L   Total Bilirubin 0.7 0.3 - 1.2 mg/dL   GFR calc non Af Amer 45 (L) >60 mL/min   GFR calc Af Amer 52 (L) >60 mL/min    Comment: (NOTE) The eGFR has been calculated using the CKD EPI equation. This calculation has not been validated in all clinical situations. eGFR's persistently <60 mL/min signify possible Chronic Kidney Disease.    Anion gap 8 5 - 15  CBC     Status: Abnormal   Collection Time: 04/29/17  7:33 PM  Result Value Ref Range   WBC 9.8 4.0 - 10.5 K/uL   RBC 4.51 3.87 - 5.11 MIL/uL   Hemoglobin 13.2 12.0 - 15.0 g/dL   HCT 41.1 36.0 - 46.0 %   MCV 91.1 78.0 - 100.0 fL   MCH 29.3 26.0 - 34.0 pg   MCHC 32.1 30.0 - 36.0 g/dL   RDW 15.9 (H) 11.5 - 15.5 %   Platelets 320 150 - 400 K/uL  Lipase, blood     Status: None   Collection Time: 04/29/17  7:33 PM  Result Value Ref Range   Lipase 36 11 - 51 U/L  Type and screen [place]     Status: None   Collection Time: 04/29/17  7:35 PM  Result Value Ref Range   ABO/RH(D) O POS    Antibody Screen NEG    Sample Expiration 05/02/2017   POC occult blood, ED     Status: Abnormal   Collection Time: 04/29/17  9:44 PM  Result Value Ref  Range   Fecal Occult Bld POSITIVE (A) NEGATIVE  I-Stat CG4 Lactic Acid, ED     Status: None   Collection Time: 04/29/17 10:26 PM  Result Value Ref Range   Lactic Acid, Venous 0.92 0.5 - 1.9 mmol/L  CBC     Status: Abnormal   Collection Time: 04/29/17 11:50 PM  Result Value Ref Range   WBC 9.9 4.0 - 10.5 K/uL   RBC 4.15 3.87 - 5.11 MIL/uL   Hemoglobin 12.1 12.0 - 15.0 g/dL   HCT 37.0 36.0 - 46.0 %   MCV 89.2 78.0 - 100.0 fL   MCH 29.2 26.0 - 34.0 pg   MCHC 32.7 30.0 - 36.0 g/dL   RDW 15.8 (H) 11.5 - 15.5 %   Platelets 286 150 - 400 K/uL  CBC     Status: Abnormal   Collection Time: 04/30/17  5:48 AM  Result Value Ref Range   WBC 9.0 4.0 - 10.5 K/uL   RBC 4.00 3.87 - 5.11 MIL/uL   Hemoglobin 11.6 (L) 12.0 - 15.0 g/dL   HCT 36.3 36.0 - 46.0 %   MCV 90.8 78.0 - 100.0 fL   MCH 29.0 26.0 - 34.0 pg   MCHC 32.0 30.0 - 36.0 g/dL   RDW 16.1 (H) 11.5 - 15.5 %   Platelets 277 150 - 400 K/uL  Comprehensive metabolic panel     Status: Abnormal   Collection Time: 04/30/17  6:00 AM  Result Value Ref Range   Sodium 139 135 - 145 mmol/L   Potassium 3.8 3.5 - 5.1 mmol/L   Chloride 107 101 - 111 mmol/L   CO2 24 22 - 32 mmol/L   Glucose, Bld 109 (H) 65 - 99 mg/dL   BUN 16 6 - 20 mg/dL  Creatinine, Ser 0.94 0.44 - 1.00 mg/dL   Calcium 8.8 (L) 8.9 - 10.3 mg/dL   Total Protein 5.3 (L) 6.5 - 8.1 g/dL   Albumin 2.9 (L) 3.5 - 5.0 g/dL   AST 126 (H) 15 - 41 U/L   ALT 273 (H) 14 - 54 U/L   Alkaline Phosphatase 482 (H) 38 - 126 U/L   Total Bilirubin 0.9 0.3 - 1.2 mg/dL   GFR calc non Af Amer 54 (L) >60 mL/min   GFR calc Af Amer >60 >60 mL/min    Comment: (NOTE) The eGFR has been calculated using the CKD EPI equation. This calculation has not been validated in all clinical situations. eGFR's persistently <60 mL/min signify possible Chronic Kidney Disease.    Anion gap 8 5 - 15  Magnesium     Status: None   Collection Time: 04/30/17  6:00 AM  Result Value Ref Range   Magnesium 1.9 1.7 -  2.4 mg/dL  Phosphorus     Status: None   Collection Time: 04/30/17  6:00 AM  Result Value Ref Range   Phosphorus 3.0 2.5 - 4.6 mg/dL  TSH     Status: None   Collection Time: 04/30/17  6:00 AM  Result Value Ref Range   TSH 0.483 0.350 - 4.500 uIU/mL    Comment: Performed by a 3rd Generation assay with a functional sensitivity of <=0.01 uIU/mL.  MRSA PCR Screening     Status: None   Collection Time: 04/30/17  4:57 PM  Result Value Ref Range   MRSA by PCR NEGATIVE NEGATIVE    Comment:        The GeneXpert MRSA Assay (FDA approved for NASAL specimens only), is one component of a comprehensive MRSA colonization surveillance program. It is not intended to diagnose MRSA infection nor to guide or monitor treatment for MRSA infections.   CBC     Status: Abnormal   Collection Time: 05/01/17  4:55 AM  Result Value Ref Range   WBC 9.3 4.0 - 10.5 K/uL   RBC 3.92 3.87 - 5.11 MIL/uL   Hemoglobin 11.5 (L) 12.0 - 15.0 g/dL   HCT 35.0 (L) 36.0 - 46.0 %   MCV 89.3 78.0 - 100.0 fL   MCH 29.3 26.0 - 34.0 pg   MCHC 32.9 30.0 - 36.0 g/dL   RDW 15.9 (H) 11.5 - 15.5 %   Platelets 281 150 - 400 K/uL  Basic metabolic panel     Status: Abnormal   Collection Time: 05/01/17  4:55 AM  Result Value Ref Range   Sodium 145 135 - 145 mmol/L   Potassium 3.2 (L) 3.5 - 5.1 mmol/L   Chloride 111 101 - 111 mmol/L   CO2 24 22 - 32 mmol/L   Glucose, Bld 109 (H) 65 - 99 mg/dL   BUN 20 6 - 20 mg/dL   Creatinine, Ser 0.90 0.44 - 1.00 mg/dL   Calcium 9.1 8.9 - 10.3 mg/dL   GFR calc non Af Amer 57 (L) >60 mL/min   GFR calc Af Amer >60 >60 mL/min    Comment: (NOTE) The eGFR has been calculated using the CKD EPI equation. This calculation has not been validated in all clinical situations. eGFR's persistently <60 mL/min signify possible Chronic Kidney Disease.    Anion gap 10 5 - 15    Nm Gi Blood Loss  Result Date: 04/30/2017 CLINICAL DATA:  GI bleed. EXAM: NUCLEAR MEDICINE GASTROINTESTINAL BLEEDING SCAN  TECHNIQUE: Sequential abdominal images were obtained following intravenous  administration of Tc-69mlabeled red blood cells. RADIOPHARMACEUTICALS:  25.3 mCi Tc-965mn-vitro labeled red cells. COMPARISON:  None. FINDINGS: No GI bleed identified. IMPRESSION: No GI bleed identified. Electronically Signed   By: DaDorise BullionII M.D   On: 04/30/2017 19:47   Ct Abdomen Pelvis W Contrast  Result Date: 04/29/2017 CLINICAL DATA:  Bright red blood in stool. Abdominal pain and distension. Gastric ulcer, dementia. Assess for colitis. EXAM: CT ABDOMEN AND PELVIS WITH CONTRAST TECHNIQUE: Multidetector CT imaging of the abdomen and pelvis was performed using the standard protocol following bolus administration of intravenous contrast. CONTRAST:  10019mSOVUE-300 IOPAMIDOL (ISOVUE-300) INJECTION 61% COMPARISON:  CT abdomen and pelvis June 22, 2015 FINDINGS: LOWER CHEST: Dependent atelectasis and scarring. Heart size is normal. Annular calcifications. No pericardial effusion. HEPATOBILIARY: Stable 16 mm cyst in dome of the liver. Mild new intrahepatic biliary dilatation. Distended gallbladder with mild wall thickening. No CT findings of cholelithiasis. No extrahepatic biliary dilatation. PANCREAS: Normal. SPLEEN: Normal. ADRENALS/URINARY TRACT: Kidneys are orthotopic, demonstrating symmetric enhancement. 5 x 8 mm RIGHT and 6 x 10 mm LEFT renal pelvic calculi without hydronephrosis. Mild cortical atrophy bilaterally, RIGHT cortical scarring. Hydronephrosis or solid renal masses. The unopacified ureters are normal in course and caliber. Delayed imaging through the kidneys demonstrates symmetric prompt contrast excretion within the proximal urinary collecting system. Urinary bladder is well distended and unremarkable. Normal adrenal glands. STOMACH/BOWEL: Large hiatal hernia containing the majority of the stomach, superior aspect out of field-of-view. Stool distended rectum at 7.3 cm. Moderate descending and sigmoid colonic  diverticulosis. Moderate amount of retained large bowel stool. The appendix is not discretely identified, however there are no inflammatory changes in the right lower quadrant. VASCULAR/LYMPHATIC: Aortoiliac vessels are normal in course and caliber. Moderate to severe calcific atherosclerosis carotid bifurcations. No lymphadenopathy by CT size criteria. REPRODUCTIVE: Normal. OTHER: No intraperitoneal free fluid or free air. MUSCULOSKELETAL: Nonacute. Small fat containing umbilical hernia. Comminuted healing LEFT inferior and superior pubic rami fractures. Healing bilateral sacroiliac fractures. Minimal grade 1 L4-5 anterolisthesis on degenerative basis. IMPRESSION: 1. Diverticulosis without acute diverticulitis. Moderate retained large bowel stool with stool distended rectum equivocal for fecal impaction. No bowel obstruction. 2. New mild intrahepatic biliary dilatation with distended gallbladder, possible acute or chronic cholecystitis. Recommend ultrasound. 3. Bilateral nonobstructing nephrolithiasis. 4. Large hiatal hernia. 5. Multiple healing pelvic fractures. Aortic Atherosclerosis (ICD10-I70.0). Electronically Signed   By: CouElon AlasD.   On: 04/29/2017 22:52   [employer]  Result Date: 04/30/2017 CLINICAL DATA:  RIGHT upper quadrant pain. Possible cholecystitis on today's CT abdomen and pelvis. EXAM: ULTRASOUND ABDOMEN LIMITED RIGHT UPPER QUADRANT COMPARISON:  CT abdomen and pelvis April 29, 2017 FINDINGS: [place] limited examination, patient was unable to cooperate with imaging directions. Gallbladder: Mild gallbladder distension. Multiple echogenic sludge ball measuring to 16 mm with acoustic enhancement. No pericholecystic fluid. Gallbladder wall thickening at 4 mm. No sonographic Murphy's sign elicited. Common bile duct: Diameter: 12 mm proximally, 6 mm distally.  No choledocholithiasis. Liver: No focal lesion identified. Within normal limits in parenchymal echogenicity. Portal  vein is patent on color Doppler imaging with normal direction of blood flow towards the liver. IMPRESSION: 1. Gallbladder sludge and wall thickening without sonographic Murphy sign. Findings suggest chronic cholecystitis. Electronically Signed   By: CouElon AlasD.   On: 04/30/2017 00:34    Review of Systems  Unable to perform ROS: Dementia   Blood pressure 133/79, pulse 75, temperature 98 F (36.7 C), temperature source Oral, resp.  rate 18, height 5' 2"  (1.575 m), weight 64.4 kg (141 lb 15.6 oz), SpO2 95 %. Physical Exam GENERAL APPEARANCE:  Elderly in NAD.  Pleasant and confused.  EARS, NOSE, MOUTH THROAT:  [place]/AT external ears:  no lesions or deformities external nose:  no lesions or deformities hearing:  grossly normal lips:  moist, no deformities EYES external: conjunctiva, lids, sclerae normal pupils:  equal, round glasses: no  CV ascultation:  RRR, with loud murmur extremity edema:  no extremity varicosities:  yes  RESP auscultation:  breath sounds equal and clear respiratory effort:  normal  GASTROINTESTINAL abdomen:  Soft, non-tender, non-distended, no masses liver and spleen:  not enlarged. hernia:  none present  SKIN rash or lesion: none No jaundice  NEUROLOGIC speech:  normal  PSYCHIATRIC alertness and orientation:  Not oriented mood/affect/behavior:  normal judgement and insight:  normal  Assessment/Plan: Mild gallbladder distention some elevation of liver function tests which are trending down.  No clinical signs of infection.  She may have transiently passed some sludge.  Liver function tests are improving.  She is on empiric Zosyn for possible cholecystitis.  Plan: Given her improvement, I do not see an indication for cholecystectomy especially given her other comorbid conditions.  Would continue the empiric antibiotics and advance her diet.  Maikayla Beggs J 05/01/2017, 12:49 PM

## 2017-05-01 NOTE — Evaluation (Signed)
Physical Therapy Evaluation Patient Details Name: Jamie [name] MRN: [id number] DOB: Jun 19, 1930 Today's Date: 05/01/2017   History of Present Illness  This is an 83 year old female with dementia who is a ward of the state who was brought to the hospital because of bright red blood per rectum/gastrointestinal bleeding.  CT scan  showed a mild intrahepatic biliary dilatation and some mild gallbladder wall thickening with no CT findings of cholelithiasis..   Clinical Impression  The patient  Required encouragement to pariticipate, did sit at bed edge briefly. Should be able to return to ALF. Pt admitted with above diagnosis. Pt currently with functional limitations due to the deficits listed below (see PT Problem List).  Pt will benefit from skilled PT to increase their independence and safety with mobility to allow discharge to the venue listed below.       Follow Up Recommendations Home health PT (if ALF is able to provide care.)    Equipment Recommendations  None recommended by PT    Recommendations for Other Services       Precautions / Restrictions Precautions Precautions: Fall      Mobility  Bed Mobility Overal bed mobility: Needs Assistance Bed Mobility: Supine to Sit;Sit to Supine     Supine to sit: Mod assist Sit to supine: Mod assist   General bed mobility comments: much encouragement to get the patient to work with PT. assisted to sitting x 2 minutes, then assisted self back into bed.  Transfers                 General transfer comment: patient did not participate. RN stated that patient stood for nursing x 1.  Ambulation/Gait                Stairs            Wheelchair Mobility    Modified Rankin (Stroke Patients Only)       Balance Overall balance assessment: Needs assistance Sitting-balance support: Feet supported;Bilateral upper extremity supported Sitting balance-Leahy Scale: Fair                                        Pertinent Vitals/Pain Pain Assessment: No/denies pain    Home Living Family/patient expects to be discharged to:: Assisted living                 Additional Comments: per chart , from ALF    Prior Function           Comments: uncertain of prior level of function     Hand Dominance        Extremity/Trunk Assessment   Upper Extremity Assessment Upper Extremity Assessment: Generalized weakness    Lower Extremity Assessment Lower Extremity Assessment: Generalized weakness    Cervical / Trunk Assessment Cervical / Trunk Assessment: Kyphotic  Communication   Communication:  (ver little communication)  Cognition Arousal/Alertness: Awake/alert   Overall Cognitive Status: History of cognitive impairments - at baseline                                        General Comments      Exercises     Assessment/Plan    PT Assessment Patient needs continued PT services  PT Problem List Decreased cognition;Decreased knowledge of use of DME;Decreased activity tolerance;Decreased mobility  PT Treatment Interventions Gait training;DME instruction;Functional mobility training;Therapeutic activities;Patient/family education    PT Goals (Current goals can be found in the Care Plan section)  Acute Rehab PT Goals PT Goal Formulation: Patient unable to participate in goal setting Time For Goal Achievement: 05/15/17 Potential to Achieve Goals: Fair    Frequency Min 2X/week   Barriers to discharge        Co-evaluation               AM-PAC PT "6 Clicks" Daily Activity  Outcome Measure Difficulty turning over in bed (including adjusting bedclothes, sheets and blankets)?: Unable Difficulty moving from lying on back to sitting on the side of the bed? : Unable Difficulty sitting down on and standing up from a chair with arms (e.g., wheelchair, bedside commode, etc,.)?: Unable Help needed moving to and from a bed to chair (including a  wheelchair)?: Total Help needed walking in hospital room?: Total Help needed climbing 3-5 steps with a railing? : Total 6 Click Score: 6    End of Session   Activity Tolerance: Patient tolerated treatment well Patient left: in bed;with bed alarm set;with call bell/phone within reach Nurse Communication: Mobility status PT Visit Diagnosis: Difficulty in walking, not elsewhere classified (R26.2)    Time: 3818-2993 PT Time Calculation (min) (ACUTE ONLY): 11 min   Charges:   PT Evaluation $PT Eval Low Complexity: 1 Low     PT G CodesTresa [name] PT [phone]   Jamie [name] 05/01/2017, 4:35 PM

## 2017-05-01 NOTE — Care Management Note (Signed)
Case Management Note  Patient Details  Name: TIWATOPE EMMITT MRN: [id number] Date of Birth: 09-21-1929  Subjective/Objective: 83 y/o f admitted w/Acute blood loss anemia. From ALF-[facility]-CSW already following.                  Action/Plan:d/c plan ALF.   Expected Discharge Date:  05/01/17               Expected Discharge Plan:  Assisted Living / Rest Home  In-House Referral:  Clinical Social Work  Discharge planning Services  CM Consult  Post Acute Care Choice:    Choice offered to:     DME Arranged:    DME Agency:     HH Arranged:    HH Agency:     Status of Service:  In process, will continue to follow  If discussed at Long Length of Stay Meetings, dates discussed:    Additional Comments:  Dessa Phi, RN 05/01/2017, 9:46 AM

## 2017-05-01 NOTE — Progress Notes (Signed)
CSW consulted to assist with dc planning. Pt is from [facility]. CSW will follow to assist with dc planning.  Werner Lean LCSW [phone]

## 2017-05-01 NOTE — Discharge Instructions (Signed)
Gastrointestinal Bleeding °Gastrointestinal bleeding is bleeding somewhere along the path food travels through the body (digestive tract). This path is anywhere between the mouth and the opening of the butt (anus). You may have blood in your poop (stools) or have black poop. If you throw up (vomit), there may be blood in it. °This condition can be mild, serious, or even life-threatening. If you have a lot of bleeding, you may need to stay in the hospital. °Follow these instructions at home: °· Take over-the-counter and prescription medicines only as told by your doctor. °· Eat foods that have a lot of fiber in them. These foods include whole grains, fruits, and vegetables. You can also try eating 1-3 prunes each day. °· Drink enough fluid to keep your pee (urine) clear or pale yellow. °· Keep all follow-up visits as told by your doctor. This is important. °Contact a doctor if: °· Your symptoms do not get better. °Get help right away if: °· Your bleeding gets worse. °· You feel dizzy or you pass out (faint). °· You feel weak. °· You have very bad cramps in your back or belly (abdomen). °· You pass large clumps of blood (clots) in your poop. °· Your symptoms are getting worse. °This information is not intended to replace advice given to you by your health care provider. Make sure you discuss any questions you have with your health care provider. °Document Released: 05/27/2008 Document Revised: 01/24/2016 Document Reviewed: 02/05/2015 °Elsevier Interactive Patient Education © 2018 [employer] ° °

## 2017-05-01 NOTE — Progress Notes (Signed)
Per Dr Charlies Silvers GI to see prior to D/C.

## 2017-05-01 NOTE — Progress Notes (Addendum)
Subjective: The patient was seen and examined at bedside. It has been no further bowel movements since yesterday. Further evidence of rectal bleeding/maroon stools. Bleeding scan was negative for active bleeding. She has been started on a regular diet.  Objective: Vital signs in last 24 hours: Temp:  [98 F (36.7 C)-98.4 F (36.9 C)] 98 F (36.7 C) (08/31 0444) Pulse Rate:  [65-84] 75 (08/31 0444) Resp:  [16-27] 18 (08/31 0444) BP: (103-139)/(57-91) 133/79 (08/31 0444) SpO2:  [95 %-98 %] 95 % (08/31 0444) Weight:  [64.4 kg (141 lb 15.6 oz)] 64.4 kg (141 lb 15.6 oz) (08/30 1614) Weight change:  Last BM Date: 04/30/17  PE: Not in acute distress,no obvious pallor GENERAL: Appears comfortable ABDOMEN: Soft, nondistended, nontender, normoactive bowel sounds EXTREMITIES: No edema  Lab Results: Results for orders placed or performed during the hospital encounter of 04/29/17 (from the past 48 hour(s))  Comprehensive metabolic panel     Status: Abnormal   Collection Time: 04/29/17  7:33 PM  Result Value Ref Range   Sodium 141 135 - 145 mmol/L   Potassium 3.9 3.5 - 5.1 mmol/L   Chloride 104 101 - 111 mmol/L   CO2 29 22 - 32 mmol/L   Glucose, Bld 91 65 - 99 mg/dL   BUN 19 6 - 20 mg/dL   Creatinine, Ser 1.09 (H) 0.44 - 1.00 mg/dL   Calcium 9.6 8.9 - 10.3 mg/dL   Total Protein 6.6 6.5 - 8.1 g/dL   Albumin 3.8 3.5 - 5.0 g/dL   AST 249 (H) 15 - 41 U/L   ALT 390 (H) 14 - 54 U/L   Alkaline Phosphatase 602 (H) 38 - 126 U/L   Total Bilirubin 0.7 0.3 - 1.2 mg/dL   GFR calc non Af Amer 45 (L) >60 mL/min   GFR calc Af Amer 52 (L) >60 mL/min    Comment: (NOTE) The eGFR has been calculated using the CKD EPI equation. This calculation has not been validated in all clinical situations. eGFR's persistently <60 mL/min signify possible Chronic Kidney Disease.    Anion gap 8 5 - 15  CBC     Status: Abnormal   Collection Time: 04/29/17  7:33 PM  Result Value Ref Range   WBC 9.8 4.0 - 10.5  K/uL   RBC 4.51 3.87 - 5.11 MIL/uL   Hemoglobin 13.2 12.0 - 15.0 g/dL   HCT 41.1 36.0 - 46.0 %   MCV 91.1 78.0 - 100.0 fL   MCH 29.3 26.0 - 34.0 pg   MCHC 32.1 30.0 - 36.0 g/dL   RDW 15.9 (H) 11.5 - 15.5 %   Platelets 320 150 - 400 K/uL  Lipase, blood     Status: None   Collection Time: 04/29/17  7:33 PM  Result Value Ref Range   Lipase 36 11 - 51 U/L  Type and screen [place]     Status: None   Collection Time: 04/29/17  7:35 PM  Result Value Ref Range   ABO/RH(D) O POS    Antibody Screen NEG    Sample Expiration 05/02/2017   POC occult blood, ED     Status: Abnormal   Collection Time: 04/29/17  9:44 PM  Result Value Ref Range   Fecal Occult Bld POSITIVE (A) NEGATIVE  I-Stat CG4 Lactic Acid, ED     Status: None   Collection Time: 04/29/17 10:26 PM  Result Value Ref Range   Lactic Acid, Venous 0.92 0.5 - 1.9 mmol/L  CBC  Status: Abnormal   Collection Time: 04/29/17 11:50 PM  Result Value Ref Range   WBC 9.9 4.0 - 10.5 K/uL   RBC 4.15 3.87 - 5.11 MIL/uL   Hemoglobin 12.1 12.0 - 15.0 g/dL   HCT 37.0 36.0 - 46.0 %   MCV 89.2 78.0 - 100.0 fL   MCH 29.2 26.0 - 34.0 pg   MCHC 32.7 30.0 - 36.0 g/dL   RDW 15.8 (H) 11.5 - 15.5 %   Platelets 286 150 - 400 K/uL  CBC     Status: Abnormal   Collection Time: 04/30/17  5:48 AM  Result Value Ref Range   WBC 9.0 4.0 - 10.5 K/uL   RBC 4.00 3.87 - 5.11 MIL/uL   Hemoglobin 11.6 (L) 12.0 - 15.0 g/dL   HCT 36.3 36.0 - 46.0 %   MCV 90.8 78.0 - 100.0 fL   MCH 29.0 26.0 - 34.0 pg   MCHC 32.0 30.0 - 36.0 g/dL   RDW 16.1 (H) 11.5 - 15.5 %   Platelets 277 150 - 400 K/uL  Comprehensive metabolic panel     Status: Abnormal   Collection Time: 04/30/17  6:00 AM  Result Value Ref Range   Sodium 139 135 - 145 mmol/L   Potassium 3.8 3.5 - 5.1 mmol/L   Chloride 107 101 - 111 mmol/L   CO2 24 22 - 32 mmol/L   Glucose, Bld 109 (H) 65 - 99 mg/dL   BUN 16 6 - 20 mg/dL   Creatinine, Ser 0.94 0.44 - 1.00 mg/dL   Calcium 8.8  (L) 8.9 - 10.3 mg/dL   Total Protein 5.3 (L) 6.5 - 8.1 g/dL   Albumin 2.9 (L) 3.5 - 5.0 g/dL   AST 126 (H) 15 - 41 U/L   ALT 273 (H) 14 - 54 U/L   Alkaline Phosphatase 482 (H) 38 - 126 U/L   Total Bilirubin 0.9 0.3 - 1.2 mg/dL   GFR calc non Af Amer 54 (L) >60 mL/min   GFR calc Af Amer >60 >60 mL/min    Comment: (NOTE) The eGFR has been calculated using the CKD EPI equation. This calculation has not been validated in all clinical situations. eGFR's persistently <60 mL/min signify possible Chronic Kidney Disease.    Anion gap 8 5 - 15  Magnesium     Status: None   Collection Time: 04/30/17  6:00 AM  Result Value Ref Range   Magnesium 1.9 1.7 - 2.4 mg/dL  Phosphorus     Status: None   Collection Time: 04/30/17  6:00 AM  Result Value Ref Range   Phosphorus 3.0 2.5 - 4.6 mg/dL  TSH     Status: None   Collection Time: 04/30/17  6:00 AM  Result Value Ref Range   TSH 0.483 0.350 - 4.500 uIU/mL    Comment: Performed by a 3rd Generation assay with a functional sensitivity of <=0.01 uIU/mL.  MRSA PCR Screening     Status: None   Collection Time: 04/30/17  4:57 PM  Result Value Ref Range   MRSA by PCR NEGATIVE NEGATIVE    Comment:        The GeneXpert MRSA Assay (FDA approved for NASAL specimens only), is one component of a comprehensive MRSA colonization surveillance program. It is not intended to diagnose MRSA infection nor to guide or monitor treatment for MRSA infections.   CBC     Status: Abnormal   Collection Time: 05/01/17  4:55 AM  Result Value Ref Range   WBC  9.3 4.0 - 10.5 K/uL   RBC 3.92 3.87 - 5.11 MIL/uL   Hemoglobin 11.5 (L) 12.0 - 15.0 g/dL   HCT 35.0 (L) 36.0 - 46.0 %   MCV 89.3 78.0 - 100.0 fL   MCH 29.3 26.0 - 34.0 pg   MCHC 32.9 30.0 - 36.0 g/dL   RDW 15.9 (H) 11.5 - 15.5 %   Platelets 281 150 - 400 K/uL  Basic metabolic panel     Status: Abnormal   Collection Time: 05/01/17  4:55 AM  Result Value Ref Range   Sodium 145 135 - 145 mmol/L   Potassium  3.2 (L) 3.5 - 5.1 mmol/L   Chloride 111 101 - 111 mmol/L   CO2 24 22 - 32 mmol/L   Glucose, Bld 109 (H) 65 - 99 mg/dL   BUN 20 6 - 20 mg/dL   Creatinine, Ser 0.90 0.44 - 1.00 mg/dL   Calcium 9.1 8.9 - 10.3 mg/dL   GFR calc non Af Amer 57 (L) >60 mL/min   GFR calc Af Amer >60 >60 mL/min    Comment: (NOTE) The eGFR has been calculated using the CKD EPI equation. This calculation has not been validated in all clinical situations. eGFR's persistently <60 mL/min signify possible Chronic Kidney Disease.    Anion gap 10 5 - 15    Studies/Results: Nm Gi Blood Loss  Result Date: 04/30/2017 CLINICAL DATA:  GI bleed. EXAM: NUCLEAR MEDICINE GASTROINTESTINAL BLEEDING SCAN TECHNIQUE: Sequential abdominal images were obtained following intravenous administration of Tc-20mlabeled red blood cells. RADIOPHARMACEUTICALS:  25.3 mCi Tc-935mn-vitro labeled red cells. COMPARISON:  None. FINDINGS: No GI bleed identified. IMPRESSION: No GI bleed identified. Electronically Signed   By: DaDorise BullionII M.D   On: 04/30/2017 19:47   Ct Abdomen Pelvis W Contrast  Result Date: 04/29/2017 CLINICAL DATA:  Bright red blood in stool. Abdominal pain and distension. Gastric ulcer, dementia. Assess for colitis. EXAM: CT ABDOMEN AND PELVIS WITH CONTRAST TECHNIQUE: Multidetector CT imaging of the abdomen and pelvis was performed using the standard protocol following bolus administration of intravenous contrast. CONTRAST:  [id number]-300 IOPAMIDOL (ISOVUE-300) INJECTION 61% COMPARISON:  CT abdomen and pelvis June 22, 2015 FINDINGS: LOWER CHEST: Dependent atelectasis and scarring. Heart size is normal. Annular calcifications. No pericardial effusion. HEPATOBILIARY: Stable 16 mm cyst in dome of the liver. Mild new intrahepatic biliary dilatation. Distended gallbladder with mild wall thickening. No CT findings of cholelithiasis. No extrahepatic biliary dilatation. PANCREAS: Normal. SPLEEN: Normal. ADRENALS/URINARY TRACT:  Kidneys are orthotopic, demonstrating symmetric enhancement. 5 x 8 mm RIGHT and 6 x 10 mm LEFT renal pelvic calculi without hydronephrosis. Mild cortical atrophy bilaterally, RIGHT cortical scarring. Hydronephrosis or solid renal masses. The unopacified ureters are normal in course and caliber. Delayed imaging through the kidneys demonstrates symmetric prompt contrast excretion within the proximal urinary collecting system. Urinary bladder is well distended and unremarkable. Normal adrenal glands. STOMACH/BOWEL: Large hiatal hernia containing the majority of the stomach, superior aspect out of field-of-view. Stool distended rectum at 7.3 cm. Moderate descending and sigmoid colonic diverticulosis. Moderate amount of retained large bowel stool. The appendix is not discretely identified, however there are no inflammatory changes in the right lower quadrant. VASCULAR/LYMPHATIC: Aortoiliac vessels are normal in course and caliber. Moderate to severe calcific atherosclerosis carotid bifurcations. No lymphadenopathy by CT size criteria. REPRODUCTIVE: Normal. OTHER: No intraperitoneal free fluid or free air. MUSCULOSKELETAL: Nonacute. Small fat containing umbilical hernia. Comminuted healing LEFT inferior and superior pubic rami fractures. Healing bilateral sacroiliac  fractures. Minimal grade 1 L4-5 anterolisthesis on degenerative basis. IMPRESSION: 1. Diverticulosis without acute diverticulitis. Moderate retained large bowel stool with stool distended rectum equivocal for fecal impaction. No bowel obstruction. 2. New mild intrahepatic biliary dilatation with distended gallbladder, possible acute or chronic cholecystitis. Recommend ultrasound. 3. Bilateral nonobstructing nephrolithiasis. 4. Large hiatal hernia. 5. Multiple healing pelvic fractures. Aortic Atherosclerosis (ICD10-I70.0). Electronically Signed   By: Elon Alas M.D.   On: 04/29/2017 22:52   US Abdomen Limited Ruq  Result Date: 04/30/2017 CLINICAL  DATA:  RIGHT upper quadrant pain. Possible cholecystitis on today's CT abdomen and pelvis. EXAM: ULTRASOUND ABDOMEN LIMITED RIGHT UPPER QUADRANT COMPARISON:  CT abdomen and pelvis April 29, 2017 FINDINGS: Habitus limited examination, patient was unable to cooperate with imaging directions. Gallbladder: Mild gallbladder distension. Multiple echogenic sludge ball measuring to 16 mm with acoustic enhancement. No pericholecystic fluid. Gallbladder wall thickening at 4 mm. No sonographic Murphy's sign elicited. Common bile duct: Diameter: 12 mm proximally, 6 mm distally.  No choledocholithiasis. Liver: No focal lesion identified. Within normal limits in parenchymal echogenicity. Portal vein is patent on color Doppler imaging with normal direction of blood flow towards the liver. IMPRESSION: 1. Gallbladder sludge and wall thickening without sonographic Murphy sign. Findings suggest chronic cholecystitis. Electronically Signed   By: Elon Alas M.D.   On: 04/30/2017 00:34    Medications: I have reviewed the patient's current medications.  Assessment: 1. Fecal impaction, maroon blood around impacted stool Negative bleeding scan. Hemoglobin stable since admission(12.1, 11.6, 11.5)  2. Gallbladder wall thickening, no choledocholithiasis on ultrasound, gallbladder sludge Abnormal liver enzymes(alkaline phosphatase 482, normal lipase, AST 126, AST 273, normal total bilirubin 0.9)   Plan: 1. No indication for a colonoscopy/EGD at this point Bleeding likely related to stercoral ulcers from fecal impaction. Recommend MiraLAX 17 g by mouth twice a day regularly.  2. Surgical evaluation for cholecystectomy Discussed with patient's hospitalist Dr. Charlies Silvers. No choledocholithiasis noted on ultrasound. Total bilirubin normal.  We will sign off from GI standpoint. Please reconsult if needed.    Ronnette Juniper 05/01/2017, 10:00 AM   Pager [phone] If no answer or after 5 PM call [phone]

## 2017-05-02 LAB — HEPATIC FUNCTION PANEL
ALT: 120 U/L — AB (ref 14–54)
AST: 32 U/L (ref 15–41)
Albumin: 3 g/dL — ABNORMAL LOW (ref 3.5–5.0)
Alkaline Phosphatase: 356 U/L — ABNORMAL HIGH (ref 38–126)
BILIRUBIN DIRECT: 0.2 mg/dL (ref 0.1–0.5)
BILIRUBIN INDIRECT: 0.2 mg/dL — AB (ref 0.3–0.9)
Total Bilirubin: 0.4 mg/dL (ref 0.3–1.2)
Total Protein: 5.7 g/dL — ABNORMAL LOW (ref 6.5–8.1)

## 2017-05-02 MED ORDER — POTASSIUM CHLORIDE CRYS ER 20 MEQ PO TBCR
40.0000 meq | EXTENDED_RELEASE_TABLET | Freq: Once | ORAL | Status: AC
  Administered 2017-05-02: 40 meq via ORAL
  Filled 2017-05-02: qty 2

## 2017-05-02 MED ORDER — AMOXICILLIN-POT CLAVULANATE 875-125 MG PO TABS: 1.0000 | ORAL_TABLET | Freq: Two times a day (BID) | ORAL | 0 refills | Status: AC

## 2017-05-02 NOTE — Clinical Social Work Note (Signed)
Per MD- patient is stable for d/c today to return to [place].  Fl2 updated and sent w/ d/c summary to [place] at [facility]. Patient is a ward of the state; there is no one to transport her back to the facility. EMS arranged.  Patient is alert to person only.  Per Cristina Gong- patient is ok to return to facility. Nursing notified of above and will call report to the facility.  Message left on office v-mail for [name] [place] [employer] re: return to facility.  No further SW intervention indicated. SW signing off.  Lorie Phenix. Pauline Good, [place] (weekend coverage)

## 2017-05-02 NOTE — Progress Notes (Signed)
Report called and given to nurse Drucilla Chalet at [facility] [place] facility. All nurse's questions answered to his satisfaction. Left phone number [phone] with nurse to call if he had any questions. Hale Bogus.

## 2017-05-02 NOTE — Discharge Summary (Signed)
Physician Discharge Summary  Jamie [name] KGM:[id number] DOB: 1930/03/06 DOA: 04/29/2017  PCP: Kennyth Arnold, FNP  Admit date: 04/29/2017 Discharge date: 05/02/2017  Recommendations for Outpatient Follow-up:  1. Repeat LFT's in 1 week to make sure they are trending down 2. Continue Augmentin for 7 days on discharge   Discharge Diagnoses:  Active Problems:   Hyperlipemia   Essential hypertension   Dementia with behavioral disturbance   GI bleed   Acute blood loss anemia   Anemia   Anemia associated with acute blood loss   Obstipation    Discharge Condition: stable   Diet recommendation: as tolerated   History of present illness:  83 year old female with history of dementia, remote alcohol abuse, depression, history of gastric ulcers, dyslipidemia who presented from skilled nursing facility with bright red blood, maroon blood per rectum. Patient was hemodynamically stable on discharge. GI has seen the patient in consultation. Nuclear scan did not reveal acute bleed. LFTs were elevated and concerning for acute cholecystitis for which reason patient was placed on Zosyn.  Hospital Course:   Active Problems: Acute blood loss anemia / acute lower GI bleed - No clear scan with no evidence of bleeding - Continue protonix - No further bleed - Hgb stable at 11.5  Possible acute cholecystitis / abnormal LFTs - Patient is on empiric Zosyn - No surgical intervention planned  - Will continue Augmentin for 7 days on discharge - Recheck LFT's in 1 week    Hypokalemia - Due to GI related issues - Supplemented  Dementia with behavioral disturbance - Continue Abilify, BuSpar  Depression - Continue zoloft  Obstipation - Manually disimpacted  DVT prophylaxis: SCD's Code Status: DNR/DNI Family Communication: no family at the bedside    Consultants:   GI  Surgery   Procedures:   None  Antimicrobials:   Zosyn 04/30/2017 -->9/1  Signed:  Leisa Lenz,  MD  [facility] 05/02/2017, 11:23 AM  Pager #: [phone]  Time spent in minutes: more than 30 minutes    Discharge Exam: Vitals:   05/01/17 2039 05/02/17 0354  BP: 111/68 102/63  Pulse: 67 64  Resp: 20 18  Temp: 98.2 F (36.8 C) 97.8 F (36.6 C)  SpO2: 96% 98%   Vitals:   05/01/17 1348 05/01/17 1657 05/01/17 2039 05/02/17 0354  BP: 129/63 (!) 154/54 111/68 102/63  Pulse: 76 64 67 64  Resp: 18 18 20 18   Temp:  97.8 F (36.6 C) 98.2 F (36.8 C) 97.8 F (36.6 C)  TempSrc: Oral Oral Oral Oral  SpO2: 96% 97% 96% 98%  Weight:      Height:        General: Pt is awake, disoriented  Cardiovascular: Regular rate and rhythm, S1/S2 (+) Respiratory: Clear to auscultation bilaterally, no wheezing, no crackles, no rhonchi Abdominal: Soft, non tender, non distended, bowel sounds +, no guarding Extremities: no edema, no cyanosis Neuro: disoriented   Discharge Instructions  Discharge Instructions    Call MD for:  persistant nausea and vomiting    Complete by:  As directed    Call MD for:  redness, tenderness, or signs of infection (pain, swelling, redness, odor or green/yellow discharge around incision site)    Complete by:  As directed    Call MD for:  severe uncontrolled pain    Complete by:  As directed    Call MD for:  severe uncontrolled pain    Complete by:  As directed    Diet - low sodium heart healthy  Complete by:  As directed    Diet - low sodium heart healthy    Complete by:  As directed    Increase activity slowly    Complete by:  As directed    Increase activity slowly    Complete by:  As directed      Allergies as of 05/02/2017      Reactions   Pollen Extract Other (See Comments)   Swollen Eyes & Runny Nose.      Medication List    STOP taking these medications   cephALEXin 250 MG capsule Commonly known as:  KEFLEX   fluticasone 50 MCG/ACT nasal spray Commonly known as:  FLONASE   oxyCODONE 5 MG immediate release tablet Commonly  known as:  ROXICODONE   potassium chloride 10 MEQ tablet Commonly known as:  K-DUR   risperiDONE 0.5 MG tablet Commonly known as:  RISPERDAL   risperiDONE 1 MG tablet Commonly known as:  RISPERDAL     TAKE these medications   acetaminophen 500 MG tablet Commonly known as:  TYLENOL Take 1,000 mg by mouth every 8 (eight) hours.   acetaminophen 325 MG suppository Commonly known as:  TYLENOL Place 650 mg rectally every 4 (four) hours as needed (oral temp over 101).   amoxicillin-clavulanate 875-125 MG tablet Commonly known as:  AUGMENTIN Take 1 tablet by mouth 2 (two) times daily.   ARIPiprazole 5 MG tablet Commonly known as:  ABILIFY Take 1 tablet (5 mg total) by mouth at bedtime.   busPIRone 7.5 MG tablet Commonly known as:  BUSPAR Take 7.5 mg by mouth 2 (two) times daily.   calcium carbonate 1250 (500 Ca) MG tablet Commonly known as:  OS-CAL - dosed in mg of elemental calcium Take 1 tablet by mouth 2 (two) times daily with a meal.   furosemide 40 MG tablet Commonly known as:  LASIX Take 1 tablet (40 mg total) by mouth daily.   gabapentin 100 MG capsule Commonly known as:  NEURONTIN Take 1 capsule (100 mg total) by mouth at bedtime.   Hypromellose 0.5 % Soln Commonly known as:  ISOPTO TEARS Apply 1 drop to eye 3 (three) times daily. One drop in each eye, three times daily. What changed:  how to take this  additional instructions   magnesium oxide 400 MG tablet Commonly known as:  MAG-OX Take 400 mg by mouth daily.   Melatonin 1 MG Tabs Take 1 mg by mouth at bedtime.   olopatadine 0.1 % ophthalmic solution Commonly known as:  PATANOL Place 1 drop into both eyes 2 (two) times daily.   pantoprazole 40 MG tablet Commonly known as:  PROTONIX Take 1 tablet (40 mg total) by mouth 2 (two) times daily before a meal. What changed:  when to take this   polyethylene glycol packet Commonly known as:  MIRALAX / GLYCOLAX Take 17 g by mouth daily.   potassium  chloride SA 20 MEQ tablet Commonly known as:  K-DUR,KLOR-CON Take 60 mEq by mouth daily.   predniSONE 5 MG tablet Commonly known as:  DELTASONE Take 1 tablet (5 mg total) by mouth daily with breakfast. What changed:  how much to take   senna 8.6 MG Tabs tablet Commonly known as:  SENOKOT Take 1 tablet (8.6 mg total) by mouth at bedtime.   senna-docusate 8.6-50 MG tablet Commonly known as:  Senokot-S Take 1 tablet by mouth 2 (two) times daily as needed for mild constipation.   sertraline 50 MG tablet Commonly known as:  ZOLOFT Take 1 tablet (50 mg total) by mouth daily.   traMADol 50 MG tablet Commonly known as:  ULTRAM Take 1 tablet (50 mg total) by mouth every 8 (eight) hours as needed (for pain).            Discharge Care Instructions        Start     Ordered   05/02/17 0000  Increase activity slowly     05/02/17 1122   05/02/17 0000  Diet - low sodium heart healthy     05/02/17 1122   05/02/17 0000  Call MD for:  persistant nausea and vomiting     05/02/17 1122   05/02/17 0000  Call MD for:  severe uncontrolled pain     05/02/17 1122   05/02/17 0000  amoxicillin-clavulanate (AUGMENTIN) 875-125 MG tablet  2 times daily     05/02/17 1126   05/01/17 0000  traMADol (ULTRAM) 50 MG tablet  Every 8 hours PRN     05/01/17 0808   05/01/17 0000  Increase activity slowly     05/01/17 0808   05/01/17 0000  Diet - low sodium heart healthy     05/01/17 0808   05/01/17 0000  Call MD for:  severe uncontrolled pain     05/01/17 0808   05/01/17 0000  Call MD for:  redness, tenderness, or signs of infection (pain, swelling, redness, odor or green/yellow discharge around incision site)     05/01/17 4098      Follow-up Information    Kennyth Arnold, FNP. Schedule an appointment as soon as possible for a visit in 1 week(s).   Specialty:  Family Medicine Contact information: [place] [address] [phone]            The results of significant  diagnostics from this hospitalization (including imaging, microbiology, ancillary and laboratory) are listed below for reference.    Significant Diagnostic Studies: Nm Gi Blood Loss  Result Date: 04/30/2017 CLINICAL DATA:  GI bleed. EXAM: NUCLEAR MEDICINE GASTROINTESTINAL BLEEDING SCAN TECHNIQUE: Sequential abdominal images were obtained following intravenous administration of Tc-20m labeled red blood cells. RADIOPHARMACEUTICALS:  25.3 mCi Tc-61m in-vitro labeled red cells. COMPARISON:  None. FINDINGS: No GI bleed identified. IMPRESSION: No GI bleed identified. Electronically Signed   By: Dorise Bullion III M.D   On: 04/30/2017 19:47   Ct Abdomen Pelvis W Contrast  Result Date: 04/29/2017 CLINICAL DATA:  Bright red blood in stool. Abdominal pain and distension. Gastric ulcer, dementia. Assess for colitis. EXAM: CT ABDOMEN AND PELVIS WITH CONTRAST TECHNIQUE: Multidetector CT imaging of the abdomen and pelvis was performed using the standard protocol following bolus administration of intravenous contrast. CONTRAST:  133mL ISOVUE-300 IOPAMIDOL (ISOVUE-300) INJECTION 61% COMPARISON:  CT abdomen and pelvis June 22, 2015 FINDINGS: LOWER CHEST: Dependent atelectasis and scarring. Heart size is normal. Annular calcifications. No pericardial effusion. HEPATOBILIARY: Stable 16 mm cyst in dome of the liver. Mild new intrahepatic biliary dilatation. Distended gallbladder with mild wall thickening. No CT findings of cholelithiasis. No extrahepatic biliary dilatation. PANCREAS: Normal. SPLEEN: Normal. ADRENALS/URINARY TRACT: Kidneys are orthotopic, demonstrating symmetric enhancement. 5 x 8 mm RIGHT and 6 x 10 mm LEFT renal pelvic calculi without hydronephrosis. Mild cortical atrophy bilaterally, RIGHT cortical scarring. Hydronephrosis or solid renal masses. The unopacified ureters are normal in course and caliber. Delayed imaging through the kidneys demonstrates symmetric prompt contrast excretion within the  proximal urinary collecting system. Urinary bladder is well distended and unremarkable. Normal adrenal glands. STOMACH/BOWEL:  Large hiatal hernia containing the majority of the stomach, superior aspect out of field-of-view. Stool distended rectum at 7.3 cm. Moderate descending and sigmoid colonic diverticulosis. Moderate amount of retained large bowel stool. The appendix is not discretely identified, however there are no inflammatory changes in the right lower quadrant. VASCULAR/LYMPHATIC: Aortoiliac vessels are normal in course and caliber. Moderate to severe calcific atherosclerosis carotid bifurcations. No lymphadenopathy by CT size criteria. REPRODUCTIVE: Normal. OTHER: No intraperitoneal free fluid or free air. MUSCULOSKELETAL: Nonacute. Small fat containing umbilical hernia. Comminuted healing LEFT inferior and superior pubic rami fractures. Healing bilateral sacroiliac fractures. Minimal grade 1 L4-5 anterolisthesis on degenerative basis. IMPRESSION: 1. Diverticulosis without acute diverticulitis. Moderate retained large bowel stool with stool distended rectum equivocal for fecal impaction. No bowel obstruction. 2. New mild intrahepatic biliary dilatation with distended gallbladder, possible acute or chronic cholecystitis. Recommend ultrasound. 3. Bilateral nonobstructing nephrolithiasis. 4. Large hiatal hernia. 5. Multiple healing pelvic fractures. Aortic Atherosclerosis (ICD10-I70.0). Electronically Signed   By: Elon Alas M.D.   On: 04/29/2017 22:52   US Abdomen Limited Ruq  Result Date: 04/30/2017 CLINICAL DATA:  RIGHT upper quadrant pain. Possible cholecystitis on today's CT abdomen and pelvis. EXAM: ULTRASOUND ABDOMEN LIMITED RIGHT UPPER QUADRANT COMPARISON:  CT abdomen and pelvis April 29, 2017 FINDINGS: Habitus limited examination, patient was unable to cooperate with imaging directions. Gallbladder: Mild gallbladder distension. Multiple echogenic sludge ball measuring to 16 mm with  acoustic enhancement. No pericholecystic fluid. Gallbladder wall thickening at 4 mm. No sonographic Murphy's sign elicited. Common bile duct: Diameter: 12 mm proximally, 6 mm distally.  No choledocholithiasis. Liver: No focal lesion identified. Within normal limits in parenchymal echogenicity. Portal vein is patent on color Doppler imaging with normal direction of blood flow towards the liver. IMPRESSION: 1. Gallbladder sludge and wall thickening without sonographic Murphy sign. Findings suggest chronic cholecystitis. Electronically Signed   By: Elon Alas M.D.   On: 04/30/2017 00:34    Microbiology: Recent Results (from the past 240 hour(s))  MRSA PCR Screening     Status: None   Collection Time: 04/30/17  4:57 PM  Result Value Ref Range Status   MRSA by PCR NEGATIVE NEGATIVE Final    Comment:        The GeneXpert MRSA Assay (FDA approved for NASAL specimens only), is one component of a comprehensive MRSA colonization surveillance program. It is not intended to diagnose MRSA infection nor to guide or monitor treatment for MRSA infections.      Labs: Basic Metabolic Panel:  Recent Labs Lab 04/29/17 1933 04/30/17 0600 05/01/17 0455  NA 141 139 145  K 3.9 3.8 3.2*  CL 104 107 111  CO2 29 24 24   GLUCOSE 91 109* 109*  BUN 19 16 20   CREATININE 1.09* 0.94 0.90  CALCIUM 9.6 8.8* 9.1  MG  --  1.9  --   PHOS  --  3.0  --    Liver Function Tests:  Recent Labs Lab 04/29/17 1933 04/30/17 0600  AST 249* 126*  ALT 390* 273*  ALKPHOS 602* 482*  BILITOT 0.7 0.9  PROT 6.6 5.3*  ALBUMIN 3.8 2.9*    Recent Labs Lab 04/29/17 1933  LIPASE 36   No results for input(s): AMMONIA in the last 168 hours. CBC:  Recent Labs Lab 04/29/17 1933 04/29/17 2350 04/30/17 0548 05/01/17 0455  WBC 9.8 9.9 9.0 9.3  HGB 13.2 12.1 11.6* 11.5*  HCT 41.1 37.0 36.3 35.0*  MCV 91.1 89.2 90.8 89.3  PLT 320 286  277 281   Cardiac Enzymes: No results for input(s): CKTOTAL, CKMB,  CKMBINDEX, TROPONINI in the last 168 hours. BNP: BNP (last 3 results) No results for input(s): BNP in the last 8760 hours.  ProBNP (last 3 results) No results for input(s): PROBNP in the last 8760 hours.  CBG: No results for input(s): GLUCAP in the last 168 hours.

## 2017-05-02 NOTE — NC FL2 (Signed)
[place] LEVEL OF CARE SCREENING TOOL     IDENTIFICATION  Patient Name: Jamie [name] Birthdate: 1929-11-26 Sex: female Admission Date (Current Location): 04/29/2017  [facility] and [place] Number:  Herbalist and Address:  [facility],  [place], [place]      Provider Number: [id number]  Attending Physician Name and Address:  Robbie Lis, MD  Relative Name and Phone Number:       Current Level of Care: Hospital Recommended Level of Care: [facility] (Memory care unit) Prior Approval Number:    Date Approved/Denied:   PASRR Number: [id number]  Discharge Plan: Other (Comment) ([facility])    Current Diagnoses: Patient Active Problem List   Diagnosis Date Noted  . Obstipation 04/30/2017  . Aortic stenosis 06/22/2015  . Hypotension 06/21/2015  . Chest pain 06/21/2015  . Anemia associated with acute blood loss   . AKI (acute kidney injury) ([place])   . Tachypnea   . Hypoxia 04/18/2015  . Acute respiratory failure with hypoxia and hypercapnia ([place]) 04/17/2015  . Acute respiratory failure with hypoxia ([place]) 04/17/2015  . HCAP (healthcare-associated pneumonia) 04/17/2015  . Acute-on-chronic kidney injury ([place]) 04/17/2015  . Anemia 04/17/2015  . Sepsis ([place]) 04/17/2015  . Cellulitis of leg, right 01/01/2015  . Acute blood loss anemia 01/01/2015  . Elevated troponin 01/01/2015  . Hypokalemia 01/01/2015  . Depression 01/01/2015  . GI bleed 12/27/2014  . Bleeding gastrointestinal   . Hemorrhagic shock ([place])   . Alcohol dependence with uncomplicated withdrawal ([place])   . Alcohol dependence ([place]) 09/30/2014  . Dementia with behavioral disturbance 09/29/2014  . Delusions ([place]) 09/29/2014  . Dementia 12/13/2013  . GI bleeding 08/12/2012  . Obesity   . Chronic pain   . Gastric ulcer 05/03/2011  . Bladder spasm 02/10/2011  . Murmur, cardiac 11/20/2010  . SHOULDER PAIN, RIGHT  04/15/2010  . LOC OSTEOARTHROS NOT SPEC PRIM/SEC LOWER LEG 04/10/2010  . CALF PAIN, RIGHT 03/01/2010  . CERVICAL STRAIN, ACUTE 02/25/2010  . ANXIETY STATE, UNSPECIFIED 06/20/2009  . SEBACEOUS CYST, INFECTED 06/20/2009  . ALLERGIC RHINITIS DUE TO OTHER ALLERGEN 11/17/2008  . TACHYCARDIA 05/16/2008  . CONTUSION OF BREAST 05/05/2008  . HYPERCALCEMIA 01/20/2008  . EPISTAXIS 12/30/2007  . VITAMIN B12 DEFICIENCY 11/23/2007  . APHTHOUS ULCERS 11/23/2007  . ASYMPTOMATIC POSTMENOPAUSAL STATUS 08/18/2007  . Essential hypertension 05/10/2007  . ADVEF, DRUG/MEDICINAL/BIOLOGICAL SUBST NOS 05/10/2007  . Hyperlipemia 02/23/2007    Orientation RESPIRATION BLADDER Height & Weight     Self  Normal Incontinent Weight: 141 lb 15.6 oz (64.4 kg) Height:  5\' 2"  (157.5 cm)  BEHAVIORAL SYMPTOMS/MOOD NEUROLOGICAL: oriented to person only BOWEL: Incontinent NUTRITION STATUS        Diet:  Low Sodium Heart Healthy  AMBULATORY STATUS:   COMMUNICATION OF NEEDS Skin   Extensive Assist Verbally Abrasions to R. Arm Moisture Associated Skin damage- Buttocks                         Personal Care Assistance Level of Assistance  Bathing, Feeding, Dressing Bathing Assistance: Maximum assistance Feeding: Some assistance Dressing Assistance: Maximum assistance     Functional Limitations Info             SPECIAL CARE FACTORS FREQUENCY  PT (By licensed PT)     PT Frequency: HHPT  Evaluate and treat in facility  Contractures      Additional Factors Info  Code Status, Allergies Code Status Info: DNR Allergies Info: Pollen Extract           Current Medications (05/02/2017):  This is the current hospital active medication list Current Facility-Administered Medications  Medication Dose Route Frequency Provider Last Rate Last Dose  . acetaminophen (TYLENOL) tablet 650 mg  650 mg Oral Q6H PRN Toy Baker, MD       Or  . acetaminophen (TYLENOL) suppository 650 mg  650 mg  Rectal Q6H PRN Doutova, Anastassia, MD      . ARIPiprazole (ABILIFY) tablet 5 mg  5 mg Oral QHS Doutova, Anastassia, MD   5 mg at 05/01/17 2106  . busPIRone (BUSPAR) tablet 7.5 mg  7.5 mg Oral BID Doutova, Anastassia, MD   7.5 mg at 05/02/17 1401  . gabapentin (NEURONTIN) capsule 100 mg  100 mg Oral QHS Doutova, Anastassia, MD   100 mg at 05/01/17 2106  . olopatadine (PATANOL) 0.1 % ophthalmic solution 1 drop  1 drop Both Eyes BID Toy Baker, MD   1 drop at 05/02/17 1412  . ondansetron (ZOFRAN) tablet 4 mg  4 mg Oral Q6H PRN Doutova, Anastassia, MD       Or  . ondansetron (ZOFRAN) injection 4 mg  4 mg Intravenous Q6H PRN Doutova, Anastassia, MD      . pantoprazole (PROTONIX) EC tablet 40 mg  40 mg Oral Daily Ronnette Juniper, MD   40 mg at 05/02/17 1402  . piperacillin-tazobactam (ZOSYN) IVPB 3.375 g  3.375 g Intravenous Q8H Doutova, Anastassia, MD 12.5 mL/hr at 05/02/17 1402 3.375 g at 05/02/17 1402  . polyethylene glycol (MIRALAX / GLYCOLAX) packet 17 g  17 g Oral BID Ronnette Juniper, MD   17 g at 05/02/17 1359  . predniSONE (DELTASONE) tablet 10 mg  10 mg Oral Q breakfast Robbie Lis, MD   10 mg at 05/02/17 1402  . sertraline (ZOLOFT) tablet 50 mg  50 mg Oral Daily Doutova, Anastassia, MD   50 mg at 05/02/17 1400  . traMADol (ULTRAM) tablet 50 mg  50 mg Oral Q8H PRN Toy Baker, MD   50 mg at 05/02/17 0305     Discharge Medications: STOP taking these medications           cephALEXin 250 MG capsule Commonly known as:  KEFLEX    fluticasone 50 MCG/ACT nasal spray Commonly known as:  FLONASE    oxyCODONE 5 MG immediate release tablet Commonly known as:  ROXICODONE    potassium chloride 10 MEQ tablet Commonly known as:  K-DUR    risperiDONE 0.5 MG tablet Commonly known as:  RISPERDAL    risperiDONE 1 MG tablet Commonly known as:  RISPERDAL                       TAKE these medications            acetaminophen 500 MG tablet Commonly known as:  TYLENOL Take  1,000 mg by mouth every 8 (eight) hours.    acetaminophen 325 MG suppository Commonly known as:  TYLENOL Place 650 mg rectally every 4 (four) hours as needed (oral temp over 101).    amoxicillin-clavulanate 875-125 MG tablet Commonly known as:  AUGMENTIN Take 1 tablet by mouth 2 (two) times daily.    ARIPiprazole 5 MG tablet Commonly known as:  ABILIFY Take 1 tablet (5 mg total) by mouth at bedtime.    busPIRone 7.5 MG  tablet Commonly known as:  BUSPAR Take 7.5 mg by mouth 2 (two) times daily.    calcium carbonate 1250 (500 Ca) MG tablet Commonly known as:  OS-CAL - dosed in mg of elemental calcium Take 1 tablet by mouth 2 (two) times daily with a meal.    furosemide 40 MG tablet Commonly known as:  LASIX Take 1 tablet (40 mg total) by mouth daily.    gabapentin 100 MG capsule Commonly known as:  NEURONTIN Take 1 capsule (100 mg total) by mouth at bedtime.    Hypromellose 0.5 % Soln Commonly known as:  ISOPTO TEARS Apply 1 drop to eye 3 (three) times daily. One drop in each eye, three times daily. What changed:  how to take this  additional instructions    magnesium oxide 400 MG tablet Commonly known as:  MAG-OX Take 400 mg by mouth daily.    Melatonin 1 MG Tabs Take 1 mg by mouth at bedtime.    olopatadine 0.1 % ophthalmic solution Commonly known as:  PATANOL Place 1 drop into both eyes 2 (two) times daily.    pantoprazole 40 MG tablet Commonly known as:  PROTONIX Take 1 tablet (40 mg total) by mouth 2 (two) times daily before a meal. What changed:  when to take this    polyethylene glycol packet Commonly known as:  MIRALAX / GLYCOLAX Take 17 g by mouth daily.    potassium chloride SA 20 MEQ tablet Commonly known as:  K-DUR,KLOR-CON Take 60 mEq by mouth daily.    predniSONE 5 MG tablet Commonly known as:  DELTASONE Take 1 tablet (5 mg total) by mouth daily with breakfast. What changed:  how much to take    senna 8.6 MG Tabs  tablet Commonly known as:  SENOKOT Take 1 tablet (8.6 mg total) by mouth at bedtime.    senna-docusate 8.6-50 MG tablet Commonly known as:  Senokot-S Take 1 tablet by mouth 2 (two) times daily as needed for mild constipation.    sertraline 50 MG tablet Commonly known as:  ZOLOFT Take 1 tablet (50 mg total) by mouth daily.    traMADol 50 MG tablet Commonly known as:  ULTRAM Take 1 tablet (50 mg total) by mouth every 8 (eight) hours as needed (for pain).                  Relevant Imaging Results:  Relevant Lab Results:   Additional Information SSN [id number]  Williemae Area, [place]

## 2017-05-05 DIAGNOSIS — K5790 Diverticulosis of intestine, part unspecified, without perforation or abscess without bleeding: Secondary | ICD-10-CM | POA: Diagnosis not present

## 2017-05-05 DIAGNOSIS — E876 Hypokalemia: Secondary | ICD-10-CM | POA: Diagnosis not present

## 2017-05-05 DIAGNOSIS — K5641 Fecal impaction: Secondary | ICD-10-CM | POA: Diagnosis not present

## 2017-05-05 DIAGNOSIS — G308 Other Alzheimer's disease: Secondary | ICD-10-CM | POA: Diagnosis not present

## 2017-05-06 DIAGNOSIS — I5022 Chronic systolic (congestive) heart failure: Secondary | ICD-10-CM | POA: Diagnosis not present

## 2017-05-06 DIAGNOSIS — I110 Hypertensive heart disease with heart failure: Secondary | ICD-10-CM | POA: Diagnosis not present

## 2017-05-06 DIAGNOSIS — S329XXD Fracture of unspecified parts of lumbosacral spine and pelvis, subsequent encounter for fracture with routine healing: Secondary | ICD-10-CM | POA: Diagnosis not present

## 2017-05-08 DIAGNOSIS — S329XXD Fracture of unspecified parts of lumbosacral spine and pelvis, subsequent encounter for fracture with routine healing: Secondary | ICD-10-CM | POA: Diagnosis not present

## 2017-05-08 DIAGNOSIS — I110 Hypertensive heart disease with heart failure: Secondary | ICD-10-CM | POA: Diagnosis not present

## 2017-05-08 DIAGNOSIS — I5022 Chronic systolic (congestive) heart failure: Secondary | ICD-10-CM | POA: Diagnosis not present

## 2017-05-11 DIAGNOSIS — I5022 Chronic systolic (congestive) heart failure: Secondary | ICD-10-CM | POA: Diagnosis not present

## 2017-05-11 DIAGNOSIS — I110 Hypertensive heart disease with heart failure: Secondary | ICD-10-CM | POA: Diagnosis not present

## 2017-05-11 DIAGNOSIS — S329XXD Fracture of unspecified parts of lumbosacral spine and pelvis, subsequent encounter for fracture with routine healing: Secondary | ICD-10-CM | POA: Diagnosis not present

## 2017-05-12 DIAGNOSIS — K5641 Fecal impaction: Secondary | ICD-10-CM | POA: Diagnosis not present

## 2017-05-12 DIAGNOSIS — I5022 Chronic systolic (congestive) heart failure: Secondary | ICD-10-CM | POA: Diagnosis not present

## 2017-05-12 DIAGNOSIS — G308 Other Alzheimer's disease: Secondary | ICD-10-CM | POA: Diagnosis not present

## 2017-05-12 DIAGNOSIS — K922 Gastrointestinal hemorrhage, unspecified: Secondary | ICD-10-CM | POA: Diagnosis not present

## 2017-05-12 DIAGNOSIS — I110 Hypertensive heart disease with heart failure: Secondary | ICD-10-CM | POA: Diagnosis not present

## 2017-05-12 DIAGNOSIS — S329XXD Fracture of unspecified parts of lumbosacral spine and pelvis, subsequent encounter for fracture with routine healing: Secondary | ICD-10-CM | POA: Diagnosis not present

## 2017-05-12 DIAGNOSIS — K5790 Diverticulosis of intestine, part unspecified, without perforation or abscess without bleeding: Secondary | ICD-10-CM | POA: Diagnosis not present

## 2017-05-14 DIAGNOSIS — I5022 Chronic systolic (congestive) heart failure: Secondary | ICD-10-CM | POA: Diagnosis not present

## 2017-05-14 DIAGNOSIS — S329XXD Fracture of unspecified parts of lumbosacral spine and pelvis, subsequent encounter for fracture with routine healing: Secondary | ICD-10-CM | POA: Diagnosis not present

## 2017-05-14 DIAGNOSIS — I110 Hypertensive heart disease with heart failure: Secondary | ICD-10-CM | POA: Diagnosis not present

## 2017-05-18 DIAGNOSIS — S329XXD Fracture of unspecified parts of lumbosacral spine and pelvis, subsequent encounter for fracture with routine healing: Secondary | ICD-10-CM | POA: Diagnosis not present

## 2017-05-18 DIAGNOSIS — I110 Hypertensive heart disease with heart failure: Secondary | ICD-10-CM | POA: Diagnosis not present

## 2017-05-18 DIAGNOSIS — I5022 Chronic systolic (congestive) heart failure: Secondary | ICD-10-CM | POA: Diagnosis not present

## 2017-05-19 DIAGNOSIS — K625 Hemorrhage of anus and rectum: Secondary | ICD-10-CM | POA: Diagnosis not present

## 2017-05-19 DIAGNOSIS — R945 Abnormal results of liver function studies: Secondary | ICD-10-CM | POA: Diagnosis not present

## 2017-05-20 DIAGNOSIS — S329XXD Fracture of unspecified parts of lumbosacral spine and pelvis, subsequent encounter for fracture with routine healing: Secondary | ICD-10-CM | POA: Diagnosis not present

## 2017-05-20 DIAGNOSIS — I110 Hypertensive heart disease with heart failure: Secondary | ICD-10-CM | POA: Diagnosis not present

## 2017-05-20 DIAGNOSIS — I5022 Chronic systolic (congestive) heart failure: Secondary | ICD-10-CM | POA: Diagnosis not present

## 2017-05-21 DIAGNOSIS — I5022 Chronic systolic (congestive) heart failure: Secondary | ICD-10-CM | POA: Diagnosis not present

## 2017-05-21 DIAGNOSIS — S329XXD Fracture of unspecified parts of lumbosacral spine and pelvis, subsequent encounter for fracture with routine healing: Secondary | ICD-10-CM | POA: Diagnosis not present

## 2017-05-21 DIAGNOSIS — I110 Hypertensive heart disease with heart failure: Secondary | ICD-10-CM | POA: Diagnosis not present

## 2017-05-22 DIAGNOSIS — S329XXD Fracture of unspecified parts of lumbosacral spine and pelvis, subsequent encounter for fracture with routine healing: Secondary | ICD-10-CM | POA: Diagnosis not present

## 2017-05-22 DIAGNOSIS — I5022 Chronic systolic (congestive) heart failure: Secondary | ICD-10-CM | POA: Diagnosis not present

## 2017-05-22 DIAGNOSIS — I110 Hypertensive heart disease with heart failure: Secondary | ICD-10-CM | POA: Diagnosis not present

## 2017-05-25 DIAGNOSIS — I110 Hypertensive heart disease with heart failure: Secondary | ICD-10-CM | POA: Diagnosis not present

## 2017-05-25 DIAGNOSIS — S329XXD Fracture of unspecified parts of lumbosacral spine and pelvis, subsequent encounter for fracture with routine healing: Secondary | ICD-10-CM | POA: Diagnosis not present

## 2017-05-25 DIAGNOSIS — I5022 Chronic systolic (congestive) heart failure: Secondary | ICD-10-CM | POA: Diagnosis not present

## 2017-05-26 DIAGNOSIS — I5022 Chronic systolic (congestive) heart failure: Secondary | ICD-10-CM | POA: Diagnosis not present

## 2017-05-26 DIAGNOSIS — I110 Hypertensive heart disease with heart failure: Secondary | ICD-10-CM | POA: Diagnosis not present

## 2017-05-26 DIAGNOSIS — S329XXD Fracture of unspecified parts of lumbosacral spine and pelvis, subsequent encounter for fracture with routine healing: Secondary | ICD-10-CM | POA: Diagnosis not present

## 2017-05-28 DIAGNOSIS — I110 Hypertensive heart disease with heart failure: Secondary | ICD-10-CM | POA: Diagnosis not present

## 2017-05-28 DIAGNOSIS — S329XXD Fracture of unspecified parts of lumbosacral spine and pelvis, subsequent encounter for fracture with routine healing: Secondary | ICD-10-CM | POA: Diagnosis not present

## 2017-05-28 DIAGNOSIS — I5022 Chronic systolic (congestive) heart failure: Secondary | ICD-10-CM | POA: Diagnosis not present

## 2017-05-29 DIAGNOSIS — R739 Hyperglycemia, unspecified: Secondary | ICD-10-CM | POA: Diagnosis not present

## 2017-05-29 DIAGNOSIS — E876 Hypokalemia: Secondary | ICD-10-CM | POA: Diagnosis not present

## 2017-05-29 DIAGNOSIS — G4709 Other insomnia: Secondary | ICD-10-CM | POA: Diagnosis not present

## 2017-06-01 DIAGNOSIS — I110 Hypertensive heart disease with heart failure: Secondary | ICD-10-CM | POA: Diagnosis not present

## 2017-06-01 DIAGNOSIS — I5022 Chronic systolic (congestive) heart failure: Secondary | ICD-10-CM | POA: Diagnosis not present

## 2017-06-01 DIAGNOSIS — S329XXD Fracture of unspecified parts of lumbosacral spine and pelvis, subsequent encounter for fracture with routine healing: Secondary | ICD-10-CM | POA: Diagnosis not present

## 2017-06-02 DIAGNOSIS — S329XXD Fracture of unspecified parts of lumbosacral spine and pelvis, subsequent encounter for fracture with routine healing: Secondary | ICD-10-CM | POA: Diagnosis not present

## 2017-06-02 DIAGNOSIS — I5022 Chronic systolic (congestive) heart failure: Secondary | ICD-10-CM | POA: Diagnosis not present

## 2017-06-02 DIAGNOSIS — I110 Hypertensive heart disease with heart failure: Secondary | ICD-10-CM | POA: Diagnosis not present

## 2017-06-04 DIAGNOSIS — S329XXD Fracture of unspecified parts of lumbosacral spine and pelvis, subsequent encounter for fracture with routine healing: Secondary | ICD-10-CM | POA: Diagnosis not present

## 2017-06-04 DIAGNOSIS — I110 Hypertensive heart disease with heart failure: Secondary | ICD-10-CM | POA: Diagnosis not present

## 2017-06-04 DIAGNOSIS — I5022 Chronic systolic (congestive) heart failure: Secondary | ICD-10-CM | POA: Diagnosis not present

## 2017-06-05 DIAGNOSIS — I110 Hypertensive heart disease with heart failure: Secondary | ICD-10-CM | POA: Diagnosis not present

## 2017-06-05 DIAGNOSIS — S329XXD Fracture of unspecified parts of lumbosacral spine and pelvis, subsequent encounter for fracture with routine healing: Secondary | ICD-10-CM | POA: Diagnosis not present

## 2017-06-05 DIAGNOSIS — I5022 Chronic systolic (congestive) heart failure: Secondary | ICD-10-CM | POA: Diagnosis not present

## 2017-06-08 DIAGNOSIS — B351 Tinea unguium: Secondary | ICD-10-CM | POA: Diagnosis not present

## 2017-06-08 DIAGNOSIS — S329XXD Fracture of unspecified parts of lumbosacral spine and pelvis, subsequent encounter for fracture with routine healing: Secondary | ICD-10-CM | POA: Diagnosis not present

## 2017-06-08 DIAGNOSIS — M79674 Pain in right toe(s): Secondary | ICD-10-CM | POA: Diagnosis not present

## 2017-06-08 DIAGNOSIS — I5022 Chronic systolic (congestive) heart failure: Secondary | ICD-10-CM | POA: Diagnosis not present

## 2017-06-08 DIAGNOSIS — I110 Hypertensive heart disease with heart failure: Secondary | ICD-10-CM | POA: Diagnosis not present

## 2017-06-09 DIAGNOSIS — I5022 Chronic systolic (congestive) heart failure: Secondary | ICD-10-CM | POA: Diagnosis not present

## 2017-06-09 DIAGNOSIS — I110 Hypertensive heart disease with heart failure: Secondary | ICD-10-CM | POA: Diagnosis not present

## 2017-06-09 DIAGNOSIS — Z79899 Other long term (current) drug therapy: Secondary | ICD-10-CM | POA: Diagnosis not present

## 2017-06-09 DIAGNOSIS — S329XXD Fracture of unspecified parts of lumbosacral spine and pelvis, subsequent encounter for fracture with routine healing: Secondary | ICD-10-CM | POA: Diagnosis not present

## 2017-06-10 DIAGNOSIS — I110 Hypertensive heart disease with heart failure: Secondary | ICD-10-CM | POA: Diagnosis not present

## 2017-06-10 DIAGNOSIS — I5022 Chronic systolic (congestive) heart failure: Secondary | ICD-10-CM | POA: Diagnosis not present

## 2017-06-10 DIAGNOSIS — S329XXD Fracture of unspecified parts of lumbosacral spine and pelvis, subsequent encounter for fracture with routine healing: Secondary | ICD-10-CM | POA: Diagnosis not present

## 2017-06-12 DIAGNOSIS — S329XXD Fracture of unspecified parts of lumbosacral spine and pelvis, subsequent encounter for fracture with routine healing: Secondary | ICD-10-CM | POA: Diagnosis not present

## 2017-06-12 DIAGNOSIS — I110 Hypertensive heart disease with heart failure: Secondary | ICD-10-CM | POA: Diagnosis not present

## 2017-06-12 DIAGNOSIS — I5022 Chronic systolic (congestive) heart failure: Secondary | ICD-10-CM | POA: Diagnosis not present

## 2017-06-14 DIAGNOSIS — S329XXD Fracture of unspecified parts of lumbosacral spine and pelvis, subsequent encounter for fracture with routine healing: Secondary | ICD-10-CM | POA: Diagnosis not present

## 2017-06-14 DIAGNOSIS — I700 Atherosclerosis of aorta: Secondary | ICD-10-CM | POA: Diagnosis not present

## 2017-06-14 DIAGNOSIS — K5790 Diverticulosis of intestine, part unspecified, without perforation or abscess without bleeding: Secondary | ICD-10-CM | POA: Diagnosis not present

## 2017-06-14 DIAGNOSIS — Z9181 History of falling: Secondary | ICD-10-CM | POA: Diagnosis not present

## 2017-06-14 DIAGNOSIS — I350 Nonrheumatic aortic (valve) stenosis: Secondary | ICD-10-CM | POA: Diagnosis not present

## 2017-06-14 DIAGNOSIS — Z87440 Personal history of urinary (tract) infections: Secondary | ICD-10-CM | POA: Diagnosis not present

## 2017-06-14 DIAGNOSIS — I5022 Chronic systolic (congestive) heart failure: Secondary | ICD-10-CM | POA: Diagnosis not present

## 2017-06-14 DIAGNOSIS — I110 Hypertensive heart disease with heart failure: Secondary | ICD-10-CM | POA: Diagnosis not present

## 2017-06-15 DIAGNOSIS — I350 Nonrheumatic aortic (valve) stenosis: Secondary | ICD-10-CM | POA: Diagnosis not present

## 2017-06-15 DIAGNOSIS — Z87440 Personal history of urinary (tract) infections: Secondary | ICD-10-CM | POA: Diagnosis not present

## 2017-06-15 DIAGNOSIS — I110 Hypertensive heart disease with heart failure: Secondary | ICD-10-CM | POA: Diagnosis not present

## 2017-06-15 DIAGNOSIS — I5022 Chronic systolic (congestive) heart failure: Secondary | ICD-10-CM | POA: Diagnosis not present

## 2017-06-15 DIAGNOSIS — Z9181 History of falling: Secondary | ICD-10-CM | POA: Diagnosis not present

## 2017-06-15 DIAGNOSIS — K5790 Diverticulosis of intestine, part unspecified, without perforation or abscess without bleeding: Secondary | ICD-10-CM | POA: Diagnosis not present

## 2017-06-15 DIAGNOSIS — S329XXD Fracture of unspecified parts of lumbosacral spine and pelvis, subsequent encounter for fracture with routine healing: Secondary | ICD-10-CM | POA: Diagnosis not present

## 2017-06-15 DIAGNOSIS — I700 Atherosclerosis of aorta: Secondary | ICD-10-CM | POA: Diagnosis not present

## 2017-06-16 DIAGNOSIS — S329XXD Fracture of unspecified parts of lumbosacral spine and pelvis, subsequent encounter for fracture with routine healing: Secondary | ICD-10-CM | POA: Diagnosis not present

## 2017-06-16 DIAGNOSIS — I110 Hypertensive heart disease with heart failure: Secondary | ICD-10-CM | POA: Diagnosis not present

## 2017-06-18 DIAGNOSIS — I110 Hypertensive heart disease with heart failure: Secondary | ICD-10-CM | POA: Diagnosis not present

## 2017-06-18 DIAGNOSIS — I700 Atherosclerosis of aorta: Secondary | ICD-10-CM | POA: Diagnosis not present

## 2017-06-18 DIAGNOSIS — Z87440 Personal history of urinary (tract) infections: Secondary | ICD-10-CM | POA: Diagnosis not present

## 2017-06-18 DIAGNOSIS — Z9181 History of falling: Secondary | ICD-10-CM | POA: Diagnosis not present

## 2017-06-18 DIAGNOSIS — S329XXD Fracture of unspecified parts of lumbosacral spine and pelvis, subsequent encounter for fracture with routine healing: Secondary | ICD-10-CM | POA: Diagnosis not present

## 2017-06-18 DIAGNOSIS — I5022 Chronic systolic (congestive) heart failure: Secondary | ICD-10-CM | POA: Diagnosis not present

## 2017-06-18 DIAGNOSIS — K5790 Diverticulosis of intestine, part unspecified, without perforation or abscess without bleeding: Secondary | ICD-10-CM | POA: Diagnosis not present

## 2017-06-18 DIAGNOSIS — I350 Nonrheumatic aortic (valve) stenosis: Secondary | ICD-10-CM | POA: Diagnosis not present

## 2017-06-19 DIAGNOSIS — I5022 Chronic systolic (congestive) heart failure: Secondary | ICD-10-CM | POA: Diagnosis not present

## 2017-06-19 DIAGNOSIS — I350 Nonrheumatic aortic (valve) stenosis: Secondary | ICD-10-CM | POA: Diagnosis not present

## 2017-06-19 DIAGNOSIS — Z9181 History of falling: Secondary | ICD-10-CM | POA: Diagnosis not present

## 2017-06-19 DIAGNOSIS — I110 Hypertensive heart disease with heart failure: Secondary | ICD-10-CM | POA: Diagnosis not present

## 2017-06-19 DIAGNOSIS — S329XXD Fracture of unspecified parts of lumbosacral spine and pelvis, subsequent encounter for fracture with routine healing: Secondary | ICD-10-CM | POA: Diagnosis not present

## 2017-06-19 DIAGNOSIS — Z87440 Personal history of urinary (tract) infections: Secondary | ICD-10-CM | POA: Diagnosis not present

## 2017-06-19 DIAGNOSIS — K5790 Diverticulosis of intestine, part unspecified, without perforation or abscess without bleeding: Secondary | ICD-10-CM | POA: Diagnosis not present

## 2017-06-19 DIAGNOSIS — I700 Atherosclerosis of aorta: Secondary | ICD-10-CM | POA: Diagnosis not present

## 2017-06-22 DIAGNOSIS — Z9181 History of falling: Secondary | ICD-10-CM | POA: Diagnosis not present

## 2017-06-22 DIAGNOSIS — I5022 Chronic systolic (congestive) heart failure: Secondary | ICD-10-CM | POA: Diagnosis not present

## 2017-06-22 DIAGNOSIS — I110 Hypertensive heart disease with heart failure: Secondary | ICD-10-CM | POA: Diagnosis not present

## 2017-06-22 DIAGNOSIS — S329XXD Fracture of unspecified parts of lumbosacral spine and pelvis, subsequent encounter for fracture with routine healing: Secondary | ICD-10-CM | POA: Diagnosis not present

## 2017-06-22 DIAGNOSIS — Z87440 Personal history of urinary (tract) infections: Secondary | ICD-10-CM | POA: Diagnosis not present

## 2017-06-22 DIAGNOSIS — I700 Atherosclerosis of aorta: Secondary | ICD-10-CM | POA: Diagnosis not present

## 2017-06-22 DIAGNOSIS — I350 Nonrheumatic aortic (valve) stenosis: Secondary | ICD-10-CM | POA: Diagnosis not present

## 2017-06-22 DIAGNOSIS — K5790 Diverticulosis of intestine, part unspecified, without perforation or abscess without bleeding: Secondary | ICD-10-CM | POA: Diagnosis not present

## 2017-06-23 DIAGNOSIS — I110 Hypertensive heart disease with heart failure: Secondary | ICD-10-CM | POA: Diagnosis not present

## 2017-06-23 DIAGNOSIS — I5022 Chronic systolic (congestive) heart failure: Secondary | ICD-10-CM | POA: Diagnosis not present

## 2017-06-23 DIAGNOSIS — Z9181 History of falling: Secondary | ICD-10-CM | POA: Diagnosis not present

## 2017-06-23 DIAGNOSIS — I350 Nonrheumatic aortic (valve) stenosis: Secondary | ICD-10-CM | POA: Diagnosis not present

## 2017-06-23 DIAGNOSIS — S329XXD Fracture of unspecified parts of lumbosacral spine and pelvis, subsequent encounter for fracture with routine healing: Secondary | ICD-10-CM | POA: Diagnosis not present

## 2017-06-23 DIAGNOSIS — K5790 Diverticulosis of intestine, part unspecified, without perforation or abscess without bleeding: Secondary | ICD-10-CM | POA: Diagnosis not present

## 2017-06-23 DIAGNOSIS — I700 Atherosclerosis of aorta: Secondary | ICD-10-CM | POA: Diagnosis not present

## 2017-06-23 DIAGNOSIS — Z87440 Personal history of urinary (tract) infections: Secondary | ICD-10-CM | POA: Diagnosis not present

## 2017-06-24 DIAGNOSIS — I350 Nonrheumatic aortic (valve) stenosis: Secondary | ICD-10-CM | POA: Diagnosis not present

## 2017-06-24 DIAGNOSIS — I5022 Chronic systolic (congestive) heart failure: Secondary | ICD-10-CM | POA: Diagnosis not present

## 2017-06-24 DIAGNOSIS — K5790 Diverticulosis of intestine, part unspecified, without perforation or abscess without bleeding: Secondary | ICD-10-CM | POA: Diagnosis not present

## 2017-06-24 DIAGNOSIS — Z9181 History of falling: Secondary | ICD-10-CM | POA: Diagnosis not present

## 2017-06-24 DIAGNOSIS — Z87440 Personal history of urinary (tract) infections: Secondary | ICD-10-CM | POA: Diagnosis not present

## 2017-06-24 DIAGNOSIS — I110 Hypertensive heart disease with heart failure: Secondary | ICD-10-CM | POA: Diagnosis not present

## 2017-06-24 DIAGNOSIS — I700 Atherosclerosis of aorta: Secondary | ICD-10-CM | POA: Diagnosis not present

## 2017-06-24 DIAGNOSIS — S329XXD Fracture of unspecified parts of lumbosacral spine and pelvis, subsequent encounter for fracture with routine healing: Secondary | ICD-10-CM | POA: Diagnosis not present

## 2017-06-25 DIAGNOSIS — I110 Hypertensive heart disease with heart failure: Secondary | ICD-10-CM | POA: Diagnosis not present

## 2017-06-25 DIAGNOSIS — I350 Nonrheumatic aortic (valve) stenosis: Secondary | ICD-10-CM | POA: Diagnosis not present

## 2017-06-25 DIAGNOSIS — Z23 Encounter for immunization: Secondary | ICD-10-CM | POA: Diagnosis not present

## 2017-06-25 DIAGNOSIS — I5022 Chronic systolic (congestive) heart failure: Secondary | ICD-10-CM | POA: Diagnosis not present

## 2017-06-25 DIAGNOSIS — Z9181 History of falling: Secondary | ICD-10-CM | POA: Diagnosis not present

## 2017-06-25 DIAGNOSIS — K5790 Diverticulosis of intestine, part unspecified, without perforation or abscess without bleeding: Secondary | ICD-10-CM | POA: Diagnosis not present

## 2017-06-25 DIAGNOSIS — Z87440 Personal history of urinary (tract) infections: Secondary | ICD-10-CM | POA: Diagnosis not present

## 2017-06-25 DIAGNOSIS — I700 Atherosclerosis of aorta: Secondary | ICD-10-CM | POA: Diagnosis not present

## 2017-06-25 DIAGNOSIS — S329XXD Fracture of unspecified parts of lumbosacral spine and pelvis, subsequent encounter for fracture with routine healing: Secondary | ICD-10-CM | POA: Diagnosis not present

## 2017-06-29 DIAGNOSIS — I110 Hypertensive heart disease with heart failure: Secondary | ICD-10-CM | POA: Diagnosis not present

## 2017-06-29 DIAGNOSIS — K5790 Diverticulosis of intestine, part unspecified, without perforation or abscess without bleeding: Secondary | ICD-10-CM | POA: Diagnosis not present

## 2017-06-29 DIAGNOSIS — I350 Nonrheumatic aortic (valve) stenosis: Secondary | ICD-10-CM | POA: Diagnosis not present

## 2017-06-29 DIAGNOSIS — Z87440 Personal history of urinary (tract) infections: Secondary | ICD-10-CM | POA: Diagnosis not present

## 2017-06-29 DIAGNOSIS — I700 Atherosclerosis of aorta: Secondary | ICD-10-CM | POA: Diagnosis not present

## 2017-06-29 DIAGNOSIS — I5022 Chronic systolic (congestive) heart failure: Secondary | ICD-10-CM | POA: Diagnosis not present

## 2017-06-29 DIAGNOSIS — Z9181 History of falling: Secondary | ICD-10-CM | POA: Diagnosis not present

## 2017-06-29 DIAGNOSIS — S329XXD Fracture of unspecified parts of lumbosacral spine and pelvis, subsequent encounter for fracture with routine healing: Secondary | ICD-10-CM | POA: Diagnosis not present

## 2017-07-01 DIAGNOSIS — I350 Nonrheumatic aortic (valve) stenosis: Secondary | ICD-10-CM | POA: Diagnosis not present

## 2017-07-01 DIAGNOSIS — Z9181 History of falling: Secondary | ICD-10-CM | POA: Diagnosis not present

## 2017-07-01 DIAGNOSIS — K5790 Diverticulosis of intestine, part unspecified, without perforation or abscess without bleeding: Secondary | ICD-10-CM | POA: Diagnosis not present

## 2017-07-01 DIAGNOSIS — I110 Hypertensive heart disease with heart failure: Secondary | ICD-10-CM | POA: Diagnosis not present

## 2017-07-01 DIAGNOSIS — I5022 Chronic systolic (congestive) heart failure: Secondary | ICD-10-CM | POA: Diagnosis not present

## 2017-07-01 DIAGNOSIS — S329XXD Fracture of unspecified parts of lumbosacral spine and pelvis, subsequent encounter for fracture with routine healing: Secondary | ICD-10-CM | POA: Diagnosis not present

## 2017-07-01 DIAGNOSIS — I700 Atherosclerosis of aorta: Secondary | ICD-10-CM | POA: Diagnosis not present

## 2017-07-01 DIAGNOSIS — Z87440 Personal history of urinary (tract) infections: Secondary | ICD-10-CM | POA: Diagnosis not present

## 2017-07-06 DIAGNOSIS — S329XXD Fracture of unspecified parts of lumbosacral spine and pelvis, subsequent encounter for fracture with routine healing: Secondary | ICD-10-CM | POA: Diagnosis not present

## 2017-07-06 DIAGNOSIS — Z9181 History of falling: Secondary | ICD-10-CM | POA: Diagnosis not present

## 2017-07-06 DIAGNOSIS — I350 Nonrheumatic aortic (valve) stenosis: Secondary | ICD-10-CM | POA: Diagnosis not present

## 2017-07-06 DIAGNOSIS — Z87440 Personal history of urinary (tract) infections: Secondary | ICD-10-CM | POA: Diagnosis not present

## 2017-07-06 DIAGNOSIS — K5790 Diverticulosis of intestine, part unspecified, without perforation or abscess without bleeding: Secondary | ICD-10-CM | POA: Diagnosis not present

## 2017-07-06 DIAGNOSIS — I5022 Chronic systolic (congestive) heart failure: Secondary | ICD-10-CM | POA: Diagnosis not present

## 2017-07-06 DIAGNOSIS — I110 Hypertensive heart disease with heart failure: Secondary | ICD-10-CM | POA: Diagnosis not present

## 2017-07-06 DIAGNOSIS — I700 Atherosclerosis of aorta: Secondary | ICD-10-CM | POA: Diagnosis not present

## 2017-07-07 DIAGNOSIS — I5022 Chronic systolic (congestive) heart failure: Secondary | ICD-10-CM | POA: Diagnosis not present

## 2017-07-07 DIAGNOSIS — I350 Nonrheumatic aortic (valve) stenosis: Secondary | ICD-10-CM | POA: Diagnosis not present

## 2017-07-07 DIAGNOSIS — S329XXD Fracture of unspecified parts of lumbosacral spine and pelvis, subsequent encounter for fracture with routine healing: Secondary | ICD-10-CM | POA: Diagnosis not present

## 2017-07-07 DIAGNOSIS — I700 Atherosclerosis of aorta: Secondary | ICD-10-CM | POA: Diagnosis not present

## 2017-07-07 DIAGNOSIS — I110 Hypertensive heart disease with heart failure: Secondary | ICD-10-CM | POA: Diagnosis not present

## 2017-07-07 DIAGNOSIS — Z9181 History of falling: Secondary | ICD-10-CM | POA: Diagnosis not present

## 2017-07-07 DIAGNOSIS — Z87440 Personal history of urinary (tract) infections: Secondary | ICD-10-CM | POA: Diagnosis not present

## 2017-07-07 DIAGNOSIS — K5790 Diverticulosis of intestine, part unspecified, without perforation or abscess without bleeding: Secondary | ICD-10-CM | POA: Diagnosis not present

## 2017-07-08 DIAGNOSIS — S329XXD Fracture of unspecified parts of lumbosacral spine and pelvis, subsequent encounter for fracture with routine healing: Secondary | ICD-10-CM | POA: Diagnosis not present

## 2017-07-08 DIAGNOSIS — I700 Atherosclerosis of aorta: Secondary | ICD-10-CM | POA: Diagnosis not present

## 2017-07-08 DIAGNOSIS — I350 Nonrheumatic aortic (valve) stenosis: Secondary | ICD-10-CM | POA: Diagnosis not present

## 2017-07-08 DIAGNOSIS — Z9181 History of falling: Secondary | ICD-10-CM | POA: Diagnosis not present

## 2017-07-08 DIAGNOSIS — Z87440 Personal history of urinary (tract) infections: Secondary | ICD-10-CM | POA: Diagnosis not present

## 2017-07-08 DIAGNOSIS — I5022 Chronic systolic (congestive) heart failure: Secondary | ICD-10-CM | POA: Diagnosis not present

## 2017-07-08 DIAGNOSIS — I110 Hypertensive heart disease with heart failure: Secondary | ICD-10-CM | POA: Diagnosis not present

## 2017-07-08 DIAGNOSIS — K5790 Diverticulosis of intestine, part unspecified, without perforation or abscess without bleeding: Secondary | ICD-10-CM | POA: Diagnosis not present

## 2017-07-10 DIAGNOSIS — I110 Hypertensive heart disease with heart failure: Secondary | ICD-10-CM | POA: Diagnosis not present

## 2017-07-10 DIAGNOSIS — I5022 Chronic systolic (congestive) heart failure: Secondary | ICD-10-CM | POA: Diagnosis not present

## 2017-07-10 DIAGNOSIS — Z87440 Personal history of urinary (tract) infections: Secondary | ICD-10-CM | POA: Diagnosis not present

## 2017-07-10 DIAGNOSIS — I350 Nonrheumatic aortic (valve) stenosis: Secondary | ICD-10-CM | POA: Diagnosis not present

## 2017-07-10 DIAGNOSIS — K5790 Diverticulosis of intestine, part unspecified, without perforation or abscess without bleeding: Secondary | ICD-10-CM | POA: Diagnosis not present

## 2017-07-10 DIAGNOSIS — Z9181 History of falling: Secondary | ICD-10-CM | POA: Diagnosis not present

## 2017-07-10 DIAGNOSIS — S329XXD Fracture of unspecified parts of lumbosacral spine and pelvis, subsequent encounter for fracture with routine healing: Secondary | ICD-10-CM | POA: Diagnosis not present

## 2017-07-10 DIAGNOSIS — I700 Atherosclerosis of aorta: Secondary | ICD-10-CM | POA: Diagnosis not present

## 2017-07-13 DIAGNOSIS — I110 Hypertensive heart disease with heart failure: Secondary | ICD-10-CM | POA: Diagnosis not present

## 2017-07-13 DIAGNOSIS — S329XXD Fracture of unspecified parts of lumbosacral spine and pelvis, subsequent encounter for fracture with routine healing: Secondary | ICD-10-CM | POA: Diagnosis not present

## 2017-07-13 DIAGNOSIS — I350 Nonrheumatic aortic (valve) stenosis: Secondary | ICD-10-CM | POA: Diagnosis not present

## 2017-07-13 DIAGNOSIS — I5022 Chronic systolic (congestive) heart failure: Secondary | ICD-10-CM | POA: Diagnosis not present

## 2017-07-13 DIAGNOSIS — K5790 Diverticulosis of intestine, part unspecified, without perforation or abscess without bleeding: Secondary | ICD-10-CM | POA: Diagnosis not present

## 2017-07-13 DIAGNOSIS — I700 Atherosclerosis of aorta: Secondary | ICD-10-CM | POA: Diagnosis not present

## 2017-07-13 DIAGNOSIS — Z9181 History of falling: Secondary | ICD-10-CM | POA: Diagnosis not present

## 2017-07-13 DIAGNOSIS — Z87440 Personal history of urinary (tract) infections: Secondary | ICD-10-CM | POA: Diagnosis not present

## 2017-07-14 DIAGNOSIS — I350 Nonrheumatic aortic (valve) stenosis: Secondary | ICD-10-CM | POA: Diagnosis not present

## 2017-07-14 DIAGNOSIS — I5022 Chronic systolic (congestive) heart failure: Secondary | ICD-10-CM | POA: Diagnosis not present

## 2017-07-14 DIAGNOSIS — K5790 Diverticulosis of intestine, part unspecified, without perforation or abscess without bleeding: Secondary | ICD-10-CM | POA: Diagnosis not present

## 2017-07-14 DIAGNOSIS — I110 Hypertensive heart disease with heart failure: Secondary | ICD-10-CM | POA: Diagnosis not present

## 2017-07-14 DIAGNOSIS — Z87440 Personal history of urinary (tract) infections: Secondary | ICD-10-CM | POA: Diagnosis not present

## 2017-07-14 DIAGNOSIS — S329XXD Fracture of unspecified parts of lumbosacral spine and pelvis, subsequent encounter for fracture with routine healing: Secondary | ICD-10-CM | POA: Diagnosis not present

## 2017-07-14 DIAGNOSIS — I700 Atherosclerosis of aorta: Secondary | ICD-10-CM | POA: Diagnosis not present

## 2017-07-14 DIAGNOSIS — Z9181 History of falling: Secondary | ICD-10-CM | POA: Diagnosis not present

## 2017-07-16 DIAGNOSIS — I350 Nonrheumatic aortic (valve) stenosis: Secondary | ICD-10-CM | POA: Diagnosis not present

## 2017-07-16 DIAGNOSIS — I700 Atherosclerosis of aorta: Secondary | ICD-10-CM | POA: Diagnosis not present

## 2017-07-16 DIAGNOSIS — I5022 Chronic systolic (congestive) heart failure: Secondary | ICD-10-CM | POA: Diagnosis not present

## 2017-07-16 DIAGNOSIS — I110 Hypertensive heart disease with heart failure: Secondary | ICD-10-CM | POA: Diagnosis not present

## 2017-07-16 DIAGNOSIS — Z87440 Personal history of urinary (tract) infections: Secondary | ICD-10-CM | POA: Diagnosis not present

## 2017-07-16 DIAGNOSIS — Z9181 History of falling: Secondary | ICD-10-CM | POA: Diagnosis not present

## 2017-07-16 DIAGNOSIS — K5790 Diverticulosis of intestine, part unspecified, without perforation or abscess without bleeding: Secondary | ICD-10-CM | POA: Diagnosis not present

## 2017-07-16 DIAGNOSIS — S329XXD Fracture of unspecified parts of lumbosacral spine and pelvis, subsequent encounter for fracture with routine healing: Secondary | ICD-10-CM | POA: Diagnosis not present

## 2017-07-17 DIAGNOSIS — S329XXD Fracture of unspecified parts of lumbosacral spine and pelvis, subsequent encounter for fracture with routine healing: Secondary | ICD-10-CM | POA: Diagnosis not present

## 2017-07-17 DIAGNOSIS — K5790 Diverticulosis of intestine, part unspecified, without perforation or abscess without bleeding: Secondary | ICD-10-CM | POA: Diagnosis not present

## 2017-07-17 DIAGNOSIS — I110 Hypertensive heart disease with heart failure: Secondary | ICD-10-CM | POA: Diagnosis not present

## 2017-07-17 DIAGNOSIS — Z87440 Personal history of urinary (tract) infections: Secondary | ICD-10-CM | POA: Diagnosis not present

## 2017-07-17 DIAGNOSIS — I350 Nonrheumatic aortic (valve) stenosis: Secondary | ICD-10-CM | POA: Diagnosis not present

## 2017-07-17 DIAGNOSIS — I700 Atherosclerosis of aorta: Secondary | ICD-10-CM | POA: Diagnosis not present

## 2017-07-17 DIAGNOSIS — Z9181 History of falling: Secondary | ICD-10-CM | POA: Diagnosis not present

## 2017-07-17 DIAGNOSIS — I5022 Chronic systolic (congestive) heart failure: Secondary | ICD-10-CM | POA: Diagnosis not present

## 2017-07-20 DIAGNOSIS — S329XXD Fracture of unspecified parts of lumbosacral spine and pelvis, subsequent encounter for fracture with routine healing: Secondary | ICD-10-CM | POA: Diagnosis not present

## 2017-07-20 DIAGNOSIS — I350 Nonrheumatic aortic (valve) stenosis: Secondary | ICD-10-CM | POA: Diagnosis not present

## 2017-07-20 DIAGNOSIS — Z9181 History of falling: Secondary | ICD-10-CM | POA: Diagnosis not present

## 2017-07-20 DIAGNOSIS — I5022 Chronic systolic (congestive) heart failure: Secondary | ICD-10-CM | POA: Diagnosis not present

## 2017-07-20 DIAGNOSIS — I700 Atherosclerosis of aorta: Secondary | ICD-10-CM | POA: Diagnosis not present

## 2017-07-20 DIAGNOSIS — K5790 Diverticulosis of intestine, part unspecified, without perforation or abscess without bleeding: Secondary | ICD-10-CM | POA: Diagnosis not present

## 2017-07-20 DIAGNOSIS — I110 Hypertensive heart disease with heart failure: Secondary | ICD-10-CM | POA: Diagnosis not present

## 2017-07-20 DIAGNOSIS — Z87440 Personal history of urinary (tract) infections: Secondary | ICD-10-CM | POA: Diagnosis not present

## 2017-07-21 DIAGNOSIS — Z9181 History of falling: Secondary | ICD-10-CM | POA: Diagnosis not present

## 2017-07-21 DIAGNOSIS — S329XXD Fracture of unspecified parts of lumbosacral spine and pelvis, subsequent encounter for fracture with routine healing: Secondary | ICD-10-CM | POA: Diagnosis not present

## 2017-07-21 DIAGNOSIS — I700 Atherosclerosis of aorta: Secondary | ICD-10-CM | POA: Diagnosis not present

## 2017-07-21 DIAGNOSIS — I110 Hypertensive heart disease with heart failure: Secondary | ICD-10-CM | POA: Diagnosis not present

## 2017-07-21 DIAGNOSIS — I5022 Chronic systolic (congestive) heart failure: Secondary | ICD-10-CM | POA: Diagnosis not present

## 2017-07-21 DIAGNOSIS — K5790 Diverticulosis of intestine, part unspecified, without perforation or abscess without bleeding: Secondary | ICD-10-CM | POA: Diagnosis not present

## 2017-07-21 DIAGNOSIS — I350 Nonrheumatic aortic (valve) stenosis: Secondary | ICD-10-CM | POA: Diagnosis not present

## 2017-07-21 DIAGNOSIS — Z87440 Personal history of urinary (tract) infections: Secondary | ICD-10-CM | POA: Diagnosis not present

## 2017-07-22 DIAGNOSIS — I110 Hypertensive heart disease with heart failure: Secondary | ICD-10-CM | POA: Diagnosis not present

## 2017-07-22 DIAGNOSIS — Z9181 History of falling: Secondary | ICD-10-CM | POA: Diagnosis not present

## 2017-07-22 DIAGNOSIS — S329XXD Fracture of unspecified parts of lumbosacral spine and pelvis, subsequent encounter for fracture with routine healing: Secondary | ICD-10-CM | POA: Diagnosis not present

## 2017-07-22 DIAGNOSIS — I350 Nonrheumatic aortic (valve) stenosis: Secondary | ICD-10-CM | POA: Diagnosis not present

## 2017-07-22 DIAGNOSIS — K5790 Diverticulosis of intestine, part unspecified, without perforation or abscess without bleeding: Secondary | ICD-10-CM | POA: Diagnosis not present

## 2017-07-22 DIAGNOSIS — I5022 Chronic systolic (congestive) heart failure: Secondary | ICD-10-CM | POA: Diagnosis not present

## 2017-07-22 DIAGNOSIS — Z87440 Personal history of urinary (tract) infections: Secondary | ICD-10-CM | POA: Diagnosis not present

## 2017-07-22 DIAGNOSIS — I700 Atherosclerosis of aorta: Secondary | ICD-10-CM | POA: Diagnosis not present

## 2017-07-27 DIAGNOSIS — Z9181 History of falling: Secondary | ICD-10-CM | POA: Diagnosis not present

## 2017-07-27 DIAGNOSIS — I110 Hypertensive heart disease with heart failure: Secondary | ICD-10-CM | POA: Diagnosis not present

## 2017-07-27 DIAGNOSIS — I5022 Chronic systolic (congestive) heart failure: Secondary | ICD-10-CM | POA: Diagnosis not present

## 2017-07-27 DIAGNOSIS — K5790 Diverticulosis of intestine, part unspecified, without perforation or abscess without bleeding: Secondary | ICD-10-CM | POA: Diagnosis not present

## 2017-07-27 DIAGNOSIS — Z87440 Personal history of urinary (tract) infections: Secondary | ICD-10-CM | POA: Diagnosis not present

## 2017-07-27 DIAGNOSIS — S329XXD Fracture of unspecified parts of lumbosacral spine and pelvis, subsequent encounter for fracture with routine healing: Secondary | ICD-10-CM | POA: Diagnosis not present

## 2017-07-27 DIAGNOSIS — I350 Nonrheumatic aortic (valve) stenosis: Secondary | ICD-10-CM | POA: Diagnosis not present

## 2017-07-27 DIAGNOSIS — I700 Atherosclerosis of aorta: Secondary | ICD-10-CM | POA: Diagnosis not present

## 2017-07-28 DIAGNOSIS — I110 Hypertensive heart disease with heart failure: Secondary | ICD-10-CM | POA: Diagnosis not present

## 2017-07-28 DIAGNOSIS — I700 Atherosclerosis of aorta: Secondary | ICD-10-CM | POA: Diagnosis not present

## 2017-07-28 DIAGNOSIS — I350 Nonrheumatic aortic (valve) stenosis: Secondary | ICD-10-CM | POA: Diagnosis not present

## 2017-07-28 DIAGNOSIS — I5022 Chronic systolic (congestive) heart failure: Secondary | ICD-10-CM | POA: Diagnosis not present

## 2017-07-28 DIAGNOSIS — Z87440 Personal history of urinary (tract) infections: Secondary | ICD-10-CM | POA: Diagnosis not present

## 2017-07-28 DIAGNOSIS — S329XXD Fracture of unspecified parts of lumbosacral spine and pelvis, subsequent encounter for fracture with routine healing: Secondary | ICD-10-CM | POA: Diagnosis not present

## 2017-07-28 DIAGNOSIS — Z9181 History of falling: Secondary | ICD-10-CM | POA: Diagnosis not present

## 2017-07-28 DIAGNOSIS — K5790 Diverticulosis of intestine, part unspecified, without perforation or abscess without bleeding: Secondary | ICD-10-CM | POA: Diagnosis not present

## 2017-07-29 DIAGNOSIS — I350 Nonrheumatic aortic (valve) stenosis: Secondary | ICD-10-CM | POA: Diagnosis not present

## 2017-07-29 DIAGNOSIS — Z87440 Personal history of urinary (tract) infections: Secondary | ICD-10-CM | POA: Diagnosis not present

## 2017-07-29 DIAGNOSIS — Z9181 History of falling: Secondary | ICD-10-CM | POA: Diagnosis not present

## 2017-07-29 DIAGNOSIS — I110 Hypertensive heart disease with heart failure: Secondary | ICD-10-CM | POA: Diagnosis not present

## 2017-07-29 DIAGNOSIS — S329XXD Fracture of unspecified parts of lumbosacral spine and pelvis, subsequent encounter for fracture with routine healing: Secondary | ICD-10-CM | POA: Diagnosis not present

## 2017-07-29 DIAGNOSIS — I5022 Chronic systolic (congestive) heart failure: Secondary | ICD-10-CM | POA: Diagnosis not present

## 2017-07-29 DIAGNOSIS — K5790 Diverticulosis of intestine, part unspecified, without perforation or abscess without bleeding: Secondary | ICD-10-CM | POA: Diagnosis not present

## 2017-07-29 DIAGNOSIS — I700 Atherosclerosis of aorta: Secondary | ICD-10-CM | POA: Diagnosis not present

## 2017-07-30 DIAGNOSIS — I350 Nonrheumatic aortic (valve) stenosis: Secondary | ICD-10-CM | POA: Diagnosis not present

## 2017-07-30 DIAGNOSIS — I5022 Chronic systolic (congestive) heart failure: Secondary | ICD-10-CM | POA: Diagnosis not present

## 2017-07-30 DIAGNOSIS — S329XXD Fracture of unspecified parts of lumbosacral spine and pelvis, subsequent encounter for fracture with routine healing: Secondary | ICD-10-CM | POA: Diagnosis not present

## 2017-07-30 DIAGNOSIS — Z87440 Personal history of urinary (tract) infections: Secondary | ICD-10-CM | POA: Diagnosis not present

## 2017-07-30 DIAGNOSIS — I110 Hypertensive heart disease with heart failure: Secondary | ICD-10-CM | POA: Diagnosis not present

## 2017-07-30 DIAGNOSIS — K5790 Diverticulosis of intestine, part unspecified, without perforation or abscess without bleeding: Secondary | ICD-10-CM | POA: Diagnosis not present

## 2017-07-30 DIAGNOSIS — Z9181 History of falling: Secondary | ICD-10-CM | POA: Diagnosis not present

## 2017-07-30 DIAGNOSIS — I700 Atherosclerosis of aorta: Secondary | ICD-10-CM | POA: Diagnosis not present

## 2017-07-31 DIAGNOSIS — R300 Dysuria: Secondary | ICD-10-CM | POA: Diagnosis not present

## 2017-08-03 DIAGNOSIS — Z87440 Personal history of urinary (tract) infections: Secondary | ICD-10-CM | POA: Diagnosis not present

## 2017-08-03 DIAGNOSIS — Z9181 History of falling: Secondary | ICD-10-CM | POA: Diagnosis not present

## 2017-08-03 DIAGNOSIS — I5022 Chronic systolic (congestive) heart failure: Secondary | ICD-10-CM | POA: Diagnosis not present

## 2017-08-03 DIAGNOSIS — I350 Nonrheumatic aortic (valve) stenosis: Secondary | ICD-10-CM | POA: Diagnosis not present

## 2017-08-03 DIAGNOSIS — I110 Hypertensive heart disease with heart failure: Secondary | ICD-10-CM | POA: Diagnosis not present

## 2017-08-03 DIAGNOSIS — K5790 Diverticulosis of intestine, part unspecified, without perforation or abscess without bleeding: Secondary | ICD-10-CM | POA: Diagnosis not present

## 2017-08-03 DIAGNOSIS — S329XXD Fracture of unspecified parts of lumbosacral spine and pelvis, subsequent encounter for fracture with routine healing: Secondary | ICD-10-CM | POA: Diagnosis not present

## 2017-08-03 DIAGNOSIS — I700 Atherosclerosis of aorta: Secondary | ICD-10-CM | POA: Diagnosis not present

## 2017-08-04 DIAGNOSIS — I959 Hypotension, unspecified: Secondary | ICD-10-CM | POA: Diagnosis not present

## 2017-08-04 DIAGNOSIS — Z87440 Personal history of urinary (tract) infections: Secondary | ICD-10-CM | POA: Diagnosis not present

## 2017-08-04 DIAGNOSIS — K5790 Diverticulosis of intestine, part unspecified, without perforation or abscess without bleeding: Secondary | ICD-10-CM | POA: Diagnosis not present

## 2017-08-04 DIAGNOSIS — Z9181 History of falling: Secondary | ICD-10-CM | POA: Diagnosis not present

## 2017-08-04 DIAGNOSIS — R609 Edema, unspecified: Secondary | ICD-10-CM | POA: Diagnosis not present

## 2017-08-04 DIAGNOSIS — I5022 Chronic systolic (congestive) heart failure: Secondary | ICD-10-CM | POA: Diagnosis not present

## 2017-08-04 DIAGNOSIS — D649 Anemia, unspecified: Secondary | ICD-10-CM | POA: Diagnosis not present

## 2017-08-04 DIAGNOSIS — I700 Atherosclerosis of aorta: Secondary | ICD-10-CM | POA: Diagnosis not present

## 2017-08-04 DIAGNOSIS — I110 Hypertensive heart disease with heart failure: Secondary | ICD-10-CM | POA: Diagnosis not present

## 2017-08-04 DIAGNOSIS — S329XXD Fracture of unspecified parts of lumbosacral spine and pelvis, subsequent encounter for fracture with routine healing: Secondary | ICD-10-CM | POA: Diagnosis not present

## 2017-08-04 DIAGNOSIS — I350 Nonrheumatic aortic (valve) stenosis: Secondary | ICD-10-CM | POA: Diagnosis not present

## 2017-08-05 DIAGNOSIS — I350 Nonrheumatic aortic (valve) stenosis: Secondary | ICD-10-CM | POA: Diagnosis not present

## 2017-08-05 DIAGNOSIS — S329XXD Fracture of unspecified parts of lumbosacral spine and pelvis, subsequent encounter for fracture with routine healing: Secondary | ICD-10-CM | POA: Diagnosis not present

## 2017-08-05 DIAGNOSIS — I5022 Chronic systolic (congestive) heart failure: Secondary | ICD-10-CM | POA: Diagnosis not present

## 2017-08-05 DIAGNOSIS — I700 Atherosclerosis of aorta: Secondary | ICD-10-CM | POA: Diagnosis not present

## 2017-08-05 DIAGNOSIS — Z87440 Personal history of urinary (tract) infections: Secondary | ICD-10-CM | POA: Diagnosis not present

## 2017-08-05 DIAGNOSIS — I110 Hypertensive heart disease with heart failure: Secondary | ICD-10-CM | POA: Diagnosis not present

## 2017-08-05 DIAGNOSIS — Z9181 History of falling: Secondary | ICD-10-CM | POA: Diagnosis not present

## 2017-08-05 DIAGNOSIS — K5790 Diverticulosis of intestine, part unspecified, without perforation or abscess without bleeding: Secondary | ICD-10-CM | POA: Diagnosis not present

## 2017-08-06 DIAGNOSIS — Z9181 History of falling: Secondary | ICD-10-CM | POA: Diagnosis not present

## 2017-08-06 DIAGNOSIS — I350 Nonrheumatic aortic (valve) stenosis: Secondary | ICD-10-CM | POA: Diagnosis not present

## 2017-08-06 DIAGNOSIS — I5022 Chronic systolic (congestive) heart failure: Secondary | ICD-10-CM | POA: Diagnosis not present

## 2017-08-06 DIAGNOSIS — I110 Hypertensive heart disease with heart failure: Secondary | ICD-10-CM | POA: Diagnosis not present

## 2017-08-06 DIAGNOSIS — Z87440 Personal history of urinary (tract) infections: Secondary | ICD-10-CM | POA: Diagnosis not present

## 2017-08-06 DIAGNOSIS — S329XXD Fracture of unspecified parts of lumbosacral spine and pelvis, subsequent encounter for fracture with routine healing: Secondary | ICD-10-CM | POA: Diagnosis not present

## 2017-08-06 DIAGNOSIS — I700 Atherosclerosis of aorta: Secondary | ICD-10-CM | POA: Diagnosis not present

## 2017-08-06 DIAGNOSIS — K5790 Diverticulosis of intestine, part unspecified, without perforation or abscess without bleeding: Secondary | ICD-10-CM | POA: Diagnosis not present

## 2017-08-13 DIAGNOSIS — Z9181 History of falling: Secondary | ICD-10-CM | POA: Diagnosis not present

## 2017-08-13 DIAGNOSIS — I350 Nonrheumatic aortic (valve) stenosis: Secondary | ICD-10-CM | POA: Diagnosis not present

## 2017-08-13 DIAGNOSIS — Z87440 Personal history of urinary (tract) infections: Secondary | ICD-10-CM | POA: Diagnosis not present

## 2017-08-13 DIAGNOSIS — K5790 Diverticulosis of intestine, part unspecified, without perforation or abscess without bleeding: Secondary | ICD-10-CM | POA: Diagnosis not present

## 2017-08-13 DIAGNOSIS — I5022 Chronic systolic (congestive) heart failure: Secondary | ICD-10-CM | POA: Diagnosis not present

## 2017-08-13 DIAGNOSIS — S329XXD Fracture of unspecified parts of lumbosacral spine and pelvis, subsequent encounter for fracture with routine healing: Secondary | ICD-10-CM | POA: Diagnosis not present

## 2017-08-13 DIAGNOSIS — I700 Atherosclerosis of aorta: Secondary | ICD-10-CM | POA: Diagnosis not present

## 2017-08-13 DIAGNOSIS — I110 Hypertensive heart disease with heart failure: Secondary | ICD-10-CM | POA: Diagnosis not present

## 2017-08-19 DIAGNOSIS — I739 Peripheral vascular disease, unspecified: Secondary | ICD-10-CM | POA: Diagnosis not present

## 2017-08-19 DIAGNOSIS — B351 Tinea unguium: Secondary | ICD-10-CM | POA: Diagnosis not present

## 2017-10-07 DIAGNOSIS — I10 Essential (primary) hypertension: Secondary | ICD-10-CM | POA: Diagnosis not present

## 2017-10-07 DIAGNOSIS — K5909 Other constipation: Secondary | ICD-10-CM | POA: Diagnosis not present

## 2017-10-08 DIAGNOSIS — Z79899 Other long term (current) drug therapy: Secondary | ICD-10-CM | POA: Diagnosis not present

## 2017-10-21 DIAGNOSIS — B351 Tinea unguium: Secondary | ICD-10-CM | POA: Diagnosis not present

## 2017-10-21 DIAGNOSIS — I739 Peripheral vascular disease, unspecified: Secondary | ICD-10-CM | POA: Diagnosis not present

## 2017-11-03 DIAGNOSIS — R05 Cough: Secondary | ICD-10-CM | POA: Diagnosis not present

## 2017-11-03 DIAGNOSIS — G8929 Other chronic pain: Secondary | ICD-10-CM | POA: Diagnosis not present

## 2017-11-03 DIAGNOSIS — J069 Acute upper respiratory infection, unspecified: Secondary | ICD-10-CM | POA: Diagnosis not present

## 2017-11-10 DIAGNOSIS — J069 Acute upper respiratory infection, unspecified: Secondary | ICD-10-CM | POA: Diagnosis not present

## 2017-11-10 DIAGNOSIS — R040 Epistaxis: Secondary | ICD-10-CM | POA: Diagnosis not present

## 2017-11-10 DIAGNOSIS — G308 Other Alzheimer's disease: Secondary | ICD-10-CM | POA: Diagnosis not present

## 2017-12-08 DIAGNOSIS — I959 Hypotension, unspecified: Secondary | ICD-10-CM | POA: Diagnosis not present

## 2017-12-08 DIAGNOSIS — R609 Edema, unspecified: Secondary | ICD-10-CM | POA: Diagnosis not present

## 2017-12-08 DIAGNOSIS — D649 Anemia, unspecified: Secondary | ICD-10-CM | POA: Diagnosis not present

## 2017-12-29 DIAGNOSIS — G8929 Other chronic pain: Secondary | ICD-10-CM | POA: Diagnosis not present

## 2017-12-29 DIAGNOSIS — G308 Other Alzheimer's disease: Secondary | ICD-10-CM | POA: Diagnosis not present

## 2018-03-02 DIAGNOSIS — H6123 Impacted cerumen, bilateral: Secondary | ICD-10-CM | POA: Diagnosis not present

## 2018-03-02 DIAGNOSIS — G309 Alzheimer's disease, unspecified: Secondary | ICD-10-CM | POA: Diagnosis not present

## 2018-03-02 DIAGNOSIS — H903 Sensorineural hearing loss, bilateral: Secondary | ICD-10-CM | POA: Diagnosis not present

## 2018-03-03 DIAGNOSIS — R2689 Other abnormalities of gait and mobility: Secondary | ICD-10-CM | POA: Diagnosis not present

## 2018-03-03 DIAGNOSIS — K219 Gastro-esophageal reflux disease without esophagitis: Secondary | ICD-10-CM | POA: Diagnosis not present

## 2018-03-03 DIAGNOSIS — I10 Essential (primary) hypertension: Secondary | ICD-10-CM | POA: Diagnosis not present

## 2018-03-15 ENCOUNTER — Emergency Department (HOSPITAL_COMMUNITY)
Admission: EM | Admit: 2018-03-15 | Discharge: 2018-03-15 | Disposition: A | Discharge status: Home / Self Care | Payer: Medicare Other | Attending: Emergency Medicine | Admitting: Emergency Medicine

## 2018-03-15 DIAGNOSIS — Z87891 Personal history of nicotine dependence: Secondary | ICD-10-CM | POA: Insufficient documentation

## 2018-03-15 DIAGNOSIS — I10 Essential (primary) hypertension: Secondary | ICD-10-CM | POA: Insufficient documentation

## 2018-03-15 DIAGNOSIS — Y939 Activity, unspecified: Secondary | ICD-10-CM | POA: Insufficient documentation

## 2018-03-15 DIAGNOSIS — L02211 Cutaneous abscess of abdominal wall: Secondary | ICD-10-CM | POA: Diagnosis not present

## 2018-03-15 DIAGNOSIS — X58XXXA Exposure to other specified factors, initial encounter: Secondary | ICD-10-CM | POA: Insufficient documentation

## 2018-03-15 DIAGNOSIS — F0391 Unspecified dementia with behavioral disturbance: Secondary | ICD-10-CM | POA: Diagnosis not present

## 2018-03-15 DIAGNOSIS — Z79899 Other long term (current) drug therapy: Secondary | ICD-10-CM | POA: Insufficient documentation

## 2018-03-15 DIAGNOSIS — L0291 Cutaneous abscess, unspecified: Secondary | ICD-10-CM

## 2018-03-15 DIAGNOSIS — Y929 Unspecified place or not applicable: Secondary | ICD-10-CM | POA: Insufficient documentation

## 2018-03-15 DIAGNOSIS — Z7401 Bed confinement status: Secondary | ICD-10-CM | POA: Diagnosis not present

## 2018-03-15 DIAGNOSIS — M2550 Pain in unspecified joint: Secondary | ICD-10-CM | POA: Diagnosis not present

## 2018-03-15 DIAGNOSIS — S31100A Unspecified open wound of abdominal wall, right upper quadrant without penetration into peritoneal cavity, initial encounter: Secondary | ICD-10-CM | POA: Diagnosis present

## 2018-03-15 DIAGNOSIS — Y999 Unspecified external cause status: Secondary | ICD-10-CM | POA: Insufficient documentation

## 2018-03-15 MED ORDER — SULFAMETHOXAZOLE-TRIMETHOPRIM 800-160 MG PO TABS: 1.0000 | ORAL_TABLET | Freq: Two times a day (BID) | ORAL | 0 refills | Status: AC

## 2018-03-15 NOTE — ED Provider Notes (Signed)
[facility] DEPT Provider Note   CSN: [id number] Arrival date & time: 03/15/18  1156     History   Chief Complaint Chief Complaint  Patient presents with  . Wound Check    HPI Jamie [name] is a 84 y.o. female.  The history is provided by the patient. No language interpreter was used.  Wound Check  This is a new problem. The current episode started 2 days ago. The problem occurs constantly. The problem has been gradually worsening. Nothing aggravates the symptoms. Nothing relieves the symptoms. She has tried nothing for the symptoms. The treatment provided no relief.  Pt has a sore on the right upper abdomen  Nursing home staff reports area is draining today  Past Medical History:  Diagnosis Date  . Anxiety   . Chronic pain   . Depression   . Gastric ulcer 05/2011  . Hyperlipidemia   . Hypertension   . Obesity    5'3"    Patient Active Problem List   Diagnosis Date Noted  . Obstipation 04/30/2017  . Aortic stenosis 06/22/2015  . Hypotension 06/21/2015  . Chest pain 06/21/2015  . Anemia associated with acute blood loss   . AKI (acute kidney injury) ([place])   . Tachypnea   . Hypoxia 04/18/2015  . Acute respiratory failure with hypoxia and hypercapnia ([place]) 04/17/2015  . Acute respiratory failure with hypoxia ([place]) 04/17/2015  . HCAP (healthcare-associated pneumonia) 04/17/2015  . Acute-on-chronic kidney injury ([place]) 04/17/2015  . Anemia 04/17/2015  . Sepsis ([place]) 04/17/2015  . Cellulitis of leg, right 01/01/2015  . Acute blood loss anemia 01/01/2015  . Elevated troponin 01/01/2015  . Hypokalemia 01/01/2015  . Depression 01/01/2015  . GI bleed 12/27/2014  . Bleeding gastrointestinal   . Hemorrhagic shock ([place])   . Alcohol dependence with uncomplicated withdrawal ([place])   . Alcohol dependence ([place]) 09/30/2014  . Dementia with behavioral disturbance 09/29/2014  . Delusions ([place]) 09/29/2014  . Dementia 12/13/2013  . GI bleeding  08/12/2012  . Obesity   . Chronic pain   . Gastric ulcer 05/03/2011  . Bladder spasm 02/10/2011  . Murmur, cardiac 11/20/2010  . SHOULDER PAIN, RIGHT 04/15/2010  . LOC OSTEOARTHROS NOT SPEC PRIM/SEC LOWER LEG 04/10/2010  . CALF PAIN, RIGHT 03/01/2010  . CERVICAL STRAIN, ACUTE 02/25/2010  . ANXIETY STATE, UNSPECIFIED 06/20/2009  . SEBACEOUS CYST, INFECTED 06/20/2009  . ALLERGIC RHINITIS DUE TO OTHER ALLERGEN 11/17/2008  . TACHYCARDIA 05/16/2008  . CONTUSION OF BREAST 05/05/2008  . HYPERCALCEMIA 01/20/2008  . EPISTAXIS 12/30/2007  . VITAMIN B12 DEFICIENCY 11/23/2007  . APHTHOUS ULCERS 11/23/2007  . ASYMPTOMATIC POSTMENOPAUSAL STATUS 08/18/2007  . Essential hypertension 05/10/2007  . ADVEF, DRUG/MEDICINAL/BIOLOGICAL SUBST NOS 05/10/2007  . Hyperlipemia 02/23/2007    Past Surgical History:  Procedure Laterality Date  . ABDOMINAL HYSTERECTOMY     PARTIAL  . BILATERAL OOPHORECTOMY    . ESOPHAGOGASTRODUODENOSCOPY  08/12/2012   Procedure: ESOPHAGOGASTRODUODENOSCOPY (EGD);  Surgeon: Arta Silence, MD;  Location: [facility];  Service: Endoscopy;  Laterality: Left;  . ESOPHAGOGASTRODUODENOSCOPY (EGD) WITH PROPOFOL N/A 12/28/2014   Procedure: ESOPHAGOGASTRODUODENOSCOPY (EGD) WITH PROPOFOL;  Surgeon: Wilford Corner, MD;  Location: [facility];  Service: Endoscopy;  Laterality: N/A;  . skin biopsy     nose lesion, noncancerous     OB History   None      Home Medications    Prior to Admission medications   Medication Sig Start Date End Date Taking? Authorizing Provider  acetaminophen (TYLENOL) 325 MG suppository Place  650 mg rectally every 4 (four) hours as needed (oral temp over 101).   Yes [provider]  acetaminophen (TYLENOL) 500 MG tablet Take 500 mg by mouth every 8 (eight) hours. At 0800, 1600 and 2400   Yes [provider]  ALPRAZolam (XANAX) 0.25 MG tablet Take 0.25 mg by mouth 2 (two) times daily as needed for anxiety.   Yes [provider]  ARIPiprazole (ABILIFY) 5 MG tablet Take 1 tablet (5 mg total) by mouth at bedtime. 06/24/15  Yes Charlynne Cousins, MD  busPIRone (BUSPAR) 5 MG tablet Take 5 mg by mouth 2 (two) times daily.    Yes [provider]  calcium carbonate (OS-CAL - DOSED IN MG OF ELEMENTAL CALCIUM) 1250 (500 Ca) MG tablet Take 1 tablet by mouth 2 (two) times daily with a meal.   Yes [provider]  furosemide (LASIX) 40 MG tablet Take 1 tablet (40 mg total) by mouth daily. 06/24/15  Yes Charlynne Cousins, MD  gabapentin (NEURONTIN) 100 MG capsule Take 1 capsule (100 mg total) by mouth at bedtime. 06/24/15  Yes Charlynne Cousins, MD  Hypromellose (ISOPTO TEARS) 0.5 % SOLN Apply 1 drop to eye 3 (three) times daily. One drop in each eye, three times daily. Patient taking differently: Place 1 drop into both eyes 3 (three) times daily. At 0800, 1400 and 2000 06/24/15  Yes Charlynne Cousins, MD  loperamide (IMODIUM) 2 MG capsule Take 2 mg by mouth as needed for diarrhea or loose stools. Not to exceed 6 doses in 24 hours   Yes [provider]  magnesium oxide (MAG-OX) 400 MG tablet Take 400 mg by mouth daily.   Yes [provider]  Melatonin 1 MG TABS Take 1 mg by mouth at bedtime.    Yes [provider]  oxymetazoline (AFRIN) 0.05 % nasal spray Place 2 sprays into both nostrils daily as needed (Nose Bleed).   Yes [provider]  pantoprazole (PROTONIX) 40 MG tablet Take 1 tablet (40 mg total) by mouth 2 (two) times daily before a meal. Patient taking differently: Take 40 mg by mouth daily.  06/24/15  Yes Charlynne Cousins, MD  polyethylene glycol [facility] Floria Raveling) packet Take 17 g by mouth daily. 06/24/15  Yes Charlynne Cousins, MD  potassium chloride 20 MEQ/15ML (10%) SOLN Take 60 mEq by mouth daily.   Yes [provider]  senna (SENOKOT) 8.6 MG TABS tablet Take 1 tablet (8.6 mg total) by mouth at bedtime. 06/24/15  Yes Charlynne Cousins, MD  sennosides-docusate sodium (SENOKOT-S) 8.6-50 MG tablet Take 1 tablet by mouth daily.   Yes [provider]  sertraline (ZOLOFT) 50 MG tablet Take 1 tablet (50 mg total) by mouth daily. 06/24/15  Yes Charlynne Cousins, MD  traMADol (ULTRAM) 50 MG tablet Take 1 tablet (50 mg total) by mouth every 8 (eight) hours as needed (for pain). 05/01/17  Yes Robbie Lis, MD  olopatadine (PATANOL) 0.1 % ophthalmic solution Place 1 drop into both eyes 2 (two) times daily. 06/24/15   Charlynne Cousins, MD  predniSONE (DELTASONE) 5 MG tablet Take 1 tablet (5 mg total) by mouth daily with breakfast. Patient taking differently: Take 10 mg by mouth daily with breakfast.  06/24/15   Charlynne Cousins, MD  sulfamethoxazole-trimethoprim (BACTRIM DS,SEPTRA DS) 800-160 MG tablet Take 1 tablet by mouth 2 (two) times daily for 7 days. 03/15/18 03/22/18  Fransico Meadow, PA-C  Family History Family History  Problem Relation Age of Onset  . Hypertension Mother   . Hyperlipidemia Mother   . Heart disease Father        20s  . Cancer Brother     Social History Social History   Tobacco Use  . Smoking status: Former Smoker    Packs/day: 1.00    Types: Cigarettes    Last attempt to quit: 09/02/1971    Years since quitting: 46.5  . Smokeless tobacco: Never Used  Substance Use Topics  . Alcohol use: Yes    Comment: occasionally  . Drug use: No     Allergies   Fluticasone and Pollen extract   Review of Systems Review of Systems  All other systems reviewed and are negative.    Physical Exam Updated Vital Signs BP (!) 133/93   Pulse 65   Temp 97.8 F (36.6 C) (Oral)   Resp 16   SpO2 97%   Physical Exam  Constitutional: She appears well-developed and well-nourished.  HENT:  Head: Normocephalic.  Cardiovascular: Normal rate.  Pulmonary/Chest: Effort normal.  Musculoskeletal: Normal range of motion.  Neurological: She is alert.  Skin: There is erythema.  2.5cm  erythematous area, pustule in center, area drained with culture swab.   Psychiatric: She has a normal mood and affect.  Nursing note and vitals reviewed.    ED Treatments / Results  Labs (all labs ordered are listed, but only abnormal results are displayed) Labs Reviewed  AEROBIC CULTURE (SUPERFICIAL SPECIMEN)    EKG None  Radiology No results found.  Procedures Procedures (including critical care time)  Medications Ordered in ED Medications - No data to display   Initial Impression / Assessment and Plan / ED Course  I have reviewed the triage vital signs and the nursing notes.  Pertinent labs & imaging results that were available during my care of the patient were reviewed by me and considered in my medical decision making (see chart for details).       Final Clinical Impressions(s) / ED Diagnoses   Final diagnoses:  Abscess    ED Discharge Orders        Ordered    sulfamethoxazole-trimethoprim (BACTRIM DS,SEPTRA DS) 800-160 MG tablet  2 times daily     03/15/18 1335    An After Visit Summary was printed and given to the patient.    Fransico Meadow, [place] 03/15/18 1340    Lacretia Leigh, MD 03/16/18 [phone]

## 2018-03-15 NOTE — Discharge Instructions (Signed)
Return if any problems.

## 2018-03-15 NOTE — ED Triage Notes (Signed)
Transported by [facility] from [facility]; staff noticed wound beneath right breast this morning. Staff reported white drainage coming from wound. +swollen, tender. AAO x 2 (baseline).

## 2018-03-15 NOTE — ED Notes (Signed)
Bed: WA21 Expected date:  Expected time:  Means of arrival:  Comments: 84 yo f right breast wound

## 2018-03-17 LAB — AEROBIC CULTURE  (SUPERFICIAL SPECIMEN)

## 2018-03-17 LAB — AEROBIC CULTURE W GRAM STAIN (SUPERFICIAL SPECIMEN): Culture: NORMAL

## 2018-03-18 ENCOUNTER — Telehealth: Payer: Self-pay | Admitting: *Deleted

## 2018-03-18 DIAGNOSIS — N390 Urinary tract infection, site not specified: Secondary | ICD-10-CM | POA: Diagnosis not present

## 2018-03-18 DIAGNOSIS — I10 Essential (primary) hypertension: Secondary | ICD-10-CM | POA: Diagnosis not present

## 2018-03-18 DIAGNOSIS — R269 Unspecified abnormalities of gait and mobility: Secondary | ICD-10-CM | POA: Diagnosis not present

## 2018-03-18 DIAGNOSIS — L0291 Cutaneous abscess, unspecified: Secondary | ICD-10-CM | POA: Diagnosis not present

## 2018-03-18 DIAGNOSIS — L02213 Cutaneous abscess of chest wall: Secondary | ICD-10-CM | POA: Diagnosis not present

## 2018-03-18 NOTE — Telephone Encounter (Signed)
Post ED Visit - Positive Culture Follow-up  Culture report reviewed by antimicrobial stewardship pharmacist:  []  Elenor Quinones, Pharm.D. []  Heide Guile, Pharm.D., BCPS AQ-ID []  Parks Neptune, Pharm.D., BCPS []  Alycia Rossetti, Pharm.D., BCPS []  [place], Pharm.D., BCPS, AAHIVP []  Legrand Como, Pharm.D., BCPS, AAHIVP [x]  Salome Arnt, PharmD, BCPS []  Johnnette Gourd, PharmD, BCPS []  Hughes Better, PharmD, BCPS []  Leeroy Cha, PharmD  Positive wound culture Treated with Sulfamethoxazole-Trimethoprim, organism sensitive to the same and no further patient follow-up is required at this time.  Harlon Flor [facility] 03/18/2018, 10:19 AM

## 2018-03-19 DIAGNOSIS — G8929 Other chronic pain: Secondary | ICD-10-CM | POA: Diagnosis not present

## 2018-03-19 DIAGNOSIS — Z9181 History of falling: Secondary | ICD-10-CM | POA: Diagnosis not present

## 2018-03-19 DIAGNOSIS — I10 Essential (primary) hypertension: Secondary | ICD-10-CM | POA: Diagnosis not present

## 2018-03-19 DIAGNOSIS — L02213 Cutaneous abscess of chest wall: Secondary | ICD-10-CM | POA: Diagnosis not present

## 2018-03-19 DIAGNOSIS — Z87891 Personal history of nicotine dependence: Secondary | ICD-10-CM | POA: Diagnosis not present

## 2018-03-23 DIAGNOSIS — Z87891 Personal history of nicotine dependence: Secondary | ICD-10-CM | POA: Diagnosis not present

## 2018-03-23 DIAGNOSIS — I10 Essential (primary) hypertension: Secondary | ICD-10-CM | POA: Diagnosis not present

## 2018-03-23 DIAGNOSIS — L02213 Cutaneous abscess of chest wall: Secondary | ICD-10-CM | POA: Diagnosis not present

## 2018-03-23 DIAGNOSIS — Z9181 History of falling: Secondary | ICD-10-CM | POA: Diagnosis not present

## 2018-03-23 DIAGNOSIS — G8929 Other chronic pain: Secondary | ICD-10-CM | POA: Diagnosis not present

## 2018-03-25 DIAGNOSIS — Z87891 Personal history of nicotine dependence: Secondary | ICD-10-CM | POA: Diagnosis not present

## 2018-03-25 DIAGNOSIS — Z9181 History of falling: Secondary | ICD-10-CM | POA: Diagnosis not present

## 2018-03-25 DIAGNOSIS — L02213 Cutaneous abscess of chest wall: Secondary | ICD-10-CM | POA: Diagnosis not present

## 2018-03-25 DIAGNOSIS — G8929 Other chronic pain: Secondary | ICD-10-CM | POA: Diagnosis not present

## 2018-03-25 DIAGNOSIS — I10 Essential (primary) hypertension: Secondary | ICD-10-CM | POA: Diagnosis not present

## 2018-03-29 DIAGNOSIS — L02213 Cutaneous abscess of chest wall: Secondary | ICD-10-CM | POA: Diagnosis not present

## 2018-03-29 DIAGNOSIS — I10 Essential (primary) hypertension: Secondary | ICD-10-CM | POA: Diagnosis not present

## 2018-03-29 DIAGNOSIS — Z9181 History of falling: Secondary | ICD-10-CM | POA: Diagnosis not present

## 2018-03-29 DIAGNOSIS — G8929 Other chronic pain: Secondary | ICD-10-CM | POA: Diagnosis not present

## 2018-03-29 DIAGNOSIS — Z87891 Personal history of nicotine dependence: Secondary | ICD-10-CM | POA: Diagnosis not present

## 2018-04-01 DIAGNOSIS — G8929 Other chronic pain: Secondary | ICD-10-CM | POA: Diagnosis not present

## 2018-04-01 DIAGNOSIS — Z87891 Personal history of nicotine dependence: Secondary | ICD-10-CM | POA: Diagnosis not present

## 2018-04-01 DIAGNOSIS — I10 Essential (primary) hypertension: Secondary | ICD-10-CM | POA: Diagnosis not present

## 2018-04-01 DIAGNOSIS — L02213 Cutaneous abscess of chest wall: Secondary | ICD-10-CM | POA: Diagnosis not present

## 2018-04-01 DIAGNOSIS — Z9181 History of falling: Secondary | ICD-10-CM | POA: Diagnosis not present

## 2018-04-04 DIAGNOSIS — I10 Essential (primary) hypertension: Secondary | ICD-10-CM | POA: Diagnosis not present

## 2018-04-04 DIAGNOSIS — L02213 Cutaneous abscess of chest wall: Secondary | ICD-10-CM | POA: Diagnosis not present

## 2018-04-04 DIAGNOSIS — Z87891 Personal history of nicotine dependence: Secondary | ICD-10-CM | POA: Diagnosis not present

## 2018-04-04 DIAGNOSIS — Z9181 History of falling: Secondary | ICD-10-CM | POA: Diagnosis not present

## 2018-04-04 DIAGNOSIS — G8929 Other chronic pain: Secondary | ICD-10-CM | POA: Diagnosis not present

## 2018-04-09 DIAGNOSIS — G8929 Other chronic pain: Secondary | ICD-10-CM | POA: Diagnosis not present

## 2018-04-09 DIAGNOSIS — I10 Essential (primary) hypertension: Secondary | ICD-10-CM | POA: Diagnosis not present

## 2018-04-09 DIAGNOSIS — L02213 Cutaneous abscess of chest wall: Secondary | ICD-10-CM | POA: Diagnosis not present

## 2018-04-09 DIAGNOSIS — Z87891 Personal history of nicotine dependence: Secondary | ICD-10-CM | POA: Diagnosis not present

## 2018-04-09 DIAGNOSIS — Z9181 History of falling: Secondary | ICD-10-CM | POA: Diagnosis not present

## 2018-04-16 DIAGNOSIS — I4460 Unspecified fascicular block: Secondary | ICD-10-CM | POA: Diagnosis not present

## 2018-06-29 DIAGNOSIS — Z79899 Other long term (current) drug therapy: Secondary | ICD-10-CM | POA: Diagnosis not present

## 2018-06-29 DIAGNOSIS — M810 Age-related osteoporosis without current pathological fracture: Secondary | ICD-10-CM | POA: Diagnosis not present

## 2018-06-29 DIAGNOSIS — I10 Essential (primary) hypertension: Secondary | ICD-10-CM | POA: Diagnosis not present

## 2018-07-06 DIAGNOSIS — R040 Epistaxis: Secondary | ICD-10-CM | POA: Diagnosis not present

## 2018-07-06 DIAGNOSIS — E559 Vitamin D deficiency, unspecified: Secondary | ICD-10-CM | POA: Diagnosis not present

## 2018-07-06 DIAGNOSIS — I10 Essential (primary) hypertension: Secondary | ICD-10-CM | POA: Diagnosis not present

## 2018-08-10 DIAGNOSIS — Z79899 Other long term (current) drug therapy: Secondary | ICD-10-CM | POA: Diagnosis not present

## 2018-09-08 DIAGNOSIS — I10 Essential (primary) hypertension: Secondary | ICD-10-CM | POA: Diagnosis not present

## 2018-09-08 DIAGNOSIS — K219 Gastro-esophageal reflux disease without esophagitis: Secondary | ICD-10-CM | POA: Diagnosis not present

## 2018-09-08 DIAGNOSIS — J301 Allergic rhinitis due to pollen: Secondary | ICD-10-CM | POA: Diagnosis not present

## 2018-11-02 DIAGNOSIS — Z79899 Other long term (current) drug therapy: Secondary | ICD-10-CM | POA: Diagnosis not present

## 2018-12-04 DIAGNOSIS — R0781 Pleurodynia: Secondary | ICD-10-CM | POA: Diagnosis not present

## 2018-12-08 DIAGNOSIS — I10 Essential (primary) hypertension: Secondary | ICD-10-CM | POA: Diagnosis not present

## 2018-12-08 DIAGNOSIS — S2242XA Multiple fractures of ribs, left side, initial encounter for closed fracture: Secondary | ICD-10-CM | POA: Diagnosis not present

## 2019-01-05 DIAGNOSIS — Z79899 Other long term (current) drug therapy: Secondary | ICD-10-CM | POA: Diagnosis not present

## 2019-01-05 DIAGNOSIS — I10 Essential (primary) hypertension: Secondary | ICD-10-CM | POA: Diagnosis not present

## 2019-01-05 DIAGNOSIS — K5901 Slow transit constipation: Secondary | ICD-10-CM | POA: Diagnosis not present

## 2019-01-26 DIAGNOSIS — F5101 Primary insomnia: Secondary | ICD-10-CM | POA: Diagnosis not present

## 2019-01-26 DIAGNOSIS — Z0289 Encounter for other administrative examinations: Secondary | ICD-10-CM | POA: Diagnosis not present

## 2019-01-26 DIAGNOSIS — K5901 Slow transit constipation: Secondary | ICD-10-CM | POA: Diagnosis not present

## 2019-02-23 DIAGNOSIS — Z79899 Other long term (current) drug therapy: Secondary | ICD-10-CM | POA: Diagnosis not present

## 2019-02-23 DIAGNOSIS — I10 Essential (primary) hypertension: Secondary | ICD-10-CM | POA: Diagnosis not present

## 2019-02-23 DIAGNOSIS — K219 Gastro-esophageal reflux disease without esophagitis: Secondary | ICD-10-CM | POA: Diagnosis not present

## 2019-02-23 DIAGNOSIS — R222 Localized swelling, mass and lump, trunk: Secondary | ICD-10-CM | POA: Diagnosis not present

## 2019-03-30 DIAGNOSIS — R270 Ataxia, unspecified: Secondary | ICD-10-CM | POA: Diagnosis not present

## 2019-03-30 DIAGNOSIS — K5909 Other constipation: Secondary | ICD-10-CM | POA: Diagnosis not present

## 2019-03-30 DIAGNOSIS — K219 Gastro-esophageal reflux disease without esophagitis: Secondary | ICD-10-CM | POA: Diagnosis not present

## 2019-04-12 DIAGNOSIS — R6889 Other general symptoms and signs: Secondary | ICD-10-CM | POA: Diagnosis not present

## 2019-04-13 DIAGNOSIS — K5909 Other constipation: Secondary | ICD-10-CM | POA: Diagnosis not present

## 2019-04-13 DIAGNOSIS — E559 Vitamin D deficiency, unspecified: Secondary | ICD-10-CM | POA: Diagnosis not present

## 2019-04-13 DIAGNOSIS — G4700 Insomnia, unspecified: Secondary | ICD-10-CM | POA: Diagnosis not present

## 2019-04-13 DIAGNOSIS — E876 Hypokalemia: Secondary | ICD-10-CM | POA: Diagnosis not present

## 2019-04-13 DIAGNOSIS — K219 Gastro-esophageal reflux disease without esophagitis: Secondary | ICD-10-CM | POA: Diagnosis not present

## 2019-05-18 DIAGNOSIS — R270 Ataxia, unspecified: Secondary | ICD-10-CM | POA: Diagnosis not present

## 2019-05-18 DIAGNOSIS — K5909 Other constipation: Secondary | ICD-10-CM | POA: Diagnosis not present

## 2019-05-18 DIAGNOSIS — K219 Gastro-esophageal reflux disease without esophagitis: Secondary | ICD-10-CM | POA: Diagnosis not present

## 2019-06-09 DIAGNOSIS — G4700 Insomnia, unspecified: Secondary | ICD-10-CM | POA: Diagnosis not present

## 2019-06-09 DIAGNOSIS — E876 Hypokalemia: Secondary | ICD-10-CM | POA: Diagnosis not present

## 2019-06-09 DIAGNOSIS — K5909 Other constipation: Secondary | ICD-10-CM | POA: Diagnosis not present

## 2019-06-09 DIAGNOSIS — B3789 Other sites of candidiasis: Secondary | ICD-10-CM | POA: Diagnosis not present

## 2019-06-09 DIAGNOSIS — K219 Gastro-esophageal reflux disease without esophagitis: Secondary | ICD-10-CM | POA: Diagnosis not present

## 2019-06-16 DIAGNOSIS — R270 Ataxia, unspecified: Secondary | ICD-10-CM | POA: Diagnosis not present

## 2019-06-16 DIAGNOSIS — K5909 Other constipation: Secondary | ICD-10-CM | POA: Diagnosis not present

## 2019-06-16 DIAGNOSIS — J309 Allergic rhinitis, unspecified: Secondary | ICD-10-CM | POA: Diagnosis not present

## 2019-06-16 DIAGNOSIS — K219 Gastro-esophageal reflux disease without esophagitis: Secondary | ICD-10-CM | POA: Diagnosis not present

## 2019-07-11 DIAGNOSIS — K219 Gastro-esophageal reflux disease without esophagitis: Secondary | ICD-10-CM | POA: Diagnosis not present

## 2019-07-11 DIAGNOSIS — K5909 Other constipation: Secondary | ICD-10-CM | POA: Diagnosis not present

## 2019-07-11 DIAGNOSIS — E559 Vitamin D deficiency, unspecified: Secondary | ICD-10-CM | POA: Diagnosis not present

## 2019-07-11 DIAGNOSIS — R270 Ataxia, unspecified: Secondary | ICD-10-CM | POA: Diagnosis not present

## 2019-08-11 DIAGNOSIS — R270 Ataxia, unspecified: Secondary | ICD-10-CM | POA: Diagnosis not present

## 2019-08-11 DIAGNOSIS — E876 Hypokalemia: Secondary | ICD-10-CM | POA: Diagnosis not present

## 2019-08-11 DIAGNOSIS — K219 Gastro-esophageal reflux disease without esophagitis: Secondary | ICD-10-CM | POA: Diagnosis not present

## 2019-08-11 DIAGNOSIS — K5909 Other constipation: Secondary | ICD-10-CM | POA: Diagnosis not present

## 2019-09-14 DIAGNOSIS — K219 Gastro-esophageal reflux disease without esophagitis: Secondary | ICD-10-CM | POA: Diagnosis not present

## 2019-09-14 DIAGNOSIS — K5909 Other constipation: Secondary | ICD-10-CM | POA: Diagnosis not present

## 2019-09-14 DIAGNOSIS — E559 Vitamin D deficiency, unspecified: Secondary | ICD-10-CM | POA: Diagnosis not present

## 2019-09-14 DIAGNOSIS — E876 Hypokalemia: Secondary | ICD-10-CM | POA: Diagnosis not present

## 2020-08-10 ENCOUNTER — Other Ambulatory Visit: Payer: Self-pay

## 2020-08-10 ENCOUNTER — Encounter (HOSPITAL_COMMUNITY): Payer: Self-pay | Admitting: Emergency Medicine

## 2020-08-10 ENCOUNTER — Emergency Department (HOSPITAL_COMMUNITY)
Admission: EM | Admit: 2020-08-10 | Discharge: 2020-08-11 | Disposition: A | Discharge status: Home / Self Care | Payer: Medicare Other | Attending: Emergency Medicine | Admitting: Emergency Medicine

## 2020-08-10 DIAGNOSIS — F0391 Unspecified dementia with behavioral disturbance: Secondary | ICD-10-CM | POA: Insufficient documentation

## 2020-08-10 DIAGNOSIS — I10 Essential (primary) hypertension: Secondary | ICD-10-CM | POA: Insufficient documentation

## 2020-08-10 DIAGNOSIS — Z87891 Personal history of nicotine dependence: Secondary | ICD-10-CM | POA: Insufficient documentation

## 2020-08-10 DIAGNOSIS — Z79899 Other long term (current) drug therapy: Secondary | ICD-10-CM | POA: Insufficient documentation

## 2020-08-10 DIAGNOSIS — R040 Epistaxis: Secondary | ICD-10-CM | POA: Insufficient documentation

## 2020-08-10 HISTORY — DX: Unspecified dementia, unspecified severity, without behavioral disturbance, psychotic disturbance, mood disturbance, and anxiety: F03.90

## 2020-08-10 MED ORDER — OXYMETAZOLINE HCL 0.05 % NA SOLN: 1.0000 | Freq: Once | NASAL | Status: DC

## 2020-08-10 NOTE — Discharge Instructions (Signed)
Please follow up with your primary care provider within 5-7 days for re-evaluation of your symptoms. If you do not have a primary care provider, information for a healthcare clinic has been provided for you to make arrangements for follow up care. Please return to the emergency department for any new or worsening symptoms. ° °

## 2020-08-10 NOTE — ED Triage Notes (Addendum)
Patient arrives from [facility] for non-traumatic epistaxis x1 hour. Patient has hx of dementia and can be combative. On scene, patient was swatting at EMS when they attempted to put pressure on her nose. Patient mostly non-verbal, is at baseline. Bleeding currently controlled.

## 2020-08-10 NOTE — ED Provider Notes (Signed)
[place] [facility] DEPT Provider Note   CSN: [id number] Arrival date & time: 08/10/20  2020     History Chief Complaint  Patient presents with  . Epistaxis    Jamie [name] is a 86 y.o. female.  HPI   86 year old female history of anxiety, chronic pain, dementia, depression, gastric ulcer, hyperlipidemia, hypertension, obesity, who presents to the emergency department today for evaluation of epistaxis.  Patient has a history of dementia and is essentially nonverbal at baseline.  I contacted the facility and spoke with the med tech on call.  She states that the patient was sitting at dinner when she developed a bloody nose.  She tried to apply pressure but the bleeding continued so she called EMS who also was unable to achieve hemostasis therefore the patient was sent to the ED.  Past Medical History:  Diagnosis Date  . Anxiety   . Chronic pain   . Dementia ([place])   . Depression   . Gastric ulcer 05/2011  . Hyperlipidemia   . Hypertension   . Obesity    5'3"    Patient Active Problem List   Diagnosis Date Noted  . Obstipation 04/30/2017  . Aortic stenosis 06/22/2015  . Hypotension 06/21/2015  . Chest pain 06/21/2015  . Anemia associated with acute blood loss   . AKI (acute kidney injury) ([place])   . Tachypnea   . Hypoxia 04/18/2015  . Acute respiratory failure with hypoxia and hypercapnia ([place]) 04/17/2015  . Acute respiratory failure with hypoxia ([place]) 04/17/2015  . HCAP (healthcare-associated pneumonia) 04/17/2015  . Acute-on-chronic kidney injury ([place]) 04/17/2015  . Anemia 04/17/2015  . Sepsis ([place]) 04/17/2015  . Cellulitis of leg, right 01/01/2015  . Acute blood loss anemia 01/01/2015  . Elevated troponin 01/01/2015  . Hypokalemia 01/01/2015  . Depression 01/01/2015  . GI bleed 12/27/2014  . Bleeding gastrointestinal   . Hemorrhagic shock ([place])   . Alcohol dependence with uncomplicated withdrawal ([place])   . Alcohol dependence ([place])  09/30/2014  . Dementia with behavioral disturbance ([place]) 09/29/2014  . Delusions ([place]) 09/29/2014  . Dementia ([place]) 12/13/2013  . GI bleeding 08/12/2012  . Obesity   . Chronic pain   . Gastric ulcer 05/03/2011  . Bladder spasm 02/10/2011  . Murmur, cardiac 11/20/2010  . SHOULDER PAIN, RIGHT 04/15/2010  . LOC OSTEOARTHROS NOT SPEC PRIM/SEC LOWER LEG 04/10/2010  . CALF PAIN, RIGHT 03/01/2010  . CERVICAL STRAIN, ACUTE 02/25/2010  . ANXIETY STATE, UNSPECIFIED 06/20/2009  . SEBACEOUS CYST, INFECTED 06/20/2009  . ALLERGIC RHINITIS DUE TO OTHER ALLERGEN 11/17/2008  . TACHYCARDIA 05/16/2008  . CONTUSION OF BREAST 05/05/2008  . HYPERCALCEMIA 01/20/2008  . EPISTAXIS 12/30/2007  . VITAMIN B12 DEFICIENCY 11/23/2007  . APHTHOUS ULCERS 11/23/2007  . ASYMPTOMATIC POSTMENOPAUSAL STATUS 08/18/2007  . Essential hypertension 05/10/2007  . ADVEF, DRUG/MEDICINAL/BIOLOGICAL SUBST NOS 05/10/2007  . Hyperlipemia 02/23/2007    Past Surgical History:  Procedure Laterality Date  . ABDOMINAL HYSTERECTOMY     PARTIAL  . BILATERAL OOPHORECTOMY    . ESOPHAGOGASTRODUODENOSCOPY  08/12/2012   Procedure: ESOPHAGOGASTRODUODENOSCOPY (EGD);  Surgeon: Arta Silence, MD;  Location: [facility];  Service: Endoscopy;  Laterality: Left;  . ESOPHAGOGASTRODUODENOSCOPY (EGD) WITH PROPOFOL N/A 12/28/2014   Procedure: ESOPHAGOGASTRODUODENOSCOPY (EGD) WITH PROPOFOL;  Surgeon: Wilford Corner, MD;  Location: [facility];  Service: Endoscopy;  Laterality: N/A;  . skin biopsy     nose lesion, noncancerous     OB History   No obstetric history on file.     Family  History  Problem Relation Age of Onset  . Hypertension Mother   . Hyperlipidemia Mother   . Heart disease Father        76s  . Cancer Brother     Social History   Tobacco Use  . Smoking status: Former Smoker    Packs/day: 1.00    Types: Cigarettes    Quit date: 09/02/1971    Years since quitting: 48.9  . Smokeless tobacco: Never Used  Vaping  Use  . Vaping Use: Never used  Substance Use Topics  . Alcohol use: Not Currently    Comment: occasionally  . Drug use: No    Home Medications Prior to Admission medications   Medication Sig Start Date End Date Taking? Authorizing Provider  acetaminophen (TYLENOL) 325 MG suppository Place 650 mg rectally every 4 (four) hours as needed (oral temp over 101).    [provider]  acetaminophen (TYLENOL) 500 MG tablet Take 500 mg by mouth every 8 (eight) hours. At 0800, 1600 and 2400    [provider]  ALPRAZolam (XANAX) 0.25 MG tablet Take 0.25 mg by mouth 2 (two) times daily as needed for anxiety.    [provider]  ARIPiprazole (ABILIFY) 5 MG tablet Take 1 tablet (5 mg total) by mouth at bedtime. 06/24/15   Charlynne Cousins, MD  busPIRone (BUSPAR) 5 MG tablet Take 5 mg by mouth 2 (two) times daily.     [provider]  calcium carbonate (OS-CAL - DOSED IN MG OF ELEMENTAL CALCIUM) 1250 (500 Ca) MG tablet Take 1 tablet by mouth 2 (two) times daily with a meal.    [provider]  furosemide (LASIX) 40 MG tablet Take 1 tablet (40 mg total) by mouth daily. 06/24/15   Charlynne Cousins, MD  gabapentin (NEURONTIN) 100 MG capsule Take 1 capsule (100 mg total) by mouth at bedtime. 06/24/15   Charlynne Cousins, MD  Hypromellose (ISOPTO TEARS) 0.5 % SOLN Apply 1 drop to eye 3 (three) times daily. One drop in each eye, three times daily. Patient taking differently: Place 1 drop into both eyes 3 (three) times daily. At 0800, 1400 and 2000 06/24/15   Charlynne Cousins, MD  loperamide (IMODIUM) 2 MG capsule Take 2 mg by mouth as needed for diarrhea or loose stools. Not to exceed 6 doses in 24 hours    [provider]  magnesium oxide (MAG-OX) 400 MG tablet Take 400 mg by mouth daily.    [provider]  Melatonin 1 MG TABS Take 1 mg by mouth at bedtime.     [provider]  olopatadine (PATANOL) 0.1 % ophthalmic solution  Place 1 drop into both eyes 2 (two) times daily. 06/24/15   Charlynne Cousins, MD  oxymetazoline (AFRIN) 0.05 % nasal spray Place 2 sprays into both nostrils daily as needed (Nose Bleed).    [provider]  pantoprazole (PROTONIX) 40 MG tablet Take 1 tablet (40 mg total) by mouth 2 (two) times daily before a meal. Patient taking differently: Take 40 mg by mouth daily.  06/24/15   Charlynne Cousins, MD  polyethylene glycol [facility] Floria Raveling) packet Take 17 g by mouth daily. 06/24/15   Charlynne Cousins, MD  potassium chloride 20 MEQ/15ML (10%) SOLN Take 60 mEq by mouth daily.    [provider]  predniSONE (DELTASONE) 5 MG tablet Take 1 tablet (5 mg total) by mouth daily with breakfast. Patient taking differently: Take 10 mg by mouth  daily with breakfast.  06/24/15   Charlynne Cousins, MD  senna (SENOKOT) 8.6 MG TABS tablet Take 1 tablet (8.6 mg total) by mouth at bedtime. 06/24/15   Charlynne Cousins, MD  sennosides-docusate sodium (SENOKOT-S) 8.6-50 MG tablet Take 1 tablet by mouth daily.    [provider]  sertraline (ZOLOFT) 50 MG tablet Take 1 tablet (50 mg total) by mouth daily. 06/24/15   Charlynne Cousins, MD  traMADol (ULTRAM) 50 MG tablet Take 1 tablet (50 mg total) by mouth every 8 (eight) hours as needed (for pain). 05/01/17   Robbie Lis, MD    Allergies    Fluticasone and Pollen extract  Review of Systems   Review of Systems  HENT: Positive for nosebleeds.     Physical Exam Updated Vital Signs BP (!) 160/82 (BP Location: Right Arm)   Pulse 68   Temp 97.9 F (36.6 C)   Resp 15   SpO2 96%   Physical Exam Vitals and nursing note reviewed.  Constitutional:      General: She is not in acute distress.    Appearance: She is well-developed and well-nourished.  HENT:     Head: Normocephalic and atraumatic.     Nose:     Comments: Dried blood noted to the left nares however no active bleeding noted. Eyes:      Conjunctiva/sclera: Conjunctivae normal.  Cardiovascular:     Rate and Rhythm: Normal rate.  Pulmonary:     Effort: Pulmonary effort is normal.  Musculoskeletal:        General: Normal range of motion.     Cervical back: Neck supple.  Skin:    General: Skin is warm and dry.  Neurological:     Mental Status: She is alert.  Psychiatric:        Mood and Affect: Mood and affect normal.     ED Results / Procedures / Treatments   Labs (all labs ordered are listed, but only abnormal results are displayed) Labs Reviewed - No data to display  EKG None  Radiology No results found.  Procedures Procedures (including critical care time)  Medications Ordered in ED Medications  oxymetazoline (AFRIN) 0.05 % nasal spray 1 spray (0 sprays Each Nare Hold 08/10/20 2036)    ED Course  I have reviewed the triage vital signs and the nursing notes.  Pertinent labs & imaging results that were available during my care of the patient were reviewed by me and considered in my medical decision making (see chart for details).    MDM Rules/Calculators/A&P                          86 year old female presenting for evaluation of epistaxis.  On my evaluation bleeding has stopped.  Epistaxis was atraumatic according to staff.  Patient is not anticoagulated.  She is well-appearing and has been monitored in the department for period of 2 hours without any recurrence of symptoms.  Feel she is appropriate for discharge back to her facility.  Final Clinical Impression(s) / ED Diagnoses Final diagnoses:  Epistaxis    Rx / DC Orders ED Discharge Orders    None       Rodney Booze, PA-C 08/10/20 2233    Lorelle Gibbs, DO 08/10/20 2355

## 2022-01-23 ENCOUNTER — Other Ambulatory Visit: Payer: Self-pay

## 2022-01-23 ENCOUNTER — Encounter (HOSPITAL_COMMUNITY): Payer: Self-pay

## 2022-01-23 ENCOUNTER — Emergency Department (HOSPITAL_COMMUNITY)
Admission: EM | Admit: 2022-01-23 | Discharge: 2022-01-23 | Disposition: A | Discharge status: Skilled Nursing Facility | Payer: Self-pay | Attending: Emergency Medicine | Admitting: Emergency Medicine

## 2022-01-23 ENCOUNTER — Emergency Department (HOSPITAL_COMMUNITY): Payer: Self-pay

## 2022-01-23 DIAGNOSIS — S51011A Laceration without foreign body of right elbow, initial encounter: Secondary | ICD-10-CM | POA: Insufficient documentation

## 2022-01-23 DIAGNOSIS — W1839XA Other fall on same level, initial encounter: Secondary | ICD-10-CM | POA: Insufficient documentation

## 2022-01-23 DIAGNOSIS — F0390 Unspecified dementia without behavioral disturbance: Secondary | ICD-10-CM | POA: Insufficient documentation

## 2022-01-23 DIAGNOSIS — N390 Urinary tract infection, site not specified: Secondary | ICD-10-CM | POA: Insufficient documentation

## 2022-01-23 DIAGNOSIS — W19XXXA Unspecified fall, initial encounter: Secondary | ICD-10-CM

## 2022-01-23 DIAGNOSIS — S71011A Laceration without foreign body, right hip, initial encounter: Secondary | ICD-10-CM | POA: Insufficient documentation

## 2022-01-23 DIAGNOSIS — E871 Hypo-osmolality and hyponatremia: Secondary | ICD-10-CM | POA: Insufficient documentation

## 2022-01-23 DIAGNOSIS — S81012A Laceration without foreign body, left knee, initial encounter: Secondary | ICD-10-CM | POA: Insufficient documentation

## 2022-01-23 LAB — COMPREHENSIVE METABOLIC PANEL
ALT: 14 U/L (ref 0–44)
AST: 24 U/L (ref 15–41)
Albumin: 3.6 g/dL (ref 3.5–5.0)
Alkaline Phosphatase: 70 U/L (ref 38–126)
Anion gap: 6 (ref 5–15)
BUN: 23 mg/dL (ref 8–23)
CO2: 30 mmol/L (ref 22–32)
Calcium: 10.4 mg/dL — ABNORMAL HIGH (ref 8.9–10.3)
Chloride: 110 mmol/L (ref 98–111)
Creatinine, Ser: 0.93 mg/dL (ref 0.44–1.00)
GFR, Estimated: 58 mL/min — ABNORMAL LOW (ref >60)
Glucose, Bld: 95 mg/dL (ref 70–99)
Potassium: 3.8 mmol/L (ref 3.5–5.1)
Sodium: 146 mmol/L — ABNORMAL HIGH (ref 135–145)
Total Bilirubin: 1.1 mg/dL (ref 0.3–1.2)
Total Protein: 6.6 g/dL (ref 6.5–8.1)

## 2022-01-23 LAB — URINALYSIS, ROUTINE W REFLEX MICROSCOPIC
Bilirubin Urine: NEGATIVE
Glucose, UA: NEGATIVE mg/dL
Hgb urine dipstick: NEGATIVE
Ketones, ur: NEGATIVE mg/dL
Nitrite: NEGATIVE
Protein, ur: NEGATIVE mg/dL
Specific Gravity, Urine: 1.011 (ref 1.005–1.030)
WBC, UA: >50 WBC/hpf — ABNORMAL HIGH (ref 0–5)
pH: 7 (ref 5.0–8.0)

## 2022-01-23 LAB — CBC
HCT: 41.8 % (ref 36.0–46.0)
Hemoglobin: 13.6 g/dL (ref 12.0–15.0)
MCH: 29.1 pg (ref 26.0–34.0)
MCHC: 32.5 g/dL (ref 30.0–36.0)
MCV: 89.5 fL (ref 80.0–100.0)
Platelets: 214 10*3/uL (ref 150–400)
RBC: 4.67 MIL/uL (ref 3.87–5.11)
RDW: 14.8 % (ref 11.5–15.5)
WBC: 4.8 10*3/uL (ref 4.0–10.5)
nRBC: 0 % (ref 0.0–0.2)

## 2022-01-23 MED ORDER — SODIUM CHLORIDE 0.9 % IV SOLN
1.0000 g | Freq: Once | INTRAVENOUS | Status: AC
  Administered 2022-01-23: 1 g via INTRAVENOUS
  Filled 2022-01-23: qty 10

## 2022-01-23 MED ORDER — CEPHALEXIN 500 MG PO CAPS: 500.0000 mg | ORAL_CAPSULE | Freq: Four times a day (QID) | ORAL | 0 refills | Status: DC

## 2022-01-23 NOTE — ED Triage Notes (Signed)
Pt bib ems for witnessed fall from standing.  Per staff @ [name] pt fell from standing w/ walker and guarded head during fall.  No deformities, no bruising or hematomas found upon arrival to ED.  Pt hx of Dementia. Pt not on blood thinners per Facility staff.

## 2022-01-23 NOTE — Discharge Instructions (Signed)
Patient diagnosed with urinary tract infection here and given prescription for keflex. Please return if any new symptoms

## 2022-01-23 NOTE — ED Notes (Signed)
Report given to staff member at [facility].

## 2022-01-23 NOTE — ED Provider Notes (Signed)
[place] DEPT Provider Note   CSN: [id number] Arrival date & time: 01/23/22  0109     History  Chief Complaint  Patient presents with   Jamie [name] is a 88 y.o. female.  HPI Level 5 caveat 88 yo female from alf with report that she had a witnessed fall. I received ho from RN who received from EMS.  They reported that patient walks with walker.  She was witnessed to have her walker get too far in front of her and she fell in a sitting position.  She covered her head with her arms.  She is not on blood thinner.  NO report of loc.  Patient is reported to "be like this" referencing her lack of speaking or following directions.     Home Medications Prior to Admission medications   Medication Sig Start Date End Date Taking? Authorizing Provider  cephALEXin (KEFLEX) 500 MG capsule Take 1 capsule (500 mg total) by mouth 4 (four) times daily. 01/23/22  Yes Pattricia Boss, MD  acetaminophen (TYLENOL) 325 MG suppository Place 650 mg rectally every 4 (four) hours as needed (oral temp over 101).    [provider]  acetaminophen (TYLENOL) 500 MG tablet Take 500 mg by mouth every 8 (eight) hours. At 0800, 1600 and 2400    [provider]  ALPRAZolam (XANAX) 0.25 MG tablet Take 0.25 mg by mouth 2 (two) times daily as needed for anxiety.    [provider]  ARIPiprazole (ABILIFY) 5 MG tablet Take 1 tablet (5 mg total) by mouth at bedtime. 06/24/15   Charlynne Cousins, MD  busPIRone (BUSPAR) 5 MG tablet Take 5 mg by mouth 2 (two) times daily.     [provider]  calcium carbonate (OS-CAL - DOSED IN MG OF ELEMENTAL CALCIUM) 1250 (500 Ca) MG tablet Take 1 tablet by mouth 2 (two) times daily with a meal.    [provider]  furosemide (LASIX) 40 MG tablet Take 1 tablet (40 mg total) by mouth daily. 06/24/15   Charlynne Cousins, MD  gabapentin (NEURONTIN) 100 MG capsule Take 1 capsule (100 mg total) by mouth at  bedtime. 06/24/15   Charlynne Cousins, MD  Hypromellose (ISOPTO TEARS) 0.5 % SOLN Apply 1 drop to eye 3 (three) times daily. One drop in each eye, three times daily. Patient taking differently: Place 1 drop into both eyes 3 (three) times daily. At 0800, 1400 and 2000 06/24/15   Charlynne Cousins, MD  loperamide (IMODIUM) 2 MG capsule Take 2 mg by mouth as needed for diarrhea or loose stools. Not to exceed 6 doses in 24 hours    [provider]  magnesium oxide (MAG-OX) 400 MG tablet Take 400 mg by mouth daily.    [provider]  Melatonin 1 MG TABS Take 1 mg by mouth at bedtime.     [provider]  olopatadine (PATANOL) 0.1 % ophthalmic solution Place 1 drop into both eyes 2 (two) times daily. 06/24/15   Charlynne Cousins, MD  oxymetazoline (AFRIN) 0.05 % nasal spray Place 2 sprays into both nostrils daily as needed (Nose Bleed).    [provider]  pantoprazole (PROTONIX) 40 MG tablet Take 1 tablet (40 mg total) by mouth 2 (two) times daily before a meal. Patient taking differently: Take 40 mg by mouth daily.  06/24/15   Charlynne Cousins, MD  polyethylene glycol [facility] / Floria Raveling) packet Take 17 g by  mouth daily. 06/24/15   Charlynne Cousins, MD  potassium chloride 20 MEQ/15ML (10%) SOLN Take 60 mEq by mouth daily.    [provider]  predniSONE (DELTASONE) 5 MG tablet Take 1 tablet (5 mg total) by mouth daily with breakfast. Patient taking differently: Take 10 mg by mouth daily with breakfast.  06/24/15   Charlynne Cousins, MD  senna (SENOKOT) 8.6 MG TABS tablet Take 1 tablet (8.6 mg total) by mouth at bedtime. 06/24/15   Charlynne Cousins, MD  sennosides-docusate sodium (SENOKOT-S) 8.6-50 MG tablet Take 1 tablet by mouth daily.    [provider]  sertraline (ZOLOFT) 50 MG tablet Take 1 tablet (50 mg total) by mouth daily. 06/24/15   Charlynne Cousins, MD  traMADol (ULTRAM) 50 MG tablet Take 1 tablet (50 mg total)  by mouth every 8 (eight) hours as needed (for pain). 05/01/17   Robbie Lis, MD      Allergies    Fluticasone and Pollen extract    Review of Systems   Review of Systems  Physical Exam Updated Vital Signs BP (!) 152/73   Pulse (!) 58   Temp 97.9 F (36.6 C) (Rectal)   Resp 16   Ht 1.524 m (5')   Wt 64.4 kg   SpO2 98%   BMI 27.73 kg/m  Physical Exam Vitals and nursing note reviewed.  Constitutional:      General: She is not in acute distress. HENT:     Head: Normocephalic and atraumatic.     Right Ear: External ear normal.     Left Ear: External ear normal.     Nose: Nose normal.  Eyes:     Pupils: Pupils are equal, round, and reactive to light.  Cardiovascular:     Rate and Rhythm: Normal rate and regular rhythm.  Pulmonary:     Effort: Pulmonary effort is normal.     Breath sounds: Normal breath sounds.  Abdominal:     General: Bowel sounds are normal. There is no distension.     Palpations: Abdomen is soft.  Musculoskeletal:     Cervical back: No tenderness.     Comments: Appears to have some pain with bilateral hip movment, right >left Pulses intact x 4 extremities   Skin:    Comments: Multiple different aged bruises, contusions, and skin tears Left knee Right hip  Right elbow  Neurological:     General: No focal deficit present.     Mental Status: She is alert.     Comments: Patietn nonverbal with me Opens eyes Moans to painful stimuli No lateralized weakness noted    ED Results / Procedures / Treatments   Labs (all labs ordered are listed, but only abnormal results are displayed) Labs Reviewed  URINALYSIS, ROUTINE W REFLEX MICROSCOPIC - Abnormal; Notable for the following components:      Result Value   APPearance CLOUDY (*)    Leukocytes,Ua MODERATE (*)    WBC, UA >50 (*)    Bacteria, UA RARE (*)    All other components within normal limits  COMPREHENSIVE METABOLIC PANEL - Abnormal; Notable for the following components:   Sodium 146 (*)     Calcium 10.4 (*)    GFR, Estimated 58 (*)    All other components within normal limits  URINE CULTURE  CBC    EKG EKG Interpretation  Date/Time:  Thursday Jan 23 2022 09:31:34 EDT Ventricular Rate:  64 PR Interval:    QRS Duration: 104 QT  Interval:  400 QTC Calculation: 413 R Axis:   38 Text Interpretation: Normal sinus rhythm st elevation v1, nonspecific Confirmed by Pattricia Boss [phone]) on 01/23/2022 10:08:35 AM  Radiology CT Head Wo Contrast  Result Date: 01/23/2022 CLINICAL DATA:  [place] from standing position.  Head trauma, minor. EXAM: CT HEAD WITHOUT CONTRAST TECHNIQUE: Contiguous axial images were obtained from the base of the skull through the vertex without intravenous contrast. RADIATION DOSE REDUCTION: This exam was performed according to the departmental dose-optimization program which includes automated exposure control, adjustment of the mA and/or kV according to patient size and/or use of iterative reconstruction technique. COMPARISON:  03/27/2017 FINDINGS: Brain: Generalized atrophy. Chronic small-vessel ischemic changes of the white matter. No sign of acute infarction, mass lesion, hemorrhage, hydrocephalus or extra-axial collection. Findings are mildly progressive since 2018. Vascular: There is atherosclerotic calcification of the major vessels at the base of the brain. Skull: Negative Sinuses/Orbits: Clear/normal Other: None IMPRESSION: No acute or traumatic finding. Atrophy and chronic small-vessel ischemic change, somewhat progressive since 2018. Electronically Signed   By: Nelson Chimes M.D.   On: 01/23/2022 10:05   CT Cervical Spine Wo Contrast  Result Date: 01/23/2022 CLINICAL DATA:  [place] from standing position. EXAM: CT CERVICAL SPINE WITHOUT CONTRAST TECHNIQUE: Multidetector CT imaging of the cervical spine was performed without intravenous contrast. Multiplanar CT image reconstructions were also generated. RADIATION DOSE REDUCTION: This exam was performed  according to the departmental dose-optimization program which includes automated exposure control, adjustment of the mA and/or kV according to patient size and/or use of iterative reconstruction technique. COMPARISON:  None FINDINGS: Alignment: No traumatic malalignment. Mild scoliotic curvature convex to the right. Skull base and vertebrae: No fracture or focal bone lesion. Soft tissues and spinal canal: No traumatic soft tissue finding. Disc levels: Ordinary osteoarthritis at the C1-2 articulation. Chronic spondylosis at C6-7 and C5-6. Chronic facet osteoarthritis with probable fusion of the facets at C4-5. No severe bony stenosis. Upper chest: Negative.  Apical scarring. Other: None IMPRESSION: No acute or traumatic finding. Ordinary chronic degenerative spondylosis and facet arthritis. Electronically Signed   By: Nelson Chimes M.D.   On: 01/23/2022 10:07   DG Hip Unilat W or Wo Pelvis 2-3 Views Left  Result Date: 01/23/2022 CLINICAL DATA:  Fall, pain EXAM: DG HIP (WITH OR WITHOUT PELVIS) 2-3V RIGHT; DG HIP (WITH OR WITHOUT PELVIS) 2-3V LEFT COMPARISON:  03/27/2017 FINDINGS: Osteopenia. No displaced fracture or dislocation of the bilateral hips or pelvis. Moderate, symmetric bilateral degenerative disease of the femoroacetabular joints. Nonobstructive pattern of overlying bowel gas. IMPRESSION: No displaced fracture or dislocation of the bilateral hips or pelvis. Please note that plain radiographs are significantly insensitive for hip and pelvic fracture, particularly in the setting of osteopenia. Consider CT or MRI to more sensitively evaluate for fracture if clinically suspected. Electronically Signed   By: Delanna Ahmadi M.D.   On: 01/23/2022 09:29   DG Hip Unilat W or Wo Pelvis 2-3 Views Right  Result Date: 01/23/2022 CLINICAL DATA:  Fall, pain EXAM: DG HIP (WITH OR WITHOUT PELVIS) 2-3V RIGHT; DG HIP (WITH OR WITHOUT PELVIS) 2-3V LEFT COMPARISON:  03/27/2017 FINDINGS: Osteopenia. No displaced fracture  or dislocation of the bilateral hips or pelvis. Moderate, symmetric bilateral degenerative disease of the femoroacetabular joints. Nonobstructive pattern of overlying bowel gas. IMPRESSION: No displaced fracture or dislocation of the bilateral hips or pelvis. Please note that plain radiographs are significantly insensitive for hip and pelvic fracture, particularly in the setting of osteopenia. Consider CT or  MRI to more sensitively evaluate for fracture if clinically suspected. Electronically Signed   By: Delanna Ahmadi M.D.   On: 01/23/2022 09:29    Procedures Procedures    Medications Ordered in ED Medications  cefTRIAXone (ROCEPHIN) 1 g in sodium chloride 0.9 % 100 mL IVPB (1 g Intravenous New Bag/Given 01/23/22 1019)    ED Course/ Medical Decision Making/ A&P Clinical Course as of 01/23/22 1036  Thu Jan 23, 2022  1003 Urinalysis reviewed interpreted and consistent with urinary tract infection with greater than 50 white blood cells and rare bacteria [DR]  4765 Complete metabolic panel is reviewed and interpreted and is significant for mild hyponatremia at 146, mild hypercalcemia at 10.4 otherwise within normal limits [DR]  1004 CBC reviewed interpreted within normal limits [DR]  1006 CT head reviewed and interpreted with no evidence of acute abnormalities and radiologist interpretation concurs [DR]  1017 No acute findings noted on cervical spine x-Trysta Showman [DR]  1018 Plain x-rays of the right and left hips with pelvic views reviewed interpreted no evidence of acute fracture radiologist, interpretation concurs [DR]    Clinical Course User Index [DR] Pattricia Boss, MD                           Medical Decision Making Fall Injuries from fall CT, plain x-rays, labs and urine ordered DDX- mechanical fall vs weakness, vs syncope By report c.w. mechanical fall-No other concern noted on work up athough at 31 and with her limited ability to communicate, could be missed Urine consistent with uti and  treated here with rocephin and will treat with keflex   FAl UTI- rocephin ordered Called Nicole [facility], listed as patient's guardian,  Amount and/or Complexity of Data Reviewed Labs: ordered. Decision-making details documented in ED Course. Radiology: ordered and independent interpretation performed. Decision-making details documented in ED Course. ECG/medicine tests: ordered and independent interpretation performed. Decision-making details documented in ED Course.  Risk Decision regarding hospitalization.   [place]- where patient has guardianship. Discussed [name] [phone]- number in chart is wron Patient at baseline per her report         Final Clinical Impression(s) / ED Diagnoses Final diagnoses:  Fall, initial encounter  Urinary tract infection without hematuria, site unspecified    Rx / DC Orders ED Discharge Orders          Ordered    cephALEXin (KEFLEX) 500 MG capsule  4 times daily        01/23/22 1035              Pattricia Boss, MD 01/23/22 1036

## 2022-01-23 NOTE — ED Notes (Signed)
Pt was able to stand without much assistance and she stated she usually ambulates with a  walker, but none were available in the department. Pt was assisted back to bed and repositioned, and placed back on CCM. No needs identified at this time.

## 2022-01-23 NOTE — ED Notes (Signed)
[facility] called at [phone]

## 2022-01-23 NOTE — ED Notes (Addendum)
Patient transported to XR. 

## 2022-01-23 NOTE — ED Notes (Signed)
Patient transported to CT 

## 2022-01-23 NOTE — ED Notes (Signed)
[facility] here to transport pt back to [place].

## 2022-01-26 ENCOUNTER — Emergency Department (HOSPITAL_COMMUNITY): Payer: Self-pay

## 2022-01-26 ENCOUNTER — Encounter (HOSPITAL_COMMUNITY): Payer: Self-pay | Admitting: Pharmacy Technician

## 2022-01-26 ENCOUNTER — Other Ambulatory Visit: Payer: Self-pay

## 2022-01-26 ENCOUNTER — Emergency Department (HOSPITAL_COMMUNITY)
Admission: EM | Admit: 2022-01-26 | Discharge: 2022-01-26 | Disposition: A | Discharge status: Skilled Nursing Facility | Payer: Self-pay | Attending: Emergency Medicine | Admitting: Emergency Medicine

## 2022-01-26 DIAGNOSIS — Y92129 Unspecified place in nursing home as the place of occurrence of the external cause: Secondary | ICD-10-CM | POA: Insufficient documentation

## 2022-01-26 DIAGNOSIS — W19XXXA Unspecified fall, initial encounter: Secondary | ICD-10-CM | POA: Insufficient documentation

## 2022-01-26 DIAGNOSIS — Z043 Encounter for examination and observation following other accident: Secondary | ICD-10-CM | POA: Insufficient documentation

## 2022-01-26 DIAGNOSIS — F0390 Unspecified dementia without behavioral disturbance: Secondary | ICD-10-CM | POA: Insufficient documentation

## 2022-01-26 LAB — I-STAT CHEM 8, ED
BUN: 19 mg/dL (ref 8–23)
Calcium, Ion: 1.26 mmol/L (ref 1.15–1.40)
Chloride: 106 mmol/L (ref 98–111)
Creatinine, Ser: 1 mg/dL (ref 0.44–1.00)
Glucose, Bld: 98 mg/dL (ref 70–99)
HCT: 40 % (ref 36.0–46.0)
Hemoglobin: 13.6 g/dL (ref 12.0–15.0)
Potassium: 3.4 mmol/L — ABNORMAL LOW (ref 3.5–5.1)
Sodium: 146 mmol/L — ABNORMAL HIGH (ref 135–145)
TCO2: 29 mmol/L (ref 22–32)

## 2022-01-26 LAB — URINE CULTURE: Culture: 80000 — AB

## 2022-01-26 NOTE — ED Provider Notes (Signed)
[facility] DEPT Provider Note   CSN: [id number] Arrival date & time: 01/26/22  0805     History  Chief Complaint  Patient presents with   Jamie [name] is a 88 y.o. female.  HPI     88 year old patient comes in with chief complaint of fall.  Level 5 caveat for dementia.  Patient is nonverbal, which is her baseline.  Patient resides at nursing home.  I called the nursing home myself and spoke with patient's nurse.  Patient has been at her baseline normal as far as her functional status is concerned and p.o. intake is concerned.  No fevers.  She is on UTI medications.  This morning she was found on the floor, next to her walker.  The fall was unwitnessed.  Patient is nonverbal and not providing any meaningful history.  Home Medications Prior to Admission medications   Medication Sig Start Date End Date Taking? Authorizing Provider  acetaminophen (TYLENOL) 325 MG suppository Place 650 mg rectally every 4 (four) hours as needed (oral temp over 101).    [provider]  acetaminophen (TYLENOL) 500 MG tablet Take 500 mg by mouth every 8 (eight) hours. At 0800, 1600 and 2400    [provider]  ALPRAZolam (XANAX) 0.25 MG tablet Take 0.25 mg by mouth 2 (two) times daily as needed for anxiety.    [provider]  ARIPiprazole (ABILIFY) 5 MG tablet Take 1 tablet (5 mg total) by mouth at bedtime. 06/24/15   Jamie Cousins, MD  busPIRone (BUSPAR) 5 MG tablet Take 5 mg by mouth 2 (two) times daily.     [provider]  calcium carbonate (OS-CAL - DOSED IN MG OF ELEMENTAL CALCIUM) 1250 (500 Ca) MG tablet Take 1 tablet by mouth 2 (two) times daily with a meal.    [provider]  cephALEXin (KEFLEX) 500 MG capsule Take 1 capsule (500 mg total) by mouth 4 (four) times daily. 01/23/22   Pattricia Boss, MD  furosemide (LASIX) 40 MG tablet Take 1 tablet (40 mg total) by mouth daily. 06/24/15   Jamie Cousins, MD  gabapentin (NEURONTIN) 100 MG capsule Take 1 capsule (100 mg total) by mouth at bedtime. 06/24/15   Jamie Cousins, MD  Hypromellose (ISOPTO TEARS) 0.5 % SOLN Apply 1 drop to eye 3 (three) times daily. One drop in each eye, three times daily. Patient taking differently: Place 1 drop into both eyes 3 (three) times daily. At 0800, 1400 and 2000 06/24/15   Jamie Cousins, MD  loperamide (IMODIUM) 2 MG capsule Take 2 mg by mouth as needed for diarrhea or loose stools. Not to exceed 6 doses in 24 hours    [provider]  magnesium oxide (MAG-OX) 400 MG tablet Take 400 mg by mouth daily.    [provider]  Melatonin 1 MG TABS Take 1 mg by mouth at bedtime.     [provider]  olopatadine (PATANOL) 0.1 % ophthalmic solution Place 1 drop into both eyes 2 (two) times daily. 06/24/15   Jamie Cousins, MD  oxymetazoline (AFRIN) 0.05 % nasal spray Place 2 sprays into both nostrils daily as needed (Nose Bleed).    [provider]  pantoprazole (PROTONIX) 40 MG tablet Take 1 tablet (40 mg total) by mouth 2 (two) times daily before a meal. Patient taking differently: Take 40 mg by mouth daily.  06/24/15   Jamie Cousins, MD  polyethylene  glycol (MIRALAX / GLYCOLAX) packet Take 17 g by mouth daily. 06/24/15   Jamie Cousins, MD  potassium chloride 20 MEQ/15ML (10%) SOLN Take 60 mEq by mouth daily.    [provider]  predniSONE (DELTASONE) 5 MG tablet Take 1 tablet (5 mg total) by mouth daily with breakfast. Patient taking differently: Take 10 mg by mouth daily with breakfast.  06/24/15   Jamie Cousins, MD  senna (SENOKOT) 8.6 MG TABS tablet Take 1 tablet (8.6 mg total) by mouth at bedtime. 06/24/15   Jamie Cousins, MD  sennosides-docusate sodium (SENOKOT-S) 8.6-50 MG tablet Take 1 tablet by mouth daily.    [provider]  sertraline (ZOLOFT) 50 MG tablet Take 1 tablet (50 mg total) by mouth daily.  06/24/15   Jamie Cousins, MD  traMADol (ULTRAM) 50 MG tablet Take 1 tablet (50 mg total) by mouth every 8 (eight) hours as needed (for pain). 05/01/17   Jamie Lis, MD      Allergies    Fluticasone and Pollen extract    Review of Systems   Review of Systems  Physical Exam Updated Vital Signs BP (!) 194/99   Pulse 61   Temp 98 F (36.7 C)   Resp 12   SpO2 100%  Physical Exam Vitals and nursing note reviewed.  Constitutional:      General: She is not in acute distress.    Appearance: She is well-developed.  HENT:     Head: Atraumatic.  Cardiovascular:     Rate and Rhythm: Normal rate.  Pulmonary:     Effort: Pulmonary effort is normal.  Musculoskeletal:     Cervical back: Neck supple.     Comments: Head to toe evaluation shows no hematoma, bleeding of the scalp, no facial abrasions, no spine step offs, crepitus of the chest or neck, no tenderness to palpation of the bilateral upper and lower extremities, no gross deformities, no chest tenderness, no pelvic pain.   Skin:    General: Skin is warm and dry.  Neurological:     Mental Status: Mental status is at baseline.    ED Results / Procedures / Treatments   Labs (all labs ordered are listed, but only abnormal results are displayed) Labs Reviewed  I-STAT CHEM 8, ED - Abnormal; Notable for the following components:      Result Value   Sodium 146 (*)    Potassium 3.4 (*)    All other components within normal limits    EKG None  Radiology CT Head Wo Contrast  Result Date: 01/26/2022 CLINICAL DATA:  Head and neck trauma from a fall. EXAM: CT HEAD WITHOUT CONTRAST CT CERVICAL SPINE WITHOUT CONTRAST TECHNIQUE: Multidetector CT imaging of the head and cervical spine was performed following the standard protocol without intravenous contrast. Multiplanar CT image reconstructions of the cervical spine were also generated. RADIATION DOSE REDUCTION: This exam was performed according to the departmental  dose-optimization program which includes automated exposure control, adjustment of the mA and/or kV according to patient size and/or use of iterative reconstruction technique. COMPARISON:  01/23/2022.  Cervical spine CT dated 03/16/2017. FINDINGS: CT HEAD FINDINGS Brain: Moderately enlarged ventricles and subarachnoid spaces. Moderate patchy white matter low density in both cerebral hemispheres. No intracranial hemorrhage, mass lesion or CT evidence of acute infarction. Vascular: No hyperdense vessel or unexpected calcification. Dense proximal basilar artery calcifications are noted. Skull: Normal. Negative for fracture or focal lesion. Sinuses/Orbits: Status post bilateral cataract extraction. Unremarkable bones and included  paranasal sinuses. Other: None. CT CERVICAL SPINE FINDINGS Alignment: Stable grade 1 anterolisthesis at the C4-5 and C7-T1 levels. No acute subluxations. Skull base and vertebrae: No acute fracture. No primary bone lesion or focal pathologic process. Soft tissues and spinal canal: No prevertebral fluid or swelling. No visible canal hematoma. Disc levels:  Stable multilevel degenerative changes. Upper chest: Interval visualization of a 3 mm oval, noncalcified nodule at the lateral aspect of the right lung apex. Tiny calcified granuloma in the posterior right upper lobe. Stable left apical pleural and parenchymal scarring. Other: Bilateral carotid artery calcifications. IMPRESSION: 1. No skull fracture or intracranial hemorrhage. 2. No cervical spine fracture or acute subluxation. 3. Stable diffuse cerebral atrophy and chronic small vessel white matter ischemic changes. 4. Stable cervical spine degenerative changes. 5. Dense basilar artery and bilateral carotid artery atheromatous calcifications. 6. Interval visualization of a 3 mm right upper lobe lung nodule. No follow-up needed if patient is low-risk.This recommendation follows the consensus statement: Guidelines for Management of Incidental  Pulmonary Nodules Detected on CT Images: From the Fleischner Society 2017; Radiology 2017; 284:228-243. Electronically Signed   By: Claudie Revering M.D.   On: 01/26/2022 09:34   CT Cervical Spine Wo Contrast  Result Date: 01/26/2022 CLINICAL DATA:  Head and neck trauma from a fall. EXAM: CT HEAD WITHOUT CONTRAST CT CERVICAL SPINE WITHOUT CONTRAST TECHNIQUE: Multidetector CT imaging of the head and cervical spine was performed following the standard protocol without intravenous contrast. Multiplanar CT image reconstructions of the cervical spine were also generated. RADIATION DOSE REDUCTION: This exam was performed according to the departmental dose-optimization program which includes automated exposure control, adjustment of the mA and/or kV according to patient size and/or use of iterative reconstruction technique. COMPARISON:  01/23/2022.  Cervical spine CT dated 03/16/2017. FINDINGS: CT HEAD FINDINGS Brain: Moderately enlarged ventricles and subarachnoid spaces. Moderate patchy white matter low density in both cerebral hemispheres. No intracranial hemorrhage, mass lesion or CT evidence of acute infarction. Vascular: No hyperdense vessel or unexpected calcification. Dense proximal basilar artery calcifications are noted. Skull: Normal. Negative for fracture or focal lesion. Sinuses/Orbits: Status post bilateral cataract extraction. Unremarkable bones and included paranasal sinuses. Other: None. CT CERVICAL SPINE FINDINGS Alignment: Stable grade 1 anterolisthesis at the C4-5 and C7-T1 levels. No acute subluxations. Skull base and vertebrae: No acute fracture. No primary bone lesion or focal pathologic process. Soft tissues and spinal canal: No prevertebral fluid or swelling. No visible canal hematoma. Disc levels:  Stable multilevel degenerative changes. Upper chest: Interval visualization of a 3 mm oval, noncalcified nodule at the lateral aspect of the right lung apex. Tiny calcified granuloma in the posterior  right upper lobe. Stable left apical pleural and parenchymal scarring. Other: Bilateral carotid artery calcifications. IMPRESSION: 1. No skull fracture or intracranial hemorrhage. 2. No cervical spine fracture or acute subluxation. 3. Stable diffuse cerebral atrophy and chronic small vessel white matter ischemic changes. 4. Stable cervical spine degenerative changes. 5. Dense basilar artery and bilateral carotid artery atheromatous calcifications. 6. Interval visualization of a 3 mm right upper lobe lung nodule. No follow-up needed if patient is low-risk.This recommendation follows the consensus statement: Guidelines for Management of Incidental Pulmonary Nodules Detected on CT Images: From the Fleischner Society 2017; Radiology 2017; 284:228-243. Electronically Signed   By: Claudie Revering M.D.   On: 01/26/2022 09:34    Procedures Procedures    Medications Ordered in ED Medications - No data to display  ED Course/ Medical Decision Making/ A&P  Medical Decision Making Amount and/or Complexity of Data Reviewed Radiology: ordered.    This patient presents to the ED with chief complaint(s) of unwitnessed fall with pertinent past medical history of advanced dementia, patient is nonverbal at baseline, and currently she is on UTI medications which further complicates the presenting complaint. The complaint involves an extensive differential diagnosis and also carries with it a high risk of complications and morbidity.    The differential diagnosis includes L DDx includes: - ICH - Fractures - Contusions - Soft tissue injury  -Severe electrolyte abnormality  The initial plan is to order i-STAT Chem-8, CT head and C-spine.  The lab ordered only because patient is presumably having infection.  We will to make sure she does not have any profound electrolyte abnormality.  She is nonverbal, which is significantly limiting her ability to gauge her wellness.  Additional history  obtained: Additional history obtained from nursing home/care facility Records reviewed  I reviewed patient's lab work-up from 5-25.  Her CMP was overall reassuring at that time.  Independent labs interpretation:  The following labs were independently interpreted: I-STAT Chem-8 that reveals no changes from the recent metabolic profile that was completed.  Independent visualization of imaging: - I independently visualized the following imaging with scope of interpretation limited to determining acute life threatening conditions related to emergency care: Ct brain, which revealed no evidence of acute brain bleed.  Treatment and Reassessment: Pt hasnt had any complains.  Work-up in the ER is reassuring.  I did call patient's guardian - she is affiliated with [employer] Phone goes to VM, no message left.   Final Clinical Impression(s) / ED Diagnoses Final diagnoses:  Fall, initial encounter    Rx / DC Orders ED Discharge Orders     None         Varney Biles, MD 01/26/22 [phone]

## 2022-01-26 NOTE — ED Triage Notes (Addendum)
Pt bib ems from [facility] after unwitnessed fall. No visible trauma. Per staff, pt at baseline, hx dementia, nonverbal. VSS with EMS. Not on anticoagulants. Pt currently being tx for UTI.

## 2022-01-26 NOTE — Discharge Instructions (Addendum)
We saw you in the ER after you had a fall. All the imaging results are normal, no fractures seen. No evidence of brain bleed. Labs look unchanged from recent workup. Please be very careful with walking, and do everything possible to prevent falls.

## 2022-01-27 ENCOUNTER — Telehealth: Payer: Self-pay | Admitting: Emergency Medicine

## 2022-01-27 NOTE — Telephone Encounter (Signed)
Post ED Visit - Positive Culture Follow-up  Culture report reviewed by antimicrobial stewardship pharmacist: [facility] '[]'$  Elenor Quinones, Pharm.D. '[]'$  Heide Guile, Pharm.D., BCPS AQ-ID '[]'$  Parks Neptune, Pharm.D., BCPS '[]'$  Alycia Rossetti, Pharm.D., BCPS '[]'$  [place], Pharm.D., BCPS, AAHIVP '[]'$  Legrand Como, Pharm.D., BCPS, AAHIVP '[]'$  Salome Arnt, PharmD, BCPS '[]'$  Johnnette Gourd, PharmD, BCPS '[]'$  Hughes Better, PharmD, BCPS '[]'$  Leeroy Cha, PharmD '[]'$  Laqueta Linden, PharmD, BCPS '[]'$  Albertina Parr, PharmD  [place] Team '[]'$  Leodis Sias, PharmD '[]'$  Lindell Spar, PharmD '[]'$  Royetta Asal, PharmD '[]'$  Graylin Shiver, Rph '[]'$  Rema Fendt) Glennon Mac, PharmD '[]'$  Arlyn Dunning, PharmD '[]'$  Netta Cedars, PharmD '[]'$  Dia Sitter, PharmD '[]'$  Leone Haven, PharmD '[]'$  Gretta Arab, PharmD '[]'$  Theodis Shove, PharmD '[]'$  Peggyann Juba, PharmD '[]'$  Reuel Boom, PharmD   Positive urine culture Treated with cephalexin, organism sensitive to the same and no further patient follow-up is required at this time.  Jamie [name] 01/27/2022, 10:34 AM

## 2022-02-09 ENCOUNTER — Emergency Department (HOSPITAL_COMMUNITY)
Admission: EM | Admit: 2022-02-09 | Discharge: 2022-02-09 | Disposition: A | Discharge status: Home / Self Care | Payer: Self-pay | Attending: Emergency Medicine | Admitting: Emergency Medicine

## 2022-02-09 ENCOUNTER — Emergency Department (HOSPITAL_COMMUNITY): Payer: Self-pay

## 2022-02-09 ENCOUNTER — Other Ambulatory Visit: Payer: Self-pay

## 2022-02-09 DIAGNOSIS — W1830XA Fall on same level, unspecified, initial encounter: Secondary | ICD-10-CM | POA: Insufficient documentation

## 2022-02-09 DIAGNOSIS — F0390 Unspecified dementia without behavioral disturbance: Secondary | ICD-10-CM | POA: Insufficient documentation

## 2022-02-09 DIAGNOSIS — I10 Essential (primary) hypertension: Secondary | ICD-10-CM | POA: Insufficient documentation

## 2022-02-09 DIAGNOSIS — W19XXXA Unspecified fall, initial encounter: Secondary | ICD-10-CM

## 2022-02-09 DIAGNOSIS — S0003XA Contusion of scalp, initial encounter: Secondary | ICD-10-CM | POA: Insufficient documentation

## 2022-02-09 NOTE — ED Provider Notes (Signed)
[facility] EMERGENCY DEPARTMENT Provider Note   CSN: [id number] Arrival date & time: 02/09/22  8588     History  Chief Complaint  Patient presents with   Fall   Head Injury    Jamie [name] is a 88 y.o. female.   Fall  Head Injury Patient presents after a fall.  She has baseline dementia.  History provided by EMS.  EMS reports fall this morning with swelling to left side of head.  Per chart review, medical history includes HLD, anxiety, HTN, PUD, dementia, delusions, prior alcohol dependence, depression.  She resides in a nursing facility.  She was seen in the emergency department twice late last month for separate falls.  History per staff at nursing facility: Patient had a witnessed fall from standing.  It appeared to be mechanical due to loss of balance.  She did not lose consciousness.  She fell sideways onto a hard surface floor and did strike her head.       Home Medications Prior to Admission medications   Medication Sig Start Date End Date Taking? Authorizing Provider  acetaminophen (TYLENOL) 325 MG suppository Place 650 mg rectally every 4 (four) hours as needed (oral temp over 101).    [provider]  acetaminophen (TYLENOL) 500 MG tablet Take 500 mg by mouth every 8 (eight) hours. At 0800, 1600 and 2400    [provider]  ALPRAZolam (XANAX) 0.25 MG tablet Take 0.25 mg by mouth 2 (two) times daily as needed for anxiety.    [provider]  ARIPiprazole (ABILIFY) 5 MG tablet Take 1 tablet (5 mg total) by mouth at bedtime. 06/24/15   Charlynne Cousins, MD  busPIRone (BUSPAR) 5 MG tablet Take 5 mg by mouth 2 (two) times daily.     [provider]  calcium carbonate (OS-CAL - DOSED IN MG OF ELEMENTAL CALCIUM) 1250 (500 Ca) MG tablet Take 1 tablet by mouth 2 (two) times daily with a meal.    [provider]  cephALEXin (KEFLEX) 500 MG capsule Take 1 capsule (500 mg total) by mouth 4 (four) times daily.  01/23/22   Pattricia Boss, MD  furosemide (LASIX) 40 MG tablet Take 1 tablet (40 mg total) by mouth daily. 06/24/15   Charlynne Cousins, MD  gabapentin (NEURONTIN) 100 MG capsule Take 1 capsule (100 mg total) by mouth at bedtime. 06/24/15   Charlynne Cousins, MD  Hypromellose (ISOPTO TEARS) 0.5 % SOLN Apply 1 drop to eye 3 (three) times daily. One drop in each eye, three times daily. Patient taking differently: Place 1 drop into both eyes 3 (three) times daily. At 0800, 1400 and 2000 06/24/15   Charlynne Cousins, MD  loperamide (IMODIUM) 2 MG capsule Take 2 mg by mouth as needed for diarrhea or loose stools. Not to exceed 6 doses in 24 hours    [provider]  magnesium oxide (MAG-OX) 400 MG tablet Take 400 mg by mouth daily.    [provider]  Melatonin 1 MG TABS Take 1 mg by mouth at bedtime.     [provider]  olopatadine (PATANOL) 0.1 % ophthalmic solution Place 1 drop into both eyes 2 (two) times daily. 06/24/15   Charlynne Cousins, MD  oxymetazoline (AFRIN) 0.05 % nasal spray Place 2 sprays into both nostrils daily as needed (Nose Bleed).    [provider]  pantoprazole (PROTONIX) 40 MG tablet Take 1 tablet (40 mg total) by mouth 2 (two) times  daily before a meal. Patient taking differently: Take 40 mg by mouth daily.  06/24/15   Charlynne Cousins, MD  polyethylene glycol [facility] / Floria Raveling) packet Take 17 g by mouth daily. 06/24/15   Charlynne Cousins, MD  potassium chloride 20 MEQ/15ML (10%) SOLN Take 60 mEq by mouth daily.    [provider]  predniSONE (DELTASONE) 5 MG tablet Take 1 tablet (5 mg total) by mouth daily with breakfast. Patient taking differently: Take 10 mg by mouth daily with breakfast.  06/24/15   Charlynne Cousins, MD  senna (SENOKOT) 8.6 MG TABS tablet Take 1 tablet (8.6 mg total) by mouth at bedtime. 06/24/15   Charlynne Cousins, MD  sennosides-docusate sodium (SENOKOT-S) 8.6-50 MG tablet Take 1  tablet by mouth daily.    [provider]  sertraline (ZOLOFT) 50 MG tablet Take 1 tablet (50 mg total) by mouth daily. 06/24/15   Charlynne Cousins, MD  traMADol (ULTRAM) 50 MG tablet Take 1 tablet (50 mg total) by mouth every 8 (eight) hours as needed (for pain). 05/01/17   Robbie Lis, MD      Allergies    Fluticasone and Pollen extract    Review of Systems   Review of Systems  Unable to perform ROS: Dementia    Physical Exam Updated Vital Signs BP (!) 149/104 (BP Location: Right Arm)   Pulse 84   Temp 98.7 F (37.1 C) (Oral)   Resp 16   SpO2 94%  Physical Exam Vitals and nursing note reviewed.  Constitutional:      General: She is not in acute distress.    Appearance: She is well-developed and normal weight. She is not ill-appearing, toxic-appearing or diaphoretic.  HENT:     Head:     Comments: Hematoma to left side of scalp    Right Ear: External ear normal.     Left Ear: External ear normal.     Nose: Nose normal.     Mouth/Throat:     Mouth: Mucous membranes are moist.     Pharynx: Oropharynx is clear.  Eyes:     Extraocular Movements: Extraocular movements intact.     Conjunctiva/sclera: Conjunctivae normal.  Cardiovascular:     Rate and Rhythm: Normal rate and regular rhythm.     Heart sounds: No murmur heard. Pulmonary:     Effort: Pulmonary effort is normal. No respiratory distress.     Breath sounds: Normal breath sounds. No wheezing or rales.  Chest:     Chest wall: No tenderness.  Abdominal:     Palpations: Abdomen is soft.     Tenderness: There is no abdominal tenderness.  Musculoskeletal:        General: No swelling, tenderness, deformity or signs of injury. Normal range of motion.     Cervical back: Normal range of motion and neck supple. No rigidity or tenderness.     Right lower leg: No edema.     Left lower leg: No edema.  Skin:    General: Skin is warm and dry.     Capillary Refill: Capillary refill takes less than 2 seconds.      Coloration: Skin is not jaundiced or pale.  Neurological:     General: No focal deficit present.     Mental Status: She is alert. Mental status is at baseline.  Psychiatric:        Mood and Affect: Mood normal.     ED Results / Procedures / Treatments  Labs (all labs ordered are listed, but only abnormal results are displayed) Labs Reviewed - No data to display  EKG None  Radiology CT Head Wo Contrast  Result Date: 02/09/2022 CLINICAL DATA:  Head and neck trauma. Fall today with hematoma on the left head. History of dementia. EXAM: CT HEAD WITHOUT CONTRAST CT CERVICAL SPINE WITHOUT CONTRAST TECHNIQUE: Multidetector CT imaging of the head and cervical spine was performed following the standard protocol without intravenous contrast. Multiplanar CT image reconstructions of the cervical spine were also generated. RADIATION DOSE REDUCTION: This exam was performed according to the departmental dose-optimization program which includes automated exposure control, adjustment of the mA and/or kV according to patient size and/or use of iterative reconstruction technique. COMPARISON:  CT head and cervical spine 01/26/2022 FINDINGS: CT HEAD FINDINGS The study is mildly motion degraded despite repeat imaging. Brain: There is no evidence of an acute infarct, intracranial hemorrhage, mass, midline shift, or extra-axial fluid collection. Patchy and confluent hypodensities in the cerebral white matter bilaterally are unchanged and nonspecific but compatible with extensive chronic small vessel ischemic disease. There is cerebral atrophy with advanced bilateral mesial temporal lobe volume loss. Vascular: Calcified atherosclerosis at the skull base. No hyperdense vessel. Skull: No definite acute fracture within limitations of motion artifact. Sinuses/Orbits: New mild-to-moderate mucosal thickening in the paranasal sinuses bilaterally, greatest in the ethmoid air cells. Clear mastoid air cells. Right cataract  extraction. Other: Moderate-sized left parietal scalp hematoma. CT CERVICAL SPINE FINDINGS The study is mildly motion degraded. Alignment: Unchanged minimal anterolisthesis of C4 on C5, C5 on C6, and C7 on T1. Skull base and vertebrae: No acute fracture identified within limitations of motion artifact. No destructive osseous process. Soft tissues and spinal canal: No prevertebral fluid or swelling. No visible canal hematoma. Disc levels: Diffuse cervical disc degeneration, greatest at C5-6 where there is severe disc space narrowing. Multilevel facet arthrosis, severe in the mid and upper cervical spine. Bilateral facet ankylosis at C4-5. Upper chest: Mild scarring in the left lung apex. Other: Heavily calcified atherosclerotic plaque at the carotid bifurcations. IMPRESSION: 1. No evidence of acute intracranial abnormality. 2. Left parietal scalp hematoma. 3. Extensive chronic small vessel ischemic disease. 4. Mildly motion degraded cervical spine CT without evidence of acute fracture. Electronically Signed   By: Logan Bores M.D.   On: 02/09/2022 10:19   CT Cervical Spine Wo Contrast  Result Date: 02/09/2022 CLINICAL DATA:  Head and neck trauma. Fall today with hematoma on the left head. History of dementia. EXAM: CT HEAD WITHOUT CONTRAST CT CERVICAL SPINE WITHOUT CONTRAST TECHNIQUE: Multidetector CT imaging of the head and cervical spine was performed following the standard protocol without intravenous contrast. Multiplanar CT image reconstructions of the cervical spine were also generated. RADIATION DOSE REDUCTION: This exam was performed according to the departmental dose-optimization program which includes automated exposure control, adjustment of the mA and/or kV according to patient size and/or use of iterative reconstruction technique. COMPARISON:  CT head and cervical spine 01/26/2022 FINDINGS: CT HEAD FINDINGS The study is mildly motion degraded despite repeat imaging. Brain: There is no evidence of an  acute infarct, intracranial hemorrhage, mass, midline shift, or extra-axial fluid collection. Patchy and confluent hypodensities in the cerebral white matter bilaterally are unchanged and nonspecific but compatible with extensive chronic small vessel ischemic disease. There is cerebral atrophy with advanced bilateral mesial temporal lobe volume loss. Vascular: Calcified atherosclerosis at the skull base. No hyperdense vessel. Skull: No definite acute fracture within limitations of motion artifact. Sinuses/Orbits: New  mild-to-moderate mucosal thickening in the paranasal sinuses bilaterally, greatest in the ethmoid air cells. Clear mastoid air cells. Right cataract extraction. Other: Moderate-sized left parietal scalp hematoma. CT CERVICAL SPINE FINDINGS The study is mildly motion degraded. Alignment: Unchanged minimal anterolisthesis of C4 on C5, C5 on C6, and C7 on T1. Skull base and vertebrae: No acute fracture identified within limitations of motion artifact. No destructive osseous process. Soft tissues and spinal canal: No prevertebral fluid or swelling. No visible canal hematoma. Disc levels: Diffuse cervical disc degeneration, greatest at C5-6 where there is severe disc space narrowing. Multilevel facet arthrosis, severe in the mid and upper cervical spine. Bilateral facet ankylosis at C4-5. Upper chest: Mild scarring in the left lung apex. Other: Heavily calcified atherosclerotic plaque at the carotid bifurcations. IMPRESSION: 1. No evidence of acute intracranial abnormality. 2. Left parietal scalp hematoma. 3. Extensive chronic small vessel ischemic disease. 4. Mildly motion degraded cervical spine CT without evidence of acute fracture. Electronically Signed   By: Logan Bores M.D.   On: 02/09/2022 10:19    Procedures Procedures    Medications Ordered in ED Medications - No data to display  ED Course/ Medical Decision Making/ A&P                           Medical Decision Making  This patient  presents to the ED for concern of fall, this involves an extensive number of treatment options, and is a complaint that carries with it a high risk of complications and morbidity.  The differential diagnosis includes skull fracture, ICH, concussion, C-spine injury   Co morbidities that complicate the patient evaluation  HLD, anxiety, HTN, PUD, dementia, delusions, prior alcohol dependence, depression   Additional history obtained:  Additional history obtained from staff at nursing facility External records from outside source obtained and reviewed including EMR   Imaging Studies ordered:  I ordered imaging studies including CT of head and cervical spine I independently visualized and interpreted imaging which showed left scalp hematoma with no other acute findings I agree with the radiologist interpretation   Problem List / ED Course / Critical interventions / Medication management  Patient is a 88 year old female with history of dementia, presenting from skilled nursing facility for a witnessed fall.  Fall was described as mechanical.  Patient fell to her left side striking her head on the hard surface floor.  She did not lose consciousness.  Per chart review, patient has had previous ED visits for similar falls.  Prior to being bedded in the ED, patient underwent CT imaging of head and cervical spine.  Results showed no acute intracranial abnormality or cervical spine injury.  On assessment, patient resting comfortably on ED stretcher.  She does have a hematoma to her left scalp.  Skin is intact.  Patient has no areas of tenderness in thorax, abdomen, or extremities.  She has no focal neurologic deficits.  I spoke with staff at her nursing facility to confirm history and patient's mental baseline.  Staff was informed that patient will be discharged back to facility.  I attempted to contact the patient's legal guardian but was unable to obtain a telephone answer.  Patient was discharged in  stable condition.    Social Determinants of Health:  Has dementia and lives in nursing facility         Final Clinical Impression(s) / ED Diagnoses Final diagnoses:  Fall, initial encounter  Hematoma of scalp, initial encounter  Rx / DC Orders ED Discharge Orders     None         Godfrey Pick, MD 02/09/22 [phone]

## 2022-02-09 NOTE — ED Triage Notes (Addendum)
EMS stated, she fell this morning, has a hematoma to left side of head. Has dementia. Pupils are pinpoint and non reactive.  Does not carry on a conversation.  Pt. Is nonverbal and will sometimes speak [place]

## 2022-05-25 ENCOUNTER — Inpatient Hospital Stay (HOSPITAL_COMMUNITY)
Admission: EM | Admit: 2022-05-25 | Discharge: 2022-06-01 | DRG: 291 | Disposition: A | Discharge status: Hospice | Payer: Medicare Other | Source: Skilled Nursing Facility | Attending: Internal Medicine | Admitting: Internal Medicine

## 2022-05-25 ENCOUNTER — Observation Stay (HOSPITAL_BASED_OUTPATIENT_CLINIC_OR_DEPARTMENT_OTHER): Payer: Medicare Other

## 2022-05-25 ENCOUNTER — Emergency Department (HOSPITAL_COMMUNITY): Payer: Medicare Other

## 2022-05-25 ENCOUNTER — Encounter (HOSPITAL_COMMUNITY): Payer: Self-pay

## 2022-05-25 DIAGNOSIS — I350 Nonrheumatic aortic (valve) stenosis: Secondary | ICD-10-CM

## 2022-05-25 DIAGNOSIS — J9601 Acute respiratory failure with hypoxia: Secondary | ICD-10-CM | POA: Diagnosis present

## 2022-05-25 DIAGNOSIS — I110 Hypertensive heart disease with heart failure: Principal | ICD-10-CM | POA: Diagnosis present

## 2022-05-25 DIAGNOSIS — Z8249 Family history of ischemic heart disease and other diseases of the circulatory system: Secondary | ICD-10-CM

## 2022-05-25 DIAGNOSIS — Z90710 Acquired absence of both cervix and uterus: Secondary | ICD-10-CM

## 2022-05-25 DIAGNOSIS — Z515 Encounter for palliative care: Secondary | ICD-10-CM

## 2022-05-25 DIAGNOSIS — Z79891 Long term (current) use of opiate analgesic: Secondary | ICD-10-CM

## 2022-05-25 DIAGNOSIS — I5033 Acute on chronic diastolic (congestive) heart failure: Secondary | ICD-10-CM | POA: Diagnosis present

## 2022-05-25 DIAGNOSIS — I10 Essential (primary) hypertension: Secondary | ICD-10-CM | POA: Diagnosis present

## 2022-05-25 DIAGNOSIS — Z6822 Body mass index (BMI) 22.0-22.9, adult: Secondary | ICD-10-CM

## 2022-05-25 DIAGNOSIS — R6889 Other general symptoms and signs: Secondary | ICD-10-CM

## 2022-05-25 DIAGNOSIS — R627 Adult failure to thrive: Secondary | ICD-10-CM | POA: Diagnosis present

## 2022-05-25 DIAGNOSIS — H01003 Unspecified blepharitis right eye, unspecified eyelid: Secondary | ICD-10-CM | POA: Diagnosis present

## 2022-05-25 DIAGNOSIS — E873 Alkalosis: Secondary | ICD-10-CM | POA: Diagnosis present

## 2022-05-25 DIAGNOSIS — N179 Acute kidney failure, unspecified: Secondary | ICD-10-CM | POA: Diagnosis present

## 2022-05-25 DIAGNOSIS — Z79899 Other long term (current) drug therapy: Secondary | ICD-10-CM

## 2022-05-25 DIAGNOSIS — R9431 Abnormal electrocardiogram [ECG] [EKG]: Secondary | ICD-10-CM

## 2022-05-25 DIAGNOSIS — Z9181 History of falling: Secondary | ICD-10-CM

## 2022-05-25 DIAGNOSIS — S52124A Nondisplaced fracture of head of right radius, initial encounter for closed fracture: Secondary | ICD-10-CM | POA: Diagnosis present

## 2022-05-25 DIAGNOSIS — Z7952 Long term (current) use of systemic steroids: Secondary | ICD-10-CM

## 2022-05-25 DIAGNOSIS — Z66 Do not resuscitate: Secondary | ICD-10-CM | POA: Diagnosis present

## 2022-05-25 DIAGNOSIS — Z90722 Acquired absence of ovaries, bilateral: Secondary | ICD-10-CM

## 2022-05-25 DIAGNOSIS — G9340 Encephalopathy, unspecified: Secondary | ICD-10-CM | POA: Diagnosis present

## 2022-05-25 DIAGNOSIS — E870 Hyperosmolality and hypernatremia: Secondary | ICD-10-CM | POA: Diagnosis present

## 2022-05-25 DIAGNOSIS — F32A Depression, unspecified: Secondary | ICD-10-CM | POA: Diagnosis present

## 2022-05-25 DIAGNOSIS — H01006 Unspecified blepharitis left eye, unspecified eyelid: Secondary | ICD-10-CM | POA: Diagnosis present

## 2022-05-25 DIAGNOSIS — E669 Obesity, unspecified: Secondary | ICD-10-CM | POA: Diagnosis present

## 2022-05-25 DIAGNOSIS — Z20822 Contact with and (suspected) exposure to covid-19: Secondary | ICD-10-CM | POA: Diagnosis present

## 2022-05-25 DIAGNOSIS — I509 Heart failure, unspecified: Secondary | ICD-10-CM

## 2022-05-25 DIAGNOSIS — Z83438 Family history of other disorder of lipoprotein metabolism and other lipidemia: Secondary | ICD-10-CM

## 2022-05-25 DIAGNOSIS — J309 Allergic rhinitis, unspecified: Secondary | ICD-10-CM | POA: Diagnosis present

## 2022-05-25 DIAGNOSIS — G8929 Other chronic pain: Secondary | ICD-10-CM | POA: Diagnosis present

## 2022-05-25 DIAGNOSIS — E785 Hyperlipidemia, unspecified: Secondary | ICD-10-CM | POA: Diagnosis present

## 2022-05-25 DIAGNOSIS — F03918 Unspecified dementia, unspecified severity, with other behavioral disturbance: Secondary | ICD-10-CM | POA: Diagnosis present

## 2022-05-25 DIAGNOSIS — Z87891 Personal history of nicotine dependence: Secondary | ICD-10-CM

## 2022-05-25 DIAGNOSIS — G9341 Metabolic encephalopathy: Secondary | ICD-10-CM | POA: Diagnosis present

## 2022-05-25 DIAGNOSIS — Z888 Allergy status to other drugs, medicaments and biological substances status: Secondary | ICD-10-CM

## 2022-05-25 DIAGNOSIS — R296 Repeated falls: Secondary | ICD-10-CM | POA: Diagnosis present

## 2022-05-25 LAB — ECHOCARDIOGRAM COMPLETE
AR max vel: 0.34 cm2
AV Area VTI: 0.29 cm2
AV Area mean vel: 0.38 cm2
AV Mean grad: 85 mmHg
AV Peak grad: 139.7 mmHg
Ao pk vel: 5.91 m/s
Area-P 1/2: 4.04 cm2
MV VTI: 1.33 cm2
S' Lateral: 3.1 cm
Single Plane A4C EF: 38.9 %

## 2022-05-25 LAB — I-STAT VENOUS BLOOD GAS, ED
Acid-Base Excess: 2 mmol/L (ref 0.0–2.0)
Bicarbonate: 26.9 mmol/L (ref 20.0–28.0)
Calcium, Ion: 1.31 mmol/L (ref 1.15–1.40)
HCT: 36 % (ref 36.0–46.0)
Hemoglobin: 12.2 g/dL (ref 12.0–15.0)
O2 Saturation: 99 %
Potassium: 4 mmol/L (ref 3.5–5.1)
Sodium: 145 mmol/L (ref 135–145)
TCO2: 28 mmol/L (ref 22–32)
pCO2, Ven: 44.5 mmHg (ref 44–60)
pH, Ven: 7.39 (ref 7.25–7.43)
pO2, Ven: 133 mmHg — ABNORMAL HIGH (ref 32–45)

## 2022-05-25 LAB — COMPREHENSIVE METABOLIC PANEL
ALT: 67 U/L — ABNORMAL HIGH (ref 0–44)
AST: 40 U/L (ref 15–41)
Albumin: 3.3 g/dL — ABNORMAL LOW (ref 3.5–5.0)
Alkaline Phosphatase: 80 U/L (ref 38–126)
Anion gap: 8 (ref 5–15)
BUN: 31 mg/dL — ABNORMAL HIGH (ref 8–23)
CO2: 25 mmol/L (ref 22–32)
Calcium: 10 mg/dL (ref 8.9–10.3)
Chloride: 112 mmol/L — ABNORMAL HIGH (ref 98–111)
Creatinine, Ser: 1.23 mg/dL — ABNORMAL HIGH (ref 0.44–1.00)
GFR, Estimated: 42 mL/min — ABNORMAL LOW (ref >60)
Glucose, Bld: 86 mg/dL (ref 70–99)
Potassium: 4 mmol/L (ref 3.5–5.1)
Sodium: 145 mmol/L (ref 135–145)
Total Bilirubin: 1 mg/dL (ref 0.3–1.2)
Total Protein: 5.8 g/dL — ABNORMAL LOW (ref 6.5–8.1)

## 2022-05-25 LAB — CBC WITH DIFFERENTIAL/PLATELET
Abs Immature Granulocytes: 0.01 10*3/uL (ref 0.00–0.07)
Basophils Absolute: 0 10*3/uL (ref 0.0–0.1)
Basophils Relative: 0 %
Eosinophils Absolute: 0 10*3/uL (ref 0.0–0.5)
Eosinophils Relative: 1 %
HCT: 39.1 % (ref 36.0–46.0)
Hemoglobin: 11.9 g/dL — ABNORMAL LOW (ref 12.0–15.0)
Immature Granulocytes: 0 %
Lymphocytes Relative: 11 %
Lymphs Abs: 0.9 10*3/uL (ref 0.7–4.0)
MCH: 28.3 pg (ref 26.0–34.0)
MCHC: 30.4 g/dL (ref 30.0–36.0)
MCV: 93.1 fL (ref 80.0–100.0)
Monocytes Absolute: 0.6 10*3/uL (ref 0.1–1.0)
Monocytes Relative: 7 %
Neutro Abs: 6.6 10*3/uL (ref 1.7–7.7)
Neutrophils Relative %: 81 %
Platelets: 295 10*3/uL (ref 150–400)
RBC: 4.2 MIL/uL (ref 3.87–5.11)
RDW: 15.9 % — ABNORMAL HIGH (ref 11.5–15.5)
WBC: 8.1 10*3/uL (ref 4.0–10.5)
nRBC: 0 % (ref 0.0–0.2)

## 2022-05-25 LAB — BRAIN NATRIURETIC PEPTIDE: B Natriuretic Peptide: 4250.6 pg/mL — ABNORMAL HIGH (ref 0.0–100.0)

## 2022-05-25 LAB — PROTIME-INR
INR: 1.9 — ABNORMAL HIGH (ref 0.8–1.2)
Prothrombin Time: 22 seconds — ABNORMAL HIGH (ref 11.4–15.2)

## 2022-05-25 LAB — RAPID URINE DRUG SCREEN, HOSP PERFORMED
Amphetamines: NOT DETECTED
Barbiturates: NOT DETECTED
Benzodiazepines: NOT DETECTED
Cocaine: NOT DETECTED
Opiates: NOT DETECTED
Tetrahydrocannabinol: NOT DETECTED

## 2022-05-25 LAB — URINALYSIS, ROUTINE W REFLEX MICROSCOPIC
Bacteria, UA: NONE SEEN
Bilirubin Urine: NEGATIVE
Glucose, UA: NEGATIVE mg/dL
Ketones, ur: NEGATIVE mg/dL
Nitrite: NEGATIVE
Protein, ur: 30 mg/dL — AB
Specific Gravity, Urine: 1.018 (ref 1.005–1.030)
pH: 6 (ref 5.0–8.0)

## 2022-05-25 LAB — LACTIC ACID, PLASMA
Lactic Acid, Venous: 1.1 mmol/L (ref 0.5–1.9)
Lactic Acid, Venous: 1.1 mmol/L (ref 0.5–1.9)

## 2022-05-25 LAB — RESP PANEL BY RT-PCR (FLU A&B, COVID) ARPGX2
Influenza A by PCR: NEGATIVE
Influenza B by PCR: NEGATIVE
SARS Coronavirus 2 by RT PCR: NEGATIVE

## 2022-05-25 MED ORDER — IOHEXOL 350 MG/ML SOLN
35.0000 mL | Freq: Once | INTRAVENOUS | Status: AC | PRN
  Administered 2022-05-25: 35 mL via INTRAVENOUS

## 2022-05-25 MED ORDER — FUROSEMIDE 10 MG/ML IJ SOLN
40.0000 mg | Freq: Once | INTRAMUSCULAR | Status: AC
  Administered 2022-05-25: 40 mg via INTRAVENOUS
  Filled 2022-05-25: qty 4

## 2022-05-25 MED ORDER — ACETAMINOPHEN 650 MG RE SUPP: 650.0000 mg | Freq: Four times a day (QID) | RECTAL | Status: DC | PRN

## 2022-05-25 MED ORDER — SODIUM CHLORIDE 0.9 % IV BOLUS: 1000.0000 mL | Freq: Once | INTRAVENOUS | Status: DC

## 2022-05-25 MED ORDER — IOHEXOL 350 MG/ML SOLN: 50.0000 mL | Freq: Once | INTRAVENOUS | Status: DC | PRN

## 2022-05-25 MED ORDER — ENOXAPARIN SODIUM 40 MG/0.4ML IJ SOSY
40.0000 mg | PREFILLED_SYRINGE | INTRAMUSCULAR | Status: DC
  Administered 2022-05-25 – 2022-05-28 (×3): 40 mg via SUBCUTANEOUS
  Filled 2022-05-25 (×3): qty 0.4

## 2022-05-25 MED ORDER — ACETAMINOPHEN 325 MG PO TABS: 650.0000 mg | ORAL_TABLET | Freq: Four times a day (QID) | ORAL | Status: DC | PRN

## 2022-05-25 NOTE — Progress Notes (Signed)
Echocardiogram 2D Echocardiogram has been performed.   Oneal Deputy Trystan Akhtar RDCS 05/25/2022, 3:58 PM

## 2022-05-25 NOTE — Plan of Care (Signed)
  Problem: Education: Goal: Knowledge of General Education information will improve Description: Including pain rating scale, medication(s)/side effects and non-pharmacologic comfort measures Outcome: Progressing   Problem: Education: Goal: Knowledge of General Education information will improve Description: Including pain rating scale, medication(s)/side effects and non-pharmacologic comfort measures Outcome: Progressing   Problem: Health Behavior/Discharge Planning: Goal: Ability to manage health-related needs will improve Outcome: Progressing   Problem: Clinical Measurements: Goal: Ability to maintain clinical measurements within normal limits will improve Outcome: Progressing   Problem: Clinical Measurements: Goal: Will remain free from infection Outcome: Progressing

## 2022-05-25 NOTE — ED Notes (Signed)
Patient transported to CT 

## 2022-05-25 NOTE — ED Provider Notes (Signed)
[facility] EMERGENCY DEPARTMENT Provider Note  CSN: [id number] Arrival date & time: 05/25/22 1006  Chief Complaint(s) Altered Mental Status, Shortness of Breath, and Weakness  HPI Jamie [name] is a 88 y.o. female with history of dementia, prior history of mild CHF, aortic stenosis, nonverbal at baseline presenting to the emergency department with new oxygen requirement and decreased activity.  Apparently at baseline the patient is able to wander around her skilled nursing facility and eat independently.  The past 2 days she has not done this.  EMS was called apparently due to hypoxia, on their arrival patient was 78 percent on room air.  They placed the patient on oxygen and brought her to the emergency department.  History significantly limited due to dementia  Past Medical History Past Medical History:  Diagnosis Date   Anxiety    Chronic pain    Dementia ([place])    Depression    Gastric ulcer 05/2011   Hyperlipidemia    Hypertension    Obesity    5'3"   Patient Active Problem List   Diagnosis Date Noted   Acute encephalopathy 05/25/2022   Obstipation 04/30/2017   Aortic stenosis 06/22/2015   Hypotension 06/21/2015   Chest pain 06/21/2015   Anemia associated with acute blood loss    AKI (acute kidney injury) ([place])    Tachypnea    Hypoxia 04/18/2015   Acute respiratory failure with hypoxia and hypercapnia ([place]) 04/17/2015   Acute respiratory failure with hypoxia ([place]) 04/17/2015   HCAP (healthcare-associated pneumonia) 04/17/2015   Acute-on-chronic kidney injury ([place]) 04/17/2015   Anemia 04/17/2015   Sepsis ([place]) 04/17/2015   Cellulitis of leg, right 01/01/2015   Acute blood loss anemia 01/01/2015   Elevated troponin 01/01/2015   Hypokalemia 01/01/2015   Depression 01/01/2015   GI bleed 12/27/2014   Bleeding gastrointestinal    Hemorrhagic shock (HCC)    Alcohol dependence with uncomplicated withdrawal ([place])    Alcohol dependence ([place]) 09/30/2014    Dementia with behavioral disturbance ([place]) 09/29/2014   Delusions ([place]) 09/29/2014   Dementia ([place]) 12/13/2013   GI bleeding 08/12/2012   Obesity    Chronic pain    Gastric ulcer 05/03/2011   Bladder spasm 02/10/2011   Murmur, cardiac 11/20/2010   SHOULDER PAIN, RIGHT 04/15/2010   LOC OSTEOARTHROS NOT SPEC PRIM/SEC LOWER LEG 04/10/2010   CALF PAIN, RIGHT 03/01/2010   CERVICAL STRAIN, ACUTE 02/25/2010   ANXIETY STATE, UNSPECIFIED 06/20/2009   SEBACEOUS CYST, INFECTED 06/20/2009   ALLERGIC RHINITIS DUE TO OTHER ALLERGEN 11/17/2008   TACHYCARDIA 05/16/2008   CONTUSION OF BREAST 05/05/2008   HYPERCALCEMIA 01/20/2008   EPISTAXIS 12/30/2007   VITAMIN B12 DEFICIENCY 11/23/2007   APHTHOUS ULCERS 11/23/2007   ASYMPTOMATIC POSTMENOPAUSAL STATUS 08/18/2007   Essential hypertension 05/10/2007   ADVEF, DRUG/MEDICINAL/BIOLOGICAL SUBST NOS 05/10/2007   Hyperlipemia 02/23/2007   Home Medication(s) Prior to Admission medications   Medication Sig Start Date End Date Taking? Authorizing Provider  acetaminophen (TYLENOL) 500 MG tablet Take 500 mg by mouth every 8 (eight) hours as needed for mild pain or moderate pain.   Yes [provider]  ARIPiprazole (ABILIFY) 5 MG tablet Take 1 tablet (5 mg total) by mouth at bedtime. 06/24/15  Yes Charlynne Cousins, MD  busPIRone (BUSPAR) 5 MG tablet Take 5 mg by mouth 2 (two) times daily.    Yes [provider]  calcium carbonate (OS-CAL - DOSED IN MG OF ELEMENTAL CALCIUM) 1250 (500 Ca) MG tablet Take 1 tablet by mouth  2 (two) times daily with a meal.   Yes [provider]  Cholecalciferol (VITAMIN D3) 50 MCG (2000 UT) TABS Take 2,000 Units by mouth daily.   Yes [provider]  gabapentin (NEURONTIN) 100 MG capsule Take 1 capsule (100 mg total) by mouth at bedtime. 06/24/15  Yes Charlynne Cousins, MD  Hypromellose (ISOPTO TEARS) 0.5 % SOLN Apply 1 drop to eye 3 (three) times daily. One drop in each eye, three times  daily. Patient taking differently: Place 1 drop into both eyes 3 (three) times daily. At 0800, 1400 and 2000 06/24/15  Yes Charlynne Cousins, MD  magnesium oxide (MAG-OX) 400 MG tablet Take 400 mg by mouth daily.   Yes [provider]  Melatonin 1 MG TABS Take 1 mg by mouth at bedtime.    Yes [provider]  oxymetazoline (AFRIN) 0.05 % nasal spray Place 2 sprays into both nostrils daily as needed (Nose Bleed).   Yes [provider]  pantoprazole (PROTONIX) 40 MG tablet Take 1 tablet (40 mg total) by mouth 2 (two) times daily before a meal. Patient taking differently: Take 40 mg by mouth daily. 06/24/15  Yes Charlynne Cousins, MD  polyethylene glycol [facility] / Floria Raveling) packet Take 17 g by mouth daily. 06/24/15  Yes Charlynne Cousins, MD  senna (SENOKOT) 8.6 MG TABS tablet Take 1 tablet (8.6 mg total) by mouth at bedtime. 06/24/15  Yes Charlynne Cousins, MD  sertraline (ZOLOFT) 50 MG tablet Take 1 tablet (50 mg total) by mouth daily. 06/24/15  Yes Charlynne Cousins, MD  sodium chloride (DEEP SEA NASAL SPRAY) 0.65 % nasal spray Place 2 sprays into the nose 2 (two) times daily.   Yes [provider]  Zinc Oxide (BAZA PROTECT MOISTURE BARRIER) 12 % CREA Apply 1 Application topically daily as needed (redness or irritation). Apply topically to peri area every day as needed for redness or irritation.   Yes [provider]  acetaminophen (TYLENOL) 325 MG suppository Place 650 mg rectally every 4 (four) hours as needed (oral temp over 101).    [provider]  ALPRAZolam Duanne Moron) 0.25 MG tablet Take 0.25 mg by mouth 2 (two) times daily as needed for anxiety.    [provider]  cephALEXin (KEFLEX) 500 MG capsule Take 1 capsule (500 mg total) by mouth 4 (four) times daily. 01/23/22   Pattricia Boss, MD  furosemide (LASIX) 40 MG tablet Take 1 tablet (40 mg total) by mouth daily. 06/24/15   Charlynne Cousins, MD  loperamide  (IMODIUM) 2 MG capsule Take 2 mg by mouth as needed for diarrhea or loose stools. Not to exceed 6 doses in 24 hours    [provider]  olopatadine (PATANOL) 0.1 % ophthalmic solution Place 1 drop into both eyes 2 (two) times daily. 06/24/15   Charlynne Cousins, MD  potassium chloride 20 MEQ/15ML (10%) SOLN Take 60 mEq by mouth daily.    [provider]  predniSONE (DELTASONE) 5 MG tablet Take 1 tablet (5 mg total) by mouth daily with breakfast. Patient taking differently: Take 10 mg by mouth daily with breakfast.  06/24/15   Charlynne Cousins, MD  sennosides-docusate sodium (SENOKOT-S) 8.6-50 MG tablet Take 1 tablet by mouth daily.    [provider]  traMADol (ULTRAM) 50 MG tablet Take 1 tablet (50 mg total) by mouth every 8 (eight) hours as needed (for pain). 05/01/17   Robbie Lis, MD  Past Surgical History Past Surgical History:  Procedure Laterality Date   ABDOMINAL HYSTERECTOMY     PARTIAL   BILATERAL OOPHORECTOMY     ESOPHAGOGASTRODUODENOSCOPY  08/12/2012   Procedure: ESOPHAGOGASTRODUODENOSCOPY (EGD);  Surgeon: Arta Silence, MD;  Location: [facility];  Service: Endoscopy;  Laterality: Left;   ESOPHAGOGASTRODUODENOSCOPY (EGD) WITH PROPOFOL N/A 12/28/2014   Procedure: ESOPHAGOGASTRODUODENOSCOPY (EGD) WITH PROPOFOL;  Surgeon: Wilford Corner, MD;  Location: [facility];  Service: Endoscopy;  Laterality: N/A;   skin biopsy     nose lesion, noncancerous   Family History Family History  Problem Relation Age of Onset   Hypertension Mother    Hyperlipidemia Mother    Heart disease Father        69s   Cancer Brother     Social History Social History   Tobacco Use   Smoking status: Former    Packs/day: 1.00    Types: Cigarettes    Quit date: 09/02/1971    Years since quitting: 50.7   Smokeless tobacco: Never   Vaping Use   Vaping Use: Never used  Substance Use Topics   Alcohol use: Not Currently    Comment: occasionally   Drug use: No   Allergies Fluticasone and Pollen extract  Review of Systems Review of Systems  Unable to perform ROS: Patient nonverbal    Physical Exam Vital Signs  I have reviewed the triage vital signs BP 129/70   Pulse 84   Temp 98.7 F (37.1 C) (Rectal)   Resp (!) 24   SpO2 97%  Physical Exam Constitutional:      Comments: Occasionally moaning, opens eyes to painful stimulus and looks around, then closes eyes.  HENT:     Head: Normocephalic and atraumatic.  Eyes:     Pupils: Pupils are equal, round, and reactive to light.     Comments: Bilateral blepharitis  Cardiovascular:     Rate and Rhythm: Normal rate and regular rhythm.  Pulmonary:     Comments: Mild tachypnea, mild increased work of breathing, bilateral crackles Chest:     Chest wall: No deformity or crepitus.  Abdominal:     Palpations: Abdomen is soft.     Tenderness: There is no abdominal tenderness.  Musculoskeletal:     Cervical back: Neck supple.     Right lower leg: No edema.     Left lower leg: No edema.  Skin:    General: Skin is warm and dry.     Capillary Refill: Capillary refill takes less than 2 seconds.     Comments: Skin tear noted on the right elbow  Neurological:     Mental Status: She is disoriented.     Comments: Does not follow commands, no spontaneous speech, localizes pain with bilateral upper extremities, withdraws from pain in the lower extremities.    Psychiatric:     Comments: Unable to assess due to dementia     ED Results and Treatments Labs (all labs ordered are listed, but only abnormal results are displayed) Labs Reviewed  COMPREHENSIVE METABOLIC PANEL - Abnormal; Notable for the following components:      Result Value   Chloride 112 (*)    BUN 31 (*)    Creatinine, Ser 1.23 (*)    Total Protein 5.8 (*)    Albumin 3.3 (*)    ALT 67 (*)    GFR,  Estimated 42 (*)    All other components within normal limits  CBC WITH DIFFERENTIAL/PLATELET - Abnormal; Notable for the following components:  Hemoglobin 11.9 (*)    RDW 15.9 (*)    All other components within normal limits  URINALYSIS, ROUTINE W REFLEX MICROSCOPIC - Abnormal; Notable for the following components:   Hgb urine dipstick MODERATE (*)    Protein, ur 30 (*)    Leukocytes,Ua TRACE (*)    All other components within normal limits  PROTIME-INR - Abnormal; Notable for the following components:   Prothrombin Time 22.0 (*)    INR 1.9 (*)    All other components within normal limits  BRAIN NATRIURETIC PEPTIDE - Abnormal; Notable for the following components:   B Natriuretic Peptide 4,250.6 (*)    All other components within normal limits  I-STAT VENOUS BLOOD GAS, ED - Abnormal; Notable for the following components:   pO2, Ven 133 (*)    All other components within normal limits  RESP PANEL BY RT-PCR (FLU A&B, COVID) ARPGX2  CULTURE, BLOOD (ROUTINE X 2)  CULTURE, BLOOD (ROUTINE X 2)  URINE CULTURE  LACTIC ACID, PLASMA  LACTIC ACID, PLASMA  RAPID URINE DRUG SCREEN, HOSP PERFORMED  TSH                                                                                                                          Radiology CT Angio Chest PE W/Cm &/Or Wo Cm  Result Date: 05/25/2022 CLINICAL DATA:  Pulmonary embolism (PE) suspected, unknown D-dimer. Hypoxia. EXAM: CT ANGIOGRAPHY CHEST WITH CONTRAST TECHNIQUE: Multidetector CT imaging of the chest was performed using the standard protocol during bolus administration of intravenous contrast. Multiplanar CT image reconstructions and MIPs were obtained to evaluate the vascular anatomy. RADIATION DOSE REDUCTION: This exam was performed according to the departmental dose-optimization program which includes automated exposure control, adjustment of the mA and/or kV according to patient size and/or use of iterative reconstruction technique.  CONTRAST:  55m OMNIPAQUE IOHEXOL 350 MG/ML SOLN COMPARISON:  Chest radiograph from earlier today. FINDINGS: Cardiovascular: The study is moderate quality for the evaluation of pulmonary embolism, with significant motion degradation. There are no filling defects in the central, lobar, segmental or subsegmental pulmonary artery branches to suggest acute pulmonary embolism. Atherosclerotic thoracic aorta with dilated 4.1 cm ascending thoracic aorta. Dilated main pulmonary artery (4.0 cm diameter). Mild cardiomegaly. No significant pericardial fluid/thickening. Diffuse thickening and coarse calcification of the aortic valve. Three-vessel coronary atherosclerosis. Mediastinum/Nodes: No significant thyroid nodules. Unremarkable esophagus. No pathologically enlarged axillary, mediastinal or hilar lymph nodes. Lungs/Pleura: No pneumothorax. Moderate dependent bilateral pleural effusions. Moderate passive atelectasis in the dependent lower lobes bilaterally. Diffuse interlobular septal thickening. No lung masses or significant pulmonary nodules in the aerated portions of the lungs. Upper abdomen: Large hiatal hernia. Simple 2.9 cm left liver dome cyst. Nonobstructing 11 mm upper right renal stone. Musculoskeletal: No aggressive appearing focal osseous lesions. Moderate thoracic spondylosis. Review of the MIP images confirms the above findings. IMPRESSION: 1. Motion degraded scan. No evidence of acute pulmonary embolism. 2. Cardiomegaly. Diffuse interlobular septal thickening compatible with cardiogenic pulmonary  edema. 3. Moderate dependent bilateral pleural effusions. Moderate passive atelectasis in the dependent lower lobes bilaterally. 4. Dilated main pulmonary artery, suggesting pulmonary arterial hypertension. 5. Large hiatal hernia. 6. Nonobstructing right nephrolithiasis. 7. Aortic Atherosclerosis (ICD10-I70.0). Electronically Signed   By: Ilona Sorrel M.D.   On: 05/25/2022 13:19   CT Head Wo Contrast  Result  Date: 05/25/2022 CLINICAL DATA:  Pain after trauma EXAM: CT HEAD WITHOUT CONTRAST CT CERVICAL SPINE WITHOUT CONTRAST TECHNIQUE: Multidetector CT imaging of the head and cervical spine was performed following the standard protocol without intravenous contrast. Multiplanar CT image reconstructions of the cervical spine were also generated. RADIATION DOSE REDUCTION: This exam was performed according to the departmental dose-optimization program which includes automated exposure control, adjustment of the mA and/or kV according to patient size and/or use of iterative reconstruction technique. COMPARISON:  February 09, 2022 CT scan of the brain and cervical spine. FINDINGS: CT HEAD FINDINGS Brain: No subdural, epidural, or subarachnoid hemorrhage. White matter changes are stable. No acute cortical ischemia or infarct. No mass effect or midline shift. Ventricles and sulci are stable. Cerebellum, brainstem, and basal cisterns are within normal limits. Vascular: Calcified atherosclerotic change in the intracranial carotids. Skull: Normal. Negative for fracture or focal lesion. Sinuses/Orbits: Fluid in the posterior left mastoid air cells without bony erosion is a stable finding. Cerumen is identified in both external auditory canals. The middle ears are well aerated. Other: None. CT CERVICAL SPINE FINDINGS Alignment: Normal. Skull base and vertebrae: No acute fracture. No primary bone lesion or focal pathologic process. Soft tissues and spinal canal: No prevertebral fluid or swelling. No visible canal hematoma. Disc levels: Multilevel degenerative disc disease and facet degenerative changes. Upper chest: Bilateral pleural effusions, left greater than right, are partially imaged. Other: No other abnormalities. IMPRESSION: 1. No acute intracranial abnormalities. 2. No fracture or traumatic malalignment in the cervical spine. Degenerative changes. 3. Bilateral pleural effusions. 4. Both external auditory canals are occluded with  cerumen. Electronically Signed   By: Dorise Bullion III M.D.   On: 05/25/2022 12:46   CT Cervical Spine Wo Contrast  Result Date: 05/25/2022 CLINICAL DATA:  Pain after trauma EXAM: CT HEAD WITHOUT CONTRAST CT CERVICAL SPINE WITHOUT CONTRAST TECHNIQUE: Multidetector CT imaging of the head and cervical spine was performed following the standard protocol without intravenous contrast. Multiplanar CT image reconstructions of the cervical spine were also generated. RADIATION DOSE REDUCTION: This exam was performed according to the departmental dose-optimization program which includes automated exposure control, adjustment of the mA and/or kV according to patient size and/or use of iterative reconstruction technique. COMPARISON:  February 09, 2022 CT scan of the brain and cervical spine. FINDINGS: CT HEAD FINDINGS Brain: No subdural, epidural, or subarachnoid hemorrhage. White matter changes are stable. No acute cortical ischemia or infarct. No mass effect or midline shift. Ventricles and sulci are stable. Cerebellum, brainstem, and basal cisterns are within normal limits. Vascular: Calcified atherosclerotic change in the intracranial carotids. Skull: Normal. Negative for fracture or focal lesion. Sinuses/Orbits: Fluid in the posterior left mastoid air cells without bony erosion is a stable finding. Cerumen is identified in both external auditory canals. The middle ears are well aerated. Other: None. CT CERVICAL SPINE FINDINGS Alignment: Normal. Skull base and vertebrae: No acute fracture. No primary bone lesion or focal pathologic process. Soft tissues and spinal canal: No prevertebral fluid or swelling. No visible canal hematoma. Disc levels: Multilevel degenerative disc disease and facet degenerative changes. Upper chest: Bilateral pleural effusions, left greater than  right, are partially imaged. Other: No other abnormalities. IMPRESSION: 1. No acute intracranial abnormalities. 2. No fracture or traumatic malalignment  in the cervical spine. Degenerative changes. 3. Bilateral pleural effusions. 4. Both external auditory canals are occluded with cerumen. Electronically Signed   By: Dorise Bullion III M.D.   On: 05/25/2022 12:46   DG Elbow Complete Right  Result Date: 05/25/2022 CLINICAL DATA:  Elbow pain and swelling. EXAM: RIGHT ELBOW - COMPLETE 3+ VIEW COMPARISON:  None Available. FINDINGS: Elevation of the anterior fat pad is consistent with an elbow joint effusion. Suboptimal evaluation due to nonstandard positioning, however possible nondisplaced radial head fracture is seen on the oblique view. Generalized osteopenia also noted. No evidence of dislocation. IMPRESSION: Elbow joint effusion. Suboptimal evaluation due to nonstandard positioning, however nondisplaced fracture of radial head is suspected. Electronically Signed   By: Marlaine Hind M.D.   On: 05/25/2022 12:08   DG Chest Port 1 View  Result Date: 05/25/2022 CLINICAL DATA:  Hypoxia. EXAM: PORTABLE CHEST 1 VIEW COMPARISON:  One-view chest x-ray 04/24/2015. FINDINGS: Patient is somewhat rotated to the right. The heart is enlarged. Moderate diffuse edema is present. Left greater than right pleural effusions are present. Bibasilar airspace opacities likely reflect atelectasis. No significant consolidation is present in the upper lungs. Remote left-sided rib fractures are noted. IMPRESSION: 1. Cardiomegaly with moderate diffuse edema and bilateral pleural effusions compatible with congestive heart failure. 2. Bibasilar airspace disease likely reflects atelectasis. Electronically Signed   By: San Morelle M.D.   On: 05/25/2022 12:02    Pertinent labs & imaging results that were available during my care of the patient were reviewed by me and considered in my medical decision making (see MDM for details).  Medications Ordered in ED Medications  iohexol (OMNIPAQUE) 350 MG/ML injection 50 mL (has no administration in time range)  enoxaparin (LOVENOX)  injection 40 mg (has no administration in time range)  acetaminophen (TYLENOL) tablet 650 mg (has no administration in time range)    Or  acetaminophen (TYLENOL) suppository 650 mg (has no administration in time range)  iohexol (OMNIPAQUE) 350 MG/ML injection 35 mL (35 mLs Intravenous Contrast Given 05/25/22 1306)  furosemide (LASIX) injection 40 mg (40 mg Intravenous Given 05/25/22 1439)                                                                                                                                     Procedures Procedures  (including critical care time)  Medical Decision Making / ED Course   MDM:  88 year old female presenting to the emergency department with new oxygen requirement.  History severely limited due to dementia.  Exam with bilateral crackles, otherwise no peripheral edema.  Patient afebrile in the emergency department.  Not tachycardic.  Blood pressure reassuring.  Noted to have a skin tear on right elbow , x-ray shows possible radial head fracture, will place in sling.  Given signs of  trauma, obtained head CT and CT neck without evidence of acute intracranial process.  Chest x-ray demonstrating cardiomegaly and pulmonary edema, obtained CT angiography of the chest which shows bilateral lower lung effusions with pulmonary edema.  BNP very elevated.  Suspect volume overload although patient does not have an obvious exam for this.  Lower concern for pneumonia.  No wheezing to suggest emphysema or COPD.  No pneumothorax.  No sign of other acute process on work-up so far.  Discussed with medicine team who admit the patient.     Additional history obtained: -Additional history obtained from ems -External records from outside source obtained and reviewed including: Chart review including previous notes, labs, imaging, consultation notes   Lab Tests: -I ordered, reviewed, and interpreted labs.   The pertinent results include:   Labs Reviewed  COMPREHENSIVE  METABOLIC PANEL - Abnormal; Notable for the following components:      Result Value   Chloride 112 (*)    BUN 31 (*)    Creatinine, Ser 1.23 (*)    Total Protein 5.8 (*)    Albumin 3.3 (*)    ALT 67 (*)    GFR, Estimated 42 (*)    All other components within normal limits  CBC WITH DIFFERENTIAL/PLATELET - Abnormal; Notable for the following components:   Hemoglobin 11.9 (*)    RDW 15.9 (*)    All other components within normal limits  URINALYSIS, ROUTINE W REFLEX MICROSCOPIC - Abnormal; Notable for the following components:   Hgb urine dipstick MODERATE (*)    Protein, ur 30 (*)    Leukocytes,Ua TRACE (*)    All other components within normal limits  PROTIME-INR - Abnormal; Notable for the following components:   Prothrombin Time 22.0 (*)    INR 1.9 (*)    All other components within normal limits  BRAIN NATRIURETIC PEPTIDE - Abnormal; Notable for the following components:   B Natriuretic Peptide 4,250.6 (*)    All other components within normal limits  I-STAT VENOUS BLOOD GAS, ED - Abnormal; Notable for the following components:   pO2, Ven 133 (*)    All other components within normal limits  RESP PANEL BY RT-PCR (FLU A&B, COVID) ARPGX2  CULTURE, BLOOD (ROUTINE X 2)  CULTURE, BLOOD (ROUTINE X 2)  URINE CULTURE  LACTIC ACID, PLASMA  LACTIC ACID, PLASMA  RAPID URINE DRUG SCREEN, HOSP PERFORMED  TSH      EKG   EKG Interpretation  Date/Time:  Sunday May 25 2022 10:26:14 EDT Ventricular Rate:  84 PR Interval:  197 QRS Duration: 107 QT Interval:  380 QTC Calculation: 450 R Axis:   27 Text Interpretation: Sinus rhythm LVH with secondary repolarization abnormality Anterior infarct, old Confirmed by Garnette Gunner [phone]) on 05/25/2022 10:46:57 AM         Imaging Studies ordered: I ordered imaging studies including CXR, CTA chest, XR elbow, CT head/neck On my interpretation imaging demonstrates no acute head/neck injury, R elbow effusion. Pulm edema and b/l  effusion I independently visualized and interpreted imaging. I agree with the radiologist interpretation   Medicines ordered and prescription drug management: Meds ordered this encounter  Medications   DISCONTD: sodium chloride 0.9 % bolus 1,000 mL   iohexol (OMNIPAQUE) 350 MG/ML injection 50 mL   iohexol (OMNIPAQUE) 350 MG/ML injection 35 mL   furosemide (LASIX) injection 40 mg   enoxaparin (LOVENOX) injection 40 mg   OR Linked Order Group    acetaminophen (TYLENOL) tablet 650 mg  acetaminophen (TYLENOL) suppository 650 mg    -I have reviewed the patients home medicines and have made adjustments as needed   Consultations Obtained: I requested consultation with the hospitalist,  and discussed lab and imaging findings as well as pertinent plan - they recommend: admission   Cardiac Monitoring: The patient was maintained on a cardiac monitor.  I personally viewed and interpreted the cardiac monitored which showed an underlying rhythm of: NSR  Social Determinants of Health:  Factors impacting patients care include: ward of the state   Reevaluation: After the interventions noted above, I reevaluated the patient and found that they have improved  Co morbidities that complicate the patient evaluation  Past Medical History:  Diagnosis Date   Anxiety    Chronic pain    Dementia ([place])    Depression    Gastric ulcer 05/2011   Hyperlipidemia    Hypertension    Obesity    5'3"      Dispostion: Admit    Final Clinical Impression(s) / ED Diagnoses Final diagnoses:  Acute respiratory failure with hypoxia ([place])  Acute on chronic congestive heart failure, unspecified heart failure type ([place])  Total self-care deficit     This chart was dictated using voice recognition software.  Despite best efforts to proofread,  errors can occur which can change the documentation meaning.    Cristie Hem, MD 05/25/22 1525

## 2022-05-25 NOTE — ED Triage Notes (Signed)
Pt coming from [place] with [facility]. Facility reports that pt was gasping for air. Facility reports that pt has been declining over the past few days, not walking around the facility as she usually does, no oral intake for 3 days. Pt is nonverbal at baseline. EMS reports pt SPO2 74% on room air upon their arrival. Pt currently on 2 L at 95%.   BP 149/85 HR 82 RR 22 SPO2 94%  2L CBG 109

## 2022-05-25 NOTE — TOC Initial Note (Addendum)
Transition of Care [facility]) - Initial/Assessment Note    Patient Details  Name: Jamie [name] MRN: [id number] Date of Birth: 24-May-1930  Transition of [facility]) CM/SW Contact:    Verdell Carmine, RN Phone Number: 05/25/2022, 11:40 AM  Clinical Narrative:                 Patient from [facility], has a Legal Guardian- [name] . The patient had [facility], she is nonverbal normally. She has not been eating  for three days, seems to be on a decline according to the NH.  Attempted to call number listed for [name], voicemail full, texted confidential to ask her to call ASAP to CM. Decisions may need to be made regarding to treatment modalities, patient is listed as a full code at this time.Recommend palliative consult for Goals of care.  Spoke to the staff at [facility], she is a DNR, they have paperwork. The staff member confirmed that she has been declining not eating or drinking. No other number available for guardian.  Provider aware of informaiton   Barriers to Discharge: Family Issues   Patient Goals and CMS Choice        Expected Discharge Plan and Services                                                Prior Living Arrangements/Services                       Activities of Daily Living      Permission Sought/Granted                  Emotional Assessment              Admission diagnosis:  Shortness of Breath Patient Active Problem List   Diagnosis Date Noted   Obstipation 04/30/2017   Aortic stenosis 06/22/2015   Hypotension 06/21/2015   Chest pain 06/21/2015   Anemia associated with acute blood loss    AKI (acute kidney injury) ([place])    Tachypnea    Hypoxia 04/18/2015   Acute respiratory failure with hypoxia and hypercapnia ([place]) 04/17/2015   Acute respiratory failure with hypoxia ([place]) 04/17/2015   HCAP (healthcare-associated pneumonia) 04/17/2015   Acute-on-chronic kidney injury ([place]) 04/17/2015   Anemia 04/17/2015    Sepsis ([place]) 04/17/2015   Cellulitis of leg, right 01/01/2015   Acute blood loss anemia 01/01/2015   Elevated troponin 01/01/2015   Hypokalemia 01/01/2015   Depression 01/01/2015   GI bleed 12/27/2014   Bleeding gastrointestinal    Hemorrhagic shock (HCC)    Alcohol dependence with uncomplicated withdrawal ([place])    Alcohol dependence ([place]) 09/30/2014   Dementia with behavioral disturbance ([place]) 09/29/2014   Delusions ([place]) 09/29/2014   Dementia ([place]) 12/13/2013   GI bleeding 08/12/2012   Obesity    Chronic pain    Gastric ulcer 05/03/2011   Bladder spasm 02/10/2011   Murmur, cardiac 11/20/2010   SHOULDER PAIN, RIGHT 04/15/2010   LOC OSTEOARTHROS NOT SPEC PRIM/SEC LOWER LEG 04/10/2010   CALF PAIN, RIGHT 03/01/2010   CERVICAL STRAIN, ACUTE 02/25/2010   ANXIETY STATE, UNSPECIFIED 06/20/2009   SEBACEOUS CYST, INFECTED 06/20/2009   ALLERGIC RHINITIS DUE TO OTHER ALLERGEN 11/17/2008   TACHYCARDIA 05/16/2008   CONTUSION OF BREAST 05/05/2008   HYPERCALCEMIA 01/20/2008   EPISTAXIS 12/30/2007  VITAMIN B12 DEFICIENCY 11/23/2007   APHTHOUS ULCERS 11/23/2007   ASYMPTOMATIC POSTMENOPAUSAL STATUS 08/18/2007   Essential hypertension 05/10/2007   ADVEF, DRUG/MEDICINAL/BIOLOGICAL SUBST NOS 05/10/2007   Hyperlipemia 02/23/2007   PCP:  Megan Mans, NP Pharmacy:   [facility] [address], [place] - [address] [place] [address] Phone: [phone] Fax: [phone]     Social Determinants of Health (SDOH) Interventions    Readmission Risk Interventions     No data to display

## 2022-05-25 NOTE — H&P (Addendum)
Date: 05/25/2022               Patient Name:  Jamie [name] MRN: [id number]  DOB: [birth year] Age / Sex: 88 y.o., female   PCP: Megan Mans, NP         Medical Service: Internal Medicine Teaching Service         Attending Physician: Dr. Aldine Contes, MD    First Contact: Dr. Roswell Nickel, MD       Pager: [phone]      Second Contact: Lorine Bears     Pager: Liliane Shi [phone]        After Hours (After 5p/  First Contact Pager: [phone]  weekends / holidays): Second Contact Pager: [phone]   SUBJECTIVE   Chief Complaint: Shortness of Breath   History of Present Illness: This is a 88 year old female with a past medical history of HFrEF with most recent echocardiogram on 06/21/2025 with ejection fraction of 45 to 50%, anxiety, aortic stenosis, chronic pain, dementia, HTN, and HLD resenting to the emergency room with altered mental status and shortness of breath.  Of note, patient is nonverbal, and unable to speak to me during my exam.  History is obtained from facility, and ED staff.  [name] notes that the patient has been declining for the past 3 days.  Patient has had poor oral intake for the past 3 days.  Normally, the patient walks around the facility, but for the last 3 days she has not been walking around.  Today, the patient was found gasping for air at the facility with SPO2 in the 70s on room air.  Patient was then transported to the emergency room where she was put on 2 L of oxygen, and oxygen saturations came up to 95%.  The patient does not have any family and has a legal guardian through [employer] Social [employer].  No other history could be provided, as patient is not able to speak to me.  Of note, facility does family that the patient speaks [place], but given clinical condition currently, patient was unable to communicate with me.  ED Course: In the emergency room, patient presented initially with vital signs of 98.7 F, pulse 82,  respirations rate 20, blood pressure 154/105 satting at 96% with 2 L nasal cannula.  Initial chest x-ray showed bilateral pleural effusions.  Initial right elbow x-ray showing nondisplaced radial head fracture.  CT chest showed bilateral dependent pleural effusions.  CT head and neck negative for any intracranial abnormalities or any acute cervical spine fractures.  Initial UA showing trace leukocytes.  BNP elevated in the 4000's.  No white count noted.  Hemoglobin at 11.9.  No medications were administered in the emergency room.  Blood culture and urine culture pending.   Meds:  Current Meds  Medication Sig   acetaminophen (TYLENOL) 500 MG tablet Take 500 mg by mouth every 8 (eight) hours as needed for mild pain or moderate pain.   ARIPiprazole (ABILIFY) 5 MG tablet Take 1 tablet (5 mg total) by mouth at bedtime.   busPIRone (BUSPAR) 5 MG tablet Take 5 mg by mouth 2 (two) times daily.    calcium carbonate (OS-CAL - DOSED IN MG OF ELEMENTAL CALCIUM) 1250 (500 Ca) MG tablet Take 1 tablet by mouth 2 (two) times daily with a meal.   Cholecalciferol (VITAMIN D3) 50 MCG (2000 UT) TABS Take 2,000 Units by mouth daily.   gabapentin (NEURONTIN) 100 MG capsule Take 1 capsule (100 mg total)  by mouth at bedtime.   Hypromellose (ISOPTO TEARS) 0.5 % SOLN Apply 1 drop to eye 3 (three) times daily. One drop in each eye, three times daily. (Patient taking differently: Place 1 drop into both eyes 3 (three) times daily. At 0800, 1400 and 2000)   magnesium oxide (MAG-OX) 400 MG tablet Take 400 mg by mouth daily.   Melatonin 1 MG TABS Take 1 mg by mouth at bedtime.    oxymetazoline (AFRIN) 0.05 % nasal spray Place 2 sprays into both nostrils daily as needed (Nose Bleed).   pantoprazole (PROTONIX) 40 MG tablet Take 1 tablet (40 mg total) by mouth 2 (two) times daily before a meal. (Patient taking differently: Take 40 mg by mouth daily.)   polyethylene glycol (MIRALAX / GLYCOLAX) packet Take 17 g by mouth daily.   senna  (SENOKOT) 8.6 MG TABS tablet Take 1 tablet (8.6 mg total) by mouth at bedtime.   sertraline (ZOLOFT) 50 MG tablet Take 1 tablet (50 mg total) by mouth daily.   sodium chloride (DEEP SEA NASAL SPRAY) 0.65 % nasal spray Place 2 sprays into the nose 2 (two) times daily.   Zinc Oxide (BAZA PROTECT MOISTURE BARRIER) 12 % CREA Apply 1 Application topically daily as needed (redness or irritation). Apply topically to peri area every day as needed for redness or irritation.    Past Medical History  Past Surgical History:  Procedure Laterality Date   ABDOMINAL HYSTERECTOMY     PARTIAL   BILATERAL OOPHORECTOMY     ESOPHAGOGASTRODUODENOSCOPY  08/12/2012   Procedure: ESOPHAGOGASTRODUODENOSCOPY (EGD);  Surgeon: Arta Silence, MD;  Location: [facility];  Service: Endoscopy;  Laterality: Left;   ESOPHAGOGASTRODUODENOSCOPY (EGD) WITH PROPOFOL N/A 12/28/2014   Procedure: ESOPHAGOGASTRODUODENOSCOPY (EGD) WITH PROPOFOL;  Surgeon: Wilford Corner, MD;  Location: [facility];  Service: Endoscopy;  Laterality: N/A;   skin biopsy     nose lesion, noncancerous    Social:  Lives at [name] Occupation: Retired Support: No family Level of Function: Dependent for all ADLs and IADLs PCP: PCP is Megan Mans, NP Substances: History of alcohol use, unable to quantify  Family History: N/A   Allergies: Allergies as of 05/25/2022 - Review Complete 05/25/2022  Allergen Reaction Noted   Fluticasone Other (See Comments) 03/15/2018   Pollen extract Other (See Comments) 07/24/2014    Review of Systems: A complete ROS was negative except as per HPI.   OBJECTIVE:   Physical Exam: Blood pressure 133/82, pulse 87, temperature 98.7 F (37.1 C), temperature source Rectal, resp. rate 14, SpO2 97 %.  Constitutional: Ill-appearing, not cooperative, but arousal HENT: Normocephalic, atraumatic, dry mucous membranes noted.  Foul odor from mouth. Eyes: Left eye with erythema and drainage  noted Cardiovascular: Regular rate and rhythm.  Grade 3/6 systolic murmur best heard at right sternal border Pulmonary/Chest: Tachypneic, with decreased lung sounds to bilateral lower bases. Abdominal: soft, non-tender, non-distended MSK: normal bulk and tone, trace edema noted to bilateral lower extremities.  2+ pedal pulses.  Right elbow with edema noted. Neurological: Not alert, not oriented, nonverbal.  Patient is arousable to sternal rub. Skin: warm and dry  Labs: CBC    Component Value Date/Time   WBC 8.1 05/25/2022 1037   RBC 4.20 05/25/2022 1037   HGB 12.2 05/25/2022 1117   HCT 36.0 05/25/2022 1117   PLT 295 05/25/2022 1037   MCV 93.1 05/25/2022 1037   MCH 28.3 05/25/2022 1037   MCHC 30.4 05/25/2022 1037   RDW 15.9 (H) 05/25/2022 1037  LYMPHSABS 0.9 05/25/2022 1037   MONOABS 0.6 05/25/2022 1037   EOSABS 0.0 05/25/2022 1037   BASOSABS 0.0 05/25/2022 1037     CMP     Component Value Date/Time   NA 145 05/25/2022 1117   K 4.0 05/25/2022 1117   CL 112 (H) 05/25/2022 1037   CO2 25 05/25/2022 1037   GLUCOSE 86 05/25/2022 1037   GLUCOSE 102 (H) 08/10/2006 0939   BUN 31 (H) 05/25/2022 1037   CREATININE 1.23 (H) 05/25/2022 1037   CALCIUM 10.0 05/25/2022 1037   CALCIUM 10.6 (H) 01/21/2008 0244   PROT 5.8 (L) 05/25/2022 1037   ALBUMIN 3.3 (L) 05/25/2022 1037   AST 40 05/25/2022 1037   ALT 67 (H) 05/25/2022 1037   ALKPHOS 80 05/25/2022 1037   BILITOT 1.0 05/25/2022 1037   GFRNONAA 42 (L) 05/25/2022 1037   GFRAA >60 05/01/2017 0455    Imaging: Chest x-ray: showing cardiomegaly with moderate diffuse edema and bilateral pleural effusions.  Right elbow x-ray: showing elbow joint effusion and nondisplaced fracture of radial head  CT head and neck: negative for any acute intracranial abnormalities or fractures  CT chest: showing bilateral dependent pleural effusions and pulmonary edema.  EKG: personally reviewed my interpretation is EKG showing normal sinus rhythm  with a rate of 84.  Normal axis.  No ischemic changes noted.  Unchanged from previous EKG  ASSESSMENT & PLAN:   Assessment & Plan by Problem: Principal Problem:   Acute encephalopathy Active Problems:   Essential hypertension   Dementia with behavioral disturbance (HCC)   AKI (acute kidney injury) ([place])   Aortic stenosis   Jamie [name] is a 88 y.o. person living with a history of HFrEF, anxiety, aortic stenosis, chronic pain, dementia, HTN, and HLD presenting to the emergency room for concerns for shortness of breath.  Patient admitted for acute hypoxic metabolic encephalopathy.  #Acute hypoxic metabolic encephalopathy Patient has a 3-day history of decreased activity per nursing home.  Patient lives at [facility].  At baseline patient normally walks around, and is alert.  Her nursing facility, patient was gasping for air this morning.  The facility reports that the patient has been declining over the past few days, and not walking around the facility as she normally does.  Patient has not had oral intake in the past 3 days.  Initial vital signs showing patient with SPO2 74% on room air at facility, with patient requiring 2 L oxygen satting at 95%.  Blood pressure 149/85, heart rate 82, respiratory rate 22.  Blood glucose initially was 109.  CT head negative for any structural abnormalities.  UA showing trace leukocytes. Infectious work-up still pending.  Initial lactate negative x2.  Patient is on multiple centrally acting medications, and this could be contributing.  Will obtain UDS.  Hypoxia, could be contributing as well.  Hypoxia could be secondary to heart failure exacerbation. -Neurochecks every 4 hours -TSH pending -UDS pending -Urine culture pending -Blood cultures pending -Continue oxygen to maintain SPO2 greater than 92% -Fall precautions -Hold centrally acting medications  #Acute on chronic heart failure with reduced ejection fraction exacerbation Patient does  have a history of chronic heart failure with reduced ejection fraction with most recent echo in 2016.  Chest x-ray showing cardiomegaly with moderate diffuse edema and bilateral pleural effusions.  CT chest showing Moderate dependent bilateral pleural effusions.  BNP 4250.6.  This could be acute exacerbation of heart failure. -Start Lasix 40 mg daily -Daily weights -Strict I's and  O's -Repeat echo pending  #Elevated creatinine This could be prerenal versus cardiorenal.  Baseline creatinine around 0.9-1.0.  Creatinine elevated to 1.23, slightly elevated from baseline. -We will give 40 mg of Lasix -Trend BMP  #Radial head fracture Right elbow x-ray showing nondisplaced fracture of radial head -Splint provided  #Anxiety/depression As patient is acutely encephalopathic, will hold home centrally acting medications.  Patient is on Xanax, Abilify, BuSpar, and Zoloft at home.   Diet: NPO VTE: Enoxaparin IVF: None Code: DNR  Prior to Admission Living Arrangement: Nursing facility  Anticipated Discharge Location: Nursing Facility  Barriers to Discharge: Clinical Improvement   Dispo: Admit patient to Inpatient with expected length of stay greater than 2 midnights.  Signed: Leigh Aurora, DO Internal Medicine Resident PGY-1 [phone] 05/25/2022, 2:49 PM

## 2022-05-26 DIAGNOSIS — G931 Anoxic brain damage, not elsewhere classified: Secondary | ICD-10-CM

## 2022-05-26 DIAGNOSIS — I509 Heart failure, unspecified: Secondary | ICD-10-CM

## 2022-05-26 LAB — BASIC METABOLIC PANEL
Anion gap: 7 (ref 5–15)
BUN: 26 mg/dL — ABNORMAL HIGH (ref 8–23)
CO2: 33 mmol/L — ABNORMAL HIGH (ref 22–32)
Calcium: 9.9 mg/dL (ref 8.9–10.3)
Chloride: 109 mmol/L (ref 98–111)
Creatinine, Ser: 1.25 mg/dL — ABNORMAL HIGH (ref 0.44–1.00)
GFR, Estimated: 41 mL/min — ABNORMAL LOW (ref >60)
Glucose, Bld: 70 mg/dL (ref 70–99)
Potassium: 3.9 mmol/L (ref 3.5–5.1)
Sodium: 149 mmol/L — ABNORMAL HIGH (ref 135–145)

## 2022-05-26 LAB — TSH: TSH: 0.49 u[IU]/mL (ref 0.350–4.500)

## 2022-05-26 LAB — CBC
HCT: 40.3 % (ref 36.0–46.0)
Hemoglobin: 12.8 g/dL (ref 12.0–15.0)
MCH: 28.8 pg (ref 26.0–34.0)
MCHC: 31.8 g/dL (ref 30.0–36.0)
MCV: 90.8 fL (ref 80.0–100.0)
Platelets: 279 10*3/uL (ref 150–400)
RBC: 4.44 MIL/uL (ref 3.87–5.11)
RDW: 15.6 % — ABNORMAL HIGH (ref 11.5–15.5)
WBC: 7.4 10*3/uL (ref 4.0–10.5)
nRBC: 0 % (ref 0.0–0.2)

## 2022-05-26 LAB — URINE CULTURE: Culture: <10000 — AB

## 2022-05-26 NOTE — Progress Notes (Signed)
Subjective:  Patient was sitting comfortably in chair this morning. She was non-verbal and mumbled incoherently a few times during encounter with the treatment team.   Objective:  Vitals over previous 24hr: Vitals:   05/25/22 2345 05/26/22 0000 05/26/22 0350 05/26/22 0800  BP: 120/70 121/61 132/74 125/67  Pulse: 70 84 80 79  Resp: '13 12 13 16  '$ Temp: 98.8 F (37.1 C)  98.6 F (37 C)   TempSrc: Axillary  Oral   SpO2: 93% 97% 97% 97%    General:      awake and alert, sitting comfortably in chair, not in acute distress Lungs:      normal respiratory effort, breathing unlabored, symmetrical chest rise, crackles in lower fields bilaterally Cardiac:      regular rate and rhythm, normal S1 and S2, loud systolic crescendo-decrescendo murmur, trace edema in lower extremities bilaterally Abdomen:      soft and non-distended, normoactive bowel sounds present in all four quadrants, no tenderness to palpation or guarding   Assessment/Plan: Jamie [name] is a 88 year old female with a past medical history of HFpEF, anxiety, aortic stenosis, chronic pain, dementia, hypertension, and hyperlipidemia who was brought to the ED by assisted living staff for shortness of breath and acute functional decline. She was admitted for acute hypoxic metabolic encephalopathy.   ---Acute hypoxic metabolic encephalopathy Assisted living staff at [facility] report three days of decreased function and breath shortness with low oxygen saturation levels. She has been ambulating and eating less than usual. Head CT was negative for any structural abnormalities. Chest CTA revealed cardiogenic pulmonary edema, dilated main pulmonary artery, and bilateral pleural effusions. Urinalysis demonstrated trace leukocytes with hemoglobinuria and proteins. Urine toxicology screen was negative. Respiratory panel and TSH both normal. Urine culture with insignificant growth. Etiology remains unclear, though most likely  hypoxia secondary to heart failure exacerbation and severe aortic stenosis. Given that patient takes multiple centrally-acting medications at home, polypharmacy is a possible contributing factor as well. OT believes patient is at her functional baseline while PT recommends SNF placement. > Monitor clinically for cognitive and physical improvement > Neurochecks q4 > Follow blood cultures > Deliver oxygen to maintain goal SpO2 >92% > Fall precautions > Hold aripiprazole and buspirone   ---Acute on chronic heart failure with preserved ejection fraction Patient has history of chronic heart failure with mildly reduced ejection fraction identified on echocardiogram performed 2016. Most recent echocardiogram 9-25 revealed normal left ventricular 60-65% ejection fraction, mitral annular calcification, and severe aortic stenosis calcification. Chest CT revealed bilateral pleural effusions and BNP 4250. On exam, crackles were heard in bilateral lung bases. These findings are consistent with an acute heart failure exacerbation. One dose of furosemide was administered 9-24 with subsequent laboratory testing demonstrating elevated bicarbonate and sodium. Treatment team will withhold administration of additional furosemide given these findings. > Daily weights > Strict ins-outs   ---Elevated creatinine Since admission, creatinine has been above baseline of 0.9 to 1.0. Likely prerenal given persistent elevation despite diuretic administration. > Trend BMP   ---Radial head fracture Right arm radiograph revealed nondisplaced fracture of radial head, likely secondary to recent fall. > Splint   ---Anxiety ---Depression Patient takes alprazolam, aripiprazole, buspirone, and sertraline at home. The IM treatment team has been holding these medications due to concern for worsening acute encephalopathy. > Resume sertraline to avoid abrupt shifts in concentration > Hold aripiprazole and  buspirone     Principal Problem:   Acute encephalopathy Active Problems:   Essential hypertension  Dementia with behavioral disturbance ([place])   AKI (acute kidney injury) ([place])   Aortic stenosis   Prior to Admission Living Arrangement: Anticipated Discharge Location: Barriers to Discharge: Dispo: Anticipated discharge in approximately 2 day(s).   Jamie Nickel, MD Internal Medicine PGY-1 Pager [phone]  After 5pm on weekdays and 1pm on weekends: On Call pager [phone]

## 2022-05-26 NOTE — Progress Notes (Signed)
Orthopedic Tech Progress Note Patient Details:  Jamie [name] 06/13/1930 [id number]  Patient ID: [name], female   DOB: [birth year], 88 y.o.   MRN: [id number] I spoke with the patients RN. The patient has their arm sling. Karolee Stamps 05/26/2022, 6:44 AM

## 2022-05-26 NOTE — Evaluation (Signed)
Occupational Therapy Evaluation Patient Details Name: Jamie [name] MRN: [id number] DOB: Mar 14, 1930 Today's Date: 05/26/2022   History of Present Illness 88 yo female [facility] from SNF with ShOB, physically declining.  chest x-ray showed bilateral pleural effusions.  Initial right elbow x-ray showing nondisplaced radial head fracture.  CT chest showed bilateral dependent pleural effusions.  CT head and neck negative for any intracranial abnormalities or any acute cervical spine fractures. PMH includes HFrEF with most recent echocardiogram on 10/21 with ejection fraction of 45 to 50%, anxiety, aortic stenosis, chronic pain, dementia, HTN, and HLD.   Clinical Impression   Pt currently total assist for all selfcare tasks with max assist for stand step transfers during this session without an assistive device.  Pt's facility reports that she could walk with her RW around the facility with supervision, but needed total assist for all selfcare and feeding tasks.  Feel she is likely at her baseline functionally except for mobility.  Will defer this to PT for treatment as pt cannot currently use a walker. No further acute care OT needs at this time.       Recommendations for follow up therapy are one component of a multi-disciplinary discharge planning process, led by the attending physician.  Recommendations may be updated based on patient status, additional functional criteria and insurance authorization.   Follow Up Recommendations  No OT follow up    Assistance Recommended at Discharge Frequent or constant Supervision/Assistance  Patient can return home with the following A lot of help with bathing/dressing/bathroom;A lot of help with walking and/or transfers;Assistance with feeding;Assist for transportation;Direct supervision/assist for medications management;Assistance with cooking/housework;Direct supervision/assist for financial management    Functional Status Assessment  Patient  has not had a recent decline in their functional status  Equipment Recommendations  None recommended by OT       Precautions / Restrictions Precautions Precautions: Fall Precaution Comments: RUE radial head fx - sling removed because it was soaked in urine and RUE propped with pillow for comfort Required Braces or Orthoses: Sling Restrictions Weight Bearing Restrictions: No      Mobility Bed Mobility Overal bed mobility: Needs Assistance Bed Mobility: Sit to Supine       Sit to supine: Total assist   General bed mobility comments: Total assist for all aspects secondary to not understanding therapist's directions to lay down after transfer to the EOB.    Transfers Overall transfer level: Needs assistance Equipment used: 1 person hand held assist Transfers: Sit to/from Stand, Bed to chair/wheelchair/BSC Sit to Stand: Mod assist     Step pivot transfers: Max assist            Balance Overall balance assessment: Needs assistance Sitting-balance support: No upper extremity supported, Feet supported Sitting balance-Leahy Scale: Poor Sitting balance - Comments: LOB posteriorly when sitting in the chair and therapist completed AAROM with the LUE.   Standing balance support: Single extremity supported, During functional activity Standing balance-Leahy Scale: Poor Standing balance comment: Pt needs assist from therapist to maintain balance with standing or transfer.                           ADL either performed or assessed with clinical judgement   ADL Overall ADL's : At baseline  General ADL Comments: Per facility, pt needs total assist for all selfcare and feeding secondary to cognitive deficits.  She was walking around with her RW and someone with her per their report and this was the only functional task she was able to do.     Vision Baseline Vision/History: 0 No visual deficits Ability to See in  Adequate Light: 0 Adequate Patient Visual Report: Other (comment) (unable to determine) Vision Assessment?:  (unable to determine)     Perception Perception Perception: Not tested   Praxis Praxis Praxis: Not tested    Pertinent Vitals/Pain Pain Assessment Pain Assessment: Faces Faces Pain Scale: Hurts a little bit Pain Location: generalized Pain Descriptors / Indicators: Other (Comment) (pt making noised but not moans) Pain Intervention(s): Monitored during session     Hand Dominance  (unable to determine)   Extremity/Trunk Assessment Upper Extremity Assessment Upper Extremity Assessment: RUE deficits/detail RUE Deficits / Details: RUE in sling secondary to proximal tradial head fracture.  Pt unable to follow instructions for AROM and strength testing, but would move LUE spontaneously.  She would hold the RUE out in front of her in the sling and elevated at the shoulder approximately 60 degrees.  Therapist continually having to continually reposition it back down by her side.   Lower Extremity Assessment Lower Extremity Assessment: Defer to PT evaluation   Cervical / Trunk Assessment Cervical / Trunk Assessment: Kyphotic   Communication Communication Communication: Other (comment)   Cognition Arousal/Alertness: Lethargic Behavior During Therapy: Flat affect Overall Cognitive Status: No family/caregiver present to determine baseline cognitive functioning                                 General Comments: Per facility, pt needed assist with all aspects of selfcare seconadary to cognition.  She is likely at her baseline cognitively, however non-verbal.     General Comments  bruising to RUE, pt with green/yellow discharge from both eyes which PT helped clean off with warm washcloth. Pt heavily soiled in urine upon PT arrival to room, requires total care for cleanup. on 3LO2, sats [facility]            Home Living Family/patient expects to be discharged to:: Skilled  nursing facility                                 Additional Comments: [facility]      Prior Functioning/Environment Prior Level of Function : Patient poor historian/Family not available;Needs assist             Mobility Comments: Talked with caregivers at [facility] and she walked around with her RW with someone with her ADLs Comments: Pt needed total assist for ADLs per facility.         AM-PAC OT "6 Clicks" Daily Activity     Outcome Measure Help from another person eating meals?: Total Help from another person taking care of personal grooming?: Total Help from another person toileting, which includes using toliet, bedpan, or urinal?: Total Help from another person bathing (including washing, rinsing, drying)?: Total Help from another person to put on and taking off regular upper body clothing?: Total Help from another person to put on and taking off regular lower body clothing?: Total 6 Click Score: 6   End of Session Equipment Utilized During Treatment: Other (comment);Oxygen (sling) Nurse Communication: Mobility status;Other (comment) (need  for sling order)  Activity Tolerance: Patient limited by lethargy Patient left: in bed;with call bell/phone within reach;with bed alarm set                   Time: 0388-8280 OT Time Calculation (min): 26 min Charges:  OT General Charges $OT Visit: 1 Visit OT Evaluation $OT Eval Low Complexity: 1 Low OT Treatments $Self Care/Home Management : 8-22 mins Lexi Conaty OTR/L   05/26/2022, 1:28 PM

## 2022-05-26 NOTE — Progress Notes (Signed)
Orthopedic Tech Progress Note Patient Details:  Jamie [name] 1930/07/22 [id number]  OT came by this morning and took a sling up to patient, due to it getting soiled  Ortho Devices Type of Ortho Device: Arm sling Ortho Device/Splint Location: RUE Ortho Device/Splint Interventions: Ordered, Application, Adjustment   Post Interventions Patient Tolerated: Well Instructions Provided: Care of device  Janit Pagan 05/26/2022, 12:55 PM

## 2022-05-26 NOTE — Evaluation (Signed)
Physical Therapy Evaluation Patient Details Name: Jamie [name] MRN: [id number] DOB: [birth year] Today's Date: 05/26/2022  History of Present Illness  88 yo female presents to [facility] on 9/24 from SNF with ShOB, physically declining.  chest x-ray showed bilateral pleural effusions.  Initial right elbow x-ray showing nondisplaced radial head fracture.  CT chest showed bilateral dependent pleural effusions.  CT head and neck negative for any intracranial abnormalities or any acute cervical spine fractures. PMH includes HFrEF with most recent echocardiogram on 10/21 with ejection fraction of 45 to 50%, anxiety, aortic stenosis, chronic pain, dementia, HTN, and HLD.  Clinical Impression   Pt presents with weakness, impaired balance with presumed recent fall given RUE fx, grimacing/guarding during mobility, and impaired activity tolerance. Pt to benefit from acute PT to address deficits. Pt tolerated stand pivot to recliner with mod-max physical support to perform, PT feels pt would have done better physically had she understood verbal commands better but responded well to tactile cuing. PT recommending SNF level of care post-acutely. PT to progress mobility as tolerated, and will continue to follow acutely.         Recommendations for follow up therapy are one component of a multi-disciplinary discharge planning process, led by the attending physician.  Recommendations may be updated based on patient status, additional functional criteria and insurance authorization.  Follow Up Recommendations Skilled nursing-short term rehab (<3 hours/day) Can patient physically be transported by private vehicle: No    Assistance Recommended at Discharge Frequent or constant Supervision/Assistance  Patient can return home with the following  A lot of help with walking and/or transfers;A lot of help with bathing/dressing/bathroom;Assistance with cooking/housework;Direct supervision/assist for financial management;Assist for  transportation;Help with stairs or ramp for entrance    Equipment Recommendations None recommended by PT  Recommendations for Other Services       Functional Status Assessment Patient has had a recent decline in their functional status and demonstrates the ability to make significant improvements in function in a reasonable and predictable amount of time.     Precautions / Restrictions Precautions Precautions: Fall Precaution Comments: RUE radial head fx - sling removed because it was soaked in urine and RUE propped with pillow for comfort Required Braces or Orthoses: Sling Restrictions Weight Bearing Restrictions: No      Mobility  Bed Mobility Overal bed mobility: Needs Assistance Bed Mobility: Supine to Sit     Supine to sit: Max assist     General bed mobility comments: assist for trunk and LE management    Transfers Overall transfer level: Needs assistance Equipment used: 1 person hand held assist Transfers: Sit to/from Stand, Bed to chair/wheelchair/BSC Sit to Stand: Mod assist, +2 safety/equipment   Step pivot transfers: Mod assist       General transfer comment: assist for rise, steady, and pivotal steps to recliner.    Ambulation/Gait                  Stairs            Wheelchair Mobility    Modified Rankin (Stroke Patients Only)       Balance Overall balance assessment: Needs assistance Sitting-balance support: No upper extremity supported, Feet supported Sitting balance-Leahy Scale: Fair     Standing balance support: Single extremity supported, During functional activity Standing balance-Leahy Scale: Poor Standing balance comment: reliant on PT steadying assist  Pertinent Vitals/Pain Pain Assessment Pain Assessment: Faces Faces Pain Scale: Hurts little more Pain Location: generalized during mobility Pain Descriptors / Indicators: Guarding, Grimacing Pain Intervention(s): Limited  activity within patient's tolerance, Monitored during session, Repositioned    Home Living Family/patient expects to be discharged to:: Skilled nursing facility                        Prior Function Prior Level of Function : Needs assist;Patient poor historian/Family not available             Mobility Comments: per chart review, pt ambulatory in facility up until 3 days ago ADLs Comments: Unsure, pt with limited verbalization at baseline     Hand Dominance        Extremity/Trunk Assessment   Upper Extremity Assessment Upper Extremity Assessment: Generalized weakness    Lower Extremity Assessment Lower Extremity Assessment: Generalized weakness    Cervical / Trunk Assessment Cervical / Trunk Assessment: Kyphotic  Communication   Communication: Other (comment) (documented "nonverbal")  Cognition Arousal/Alertness: Lethargic Behavior During Therapy: Restless Overall Cognitive Status: No family/caregiver present to determine baseline cognitive functioning                                 General Comments: Pt with very little command following, responds best to tactile cuing        General Comments General comments (skin integrity, edema, etc.): bruising to RUE, pt with green/yellow discharge from both eyes which PT helped clean off with warm washcloth. Pt heavily soiled in urine upon PT arrival to room, requires total care for cleanup. on 3LO2, sats [facility]    Exercises     Assessment/Plan    PT Assessment Patient needs continued PT services  PT Problem List Decreased strength;Decreased mobility;Decreased balance;Decreased knowledge of use of DME;Pain;Decreased activity tolerance;Decreased cognition;Cardiopulmonary status limiting activity;Decreased skin integrity       PT Treatment Interventions DME instruction;Therapeutic activities;Gait training;Therapeutic exercise;Patient/family education;Balance training;Functional mobility  training;Neuromuscular re-education    PT Goals (Current goals can be found in the Care Plan section)  Acute Rehab PT Goals PT Goal Formulation: With patient Time For Goal Achievement: 06/09/22 Potential to Achieve Goals: Good    Frequency Min 2X/week     Co-evaluation               AM-PAC PT "6 Clicks" Mobility  Outcome Measure Help needed turning from your back to your side while in a flat bed without using bedrails?: A Lot Help needed moving from lying on your back to sitting on the side of a flat bed without using bedrails?: A Lot Help needed moving to and from a bed to a chair (including a wheelchair)?: A Lot Help needed standing up from a chair using your arms (e.g., wheelchair or bedside chair)?: A Lot Help needed to walk in hospital room?: Total Help needed climbing 3-5 steps with a railing? : Total 6 Click Score: 10    End of Session Equipment Utilized During Treatment: Oxygen Activity Tolerance: Patient tolerated treatment well;Patient limited by fatigue Patient left: in chair;with chair alarm set;with call bell/phone within reach;Other (comment) Nurse Communication: Mobility status PT Visit Diagnosis: Other abnormalities of gait and mobility (R26.89);Muscle weakness (generalized) (M62.81)    Time: 3762-8315 PT Time Calculation (min) (ACUTE ONLY): 22 min   Charges:   PT Evaluation $PT Eval Low Complexity: 1 Low  Stacie Glaze, PT DPT Acute Rehabilitation Services Pager [phone]  Office [phone]   Louis Matte 05/26/2022, 12:01 PM

## 2022-05-26 NOTE — Care Management Obs Status (Addendum)
[facility] NOTIFICATION   Patient Details  Name: Jamie [name] MRN: [id number] Date of Birth: Feb 01, 1930   Medicare Observation Status Notification Given:  Yes  Noted that patient should have Medicare Part A at least and possibly UHC advantage plan.   [name], LCSW 05/26/2022, 3:31 PM

## 2022-05-26 NOTE — TOC Initial Note (Signed)
Transition of Care [facility]) - Initial/Assessment Note    Patient Details  Name: Jamie [name] MRN: [id number] Date of Birth: Jan 12, 1930  [facility]) CM/SW Contact:    Jamie Halsted, LCSW Phone Number: 05/26/2022, 11:13 AM  Clinical Narrative:                 CSW received consult to locate patient's guardian. CSW spoke with patient's legal guardian, Jamie [name]. She reported patient is under private pay at [employer] (has a state guardian that pays for it) though she believes she has Medicare as well. She stated patient was not on oxygen at the facility. CSW passed on contact info to MD and will continue to follow.   Expected Discharge Plan: Assisted Living Barriers to Discharge: Continued Medical Work up   Patient Goals and CMS Choice Patient states their goals for this hospitalization and ongoing recovery are:: Feel better CMS Medicare.gov Compare Post Acute Care list provided to:: Legal Guardian Choice offered to / list presented to : [place] / Guardian  Expected Discharge Plan and Services Expected Discharge Plan: Assisted Living In-house Referral: Clinical Social Work   Post Acute Care Choice: Resumption of Svcs/PTA Provider Living arrangements for the past 2 months: [place]                                      Prior Living Arrangements/Services Living arrangements for the past 2 months: [place] Lives with:: Facility Resident Patient language and need for interpreter reviewed:: Yes Do you feel safe going back to the place where you live?: Yes      Need for Family Participation in Patient Care: Yes (Comment) Care giver support system in place?: Yes (comment)   Criminal Activity/Legal Involvement Pertinent to Current Situation/Hospitalization: No - Comment as needed  Activities of Daily Living      Permission Sought/Granted Permission sought to share information with : Facility Sport and exercise psychologist, Family  Supports Permission granted to share information with : No  Share Information with NAME: Jamie [name]  Permission granted to share info w AGENCY: [place] granted to share info w Relationship: Legal Guardian  Permission granted to share info w Contact Information: [phone]  Emotional Assessment Appearance:: Appears stated age   Affect (typically observed): Appropriate Orientation: : Oriented to Self, Oriented to Place, Oriented to  Time, Oriented to Situation Alcohol / Substance Use: Not Applicable Psych Involvement: No (comment)  Admission diagnosis:  Acute respiratory failure with hypoxia ([place]) [J96.01] Acute encephalopathy [G93.40] Total self-care deficit [R68.89] Acute on chronic congestive heart failure, unspecified heart failure type [facility]) [I50.9] Patient Active Problem List   Diagnosis Date Noted   Acute encephalopathy 05/25/2022   Obstipation 04/30/2017   Aortic stenosis 06/22/2015   Hypotension 06/21/2015   Chest pain 06/21/2015   Anemia associated with acute blood loss    AKI (acute kidney injury) ([place])    Tachypnea    Hypoxia 04/18/2015   Acute respiratory failure with hypoxia and hypercapnia ([place]) 04/17/2015   Acute respiratory failure with hypoxia ([place]) 04/17/2015   HCAP (healthcare-associated pneumonia) 04/17/2015   Acute-on-chronic kidney injury ([place]) 04/17/2015   Anemia 04/17/2015   Sepsis ([place]) 04/17/2015   Cellulitis of leg, right 01/01/2015   Acute blood loss anemia 01/01/2015   Elevated troponin 01/01/2015   Hypokalemia 01/01/2015   Depression 01/01/2015   GI bleed 12/27/2014   Bleeding gastrointestinal  Hemorrhagic shock ([place])    Alcohol dependence with uncomplicated withdrawal ([place])    Alcohol dependence ([place]) 09/30/2014   Dementia with behavioral disturbance ([place]) 09/29/2014   Delusions ([place]) 09/29/2014   Dementia ([place]) 12/13/2013   GI bleeding 08/12/2012   Obesity    Chronic pain    Gastric ulcer 05/03/2011   Bladder  spasm 02/10/2011   Murmur, cardiac 11/20/2010   SHOULDER PAIN, RIGHT 04/15/2010   LOC OSTEOARTHROS NOT SPEC PRIM/SEC LOWER LEG 04/10/2010   CALF PAIN, RIGHT 03/01/2010   CERVICAL STRAIN, ACUTE 02/25/2010   ANXIETY STATE, UNSPECIFIED 06/20/2009   SEBACEOUS CYST, INFECTED 06/20/2009   ALLERGIC RHINITIS DUE TO OTHER ALLERGEN 11/17/2008   TACHYCARDIA 05/16/2008   CONTUSION OF BREAST 05/05/2008   HYPERCALCEMIA 01/20/2008   EPISTAXIS 12/30/2007   VITAMIN B12 DEFICIENCY 11/23/2007   APHTHOUS ULCERS 11/23/2007   ASYMPTOMATIC POSTMENOPAUSAL STATUS 08/18/2007   Essential hypertension 05/10/2007   ADVEF, DRUG/MEDICINAL/BIOLOGICAL SUBST NOS 05/10/2007   Hyperlipemia 02/23/2007   PCP:  Jamie Mans, NP Pharmacy:   [facility] [address] [place] [address] [place] [address] Phone: [phone] Fax: [phone]     Social Determinants of Health (SDOH) Interventions    Readmission Risk Interventions     No data to display

## 2022-05-27 DIAGNOSIS — I5033 Acute on chronic diastolic (congestive) heart failure: Secondary | ICD-10-CM | POA: Diagnosis present

## 2022-05-27 DIAGNOSIS — H01003 Unspecified blepharitis right eye, unspecified eyelid: Secondary | ICD-10-CM | POA: Diagnosis present

## 2022-05-27 DIAGNOSIS — R627 Adult failure to thrive: Secondary | ICD-10-CM | POA: Diagnosis present

## 2022-05-27 DIAGNOSIS — Z515 Encounter for palliative care: Secondary | ICD-10-CM | POA: Diagnosis not present

## 2022-05-27 DIAGNOSIS — I350 Nonrheumatic aortic (valve) stenosis: Secondary | ICD-10-CM | POA: Diagnosis present

## 2022-05-27 DIAGNOSIS — N179 Acute kidney failure, unspecified: Secondary | ICD-10-CM | POA: Diagnosis present

## 2022-05-27 DIAGNOSIS — Z83438 Family history of other disorder of lipoprotein metabolism and other lipidemia: Secondary | ICD-10-CM | POA: Diagnosis not present

## 2022-05-27 DIAGNOSIS — E870 Hyperosmolality and hypernatremia: Secondary | ICD-10-CM | POA: Diagnosis present

## 2022-05-27 DIAGNOSIS — J9601 Acute respiratory failure with hypoxia: Secondary | ICD-10-CM | POA: Diagnosis present

## 2022-05-27 DIAGNOSIS — Z20822 Contact with and (suspected) exposure to covid-19: Secondary | ICD-10-CM | POA: Diagnosis present

## 2022-05-27 DIAGNOSIS — J309 Allergic rhinitis, unspecified: Secondary | ICD-10-CM | POA: Diagnosis present

## 2022-05-27 DIAGNOSIS — I5020 Unspecified systolic (congestive) heart failure: Secondary | ICD-10-CM

## 2022-05-27 DIAGNOSIS — E873 Alkalosis: Secondary | ICD-10-CM | POA: Diagnosis present

## 2022-05-27 DIAGNOSIS — Z8249 Family history of ischemic heart disease and other diseases of the circulatory system: Secondary | ICD-10-CM | POA: Diagnosis not present

## 2022-05-27 DIAGNOSIS — G9340 Encephalopathy, unspecified: Secondary | ICD-10-CM

## 2022-05-27 DIAGNOSIS — E669 Obesity, unspecified: Secondary | ICD-10-CM | POA: Diagnosis present

## 2022-05-27 DIAGNOSIS — R296 Repeated falls: Secondary | ICD-10-CM | POA: Diagnosis present

## 2022-05-27 DIAGNOSIS — I10 Essential (primary) hypertension: Secondary | ICD-10-CM

## 2022-05-27 DIAGNOSIS — E785 Hyperlipidemia, unspecified: Secondary | ICD-10-CM | POA: Diagnosis present

## 2022-05-27 DIAGNOSIS — F03918 Unspecified dementia, unspecified severity, with other behavioral disturbance: Secondary | ICD-10-CM | POA: Diagnosis present

## 2022-05-27 DIAGNOSIS — I110 Hypertensive heart disease with heart failure: Secondary | ICD-10-CM | POA: Diagnosis present

## 2022-05-27 DIAGNOSIS — F32A Depression, unspecified: Secondary | ICD-10-CM | POA: Diagnosis present

## 2022-05-27 DIAGNOSIS — G8929 Other chronic pain: Secondary | ICD-10-CM | POA: Diagnosis present

## 2022-05-27 DIAGNOSIS — G9341 Metabolic encephalopathy: Secondary | ICD-10-CM | POA: Diagnosis present

## 2022-05-27 DIAGNOSIS — Z66 Do not resuscitate: Secondary | ICD-10-CM | POA: Diagnosis present

## 2022-05-27 DIAGNOSIS — H01006 Unspecified blepharitis left eye, unspecified eyelid: Secondary | ICD-10-CM | POA: Diagnosis present

## 2022-05-27 DIAGNOSIS — S52124A Nondisplaced fracture of head of right radius, initial encounter for closed fracture: Secondary | ICD-10-CM | POA: Diagnosis present

## 2022-05-27 LAB — BASIC METABOLIC PANEL
Anion gap: 10 (ref 5–15)
BUN: 24 mg/dL — ABNORMAL HIGH (ref 8–23)
CO2: 33 mmol/L — ABNORMAL HIGH (ref 22–32)
Calcium: 10.4 mg/dL — ABNORMAL HIGH (ref 8.9–10.3)
Chloride: 107 mmol/L (ref 98–111)
Creatinine, Ser: 1.11 mg/dL — ABNORMAL HIGH (ref 0.44–1.00)
GFR, Estimated: 47 mL/min — ABNORMAL LOW (ref >60)
Glucose, Bld: 88 mg/dL (ref 70–99)
Potassium: 3.8 mmol/L (ref 3.5–5.1)
Sodium: 150 mmol/L — ABNORMAL HIGH (ref 135–145)

## 2022-05-27 MED ORDER — DEXTROSE 5 % IV SOLN: INTRAVENOUS | Status: AC

## 2022-05-27 MED ORDER — SERTRALINE HCL 50 MG PO TABS
50.0000 mg | ORAL_TABLET | Freq: Every day | ORAL | Status: DC
  Administered 2022-05-27 – 2022-05-31 (×5): 50 mg via ORAL
  Filled 2022-05-27 (×5): qty 1

## 2022-05-27 NOTE — Progress Notes (Signed)
Subjective:  Patient was lying comfortably in bed this morning. She was initially sleeping but awoke to painful tactile stimulus and become more alert when hearing [place] interpretor over the phone. Treatment team communicated reason for hospitalization and plans to consult palliative care.   Objective:  Vitals over previous 24hr: Vitals:   05/27/22 0750 05/27/22 0800 05/27/22 0840 05/27/22 1200  BP: (!) 142/74 (!) 143/78  (!) 144/84  Pulse: 97 97 97 90  Resp: (!) '25  17 17  '$ Temp: (!) 97.5 F (36.4 C)   97.7 F (36.5 C)  TempSrc: Axillary   Axillary  SpO2: (!) 88% 96% 97% 98%    General:      awake and alert, lying comfortably in bed, not in acute distress Lungs:      normal respiratory effort, breathing unlabored, symmetrical chest rise Cardiac:      regular rate and rhythm, normal S1 and S2, loud systolic crescendo-decrescendo murmur, trace edema in lower extremities bilaterally Abdomen:      soft and non-distended, no tenderness to palpation or guarding   Assessment/Plan: Jamie [name] is a 88 year old female with a past medical history of HFpEF, anxiety, aortic stenosis, chronic pain, dementia, hypertension, and hyperlipidemia who was brought to the ED by assisted living staff for shortness of breath and acute functional decline. She was admitted for acute hypoxic metabolic encephalopathy.   ---Acute hypoxic metabolic encephalopathy Assisted living staff at [facility] report three days of decreased function and breath shortness with low oxygen saturation levels. She has been ambulating and eating less than usual. Head CT was negative for any structural abnormalities. Chest CTA revealed cardiogenic pulmonary edema, dilated main pulmonary artery, and bilateral pleural effusions. Urinalysis demonstrated trace leukocytes with hemoglobinuria and proteins. Urine toxicology screen and respiratory panel were negative. Urine culture with insignificant growth. Etiology  remains unclear, though most likely hypoxia secondary to acute heart failure exacerbation and severe aortic stenosis. Given that patient takes multiple centrally-acting medications at home, polypharmacy is a possible contributing factor as well. The OT team believes patient is at her functional baseline while PT recommends SNF placement. Hypernatremia noted on laboratory testing 9-25 and 9-26. Given severity of chronic conditions that will affect life quality, patient will likely benefit from goals meeting with palliative care. > Consult palliative care, appreciate recommendations > Monitor clinically for cognitive and physical improvement > Neurochecks q4 > Bolus of 519m D5 over 5hr for hypernatremia > Follow blood cultures, no growth at 2d > Deliver oxygen to maintain goal SpO2 >92% > Fall precautions > Hold aripiprazole and buspirone   ---Acute on chronic heart failure with preserved ejection fraction Patient has history of chronic heart failure with mildly reduced ejection fraction identified on echocardiogram performed 2016. Most recent echocardiogram 9-25 revealed normal left ventricular 60-65% ejection fraction, mitral annular calcification, and severe aortic stenosis calcification. Chest CT revealed bilateral pleural effusions and BNP 4250. On exam, crackles were heard in bilateral lung bases. These findings are consistent with an acute heart failure exacerbation. One dose of furosemide was administered 9-24 with subsequent laboratory testing demonstrating elevated bicarbonate and sodium. Treatment team will withhold administration of additional furosemide given these findings. > Daily weights > Strict ins-outs   ---Elevated creatinine Since admission, creatinine has been above baseline of 0.9 to 1.0. Likely prerenal given persistent elevation despite diuretic administration. > Trend BMP   ---Radial head fracture Right arm radiograph revealed nondisplaced fracture of radial head,  likely secondary to recent fall. > Splint   ---  Anxiety ---Depression Patient takes alprazolam, aripiprazole, buspirone, and sertraline at home. The IM treatment team has been holding these medications due to concern for worsening acute encephalopathy. > Resume sertraline to avoid abrupt shifts in concentration > Hold aripiprazole and buspirone     Principal Problem:   Acute encephalopathy Active Problems:   Essential hypertension   Dementia with behavioral disturbance (HCC)   AKI (acute kidney injury) ([place])   Aortic stenosis   Acute on chronic congestive heart failure ([place])   Prior to Admission Living Arrangement: Anticipated Discharge Location: Barriers to Discharge: Dispo: Anticipated discharge in approximately 2 day(s).   Roswell Nickel, MD Internal Medicine PGY-1 Pager [phone]  After 5pm on weekdays and 1pm on weekends: On Call pager [phone]

## 2022-05-27 NOTE — Plan of Care (Signed)
Pt didn't eat any of her dinner tonight. Pt slept most of the shift. Pt wouldn't leave oxygen on. [place] changed and was taped to side of her face and oxygen decreased to 1 L/M. Pt kept taking tele patches and gown off. Pt wouldn't let me give her her HS dose of Lovenox. She kept pushing my hands away and was pushing on my hands when I was cleaning her up. She just kept saying No.

## 2022-05-27 NOTE — Progress Notes (Signed)
  Date: 05/27/2022  Patient name: Jamie [name]  Medical record number: [id number]  Date of birth: 25-Sep-1929        I have seen and evaluated this patient and I have discussed the plan of care with the house staff. Please see their note for complete details. I concur with their findings with the following additions/corrections: As noted she was sleeping when we entered.  Was able to wake by sternal rub.  When we get the [place] interpreter on the phone she did seem to interact but would not speak she did follow simple directions of raising her hand.  We did explain to her why she is here that she has had acute heart failure exacerbation and that we are somewhat limited by her severe aortic stenosis.  Dr. Jodi Mourning is trying to reach out to her legal guardian we will also consult palliative care I anticipate that she will continue to struggle with volume status given her severe aortic stenosis and we do not have other options for treatment.  We will try to improve her hypernatremia with a small bolus of D5W and promote oral intake.  Lucious Groves, DO 05/27/2022, 4:32 PM

## 2022-05-28 ENCOUNTER — Encounter (HOSPITAL_COMMUNITY): Payer: Self-pay | Admitting: Internal Medicine

## 2022-05-28 DIAGNOSIS — F03918 Unspecified dementia, unspecified severity, with other behavioral disturbance: Secondary | ICD-10-CM

## 2022-05-28 DIAGNOSIS — I5023 Acute on chronic systolic (congestive) heart failure: Secondary | ICD-10-CM

## 2022-05-28 DIAGNOSIS — N179 Acute kidney failure, unspecified: Secondary | ICD-10-CM

## 2022-05-28 DIAGNOSIS — I110 Hypertensive heart disease with heart failure: Secondary | ICD-10-CM

## 2022-05-28 LAB — BASIC METABOLIC PANEL
Anion gap: 10 (ref 5–15)
BUN: 24 mg/dL — ABNORMAL HIGH (ref 8–23)
CO2: 33 mmol/L — ABNORMAL HIGH (ref 22–32)
Calcium: 10.3 mg/dL (ref 8.9–10.3)
Chloride: 106 mmol/L (ref 98–111)
Creatinine, Ser: 1.02 mg/dL — ABNORMAL HIGH (ref 0.44–1.00)
GFR, Estimated: 52 mL/min — ABNORMAL LOW (ref >60)
Glucose, Bld: 95 mg/dL (ref 70–99)
Potassium: 4 mmol/L (ref 3.5–5.1)
Sodium: 149 mmol/L — ABNORMAL HIGH (ref 135–145)

## 2022-05-28 MED ORDER — DEXTROSE 5 % IV SOLN: INTRAVENOUS | Status: AC

## 2022-05-28 NOTE — Hospital Course (Addendum)
9-27: Opens eyes to verbal stimulus. Does not follow commands.  9-28: Eyes open spontaneously. Spoke to patient through [place] interpreter. Non-verbal. Does not follow commands consistently.    9/30 Vocalizing this morning. May have asked for water. Spoke to the patient with the assistance of a [place] language interpreter. Does not follow commands this morning.  Summary  Jamie [name] is a 88 year old female with past medical history of dementia, heart failure with reduced ejection fraction, severe aortic stenosis, hypertension, who presented to emergency room for acute encephalopathy with shortness of breath.  She was found to have acute hypoxic encephalopathy secondary to heart failure exacerbation in the setting of severe aortic stenosis.  Echocardiogram confirmed severe aortic stenosis.  Dialysis patient is not a candidate for TAVR or surgery per cardiology.  We were unable to diurese given her poor p.o. intake and hypernatremia.  We were unable to engage in meaningful conversation given with history of translator.  Overall this is end-stage dementia and end-stage heart disease without reversible treatment.  We believe that aggressive medical therapy would not have any meaningful recovery.  The next best step would be palliative care and possible transition to hospice/comfort care.

## 2022-05-28 NOTE — Progress Notes (Signed)
Subjective:  Patient was drowsy this morning, but intermittently opened eyes to verbal stimulus. Communication attempted with [place] translator, patient was unable to follow commands. Explained reason for hospitalization and plans for palliative care services via interpretor.   Objective:  Vitals over previous 24hr: Vitals:   05/27/22 2300 05/28/22 0000 05/28/22 0336 05/28/22 0500  BP: (!) 130/59 132/71 135/72   Pulse: 64 71 79   Resp: '16 15 16   '$ Temp: (!) 97.5 F (36.4 C)  97.9 F (36.6 C)   TempSrc: Oral  Oral   SpO2: 99% 98% 97%   Weight:    52.5 kg    General:      drowsy but arousable to loud verbal stimuli, lying comfortably in bed, not in acute distress, unable to follow commands Lungs:      normal respiratory effort, breathing unlabored, symmetrical chest rise Cardiac:      regular rate and rhythm, normal S1 and S2, loud systolic crescendo-decrescendo murmur Abdomen:      soft and non-distended, no tenderness to palpation or guarding   Assessment/Plan: Jamie [name] is a 88 year old female with a past medical history of HFpEF, anxiety, aortic stenosis, chronic pain, dementia, hypertension, and hyperlipidemia who was brought to the ED by assisted living staff for shortness of breath and acute functional decline. She was admitted for acute hypoxic metabolic encephalopathy.   ---Acute hypoxic metabolic encephalopathy Assisted living staff at [facility] report three days of decreased function and breath shortness with low oxygen saturation levels. Head CT negative. Chest CTA revealed cardiogenic pulmonary edema, dilated main pulmonary artery, and bilateral pleural effusions. Infectious and toxicology workup was negative. Etiology most likely hypoxia secondary to acute heart failure exacerbation and severe aortic stenosis. Given that patient takes multiple centrally-acting medications at home, polypharmacy is a possible contributing factor as well. The OT team  believes patient is at her functional baseline while PT recommends SNF placement. Given severity of chronic conditions that will affect life quality, patient will likely benefit from meeting with palliative care and a consult has been placed. Multiple attempts to contact legal guardian Jamie [name] have been unsuccessful, IM team will continue trying. > Consult palliative care, appreciate recommendations > Contact legal guardian Jamie [name] about treatment plans > Monitor clinically for cognitive and physical improvement > Neurochecks q4 > Follow blood cultures, no growth at 3d > Deliver oxygen to maintain goal SpO2 >92% > Fall precautions > Hold aripiprazole and buspirone   ---Acute on chronic heart failure with preserved ejection fraction Patient has history of chronic heart failure with mildly reduced ejection fraction identified on echocardiogram performed 2016. Most recent echocardiogram 9-25 revealed normal left ventricular 60-65% ejection fraction, mitral annular calcification, and severe aortic stenosis calcification. Chest CT revealed bilateral pleural effusions and BNP 4250. On exam, crackles were heard in bilateral lung bases. These findings are consistent with an acute heart failure exacerbation. One dose of furosemide was administered 9-24, but further diuresis withheld given subsequent elevations in bicarbonate and sodium. > Daily weights > Strict ins-outs   ---Hypernatremia On 9-25, laboratory testing revealed elevated sodium. Managed conservatively with intermittent D5 bolus, hesitant to use large volumes given heart failure exacerbation. > Trend CMP > Intravenous D5 bolus as needed for Na >148   ---Elevated creatinine Since admission, creatinine has been above baseline of 0.9 to 1.0. Likely prerenal given persistent elevation despite diuretic administration. Has been down-trending and is now 9-27 close to baseline. > Trend BMP   ---Radial head fracture  Right arm  radiograph revealed nondisplaced fracture of radial head, likely secondary to recent fall. > Splint   ---Anxiety ---Depression Patient takes alprazolam, aripiprazole, buspirone, and sertraline at home. The IM treatment team has been holding these medications due to concern for worsening acute encephalopathy. > Sertraline '50mg'$  q24 > Hold aripiprazole and buspirone     Principal Problem:   Acute encephalopathy Active Problems:   Essential hypertension   Dementia with behavioral disturbance (HCC)   AKI (acute kidney injury) ([place])   Aortic stenosis   Acute on chronic congestive heart failure ([place])   Prior to Admission Living Arrangement: Anticipated Discharge Location: Barriers to Discharge: Dispo: Anticipated discharge in approximately 2 day(s).   Jamie Nickel, MD Internal Medicine PGY-1 Pager [phone]  After 5pm on weekdays and 1pm on weekends: On Call pager [phone]

## 2022-05-29 DIAGNOSIS — Z515 Encounter for palliative care: Secondary | ICD-10-CM

## 2022-05-29 DIAGNOSIS — Z66 Do not resuscitate: Secondary | ICD-10-CM

## 2022-05-29 DIAGNOSIS — E870 Hyperosmolality and hypernatremia: Secondary | ICD-10-CM | POA: Diagnosis present

## 2022-05-29 LAB — BASIC METABOLIC PANEL
Anion gap: 12 (ref 5–15)
BUN: 20 mg/dL (ref 8–23)
CO2: 33 mmol/L — ABNORMAL HIGH (ref 22–32)
Calcium: 10.3 mg/dL (ref 8.9–10.3)
Chloride: 102 mmol/L (ref 98–111)
Creatinine, Ser: 0.9 mg/dL (ref 0.44–1.00)
GFR, Estimated: >60 mL/min (ref >60)
Glucose, Bld: 94 mg/dL (ref 70–99)
Potassium: 3.9 mmol/L (ref 3.5–5.1)
Sodium: 147 mmol/L — ABNORMAL HIGH (ref 135–145)

## 2022-05-29 LAB — GLUCOSE, CAPILLARY: Glucose-Capillary: 80 mg/dL (ref 70–99)

## 2022-05-29 MED ORDER — ORAL CARE MOUTH RINSE: 15.0000 mL | OROMUCOSAL | Status: DC | PRN

## 2022-05-29 MED ORDER — ORAL CARE MOUTH RINSE
15.0000 mL | OROMUCOSAL | Status: DC
  Administered 2022-05-29 – 2022-05-31 (×11): 15 mL via OROMUCOSAL

## 2022-05-29 NOTE — Progress Notes (Signed)
  Date: 05/29/2022  Patient name: Jamie [name]  Medical record number: [id number]  Date of birth: [birth year]        I have seen and evaluated this patient and I have discussed the plan of care with the house staff. Please see their note for complete details. I concur with their findings with the following additions/corrections: [name] appears comfortable we used a [place] translator to update her however she is not following commands and I do not think that she retained much.  Overall her hypernatremia slightly improved with our D5W boluses we do not want to be too aggressive given her heart failure with severe aortic stenosis.  I think she is at a tenuous stable point right now.  We appreciate palliative care.  Overall I feel that she has severe dementia with heart failure and severe aortic stenosis that is not able to be treated even with TAVR.  She will continue to decline and we have recommended to the legal guardian that comfort measures is appropriate here.  While legal guardian is decided that we will maintain our current treatment try to keep hypernatremia at [name] with intermittent D5W encourage oral intake and try to maintain a volume balance.  Lucious Groves, DO 05/29/2022, 3:19 PM

## 2022-05-29 NOTE — Consult Note (Signed)
Consultation Note Date: 05/29/2022   Patient Name: Jamie [name]  DOB: 05/08/1930  MRN: [id number]  Age / Sex: 88 y.o., female  PCP: Megan Mans, NP Referring Physician: Lucious Groves, DO  Reason for Consultation: Establishing goals of care  HPI/Patient Profile: 88 y.o. female   admitted on 05/25/2022 with past medical history of HFpEF, anxiety, aortic stenosis, chronic pain, dementia, hypertension, and hyperlipidemia who was brought to the ED by assisted living staff for shortness of breath and acute functional decline.   She was admitted for acute hypoxic metabolic encephalopathy.    H/O multiple falls.  Resident of [place], has guardianship with [facility].  Pending decisions regarding treatment options,advanced directives, and anticipatory care needs.    Clinical Assessment and Goals of Care:  This NP Wadie Lessen reviewed medical records, received report from team, assessed the patient ( phone interpretor utilized)and then meet at the patient's bedside along with guardian [name] Weeks  to discuss diagnosis, prognosis, GOC, EOL wishes disposition and options.   Concept of Palliative Care was introduced as specialized medical care for people and their families living with serious illness.  If focuses on providing relief from the symptoms and stress of a serious illness.  The goal is to improve quality of life for the patient.  Education offered on current medical situation specific to ES-dementia and adult failure to thrive.      A  discussion was had today regarding advanced directives.  Concepts specific to code status, artifical feeding and hydration, continued IV antibiotics and rehospitalization was had.    The difference between a aggressive medical intervention path  and a palliative comfort care path for this patient at this time was had.     MOST form introduced   Natural trajectory and  expectations at EOL were discussed.   Education offered on hospice benefit; philosophy and eligibility.   Questions and concerns addressed.    Guardian request summary of current medical situation emailed today for supervisor review before decisions in next steps.  PMT will continue to support holistically.  Late entry 1630     I spoke again to [facility] guardian and plan is to move forward with a comfort path allowing for a natural death. Patient to return back to facility with hospice in place.  Unable to complete MOST form before discharge.  Please have hospice complete at facility.           Patient has a legal guardian/Nicole Weeks  [phone]       SUMMARY OF RECOMMENDATIONS    Code Status/Advance Care Planning: DNR   Palliative Prophylaxis:  Aspiration, Frequent Pain Assessment, and Oral Care  Additional Recommendations (Limitations, Scope, Preferences): Full Comfort Care  Psycho-social/Spiritual:  Desire for further Chaplaincy support:no Additional Recommendations: Education on Hospice  Prognosis:  < 6 months  Discharge Planning: [place] with Hospice      Primary Diagnoses: Present on Admission:  Acute encephalopathy  AKI (acute kidney injury) ([place])  Dementia with behavioral disturbance ([place])  Essential  hypertension   I have reviewed the medical record, interviewed the patient and family, and examined the patient. The following aspects are pertinent.  Past Medical History:  Diagnosis Date   Anxiety    Chronic pain    Dementia ([place])    Depression    Gastric ulcer 05/2011   Hyperlipidemia    Hypertension    Obesity    5'3"   Social History   Socioeconomic History   Marital status: Divorced    Spouse name: Not on file   Number of children: Not on file   Years of education: Not on file   Highest education level: Not on file  Occupational History   Not on file  Tobacco Use   Smoking status: Former     Packs/day: 1.00    Types: Cigarettes    Quit date: 09/02/1971    Years since quitting: 50.7   Smokeless tobacco: Never  Vaping Use   Vaping Use: Never used  Substance and Sexual Activity   Alcohol use: Not Currently    Comment: occasionally   Drug use: No   Sexual activity: Not Currently  Other Topics Concern   Not on file  Social History Narrative   Lives in a townhouse with steps.  Lives alone.  Ambulates without assist device.  Drives, pays own bills, and completes ADLs without assistance.  Pt. Current/past profession-Concierge. Pt. Does exercise (walk) 1 X Day. Pt. Does have a living will. Pt. Do have a DNR form. And also Pt. Have signed POA/HPOA forms.      Neighbor, [name], is her emergency contact:  [phone]   Full code                  Social Determinants of Health   Financial Resource Strain: Not on file  Food Insecurity: Not on file  Transportation Needs: Not on file  Physical Activity: Not on file  Stress: Not on file  Social Connections: Not on file   Family History  Problem Relation Age of Onset   Hypertension Mother    Hyperlipidemia Mother    Heart disease Father        70s   Cancer Brother    Scheduled Meds:  enoxaparin (LOVENOX) injection  40 mg Subcutaneous Q24H   sertraline  50 mg Oral Daily   Continuous Infusions: PRN Meds:.acetaminophen **OR** acetaminophen, iohexol Medications Prior to Admission:  Prior to Admission medications   Medication Sig Start Date End Date Taking? Authorizing Provider  acetaminophen (TYLENOL) 500 MG tablet Take 500 mg by mouth every 8 (eight) hours as needed (pain).   Yes [provider]  ARIPiprazole (ABILIFY) 5 MG tablet Take 1 tablet (5 mg total) by mouth at bedtime. 06/24/15  Yes Charlynne Cousins, MD  busPIRone (BUSPAR) 5 MG tablet Take 5 mg by mouth 2 (two) times daily.    Yes [provider]  calcium carbonate (OS-CAL - DOSED IN MG OF ELEMENTAL CALCIUM) 1250 (500 Ca) MG tablet Take 1 tablet  by mouth 2 (two) times daily with a meal.   Yes [provider]  Cholecalciferol (VITAMIN D3) 50 MCG (2000 UT) TABS Take 2,000 Units by mouth daily.   Yes [provider]  gabapentin (NEURONTIN) 100 MG capsule Take 1 capsule (100 mg total) by mouth at bedtime. 06/24/15  Yes Charlynne Cousins, MD  magnesium oxide (MAG-OX) 400 MG tablet Take 400 mg by mouth daily.   Yes [provider]  Melatonin 1 MG TABS Take 1  mg by mouth at bedtime.    Yes [provider]  OVER THE COUNTER MEDICATION Place 1 drop into both eyes in the morning, at noon, and at bedtime. Tears lubricant 0.5 % eye drops   Yes [provider]  oxymetazoline (AFRIN) 0.05 % nasal spray Place 2 sprays into both nostrils daily as needed (Nose Bleed).   Yes [provider]  [place] (OYSTER CALCIUM) 500 MG TABS tablet Take 500 mg of elemental calcium by mouth 2 (two) times daily.   Yes [provider]  pantoprazole (PROTONIX) 40 MG tablet Take 1 tablet (40 mg total) by mouth 2 (two) times daily before a meal. Patient taking differently: Take 40 mg by mouth daily. 06/24/15  Yes Charlynne Cousins, MD  Polyethylene Glycol 3350 POWD Take 17 g by mouth daily.   Yes [provider]  senna (SENNA-TIME) 8.6 MG tablet Take 8.6 mg by mouth at bedtime.   Yes [provider]  sertraline (ZOLOFT) 50 MG tablet Take 1 tablet (50 mg total) by mouth daily. 06/24/15  Yes Charlynne Cousins, MD  sodium chloride (DEEP SEA NASAL SPRAY) 0.65 % nasal spray Place 2 sprays into the nose 2 (two) times daily.   Yes [provider]  Zinc Oxide (BAZA PROTECT MOISTURE BARRIER) 12 % CREA Apply 1 Application topically daily as needed (redness or irritation apply to peri).   Yes [provider]   Allergies  Allergen Reactions   Fluticasone Other (See Comments)    Unknown reaction, listed as an allergen on MAR. Not listed on MAR   Pollen Extract Other (See  Comments)    Swollen Eyes & Runny Nose. Not listed on MAR   Review of Systems  Unable to perform ROS: Dementia    Physical Exam Cardiovascular:     Rate and Rhythm: Normal rate.  Pulmonary:     Effort: Pulmonary effort is normal.  Skin:    General: Skin is warm and dry.  Neurological:     Mental Status: She is alert.  Psychiatric:        Speech: She is noncommunicative.        Cognition and Memory: Cognition is impaired.     Vital Signs: BP 138/81 (BP Location: Left Arm)   Pulse 92   Temp (!) 97.5 F (36.4 C) (Axillary)   Resp 18   Wt 53.3 kg   SpO2 98%   BMI 22.95 kg/m  Pain Scale: Faces   Pain Score: 0-No pain   SpO2: SpO2: 98 % O2 Device:SpO2: 98 % O2 Flow Rate: .O2 Flow Rate (L/min): 2 L/min  IO: Intake/output summary:  Intake/Output Summary (Last 24 hours) at 05/29/2022 1034 Last data filed at 05/28/2022 1900 Gross per 24 hour  Intake 989.94 ml  Output 600 ml  Net 389.94 ml    LBM: Last BM Date :  (PTA) Baseline Weight: Weight: 52.5 kg Most recent weight: Weight: 53.3 kg     Palliative Assessment/Data:30 %   Discussed with treatment team  Time In: 1100 Time Out: 1220 Time Total: 80 minutes Greater than 50%  of this time was spent counseling and coordinating care related to the above assessment and plan.  Signed by: Wadie Lessen, NP   Please contact Palliative Medicine Team phone at [phone] for questions and concerns.  For individual provider: See Shea Evans

## 2022-05-29 NOTE — Progress Notes (Addendum)
Subjective:  Patient opened her eyes spontaneously this morning when IM team entered the room. Reason for hospitalization and plan for comfort care communicated to patient in [place] via phone interpretor. She remains non-verbal and followed a few commands, though only inconsistently.   Objective:  Vitals over previous 24hr: Vitals:   05/28/22 2300 05/29/22 0320 05/29/22 0500 05/29/22 0800  BP: 106/73 (!) 151/79  138/81  Pulse: 65 87  92  Resp: 18 20  18   Temp: 97.9 F (36.6 C) 97.9 F (36.6 C)  (!) 97.5 F (36.4 C)  TempSrc: Axillary Axillary  Axillary  SpO2: 99% 98%  98%  Weight:   53.3 kg     General:      drowsy but arousable to soft verbal stimuli, lying comfortably in bed, not in acute distress, unable to consistently follow commands Lungs:      normal respiratory effort, breathing unlabored, symmetrical chest rise Cardiac:      regular rate and rhythm, normal S1 and S2, loud systolic crescendo-decrescendo murmur Abdomen:      soft and non-distended, no tenderness to palpation or guarding   Assessment/Plan: Ms Petti is a 88 year old female with a past medical history of HFpEF, anxiety, aortic stenosis, chronic pain, dementia, hypertension, and hyperlipidemia who was brought to the ED by assisted living staff for shortness of breath and acute functional decline. She was admitted for acute hypoxic metabolic encephalopathy.   ---Acute hypoxic metabolic encephalopathy Assisted living staff at [facility] report three days of decreased function and breath shortness with low oxygen saturation levels. Head CT negative. Chest CTA revealed cardiogenic pulmonary edema, dilated main pulmonary artery, and bilateral pleural effusions. Infectious and toxicology workup was negative. Etiology most likely hypoxia secondary to acute heart failure exacerbation and severe aortic stenosis. Given that patient takes multiple centrally-acting medications at home, polypharmacy is  a possible contributing factor as well. Given severity of chronic conditions that will affect life quality, patient will likely benefit from comfort care. Palliative met with legal guardian earlier today 9-28 who is considering a transition to comfort care. > Consult palliative care, appreciate recommendations > Monitor clinically for cognitive and physical improvement > Neurochecks q4 > Follow blood cultures, no growth at 4d > Deliver oxygen to maintain goal SpO2 >92% > Fall precautions > Hold aripiprazole and buspirone   ---Acute on chronic heart failure with preserved ejection fraction Patient has history of chronic heart failure with mildly reduced ejection fraction identified on echocardiogram performed 2016. Most recent echocardiogram 9-25 revealed normal left ventricular 60-65% ejection fraction, mitral annular calcification, and severe aortic stenosis calcification. Chest CT revealed bilateral pleural effusions and BNP 4250. On exam, crackles were heard in bilateral lung bases. These findings are consistent with an acute heart failure exacerbation. One dose of furosemide was administered 9-24, but further diuresis withheld given subsequent elevations in bicarbonate and sodium. > Daily weights > Strict ins-outs   ---Hypernatremia On 9-25, laboratory testing revealed elevated sodium. Managed conservatively with intermittent D5 bolus, hesitant to use large volumes given heart failure exacerbation. > Trend CMP > Intravenous D5 bolus as needed for Na >148   ---Elevated creatinine Since admission, creatinine has been above baseline of 0.9 to 1.0. Likely prerenal given persistent elevation despite diuretic administration. Has been down-trending and is now 9-27 close to baseline. > Trend BMP   ---Radial head fracture Right arm radiograph revealed nondisplaced fracture of radial head, likely secondary to recent fall. > Splint   ---Anxiety ---Depression Patient takes alprazolam,  aripiprazole, buspirone, and sertraline at home. The IM treatment team has been holding these medications due to concern for worsening acute encephalopathy. > Sertraline 47m q24 > Hold aripiprazole and buspirone     Principal Problem:   Acute encephalopathy Active Problems:   Essential hypertension   Dementia with behavioral disturbance (HCC)   [place]   Aortic stenosis   Acute on chronic congestive heart failure ([place]   Prior to Admission Living Arrangement: Anticipated Discharge Location: Barriers to Discharge: Dispo: Anticipated discharge in approximately 2 day(s).   DRoswell Nickel MD Internal Medicine PGY-1 Pager [phone] After 5pm on weekdays and 1pm on weekends: On Call pager [phone]

## 2022-05-29 NOTE — Progress Notes (Signed)
Physical Therapy Treatment Patient Details Name: Jamie [name] MRN: [id number] DOB: 12-Jun-1930 Today's Date: 05/29/2022   History of Present Illness 88 yo female [facility] SNF with ShOB, physically declining.  chest x-ray showed bilateral pleural effusions.  Initial right elbow x-ray showing nondisplaced radial head fracture.  CT chest showed bilateral dependent pleural effusions.  CT head and neck negative for any intracranial abnormalities or any acute cervical spine fractures. PMH includes HFrEF with most recent echocardiogram on 10/21 with ejection fraction of 45 to 50%, anxiety, aortic stenosis, chronic pain, dementia, HTN, and HLD.    PT Comments    Pt brightens and attends to PT when [place] audio interpreter utilized during session. Pt nodding yes/no consistently and correctly during session, once states "no" when PT asking pt to stand for a third time for linen change. Pt overall requiring mod-max physical assist for bed mobility and transfer-level mobility, VSS throughout session. PT to continue to follow.      Recommendations for follow up therapy are one component of a multi-disciplinary discharge planning process, led by the attending physician.  Recommendations may be updated based on patient status, additional functional criteria and insurance authorization.  Follow Up Recommendations  Skilled nursing-short term rehab (<3 hours/day) Can patient physically be transported by private vehicle: No   Assistance Recommended at Discharge Frequent or constant Supervision/Assistance  Patient can return home with the following A lot of help with walking and/or transfers;A lot of help with bathing/dressing/bathroom;Assistance with cooking/housework;Direct supervision/assist for financial management;Assist for transportation;Help with stairs or ramp for entrance   Equipment Recommendations  None recommended by PT    Recommendations for Other Services       Precautions /  Restrictions Precautions Precautions: Fall Precaution Comments: RUE radial head fx Required Braces or Orthoses: Sling (not donned - unsure of location of sling and pt resting with RUE propped) Restrictions Weight Bearing Restrictions: No     Mobility  Bed Mobility Overal bed mobility: Needs Assistance Bed Mobility: Supine to Sit, Sit to Supine     Supine to sit: Max assist Sit to supine: Max assist   General bed mobility comments: assist for trunk and LE management, moved to and from EOB x2    Transfers Overall transfer level: Needs assistance Equipment used: 1 person hand held assist Transfers: Sit to/from Stand Sit to Stand: Mod assist           General transfer comment: mod, evolving to max assist for power up, rise, steadying, and correcting posterior bias. x2 steps towards HOB during 2 of the stands, fatigues quickly. STS x3    Ambulation/Gait               General Gait Details: unable at this time   Stairs             Wheelchair Mobility    Modified Rankin (Stroke Patients Only)       Balance Overall balance assessment: Needs assistance Sitting-balance support: No upper extremity supported, Feet supported Sitting balance-Leahy Scale: Fair Sitting balance - Comments: able to sit EOB without PT assist   Standing balance support: Single extremity supported, During functional activity Standing balance-Leahy Scale: Poor Standing balance comment: reliant on PT assist                            Cognition Arousal/Alertness: Lethargic Behavior During Therapy: Flat affect Overall Cognitive Status: No family/caregiver present to determine baseline cognitive  functioning                                 General Comments: nods yes/no consistently during session, [place] audio interpreter used via green Claudie Revering [phone]        Exercises      General Comments General comments (skin integrity, edema, etc.): vss on  2LO2      Pertinent Vitals/Pain Pain Assessment Pain Assessment: Faces Faces Pain Scale: Hurts little more Pain Location: generalized Pain Descriptors / Indicators: Grimacing, Guarding Pain Intervention(s): Limited activity within patient's tolerance, Monitored during session, Repositioned    Home Living                          Prior Function            PT Goals (current goals can now be found in the care plan section) Acute Rehab PT Goals PT Goal Formulation: With patient Time For Goal Achievement: 06/09/22 Potential to Achieve Goals: Good Progress towards PT goals: Progressing toward goals    Frequency    Min 2X/week      PT Plan Current plan remains appropriate    Co-evaluation              AM-PAC PT "6 Clicks" Mobility   Outcome Measure  Help needed turning from your back to your side while in a flat bed without using bedrails?: A Lot Help needed moving from lying on your back to sitting on the side of a flat bed without using bedrails?: A Lot Help needed moving to and from a bed to a chair (including a wheelchair)?: A Lot Help needed standing up from a chair using your arms (e.g., wheelchair or bedside chair)?: A Lot Help needed to walk in hospital room?: A Lot Help needed climbing 3-5 steps with a railing? : Total 6 Click Score: 11    End of Session Equipment Utilized During Treatment: Oxygen Activity Tolerance: Patient tolerated treatment well;Patient limited by fatigue Patient left: in bed;with call bell/phone within reach;with bed alarm set Nurse Communication: Mobility status (NT present at end of session to assist with linen change) PT Visit Diagnosis: Other abnormalities of gait and mobility (R26.89);Muscle weakness (generalized) (M62.81)     Time: 1150-1209 PT Time Calculation (min) (ACUTE ONLY): 19 min  Charges:  $Therapeutic Activity: 8-22 mins                     Stacie Glaze, PT DPT Acute Rehabilitation Services Pager  [phone]  Office [phone]   Roxine Caddy E Ruffin Pyo 05/29/2022, 12:42 PM

## 2022-05-29 NOTE — TOC Progression Note (Signed)
Transition of Care [facility]) - Progression Note    Patient Details  Name: LANETA GUERIN MRN: [id number] Date of Birth: 1930-01-06  Transition of [facility]) CM/SW [place], LCSW Phone Number: 05/29/2022, 4:43 PM  Clinical Narrative:    CSW received consult for hospice at [facility]. CSW spoke with patient's guardian, [name]. She is in agreement with plan and stated that if any paperwork is needed, she is off tomorrow but her coworker will be covering [facility] [phone]). CSW spoke with Hydia at [facility] and she requested [place] with a hospital bed, oxygen, wheelchair, and bedside table. Fl2 to be faxed to [phone].  CSW sent referral to [facility] for review. DNR will need to be signed on chart.    Expected Discharge Plan: Assisted Living Barriers to Discharge: Continued Medical Work up  Expected Discharge Plan and Services Expected Discharge Plan: Assisted Living In-house Referral: Clinical Social Work   Post Acute Care Choice: Resumption of Svcs/PTA Provider Living arrangements for the past 2 months: Assisted Living Facility                                       Social Determinants of Health (SDOH) Interventions    Readmission Risk Interventions     No data to display

## 2022-05-29 NOTE — Plan of Care (Signed)
  Problem: Health Behavior/Discharge Planning: Goal: Ability to manage health-related needs will improve Outcome: Progressing   Problem: Clinical Measurements: Goal: Ability to maintain clinical measurements within normal limits will improve Outcome: Progressing   Problem: Clinical Measurements: Goal: Will remain free from infection Outcome: Progressing   

## 2022-05-29 NOTE — Discharge Summary (Addendum)
Name: Jamie [name] MRN: [id number] DOB: Oct 13, 1929 88 y.o. PCP: Megan Mans, NP  Date of Admission: 05/25/2022 10:06 AM Date of Discharge: 05/31/2022 Attending Physician: Charise Killian, MD  Discharge Diagnosis: 1. Principal Problem:   Acute encephalopathy Active Problems:   Essential hypertension   Dementia with behavioral disturbance (HCC)   Acute respiratory failure with hypoxia (HCC)   AKI (acute kidney injury) ([place])   Severe aortic stenosis   Acute on chronic congestive heart failure (HCC)   Hypernatremia   Discharge Medications: Allergies as of 05/31/2022       Reactions   Fluticasone Other (See Comments)   Unknown reaction, listed as an allergen on MAR. Not listed on MAR   Pollen Extract Other (See Comments)   Swollen Eyes & Runny Nose. Not listed on [facility]        Medication List     STOP taking these medications    ARIPiprazole 5 MG tablet Commonly known as: ABILIFY   busPIRone 5 MG tablet Commonly known as: BUSPAR   calcium carbonate 1250 (500 Ca) MG tablet Commonly known as: OS-CAL - dosed in mg of elemental calcium   Deep Sea Nasal Spray 0.65 % nasal spray Generic drug: sodium chloride   gabapentin 100 MG capsule Commonly known as: NEURONTIN   magnesium oxide 400 MG tablet Commonly known as: MAG-OX   oxymetazoline 0.05 % nasal spray Commonly known as: AFRIN   oyster calcium 500 MG Tabs tablet   pantoprazole 40 MG tablet Commonly known as: Protonix   Vitamin D3 50 MCG (2000 UT) Tabs       TAKE these medications    acetaminophen 500 MG tablet Commonly known as: TYLENOL Take 500 mg by mouth every 8 (eight) hours as needed (pain).   Baza Protect Moisture Barrier 12 % Crea Generic drug: Zinc Oxide Apply 1 Application topically daily as needed (redness or irritation apply to peri).   melatonin 1 MG Tabs tablet Take 1 mg by mouth at bedtime.   OVER THE COUNTER MEDICATION Place 1 drop into both eyes in the morning, at noon, and at  bedtime. Tears lubricant 0.5 % eye drops   Polyethylene Glycol 3350 Powd Take 17 g by mouth daily.   Senna-Time 8.6 MG tablet Generic drug: senna Take 8.6 mg by mouth at bedtime.   sertraline 50 MG tablet Commonly known as: ZOLOFT Take 1 tablet (50 mg total) by mouth daily.        Disposition and follow-up:   Jamie [name] was discharged from [facility] in Stable condition.  At the hospital follow up visit please address:  1.   Severe aortic stenosis End-stage dementia Patient will be transition to hospice.  [facility] will be providing her care.  Patient requires 2L of nasal cannula oxygen supplement to maintain her O2 saturation   2.  Labs / imaging needed at time of follow-up: NA  3.  Pending labs/ test needing follow-up: NA  Follow-up Appointments:  [facility] Course by problem list: Jamie [name] is a 88 year old female with a past medical history of HFpEF, anxiety, aortic stenosis, chronic pain, dementia, hypertension, and hyperlipidemia who was brought to the ED by assisted living staff for shortness of breath and acute functional decline, admitted for acute hypoxic encephalopathy secondary to heart failure exacerbation in the setting of severe aortic stenosis..  Acute on chronic heart failure with preserved ejection fraction Patient presented to the hospital with 3 days of shortness of breath.  Oxygen status at the facility was 70s, corrected with 2 L nasal cannula. Chest CT revealed bilateral pleural effusions and BNP 4250.  Patient has history of chronic heart failure with mildly reduced ejection fraction identified on echocardiogram performed 2016. Most recent echocardiogram 9-25 revealed normal left ventricular 60-65% ejection fraction, mitral annular calcification, and severe aortic stenosis calcification.  Per cardiology, patient is not a candidate for TAVR/surgery.  Patient received 1 dose 40 mg IV Lasix on admission but could not tolerate any  further diuresis due to hypernatremia and contraction alkalosis.  Palliative care was consulted and patient was transitioned to hospice care.  Acute hypoxic encephalopathy Dementia [facility] staff notes that the patient has been declining for the past 3 days.  Patient has had poor oral intake for the past 3 days.  Normally, the patient walks around the facility, but for the last 3 days she has not been walking around.  CT head and neck was negative for any intracranial abnormalities or cervical spine fracture.  Initial UA showed trace leukocytes but urine culture was unremarkable.  Blood culture was obtained which was negative to date.  COVID was negative.  Mentation only improved slowly the next couple days.  We were not able to engage in meaningful conversation even with [place] interpreter.  Her p.o. intake was poor during this admission.  We believe there is a component end-stage dementia.  Our palliative team was consulted and patient was transitioned to hospice care.   Pertinent Labs, Studies, and Procedures:  CBC    Component Value Date/Time   WBC 7.4 05/26/2022 0648   RBC 4.44 05/26/2022 0648   HGB 12.8 05/26/2022 0648   HCT 40.3 05/26/2022 0648   PLT 279 05/26/2022 0648   MCV 90.8 05/26/2022 0648   MCH 28.8 05/26/2022 0648   MCHC 31.8 05/26/2022 0648   RDW 15.6 (H) 05/26/2022 0648   LYMPHSABS 0.9 05/25/2022 1037   MONOABS 0.6 05/25/2022 1037   EOSABS 0.0 05/25/2022 1037   BASOSABS 0.0 05/25/2022 1037      Latest Ref Rng & Units 05/29/2022    5:07 AM 05/28/2022    3:47 AM 05/27/2022    3:23 AM  CMP  Glucose 70 - 99 mg/dL 94  95  88   BUN 8 - 23 mg/dL _0 Creatinine 0.44 - 1.00 mg/dL 0.90  1.02  1.11   Sodium 135 - 145 mmol/L 147  149  150   Potassium 3.5 - 5.1 mmol/L 3.9  4.0  3.8   Chloride 98 - 111 mmol/L 102  106  107   CO2 22 - 32 mmol/L 33  33  33   Calcium 8.9 - 10.3 mg/dL 10.3  10.3  10.4    ECHOCARDIOGRAM COMPLETE  Result Date:  05/25/2022    ECHOCARDIOGRAM REPORT   Patient Name:   Jamie [name] Date of Exam: 05/25/2022 Medical Rec #:  [id number]   Height:       60.0 in Accession #:    [id number]  Weight:       142.0 lb Date of Birth:  [birth year]   BSA:          1.614 m Patient Age:    82 years    BP:           120/73 mmHg Patient Gender: F           HR:           86  bpm. Exam Location:  Inpatient Procedure: 2D Echo, Color Doppler and Cardiac Doppler Indications:     R94.31 Abnormal EKG  History:         Patient has prior history of Echocardiogram examinations, most                  recent 06/22/2015. Risk Factors:Hypertension and Dyslipidemia.  Sonographer:     Raquel Sarna Senior RDCS Referring Phys:  [id number] Cristie Hem Diagnosing Phys: Jenkins Rouge MD  Sonographer Comments: Technically difficult study, patient is not arousable and leaning rightward. IMPRESSIONS  1. Left ventricular ejection fraction, by estimation, is 60 to 65%. The left ventricle has normal function. The left ventricle has no regional wall motion abnormalities. There is moderate left ventricular hypertrophy. Left ventricular diastolic parameters are indeterminate.  2. Right ventricular systolic function is normal. The right ventricular size is normal. There is normal pulmonary artery systolic pressure.  3. Left atrial size was mildly dilated.  4. Severe MAC but no Jamie by PT1/2 MVA estimated 4 cm2. The mitral valve is degenerative. Mild mitral valve regurgitation. No evidence of mitral stenosis. Severe mitral annular calcification.  5. Patient has known severe AS since 2016 given age, DNR status and dementia not a candidate for TAVR/surgery. The aortic valve is normal in structure. There is severe calcifcation of the aortic valve. There is severe thickening of the aortic valve. Aortic valve regurgitation is mild. Critical AS.  6. The inferior vena cava is normal in size with greater than 50% respiratory variability, suggesting right atrial pressure of 3 mmHg. FINDINGS  Left  Ventricle: Left ventricular ejection fraction, by estimation, is 60 to 65%. The left ventricle has normal function. The left ventricle has no regional wall motion abnormalities. The left ventricular internal cavity size was normal in size. There is  moderate left ventricular hypertrophy. Left ventricular diastolic parameters are indeterminate. Right Ventricle: The right ventricular size is normal. No increase in right ventricular wall thickness. Right ventricular systolic function is normal. There is normal pulmonary artery systolic pressure. The tricuspid regurgitant velocity is 2.59 m/s, and  with an assumed right atrial pressure of 8 mmHg, the estimated right ventricular systolic pressure is 00.9 mmHg. Left Atrium: Left atrial size was mildly dilated. Right Atrium: Right atrial size was normal in size. Pericardium: There is no evidence of pericardial effusion. Mitral Valve: Severe MAC but no Jamie by PT1/2 MVA estimated 4 cm2. The mitral valve is degenerative in appearance. There is severe thickening of the mitral valve leaflet(s). There is severe calcification of the mitral valve leaflet(s). Severe mitral annular calcification. Mild mitral valve regurgitation. No evidence of mitral valve stenosis. MV peak gradient, 11.4 mmHg. The mean mitral valve gradient is 6.0 mmHg. Tricuspid Valve: The tricuspid valve is normal in structure. Tricuspid valve regurgitation is mild . No evidence of tricuspid stenosis. Aortic Valve: Patient has known severe AS since 2016 given age, DNR status and dementia not a candidate for TAVR/surgery. The aortic valve is normal in structure. There is severe calcifcation of the aortic valve. There is severe thickening of the aortic valve. Aortic valve regurgitation is mild. Critical AS. Aortic valve mean gradient measures 85.0 mmHg. Aortic valve peak gradient measures 139.7 mmHg. Aortic valve area, by VTI measures 0.29 cm. Pulmonic Valve: The pulmonic valve was normal in structure. Pulmonic  valve regurgitation is not visualized. No evidence of pulmonic stenosis. Aorta: The aortic root is normal in size and structure. Venous: The inferior vena cava is normal in size with  greater than 50% respiratory variability, suggesting right atrial pressure of 3 mmHg. IAS/Shunts: No atrial level shunt detected by color flow Doppler.  LEFT VENTRICLE PLAX 2D LVIDd:         3.90 cm     Diastology LVIDs:         3.10 cm     LV e' medial:    3.64 cm/s LV PW:         1.00 cm     LV E/e' medial:  47.3 LV IVS:        1.30 cm     LV e' lateral:   6.66 cm/s LVOT diam:     1.90 cm     LV E/e' lateral: 25.8 LV SV:         50 LV SV Index:   31 LVOT Area:     2.84 cm  LV Volumes (MOD) LV vol d, MOD A4C: 77.6 ml LV vol s, MOD A4C: 47.4 ml LV SV MOD A4C:     77.6 ml RIGHT VENTRICLE RV S prime:     9.79 cm/s TAPSE (M-mode): 1.8 cm LEFT ATRIUM              Index        RIGHT ATRIUM           Index LA diam:        4.10 cm  2.54 cm/m   RA Area:     12.60 cm LA Vol (A2C):   114.0 ml 70.65 ml/m  RA Volume:   28.70 ml  17.79 ml/m LA Vol (A4C):   118.0 ml 73.13 ml/m LA Biplane Vol: 114.0 ml 70.65 ml/m  AORTIC VALVE AV Area (Vmax):    0.34 cm AV Area (Vmean):   0.38 cm AV Area (VTI):     0.29 cm AV Vmax:           591.00 cm/s AV Vmean:          442.000 cm/s AV VTI:            1.710 m AV Peak Grad:      139.7 mmHg AV Mean Grad:      85.0 mmHg LVOT Vmax:         71.00 cm/s LVOT Vmean:        59.300 cm/s LVOT VTI:          0.175 m LVOT/AV VTI ratio: 0.10 MITRAL VALVE                TRICUSPID VALVE MV Area (PHT): 4.04 cm     TR Peak grad:   26.8 mmHg MV Area VTI:   1.33 cm     TR Vmax:        259.00 cm/s MV Peak grad:  11.4 mmHg MV Mean grad:  6.0 mmHg     SHUNTS MV Vmax:       1.69 m/s     Systemic VTI:  0.18 m MV Vmean:      114.0 cm/s   Systemic Diam: 1.90 cm MV Decel Time: 188 msec MV E velocity: 172.00 cm/s MV A velocity: 135.00 cm/s MV E/A ratio:  1.27 Jenkins Rouge MD Electronically signed by Jenkins Rouge MD Signature  Date/Time: 05/25/2022/4:04:57 PM    Final (Updated)    CT Angio Chest PE W/Cm &/Or Wo Cm  Result Date: 05/25/2022 CLINICAL DATA:  Pulmonary embolism (PE) suspected, unknown D-dimer. Hypoxia. EXAM: CT ANGIOGRAPHY CHEST WITH CONTRAST TECHNIQUE: Multidetector CT imaging of  the chest was performed using the standard protocol during bolus administration of intravenous contrast. Multiplanar CT image reconstructions and MIPs were obtained to evaluate the vascular anatomy. RADIATION DOSE REDUCTION: This exam was performed according to the departmental dose-optimization program which includes automated exposure control, adjustment of the mA and/or kV according to patient size and/or use of iterative reconstruction technique. CONTRAST:  67m OMNIPAQUE IOHEXOL 350 MG/ML SOLN COMPARISON:  Chest radiograph from earlier today. FINDINGS: Cardiovascular: The study is moderate quality for the evaluation of pulmonary embolism, with significant motion degradation. There are no filling defects in the central, lobar, segmental or subsegmental pulmonary artery branches to suggest acute pulmonary embolism. Atherosclerotic thoracic aorta with dilated 4.1 cm ascending thoracic aorta. Dilated main pulmonary artery (4.0 cm diameter). Mild cardiomegaly. No significant pericardial fluid/thickening. Diffuse thickening and coarse calcification of the aortic valve. Three-vessel coronary atherosclerosis. Mediastinum/Nodes: No significant thyroid nodules. Unremarkable esophagus. No pathologically enlarged axillary, mediastinal or hilar lymph nodes. Lungs/Pleura: No pneumothorax. Moderate dependent bilateral pleural effusions. Moderate passive atelectasis in the dependent lower lobes bilaterally. Diffuse interlobular septal thickening. No lung masses or significant pulmonary nodules in the aerated portions of the lungs. Upper abdomen: Large hiatal hernia. Simple 2.9 cm left liver dome cyst. Nonobstructing 11 mm upper right renal stone.  Musculoskeletal: No aggressive appearing focal osseous lesions. Moderate thoracic spondylosis. Review of the MIP images confirms the above findings. IMPRESSION: 1. Motion degraded scan. No evidence of acute pulmonary embolism. 2. Cardiomegaly. Diffuse interlobular septal thickening compatible with cardiogenic pulmonary edema. 3. Moderate dependent bilateral pleural effusions. Moderate passive atelectasis in the dependent lower lobes bilaterally. 4. Dilated main pulmonary artery, suggesting pulmonary arterial hypertension. 5. Large hiatal hernia. 6. Nonobstructing right nephrolithiasis. 7. Aortic Atherosclerosis (ICD10-I70.0). Electronically Signed   By: JIlona SorrelM.D.   On: 05/25/2022 13:19   CT Head Wo Contrast  Result Date: 05/25/2022 CLINICAL DATA:  Pain after trauma EXAM: CT HEAD WITHOUT CONTRAST CT CERVICAL SPINE WITHOUT CONTRAST TECHNIQUE: Multidetector CT imaging of the head and cervical spine was performed following the standard protocol without intravenous contrast. Multiplanar CT image reconstructions of the cervical spine were also generated. RADIATION DOSE REDUCTION: This exam was performed according to the departmental dose-optimization program which includes automated exposure control, adjustment of the mA and/or kV according to patient size and/or use of iterative reconstruction technique. COMPARISON:  February 09, 2022 CT scan of the brain and cervical spine. FINDINGS: CT HEAD FINDINGS Brain: No subdural, epidural, or subarachnoid hemorrhage. White matter changes are stable. No acute cortical ischemia or infarct. No mass effect or midline shift. Ventricles and sulci are stable. Cerebellum, brainstem, and basal cisterns are within normal limits. Vascular: Calcified atherosclerotic change in the intracranial carotids. Skull: Normal. Negative for fracture or focal lesion. Sinuses/Orbits: Fluid in the posterior left mastoid air cells without bony erosion is a stable finding. Cerumen is identified in  both external auditory canals. The middle ears are well aerated. Other: None. CT CERVICAL SPINE FINDINGS Alignment: Normal. Skull base and vertebrae: No acute fracture. No primary bone lesion or focal pathologic process. Soft tissues and spinal canal: No prevertebral fluid or swelling. No visible canal hematoma. Disc levels: Multilevel degenerative disc disease and facet degenerative changes. Upper chest: Bilateral pleural effusions, left greater than right, are partially imaged. Other: No other abnormalities. IMPRESSION: 1. No acute intracranial abnormalities. 2. No fracture or traumatic malalignment in the cervical spine. Degenerative changes. 3. Bilateral pleural effusions. 4. Both external auditory canals are occluded with cerumen. Electronically Signed  By: Dorise Bullion III M.D.   On: 05/25/2022 12:46   CT Cervical Spine Wo Contrast  Result Date: 05/25/2022 CLINICAL DATA:  Pain after trauma EXAM: CT HEAD WITHOUT CONTRAST CT CERVICAL SPINE WITHOUT CONTRAST TECHNIQUE: Multidetector CT imaging of the head and cervical spine was performed following the standard protocol without intravenous contrast. Multiplanar CT image reconstructions of the cervical spine were also generated. RADIATION DOSE REDUCTION: This exam was performed according to the departmental dose-optimization program which includes automated exposure control, adjustment of the mA and/or kV according to patient size and/or use of iterative reconstruction technique. COMPARISON:  February 09, 2022 CT scan of the brain and cervical spine. FINDINGS: CT HEAD FINDINGS Brain: No subdural, epidural, or subarachnoid hemorrhage. White matter changes are stable. No acute cortical ischemia or infarct. No mass effect or midline shift. Ventricles and sulci are stable. Cerebellum, brainstem, and basal cisterns are within normal limits. Vascular: Calcified atherosclerotic change in the intracranial carotids. Skull: Normal. Negative for fracture or focal lesion.  Sinuses/Orbits: Fluid in the posterior left mastoid air cells without bony erosion is a stable finding. Cerumen is identified in both external auditory canals. The middle ears are well aerated. Other: None. CT CERVICAL SPINE FINDINGS Alignment: Normal. Skull base and vertebrae: No acute fracture. No primary bone lesion or focal pathologic process. Soft tissues and spinal canal: No prevertebral fluid or swelling. No visible canal hematoma. Disc levels: Multilevel degenerative disc disease and facet degenerative changes. Upper chest: Bilateral pleural effusions, left greater than right, are partially imaged. Other: No other abnormalities. IMPRESSION: 1. No acute intracranial abnormalities. 2. No fracture or traumatic malalignment in the cervical spine. Degenerative changes. 3. Bilateral pleural effusions. 4. Both external auditory canals are occluded with cerumen. Electronically Signed   By: Dorise Bullion III M.D.   On: 05/25/2022 12:46   DG Elbow Complete Right  Result Date: 05/25/2022 CLINICAL DATA:  Elbow pain and swelling. EXAM: RIGHT ELBOW - COMPLETE 3+ VIEW COMPARISON:  None Available. FINDINGS: Elevation of the anterior fat pad is consistent with an elbow joint effusion. Suboptimal evaluation due to nonstandard positioning, however possible nondisplaced radial head fracture is seen on the oblique view. Generalized osteopenia also noted. No evidence of dislocation. IMPRESSION: Elbow joint effusion. Suboptimal evaluation due to nonstandard positioning, however nondisplaced fracture of radial head is suspected. Electronically Signed   By: Marlaine Hind M.D.   On: 05/25/2022 12:08   DG Chest Port 1 View  Result Date: 05/25/2022 CLINICAL DATA:  Hypoxia. EXAM: PORTABLE CHEST 1 VIEW COMPARISON:  One-view chest x-ray 04/24/2015. FINDINGS: Patient is somewhat rotated to the right. The heart is enlarged. Moderate diffuse edema is present. Left greater than right pleural effusions are present. Bibasilar airspace  opacities likely reflect atelectasis. No significant consolidation is present in the upper lungs. Remote left-sided rib fractures are noted. IMPRESSION: 1. Cardiomegaly with moderate diffuse edema and bilateral pleural effusions compatible with congestive heart failure. 2. Bibasilar airspace disease likely reflects atelectasis. Electronically Signed   By: San Morelle M.D.   On: 05/25/2022 12:02     Discharge Instructions: Discharge Instructions     Call MD for:  persistant nausea and vomiting   Complete by: As directed    Call MD for:  redness, tenderness, or signs of infection (pain, swelling, redness, odor or green/yellow discharge around incision site)   Complete by: As directed    Call MD for:  severe uncontrolled pain   Complete by: As directed    Call MD for:  temperature >100.4   Complete by: As directed    Diet - low sodium heart healthy   Complete by: As directed    Discharge instructions   Complete by: As directed    Jamie [name],  It was a pleasure taking care of you during this admission.  You were hospitalized for heart failure exacerbation from stiffness of your aortic valve.  We are unable to fix this valve surgically due to your medical conditions.   You will be transitioned back to your facility with hospice service.    Take care,  Dr. Alfonse Spruce   Increase activity slowly   Complete by: As directed    No wound care   Complete by: As directed        Signed: Gaylan Gerold, DO Internal Medicine Residency My pager: [phone]

## 2022-05-30 DIAGNOSIS — I5033 Acute on chronic diastolic (congestive) heart failure: Secondary | ICD-10-CM

## 2022-05-30 LAB — CULTURE, BLOOD (ROUTINE X 2)
Culture: NO GROWTH
Culture: NO GROWTH
Special Requests: ADEQUATE

## 2022-05-30 NOTE — TOC Progression Note (Addendum)
Transition of Care [facility]) - Progression Note    Patient Details  Name: Jamie [name] MRN: [id number] Date of Birth: [birth year]  Transition of Care [facility]) CM/SW [place], LCSW Phone Number: 05/30/2022, 2:56 PM  Clinical Narrative:    CSW updated [facility] that patient's DME is still pending and patient will likely not discharge until the weekend.   CSW contacted Barbarann Ehlers [phone] with DSS and she stated she is currently at her desk. CSW made Hospice aware that now is a good time to catch her.    Expected Discharge Plan: Assisted Living Barriers to Discharge: Continued Medical Work up  Expected Discharge Plan and Services Expected Discharge Plan: Assisted Living In-house Referral: Clinical Social Work   Post Acute Care Choice: Resumption of Svcs/PTA Provider Living arrangements for the past 2 months: Assisted Living Facility                                       Social Determinants of Health (SDOH) Interventions    Readmission Risk Interventions     No data to display

## 2022-05-30 NOTE — Plan of Care (Signed)
  Problem: Education: Goal: Knowledge of General Education information will improve Description: Including pain rating scale, medication(s)/side effects and non-pharmacologic comfort measures Outcome: Progressing   Problem: Education: Goal: Knowledge of General Education information will improve Description: Including pain rating scale, medication(s)/side effects and non-pharmacologic comfort measures Outcome: Progressing   Problem: Health Behavior/Discharge Planning: Goal: Ability to manage health-related needs will improve Outcome: Progressing   Problem: Clinical Measurements: Goal: Will remain free from infection Outcome: Progressing

## 2022-05-30 NOTE — Progress Notes (Addendum)
Manufacturing [employer] [facility]) Hospital Liaison Note  Referral received for patient/family interest in hospice at [facility]). [place] liaison spoke with patient's guardian rep Dontay Woolard to confirm interest. Interest confirmed.   Hospice eligibility pending.   Plan is to discharge once DME has been delivered. Likely over the weekend.   DME needs: hospital bed, bedside table, wheelchair, and oxygen  Please send signed DNR with patient at discharge.   Please send comfort medications/prescriptions with patient at discharge.   Please call with any questions or concerns. Thank you  Roselee Nova, [place] [facility] Liaison [phone]

## 2022-05-30 NOTE — Progress Notes (Signed)
Subjective:  Patient was lying in bed with her eyes open this morning. She remains nonverbal and was unable to follow commands in [place] from phone interpretor.   Objective: Vitals over previous 24hr: Vitals:   05/29/22 1920 05/30/22 0000 05/30/22 0400 05/30/22 0800  BP: 113/69 115/69  (!) 82/42  Pulse: 79 79  76  Resp: '20 16 16 17  '$ Temp: 98.4 F (36.9 C) 98.9 F (37.2 C) 98.9 F (37.2 C)   TempSrc: Oral Oral Oral   SpO2: 97% 100%    Weight:        General:      drowsy but arousable to soft verbal stimuli, lying comfortably in bed, not in acute distress, unable to consistently follow commands Lungs:      normal respiratory effort, breathing unlabored, symmetrical chest rise Cardiac:      regular rate and rhythm, normal S1 and S2, loud systolic crescendo-decrescendo murmur Abdomen:      soft and non-distended, no tenderness to palpation or guarding   Assessment/Plan: Jamie [name] is a 88 year old female with a past medical history of HFpEF, anxiety, aortic stenosis, chronic pain, dementia, hypertension, and hyperlipidemia who was brought to the ED by assisted living staff for shortness of breath and acute functional decline. She was admitted for acute hypoxic metabolic encephalopathy.   ---Acute hypoxic metabolic encephalopathy Assisted living staff at [facility] report three days of decreased function and breath shortness with low oxygen saturation levels. Head CT negative. Chest CTA revealed cardiogenic pulmonary edema, dilated main pulmonary artery, and bilateral pleural effusions. Infectious and toxicology workup was negative. Etiology most likely hypoxia secondary to acute heart failure exacerbation and severe aortic stenosis. Given that patient takes multiple centrally-acting medications at home, polypharmacy is a possible contributing factor as well. Given severity of chronic conditions that will affect life quality, patient will likely benefit from comfort  care. Palliative team and legal guardian have both agreed on transition to comfort care. > Consult palliative care, appreciate recommendations > Comfort care > Deliver oxygen to maintain goal SpO2 >92% > Fall precautions > Hold aripiprazole and buspirone   ---Acute on chronic heart failure with preserved ejection fraction Patient has history of chronic heart failure with mildly reduced ejection fraction identified on echocardiogram performed 2016. Most recent echocardiogram 9-25 revealed normal left ventricular 60-65% ejection fraction, mitral annular calcification, and severe aortic stenosis calcification. Chest CT revealed bilateral pleural effusions and BNP 4250. On exam, crackles were heard in bilateral lung bases. These findings are consistent with an acute heart failure exacerbation. One dose of furosemide was administered 9-24, but further diuresis withheld given subsequent elevations in bicarbonate and sodium. > Comfort care   ---Hypernatremia On 9-25, laboratory testing revealed elevated sodium. Managed conservatively with intermittent D5 bolus, hesitant to use large volumes given heart failure exacerbation. > Comfort care   ---Elevated creatinine Since admission, creatinine has been above baseline of 0.9 to 1.0. Likely prerenal given persistent elevation despite diuretic administration. Has been down-trending and is now 9-27 close to baseline. > Comfort care   ---Radial head fracture Right arm radiograph revealed nondisplaced fracture of radial head, likely secondary to recent fall. > Splint   ---Anxiety ---Depression Patient takes alprazolam, aripiprazole, buspirone, and sertraline at home. The IM treatment team has been holding these medications due to concern for worsening acute encephalopathy. > Sertraline '50mg'$  q24 > Hold aripiprazole and buspirone     Principal Problem:   Acute encephalopathy Active Problems:   Essential hypertension  Dementia with behavioral  disturbance (HCC)   Acute respiratory failure with hypoxia (HCC)   AKI (acute kidney injury) ([place])   Severe aortic stenosis   Acute on chronic congestive heart failure ([place])   Hypernatremia   Prior to Admission Living Arrangement: Anticipated Discharge Location: Barriers to Discharge: Dispo: Anticipated discharge in approximately 2 day(s).   Roswell Nickel, MD Internal Medicine PGY-1 Pager [phone]  After 5pm on weekdays and 1pm on weekends: On Call pager [phone]

## 2022-05-30 NOTE — Care Management Important Message (Signed)
Important Message  Patient Details  Name: Jamie [name] MRN: [id number] Date of Birth: 19-Jul-1930   Medicare Important Message Given:  Yes     Kellene Mccleary Montine Circle 05/30/2022, 3:44 PM

## 2022-05-31 NOTE — Plan of Care (Signed)

## 2022-05-31 NOTE — Plan of Care (Signed)
  Problem: Education: Goal: Knowledge of General Education information will improve Description: Including pain rating scale, medication(s)/side effects and non-pharmacologic comfort measures 05/31/2022 1237 by Rosey Bath, RN Outcome: Completed/Met 05/31/2022 0934 by Rosey Bath, RN Outcome: Progressing   Problem: Health Behavior/Discharge Planning: Goal: Ability to manage health-related needs will improve 05/31/2022 1237 by Rosey Bath, RN Outcome: Completed/Met 05/31/2022 0934 by Rosey Bath, RN Outcome: Progressing   Problem: Clinical Measurements: Goal: Ability to maintain clinical measurements within normal limits will improve 05/31/2022 1237 by Rosey Bath, RN Outcome: Completed/Met 05/31/2022 0934 by Rosey Bath, RN Outcome: Progressing Goal: Will remain free from infection 05/31/2022 1237 by Rosey Bath, RN Outcome: Completed/Met 05/31/2022 0934 by Rosey Bath, RN Outcome: Progressing Goal: Diagnostic test results will improve 05/31/2022 1237 by Rosey Bath, RN Outcome: Completed/Met 05/31/2022 0934 by Rosey Bath, RN Outcome: Progressing Goal: Respiratory complications will improve 05/31/2022 1237 by Rosey Bath, RN Outcome: Completed/Met 05/31/2022 0934 by Rosey Bath, RN Outcome: Progressing Goal: Cardiovascular complication will be avoided 05/31/2022 1237 by Rosey Bath, RN Outcome: Completed/Met 05/31/2022 0934 by Rosey Bath, RN Outcome: Progressing   Problem: Activity: Goal: Risk for activity intolerance will decrease 05/31/2022 1237 by Rosey Bath, RN Outcome: Completed/Met 05/31/2022 0934 by Rosey Bath, RN Outcome: Progressing   Problem: Nutrition: Goal: Adequate nutrition will be maintained 05/31/2022 1237 by Rosey Bath, RN Outcome: Completed/Met 05/31/2022 0934 by Rosey Bath, RN Outcome: Progressing   Problem: Coping: Goal: Level of anxiety will decrease 05/31/2022  1237 by Rosey Bath, RN Outcome: Completed/Met 05/31/2022 0934 by Rosey Bath, RN Outcome: Progressing   Problem: Elimination: Goal: Will not experience complications related to bowel motility 05/31/2022 1237 by Rosey Bath, RN Outcome: Completed/Met 05/31/2022 0934 by Rosey Bath, RN Outcome: Progressing Goal: Will not experience complications related to urinary retention 05/31/2022 1237 by Rosey Bath, RN Outcome: Completed/Met 05/31/2022 0934 by Rosey Bath, RN Outcome: Progressing   Problem: Pain Managment: Goal: General experience of comfort will improve 05/31/2022 1237 by Rosey Bath, RN Outcome: Completed/Met 05/31/2022 0934 by Rosey Bath, RN Outcome: Progressing   Problem: Safety: Goal: Ability to remain free from injury will improve 05/31/2022 1237 by Rosey Bath, RN Outcome: Completed/Met 05/31/2022 0934 by Rosey Bath, RN Outcome: Progressing   Problem: Skin Integrity: Goal: Risk for impaired skin integrity will decrease 05/31/2022 1237 by Rosey Bath, RN Outcome: Completed/Met 05/31/2022 0934 by Rosey Bath, RN Outcome: Progressing

## 2022-05-31 NOTE — TOC Transition Note (Signed)
Transition of Care [facility]) - CM/SW Discharge Note   Patient Details  Name: Jamie [name] MRN: [id number] Date of Birth: 02/25/1930  Transition of Care [facility]) CM/SW Contact:  [address], [place] Phone Number: 05/31/2022, 12:56 PM   Clinical Narrative:     Patient will DC to: [facility] Anticipated DC date: 05/31/22 Family notified: No answer from legal guardian [name] Transport by: Corey Harold   Per MD patient ready for DC to [facility]. RN, patient, patient's family, and facility notified of DC. Discharge Summary and FL2 sent to facility. DC packet on chart. Ambulance transport requested for patient.   CSW will sign off for now as social work intervention is no longer needed. Please consult [place] again if new needs arise.      Barriers to Discharge: Continued Medical Work up   Patient Goals and CMS Choice Patient states their goals for this hospitalization and ongoing recovery are:: Feel better CMS Medicare.gov Compare Post Acute Care list provided to:: Legal Guardian Choice offered to / list presented to : [place] / [place]  Discharge Placement                       Discharge Plan and Services In-house Referral: Clinical Social Work   Post Acute Care Choice: Resumption of Svcs/PTA Provider                               Social Determinants of Health (SDOH) Interventions     Readmission Risk Interventions     No data to display

## 2022-05-31 NOTE — NC FL2 (Signed)
[place] LEVEL OF CARE SCREENING TOOL     IDENTIFICATION  Patient Name: Jamie [name] Birthdate: [birth year] Sex: female Admission Date (Current Location): 05/25/2022  [facility] and [place] Number:  Herbalist and Address:  [facility], [place] [address] [place], [place] [address]      Provider Number: [id number]  Attending Physician Name and Address:  Charise Killian, MD  Relative Name and Phone Number:       Current Level of Care: Hospital Recommended Level of Care: Assisted Living Facility Prior Approval Number:    Date Approved/Denied:   PASRR Number:    Discharge Plan: Other (Comment) (ALF)    Current Diagnoses: Patient Active Problem List   Diagnosis Date Noted   Hypernatremia 05/29/2022   Acute on chronic congestive heart failure ([place])    Acute encephalopathy 05/25/2022   Obstipation 04/30/2017   Severe aortic stenosis 06/22/2015   Hypotension 06/21/2015   Chest pain 06/21/2015   Anemia associated with acute blood loss    AKI (acute kidney injury) ([place])    Tachypnea    Hypoxia 04/18/2015   Acute respiratory failure with hypoxia and hypercapnia ([place]) 04/17/2015   Acute respiratory failure with hypoxia ([place]) 04/17/2015   HCAP (healthcare-associated pneumonia) 04/17/2015   Acute-on-chronic kidney injury ([place]) 04/17/2015   Anemia 04/17/2015   Sepsis ([place]) 04/17/2015   Cellulitis of leg, right 01/01/2015   Acute blood loss anemia 01/01/2015   Elevated troponin 01/01/2015   Hypokalemia 01/01/2015   Depression 01/01/2015   GI bleed 12/27/2014   Bleeding gastrointestinal    Hemorrhagic shock (HCC)    Alcohol dependence with uncomplicated withdrawal ([place])    Alcohol dependence ([place]) 09/30/2014   Dementia with behavioral disturbance ([place]) 09/29/2014   Delusions ([place]) 09/29/2014   Dementia ([place]) 12/13/2013   GI bleeding 08/12/2012   Obesity    Chronic pain    Gastric ulcer 05/03/2011   Bladder spasm 02/10/2011    Murmur, cardiac 11/20/2010   SHOULDER PAIN, RIGHT 04/15/2010   LOC OSTEOARTHROS NOT SPEC PRIM/SEC LOWER LEG 04/10/2010   CALF PAIN, RIGHT 03/01/2010   CERVICAL STRAIN, ACUTE 02/25/2010   ANXIETY STATE, UNSPECIFIED 06/20/2009   SEBACEOUS CYST, INFECTED 06/20/2009   ALLERGIC RHINITIS DUE TO OTHER ALLERGEN 11/17/2008   TACHYCARDIA 05/16/2008   CONTUSION OF BREAST 05/05/2008   HYPERCALCEMIA 01/20/2008   EPISTAXIS 12/30/2007   VITAMIN B12 DEFICIENCY 11/23/2007   APHTHOUS ULCERS 11/23/2007   ASYMPTOMATIC POSTMENOPAUSAL STATUS 08/18/2007   Essential hypertension 05/10/2007   ADVEF, DRUG/MEDICINAL/BIOLOGICAL SUBST NOS 05/10/2007   Hyperlipemia 02/23/2007    Orientation RESPIRATION BLADDER Height & Weight     Self  O2 (2L Nasal cannula) Continent Weight: 117 lb 8.1 oz (53.3 kg) Height:     BEHAVIORAL SYMPTOMS/MOOD NEUROLOGICAL BOWEL NUTRITION STATUS      Continent Diet (Regular)  AMBULATORY STATUS COMMUNICATION OF NEEDS Skin   Extensive Assist Verbally Other (Comment) (Skin tear on arm)                       Personal Care Assistance Level of Assistance  Bathing, Feeding, Dressing Bathing Assistance: Maximum assistance Feeding assistance: Maximum assistance Dressing Assistance: Maximum assistance     Functional Limitations Info  Sight Sight Info: Impaired        SPECIAL CARE FACTORS FREQUENCY                       Contractures Contractures Info: Not present  Additional Factors Info  Code Status, Allergies Code Status Info: DNR Allergies Info: Fluticasone, Pollen Extract           Current Medications (05/31/2022):  This is the current hospital active medication list    Discharge Medications: acetaminophen 500 MG tablet Commonly known as: TYLENOL Take 500 mg by mouth every 8 (eight) hours as needed (pain).    Baza Protect Moisture Barrier 12 % Crea Generic drug: Zinc Oxide Apply 1 Application topically daily as needed (redness or irritation apply  to peri).    melatonin 1 MG Tabs tablet Take 1 mg by mouth at bedtime.    OVER THE COUNTER MEDICATION Place 1 drop into both eyes in the morning, at noon, and at bedtime. Tears lubricant 0.5 % eye drops    Polyethylene Glycol 3350 Powd Take 17 g by mouth daily.    Senna-Time 8.6 MG tablet Generic drug: senna Take 8.6 mg by mouth at bedtime.    sertraline 50 MG tablet Commonly known as: ZOLOFT Take 1 tablet (50 mg total) by mouth daily.    Relevant Imaging Results:  Relevant Lab Results:   Additional Information SSN [id number]. Hospice to follow at ALF.  [place], LCSWA

## 2022-05-31 NOTE — Progress Notes (Addendum)
HD#4 Subjective:  Overnight Events: no event  Patient is seen at bedside.  She is resting comfortably and awake to stimulation.  Objective:  Vital signs in last 24 hours: Vitals:   05/30/22 1200 05/30/22 1600 05/30/22 1920 05/31/22 0850  BP: (!) 115/53 (!) 121/94 121/65 (!) 144/88  Pulse:   82 97  Resp:  '17 18 18  '$ Temp:  98 F (36.7 C) 98 F (36.7 C) 97.8 F (36.6 C)  TempSrc:  Oral Oral Oral  SpO2:   95% 97%  Weight:       Supplemental O2: Nasal Cannula SpO2: 95 % O2 Flow Rate (L/min): 2 L/min   Physical Exam:  Physical Exam Constitutional:      General: She is not in acute distress. HENT:     Head: Normocephalic.  Eyes:     General:        Right eye: No discharge.        Left eye: No discharge.     Conjunctiva/sclera: Conjunctivae normal.  Cardiovascular:     Rate and Rhythm: Normal rate and regular rhythm.  Pulmonary:     Effort: Pulmonary effort is normal. No respiratory distress.  Abdominal:     General: Bowel sounds are normal.     Palpations: Abdomen is soft.  Musculoskeletal:     Right lower leg: No edema.     Left lower leg: No edema.  Skin:    General: Skin is warm.     Filed Weights   05/28/22 0500 05/29/22 0500  Weight: 52.5 kg 53.3 kg    No intake or output data in the 24 hours ending 05/31/22 0713 Net IO Since Admission: 779.94 mL [05/31/22 0713]  Pertinent Labs:    Latest Ref Rng & Units 05/26/2022    6:48 AM 05/25/2022   11:17 AM 05/25/2022   10:37 AM  CBC  WBC 4.0 - 10.5 K/uL 7.4   8.1   Hemoglobin 12.0 - 15.0 g/dL 12.8  12.2  11.9   Hematocrit 36.0 - 46.0 % 40.3  36.0  39.1   Platelets 150 - 400 K/uL 279   295        Latest Ref Rng & Units 05/29/2022    5:07 AM 05/28/2022    3:47 AM 05/27/2022    3:23 AM  CMP  Glucose 70 - 99 mg/dL 94  95  88   BUN 8 - 23 mg/dL '20  24  24   '$ Creatinine 0.44 - 1.00 mg/dL 0.90  1.02  1.11   Sodium 135 - 145 mmol/L 147  149  150   Potassium 3.5 - 5.1 mmol/L 3.9  4.0  3.8   Chloride 98 -  111 mmol/L 102  106  107   CO2 22 - 32 mmol/L 33  33  33   Calcium 8.9 - 10.3 mg/dL 10.3  10.3  10.4     Imaging: No results found.  Assessment/Plan:   Principal Problem:   Acute encephalopathy Active Problems:   Essential hypertension   Dementia with behavioral disturbance (HCC)   Acute respiratory failure with hypoxia (HCC)   AKI (acute kidney injury) ([place])   Severe aortic stenosis   Acute on chronic congestive heart failure (HCC)   Hypernatremia   Patient Summary: Ms Brinton is a 88 year old female with a past medical history of HFpEF, anxiety, aortic stenosis, chronic pain, dementia, hypertension, and hyperlipidemia who was brought to the ED by assisted living staff for shortness of breath and acute  functional decline. She was admitted for acute hypoxic encephalopathy 2/2 AoC heart failure exacerbation secondary to severe aortic stenosis.   Comfort measure Her severe aortic stenosis and dementia are both end-stage and life limiting.  Appreciate palliative care assistance on this case comfort measures and transition.  Pending DME to be delivered to her facility for hospice care.  -Continue comfort measures  Diet: Normal IVF: None,None VTE: None Code: DNR PT/OT recs: None, none. TOC recs: pending transition back to nursing facility  Dispo: Anticipated discharge to Skilled nursing facility in 2 days pending DME delivery.   Gaylan Gerold, DO 05/31/2022, 7:13 AM Pager: [phone]  Please contact the on call pager after 5 pm and on weekends at [phone].

## 2022-06-01 NOTE — Progress Notes (Signed)
Patient's belongings were returned, PTAR at bedside.

## 2022-07-02 DEATH — deceased
